# Patient Record
Sex: Male | Born: 1965 | Race: White | Hispanic: No | Marital: Single | Smoking: Current every day smoker
Health system: Southern US, Community
[De-identification: ages and names within clinical notes are randomized; demographics above are authoritative.]

## PROBLEM LIST (undated history)

## (undated) DIAGNOSIS — C801 Malignant (primary) neoplasm, unspecified: Secondary | ICD-10-CM

## (undated) DIAGNOSIS — M722 Plantar fascial fibromatosis: Secondary | ICD-10-CM

## (undated) HISTORY — DX: Plantar fascial fibromatosis: M72.2

---

## 1997-11-25 ENCOUNTER — Emergency Department (HOSPITAL_COMMUNITY): Admission: EM | Admit: 1997-11-25 | Discharge: 1997-11-25 | Payer: Self-pay

## 2002-07-10 ENCOUNTER — Encounter: Payer: Self-pay | Admitting: Emergency Medicine

## 2002-07-10 ENCOUNTER — Emergency Department (HOSPITAL_COMMUNITY): Admission: EM | Admit: 2002-07-10 | Discharge: 2002-07-10 | Payer: Self-pay | Admitting: Emergency Medicine

## 2013-04-29 ENCOUNTER — Encounter: Payer: Self-pay | Admitting: Family Medicine

## 2013-04-29 ENCOUNTER — Ambulatory Visit (INDEPENDENT_AMBULATORY_CARE_PROVIDER_SITE_OTHER): Payer: BC Managed Care – PPO | Admitting: Family Medicine

## 2013-04-29 ENCOUNTER — Encounter (INDEPENDENT_AMBULATORY_CARE_PROVIDER_SITE_OTHER): Payer: Self-pay

## 2013-04-29 VITALS — BP 122/76 | HR 68 | Temp 98.1°F | Ht 71.0 in | Wt 169.0 lb

## 2013-04-29 DIAGNOSIS — Z23 Encounter for immunization: Secondary | ICD-10-CM

## 2013-04-29 DIAGNOSIS — M79671 Pain in right foot: Secondary | ICD-10-CM

## 2013-04-29 DIAGNOSIS — Z0000 Encounter for general adult medical examination without abnormal findings: Secondary | ICD-10-CM

## 2013-04-29 DIAGNOSIS — L989 Disorder of the skin and subcutaneous tissue, unspecified: Secondary | ICD-10-CM

## 2013-04-29 LAB — POCT CBC
Granulocyte percent: 64.7 %G (ref 37–80)
HCT, POC: 43.7 % (ref 43.5–53.7)
Hemoglobin: 14.7 g/dL (ref 14.1–18.1)
Lymph, poc: 2.9 (ref 0.6–3.4)
MCH, POC: 30.5 pg (ref 27–31.2)
MCHC: 33.6 g/dL (ref 31.8–35.4)
MCV: 90.7 fL (ref 80–97)
MPV: 7.6 fL (ref 0–99.8)
POC Granulocyte: 5.8 (ref 2–6.9)
POC LYMPH PERCENT: 32.6 %L (ref 10–50)
Platelet Count, POC: 154 10*3/uL (ref 142–424)
RBC: 4.8 M/uL (ref 4.69–6.13)
RDW, POC: 13.2 %
WBC: 9 10*3/uL (ref 4.6–10.2)

## 2013-04-29 MED ORDER — NAPROXEN 500 MG PO TABS: 500.0000 mg | ORAL_TABLET | Freq: Two times a day (BID) | ORAL | Status: DC

## 2013-04-29 NOTE — Progress Notes (Signed)
Tolerated tdap without difficulty 

## 2013-04-29 NOTE — Patient Instructions (Signed)
Tetanus, Diphtheria (Td) Vaccine What You Need to Know WHY GET VACCINATED? Tetanus  and diphtheria are very serious diseases. They are rare in the [place] today, but people who do become infected often have severe complications. Td vaccine is used to protect adolescents and adults from both of these diseases. Both tetanus and diphtheria are infections caused by bacteria. Diphtheria spreads from person to person through coughing or sneezing. Tetanus-causing bacteria enter the body through cuts, scratches, or wounds. TETANUS (Lockjaw) causes painful muscle tightening and stiffness, usually all over the body.  It can lead to tightening of muscles in the head and neck so you can't open your mouth, swallow, or sometimes even breathe. Tetanus kills about 1 out of every 5 people who are infected. DIPHTHERIA can cause a thick coating to form in the back of the throat.  It can lead to breathing problems, paralysis, heart failure, and death. Before vaccines, the [place] saw as many as 200,000 cases a year of diphtheria and hundreds of cases of tetanus. Since vaccination began, cases of both diseases have dropped by about 99%. TD VACCINE Td vaccine can protect adolescents and adults from tetanus and diphtheria. Td is usually given as a booster dose every 10 years but it can also be given earlier after a severe and dirty wound or burn. Your doctor can give you more information. Td may safely be given at the same time as other vaccines. SOME PEOPLE SHOULD NOT GET THIS VACCINE  If you ever had a life-threatening allergic reaction after a dose of any tetanus or diphtheria containing vaccine, OR if you have a severe allergy to any part of this vaccine, you should not get Td. Tell your doctor if you have any severe allergies.  Talk to your doctor if you:  have epilepsy or another nervous system problem,  had severe pain or swelling after any vaccine containing diphtheria or tetanus,  ever had  Guillain Barr Syndrome (GBS),  aren't feeling well on the day the shot is scheduled. RISKS OF A VACCINE REACTION With a vaccine, like any medicine, there is a chance of side effects. These are usually mild and go away on their own. Serious side effects are also possible, but are very rare. Most people who get Td vaccine do not have any problems with it. Mild Problems  following Td (Did not interfere with activities)  Pain where the shot was given (about 8 people in 10)  Redness or swelling where the shot was given (about 1 person in 3)  Mild fever (about 1 person in 15)  Headache or Tiredness (uncommon) Moderate Problems following Td (Interfered with activities, but did not require medical attention)  Fever over 102 F (38.9 C) (rare) Severe Problems  following Td (Unable to perform usual activities; required medical attention)  Swelling, severe pain, bleeding, or redness in the arm where the shot was given (rare). Problems that could happen after any vaccine:  Brief fainting spells can happen after any medical procedure, including vaccination. Sitting or lying down for about 15 minutes can help prevent fainting, and injuries caused by a fall. Tell your doctor if you feel dizzy, or have vision changes or ringing in the ears.  Severe shoulder pain and reduced range of motion in the arm where a shot was given can happen, very rarely, after a vaccination.  Severe allergic reactions from a vaccine are very rare, estimated at less than 1 in a million doses. If one were to occur, it would   usually be within a few minutes to a few hours after the vaccination. WHAT IF THERE IS A SERIOUS REACTION? What should I look for?  Look for anything that concerns you, such as signs of a severe allergic reaction, very high fever, or behavior changes. Signs of a severe allergic reaction can include hives, swelling of the face and throat, difficulty breathing, a fast heartbeat, dizziness, and  weakness. These would usually start a few minutes to a few hours after the vaccination. What should I do?  If you think it is a severe allergic reaction or other emergency that can't wait, call 911 or get the person to the nearest hospital. Otherwise, call your doctor.  Afterward, the reaction should be reported to the Vaccine Adverse Event Reporting System (VAERS). Your doctor might file this report, or, you can do it yourself through the VAERS website or by calling 1-800-822-7967. VAERS is only for reporting reactions. They do not give medical advice. THE NATIONAL VACCINE INJURY COMPENSATION PROGRAM The National Vaccine Injury Compensation Program (VICP) is a federal program that was created to compensate people who may have been injured by certain vaccines. Persons who believe they may have been injured by a vaccine can learn about the program and about filing a claim by calling 1-800-338-2382 or visiting the VICP website. HOW CAN I LEARN MORE?  Ask your doctor.  Contact your local or state health department.  Contact the Centers for Disease Control and Prevention (CDC):  Call 1-800-232-4636 (1-800-CDC-INFO)  Visit CDC's vaccines website CDC Td Vaccine Interim VIS (07/17/12) Document Released: 03/27/2006 Document Revised: 09/24/2012 Document Reviewed: 09/19/2012 Exit[employer] Patient Information 2014 [employer].  
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
## 2013-04-30 LAB — LIPID PANEL
Chol/HDL Ratio: 4 ratio units (ref 0.0–5.0)
Cholesterol, Total: 223 mg/dL — ABNORMAL HIGH (ref 100–199)
HDL: 56 mg/dL (ref >39)
LDL Calculated: 143 mg/dL — ABNORMAL HIGH (ref 0–99)
Triglycerides: 121 mg/dL (ref 0–149)
VLDL Cholesterol Cal: 24 mg/dL (ref 5–40)

## 2013-04-30 LAB — CMP14+EGFR
ALT: 15 IU/L (ref 0–44)
AST: 17 IU/L (ref 0–40)
Albumin/Globulin Ratio: 2.2 (ref 1.1–2.5)
Albumin: 4.8 g/dL (ref 3.5–5.5)
Alkaline Phosphatase: 75 IU/L (ref 39–117)
BUN/Creatinine Ratio: 17 (ref 9–20)
BUN: 17 mg/dL (ref 6–24)
CO2: 28 mmol/L (ref 18–29)
Calcium: 9.7 mg/dL (ref 8.7–10.2)
Chloride: 99 mmol/L (ref 97–108)
Creatinine, Ser: 0.99 mg/dL (ref 0.76–1.27)
GFR calc Af Amer: 104 mL/min/{1.73_m2} (ref >59)
GFR calc non Af Amer: 90 mL/min/{1.73_m2} (ref >59)
Globulin, Total: 2.2 g/dL (ref 1.5–4.5)
Glucose: 78 mg/dL (ref 65–99)
Potassium: 4.2 mmol/L (ref 3.5–5.2)
Sodium: 142 mmol/L (ref 134–144)
Total Bilirubin: 0.2 mg/dL (ref 0.0–1.2)
Total Protein: 7 g/dL (ref 6.0–8.5)

## 2013-04-30 LAB — PSA, TOTAL AND FREE
PSA, Free Pct: 16.7 %
PSA, Free: 0.25 ng/mL
PSA: 1.5 ng/mL (ref 0.0–4.0)

## 2013-04-30 LAB — THYROID PANEL WITH TSH
Free Thyroxine Index: 2.1 (ref 1.2–4.9)
T3 Uptake Ratio: 28 % (ref 24–39)
T4, Total: 7.4 ug/dL (ref 4.5–12.0)
TSH: 1.19 u[IU]/mL (ref 0.450–4.500)

## 2013-05-01 ENCOUNTER — Other Ambulatory Visit (INDEPENDENT_AMBULATORY_CARE_PROVIDER_SITE_OTHER): Payer: BC Managed Care – PPO

## 2013-05-01 DIAGNOSIS — Z1212 Encounter for screening for malignant neoplasm of rectum: Secondary | ICD-10-CM

## 2013-05-02 LAB — FECAL OCCULT BLOOD, IMMUNOCHEMICAL: Fecal Occult Bld: NEGATIVE

## 2013-05-14 ENCOUNTER — Telehealth: Payer: Self-pay | Admitting: Family Medicine

## 2013-05-14 NOTE — Telephone Encounter (Signed)
Left message to call back regarding lab work.

## 2013-05-14 NOTE — Telephone Encounter (Signed)
Message copied by Azalee Course on Tue May 14, 2013 10:53 AM ------      Message from: Deatra Canter      Created: Wed May 01, 2013  9:28 AM       Recommend taking statin rx for elevated lipids ------

## 2013-05-14 NOTE — Telephone Encounter (Signed)
Patient aware.

## 2015-03-27 ENCOUNTER — Encounter: Payer: Self-pay | Admitting: Family Medicine

## 2015-03-27 ENCOUNTER — Ambulatory Visit (INDEPENDENT_AMBULATORY_CARE_PROVIDER_SITE_OTHER): Payer: 59 | Admitting: Family Medicine

## 2015-03-27 VITALS — BP 121/77 | HR 63 | Temp 98.2°F | Ht 71.0 in | Wt 150.4 lb

## 2015-03-27 DIAGNOSIS — Z23 Encounter for immunization: Secondary | ICD-10-CM

## 2015-03-27 DIAGNOSIS — M722 Plantar fascial fibromatosis: Secondary | ICD-10-CM

## 2015-03-27 DIAGNOSIS — S39012A Strain of muscle, fascia and tendon of lower back, initial encounter: Secondary | ICD-10-CM | POA: Diagnosis not present

## 2015-03-27 MED ORDER — MELOXICAM 7.5 MG PO TABS: 7.5000 mg | ORAL_TABLET | Freq: Every day | ORAL | Status: DC

## 2015-03-27 MED ORDER — CYCLOBENZAPRINE HCL 10 MG PO TABS: 10.0000 mg | ORAL_TABLET | Freq: Three times a day (TID) | ORAL | Status: DC | PRN

## 2015-03-27 NOTE — Progress Notes (Signed)
BP 121/77 mmHg  Pulse 63  Temp(Src) 98.2 F (36.8 C) (Oral)  Ht 5\' 11"  (1.803 m)  Wt 150 lb 6.4 oz (68.221 kg)  BMI 20.99 kg/m2   Subjective:    Patient ID: Jacob Reynolds, male    DOB: [birth year], 48 y.o.   MRN: [id number]  HPI: Jacob Reynolds is a 49 y.o. male presenting on 03/27/2015 for Back Pain and Foot Pain   HPI Foot pain Patient has foot pain at the base of his right heel that he describes as a sharp stabbing pain. Pain is 5 out of 10. He has never had this pain before and does not know where it came from. He does admit to having flat feet. There is no redness or warmth in the area and he denies any fevers or chills.  Low back pain Patient has left low back pain that started over the past couple days, he does not know if it's related to his fall that happened one month ago. His pain didn't get better after the fall but then has come back over the past couple days. He has had a back surgery before and disc problems. He denies any pain radiating down his legs or anywhere else.  Relevant past medical, surgical, family and social history reviewed and updated as indicated. Interim medical history since our last visit reviewed. Allergies and medications reviewed and updated.  Review of Systems  Constitutional: Negative for fever.  HENT: Negative for ear discharge and ear pain.   Eyes: Negative for discharge and visual disturbance.  Respiratory: Negative for shortness of breath and wheezing.   Cardiovascular: Negative for chest pain and leg swelling.  Gastrointestinal: Negative for abdominal pain, diarrhea and constipation.  Genitourinary: Negative for difficulty urinating.  Musculoskeletal: Positive for back pain and arthralgias (right foot). Negative for gait problem.  Skin: Negative for rash.  Neurological: Negative for syncope, light-headedness and headaches.  All other systems reviewed and are negative.   Per HPI unless specifically indicated above     Medication  List       This list is accurate as of: 03/27/15  3:02 PM.  Always use your most recent med list.               cyclobenzaprine 10 MG tablet  Commonly known as:  FLEXERIL  Take 1 tablet (10 mg total) by mouth 3 (three) times daily as needed for muscle spasms.     meloxicam 7.5 MG tablet  Commonly known as:  MOBIC  Take 1 tablet (7.5 mg total) by mouth daily.           Objective:    BP 121/77 mmHg  Pulse 63  Temp(Src) 98.2 F (36.8 C) (Oral)  Ht 5\' 11"  (1.803 m)  Wt 150 lb 6.4 oz (68.221 kg)  BMI 20.99 kg/m2  Wt Readings from Last 3 Encounters:  03/27/15 150 lb 6.4 oz (68.221 kg)  04/29/13 169 lb (76.658 kg)    Physical Exam  Constitutional: He is oriented to person, place, and time. He appears well-developed and well-nourished. No distress.  Eyes: Conjunctivae and EOM are normal. Pupils are equal, round, and reactive to light. Right eye exhibits no discharge. No scleral icterus.  Cardiovascular: Normal rate, regular rhythm, normal heart sounds and intact distal pulses.   No murmur heard. Pulmonary/Chest: Effort normal and breath sounds normal. No respiratory distress. He has no wheezes.  Abdominal: He exhibits no distension.  Musculoskeletal: Normal range of motion. He exhibits tenderness (  left lumbar tenderness, mild). He exhibits no edema.       Left foot: There is tenderness (Tenderness under the base of the right heel and along the plantar fascia anteriorly). There is normal range of motion, no swelling and no laceration.  Neurological: He is alert and oriented to person, place, and time. Coordination normal.  Skin: Skin is warm and dry. No rash noted. He is not diaphoretic. No erythema.  Psychiatric: He has a normal mood and affect. His behavior is normal.  Vitals reviewed.   Results for orders placed or performed in visit on 05/01/13  Fecal occult blood, imunochemical  Result Value Ref Range   Fecal Occult Bld Negative Negative      Assessment & Plan:    Problem List Items Addressed This Visit    None    Visit Diagnoses    Plantar fasciitis of right foot    -  Primary    We'll try Mobic and ice and using a water bottle that is frozen under his foot and rolling it.    Relevant Medications    meloxicam (MOBIC) 7.5 MG tablet    Lumbar strain, initial encounter        Likely muscular will try Flexeril and Mobic first.    Relevant Medications    meloxicam (MOBIC) 7.5 MG tablet    cyclobenzaprine (FLEXERIL) 10 MG tablet    Encounter for immunization            Follow up plan: Return in about 4 weeks (around 04/24/2015), or if symptoms worsen or fail to improve, for well adult exam.  Caryl Pina, MD [facility] 03/27/2015, 3:02 PM

## 2015-04-06 ENCOUNTER — Telehealth: Payer: Self-pay | Admitting: Family Medicine

## 2015-04-06 NOTE — Telephone Encounter (Signed)
Contacted [facility] and had both prescriptions, Flexeril and Mobic cancelled.  Called medications in to [facility] [place]: Flexeril 10 mg, one PO TID, #40 with no refills Mobic 7.5 mg, one PO QD, #30 with no refills  Left info on voicemail Patient was informed by [facility]

## 2015-08-28 ENCOUNTER — Encounter: Payer: Self-pay | Admitting: Family Medicine

## 2015-08-28 ENCOUNTER — Ambulatory Visit (INDEPENDENT_AMBULATORY_CARE_PROVIDER_SITE_OTHER): Payer: BLUE CROSS/BLUE SHIELD | Admitting: Family Medicine

## 2015-08-28 VITALS — BP 111/69 | HR 66 | Temp 98.4°F | Ht 71.0 in | Wt 159.0 lb

## 2015-08-28 DIAGNOSIS — L850 Acquired ichthyosis: Secondary | ICD-10-CM

## 2015-08-28 DIAGNOSIS — M5441 Lumbago with sciatica, right side: Secondary | ICD-10-CM

## 2015-08-28 DIAGNOSIS — H539 Unspecified visual disturbance: Secondary | ICD-10-CM

## 2015-08-28 DIAGNOSIS — Z13220 Encounter for screening for lipoid disorders: Secondary | ICD-10-CM | POA: Diagnosis not present

## 2015-08-28 DIAGNOSIS — L853 Xerosis cutis: Secondary | ICD-10-CM

## 2015-08-28 DIAGNOSIS — Z131 Encounter for screening for diabetes mellitus: Secondary | ICD-10-CM | POA: Diagnosis not present

## 2015-08-28 MED ORDER — CYCLOBENZAPRINE HCL 10 MG PO TABS: 10.0000 mg | ORAL_TABLET | Freq: Three times a day (TID) | ORAL | Status: DC | PRN

## 2015-08-28 NOTE — Progress Notes (Signed)
BP 111/69 mmHg  Pulse 66  Temp(Src) 98.4 F (36.9 C) (Oral)  Ht 5' 11"  (1.803 m)  Wt 159 lb (72.122 kg)  BMI 22.19 kg/m2   Subjective:    Patient ID: Jacob Reynolds, male    DOB: [birth year], 50 y.o.   MRN: [id number]  HPI: [name] is a 50 y.o. male presenting on 08/28/2015 for Orthopaedic referral and Cracking, dry hands   HPI Low back pain Patient is coming in today with persistent low back pain in the midline and on the right lower lumbar with intermittent sciatic pain going down the back of his right thigh. He denies any numbness or weakness in either leg. He has tried both Mobic and Flexeril over the past month and feels that the Flexeril helped some. He is still having it would like to go see an orthopedic.  Visual disturbance Patient is coming in complaining as well of visual disturbance that is been worsening over the past year. He feels like everything is been blurring is having more difficulty reading. He would like to go see somebody to get it examined. He denies any pain or redness or pressure in his eyes.  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 He is not using any kind of moisturizer currently.  Relevant past medical, surgical, family and social history reviewed and updated as indicated. Interim medical history since our last visit reviewed. Allergies and medications reviewed and updated.  Review of Systems  Constitutional: Negative for fever.  HENT: Negative for ear discharge and ear pain.   Eyes: Positive for visual disturbance. Negative for discharge.  Respiratory: Negative for shortness of breath and wheezing.   Cardiovascular: Negative for chest pain and leg swelling.  Gastrointestinal: Negative for abdominal pain, diarrhea and constipation.  Genitourinary: Negative for difficulty urinating.  Musculoskeletal: Positive for back  pain. Negative for gait problem.  Skin: Positive for rash.  Neurological: Negative for dizziness, syncope, weakness, light-headedness, numbness and headaches.  All other systems reviewed and are negative.   Per HPI unless specifically indicated above     Medication List       This list is accurate as of: 08/28/15  3:01 PM.  Always use your most recent med list.               cyclobenzaprine 10 MG tablet  Commonly known as:  FLEXERIL  Take 1 tablet (10 mg total) by mouth 3 (three) times daily as needed for muscle spasms.           Objective:    BP 111/69 mmHg  Pulse 66  Temp(Src) 98.4 F (36.9 C) (Oral)  Ht 5' 11"  (1.803 m)  Wt 159 lb (72.122 kg)  BMI 22.19 kg/m2  Wt Readings from Last 3 Encounters:  08/28/15 159 lb (72.122 kg)  03/27/15 150 lb 6.4 oz (68.221 kg)  04/29/13 169 lb (76.658 kg)    Physical Exam  Constitutional: He is oriented to person, place, and time. He appears well-developed and well-nourished. No distress.  Eyes: Conjunctivae and EOM are normal. Pupils are equal, round, and reactive to light. Right eye exhibits no discharge. Left eye exhibits no discharge. No scleral icterus.  Neck: Neck supple. No thyromegaly present.  Cardiovascular: Normal rate, regular rhythm, normal heart sounds and intact distal pulses.   No murmur heard. Pulmonary/Chest: Effort  normal and breath sounds normal. No respiratory distress. He has no wheezes.  Musculoskeletal: Normal range of motion. He exhibits tenderness (Negative straight leg raise, tenderness in bilateral lumbar pain and over L2-L3 midline.). He exhibits no edema.  Lymphadenopathy:    He has no cervical adenopathy.  Neurological: He is alert and oriented to person, place, and time. Coordination normal.  Skin: Skin is warm and dry. No rash noted. He is not diaphoretic.  Psychiatric: He has a normal mood and affect. His behavior is normal.  Vitals reviewed.   Results for orders placed or performed in visit  on 05/01/13  Fecal occult blood, imunochemical  Result Value Ref Range   Fecal Occult Bld Negative Negative      Assessment & Plan:   Problem List Items Addressed This Visit    None    Visit Diagnoses    Midline low back pain with right-sided sciatica    -  Primary    Negative straight leg raise, patient would like to go see orthopedic, will also send the PT, refill Flexeril    Relevant Medications    cyclobenzaprine (FLEXERIL) 10 MG tablet    Other Relevant Orders    Ambulatory referral to Physical Therapy    Ambulatory referral to Orthopedic Surgery    Visual disturbance        Patient has worsening vision over the past year and wants to get his eyes checked. He denies any pain or pressure    Relevant Orders    Ambulatory referral to Ophthalmology    Screening for diabetes mellitus (DM)        Relevant Orders    CMP14+EGFR    Screening, lipid        Relevant Orders    Lipid panel    Dry skin dermatitis        Recommended using Vaseline or Cerave.        Follow up plan: Return if symptoms worsen or fail to improve.  Counseling provided for all of the vaccine components Orders Placed This Encounter  Procedures  . CMP14+EGFR  . Lipid panel  . Ambulatory referral to Physical Therapy  . Ambulatory referral to Ophthalmology  . Ambulatory referral to [facility] Shavone Nevers, MD [facility] 08/28/2015, 3:01 PM

## 2015-09-05 ENCOUNTER — Other Ambulatory Visit: Payer: BLUE CROSS/BLUE SHIELD

## 2015-09-05 DIAGNOSIS — Z13220 Encounter for screening for lipoid disorders: Secondary | ICD-10-CM

## 2015-09-05 DIAGNOSIS — Z131 Encounter for screening for diabetes mellitus: Secondary | ICD-10-CM

## 2015-09-05 DIAGNOSIS — R739 Hyperglycemia, unspecified: Secondary | ICD-10-CM

## 2015-09-06 LAB — CMP14+EGFR
ALBUMIN: 4.3 g/dL (ref 3.5–5.5)
ALT: 26 IU/L (ref 0–44)
AST: 21 IU/L (ref 0–40)
Albumin/Globulin Ratio: 1.4 (ref 1.2–2.2)
Alkaline Phosphatase: 93 IU/L (ref 39–117)
BUN / CREAT RATIO: 21 — AB (ref 9–20)
BUN: 18 mg/dL (ref 6–24)
Bilirubin Total: 0.5 mg/dL (ref 0.0–1.2)
CALCIUM: 9.3 mg/dL (ref 8.7–10.2)
CO2: 22 mmol/L (ref 18–29)
CREATININE: 0.84 mg/dL (ref 0.76–1.27)
Chloride: 103 mmol/L (ref 96–106)
GFR, EST AFRICAN AMERICAN: 119 mL/min/{1.73_m2} (ref >59)
GFR, EST NON AFRICAN AMERICAN: 103 mL/min/{1.73_m2} (ref >59)
Globulin, Total: 3.1 g/dL (ref 1.5–4.5)
Glucose: 105 mg/dL — ABNORMAL HIGH (ref 65–99)
POTASSIUM: 4.5 mmol/L (ref 3.5–5.2)
Sodium: 140 mmol/L (ref 134–144)
TOTAL PROTEIN: 7.4 g/dL (ref 6.0–8.5)

## 2015-09-06 LAB — LIPID PANEL
CHOLESTEROL TOTAL: 207 mg/dL — AB (ref 100–199)
Chol/HDL Ratio: 3.7 ratio units (ref 0.0–5.0)
HDL: 56 mg/dL (ref >39)
LDL Calculated: 136 mg/dL — ABNORMAL HIGH (ref 0–99)
TRIGLYCERIDES: 76 mg/dL (ref 0–149)
VLDL Cholesterol Cal: 15 mg/dL (ref 5–40)

## 2015-09-08 LAB — BAYER DCA HB A1C WAIVED: HB A1C: 5.4 % (ref <7.0)

## 2015-10-13 ENCOUNTER — Encounter (INDEPENDENT_AMBULATORY_CARE_PROVIDER_SITE_OTHER): Payer: Self-pay

## 2015-10-13 ENCOUNTER — Ambulatory Visit (INDEPENDENT_AMBULATORY_CARE_PROVIDER_SITE_OTHER): Payer: BLUE CROSS/BLUE SHIELD | Admitting: Family Medicine

## 2015-10-13 ENCOUNTER — Other Ambulatory Visit: Payer: Self-pay

## 2015-10-13 ENCOUNTER — Ambulatory Visit (HOSPITAL_COMMUNITY)
Admission: RE | Admit: 2015-10-13 | Discharge: 2015-10-13 | Disposition: A | Discharge status: Home / Self Care | Payer: Self-pay | Source: Ambulatory Visit | Attending: Family Medicine | Admitting: Family Medicine

## 2015-10-13 ENCOUNTER — Encounter: Payer: Self-pay | Admitting: Family Medicine

## 2015-10-13 VITALS — BP 129/84 | HR 61 | Temp 98.1°F | Ht 71.0 in | Wt 158.4 lb

## 2015-10-13 DIAGNOSIS — N50812 Left testicular pain: Secondary | ICD-10-CM

## 2015-10-13 DIAGNOSIS — R319 Hematuria, unspecified: Secondary | ICD-10-CM | POA: Diagnosis not present

## 2015-10-13 DIAGNOSIS — N451 Epididymitis: Secondary | ICD-10-CM

## 2015-10-13 DIAGNOSIS — N433 Hydrocele, unspecified: Secondary | ICD-10-CM | POA: Insufficient documentation

## 2015-10-13 DIAGNOSIS — N5082 Scrotal pain: Secondary | ICD-10-CM

## 2015-10-13 LAB — MICROSCOPIC EXAMINATION: Bacteria, UA: NONE SEEN

## 2015-10-13 LAB — URINALYSIS, COMPLETE
Bilirubin, UA: NEGATIVE
Glucose, UA: NEGATIVE
Ketones, UA: NEGATIVE
Nitrite, UA: NEGATIVE
PH UA: 6 (ref 5.0–7.5)
Specific Gravity, UA: 1.02 (ref 1.005–1.030)
Urobilinogen, Ur: 0.2 mg/dL (ref 0.2–1.0)

## 2015-10-13 MED ORDER — CEFTRIAXONE SODIUM 1 G IJ SOLR
250.0000 mg | INTRAMUSCULAR | Status: AC
  Administered 2015-10-13: 250 mg via INTRAMUSCULAR

## 2015-10-13 MED ORDER — DOXYCYCLINE HYCLATE 100 MG PO TABS: 100.0000 mg | ORAL_TABLET | Freq: Two times a day (BID) | ORAL | Status: DC

## 2015-10-13 NOTE — Addendum Note (Signed)
Addended by: Caryl Pina on: 10/13/2015 11:03 AM   Modules accepted: Orders

## 2015-10-13 NOTE — Progress Notes (Addendum)
BP 129/84 mmHg  Pulse 61  Temp(Src) 98.1 F (36.7 C) (Oral)  Ht 5\' 11"  (1.803 m)  Wt 158 lb 6.4 oz (71.85 kg)  BMI 22.10 kg/m2   Subjective:    Patient ID: Jacob Reynolds, male    DOB: 1965-07-12, 50 y.o.   MRN: [id number]  HPI: Jacob Reynolds is a 51 y.o. male presenting on 10/13/2015 for Bleeding with ejaculation   HPI Left testicular pain Patient comes in today with left testicular pain and bleeding while ejaculating. He denies any fevers or chills or pain going anywhere else. He feels like he has pain in his left testicle and that is swollen and maybe even twisted. This all started yesterday evening. His girlfriend has had UTIs and yeast infection recently but no STDs that he knows of. He denies any nausea or vomiting. He rates the pain as 9 out of 10.  Relevant past medical, surgical, family and social history reviewed and updated as indicated. Interim medical history since our last visit reviewed. Allergies and medications reviewed and updated.  Review of Systems  Constitutional: Negative for fever.  HENT: Negative for ear discharge and ear pain.   Eyes: Negative for discharge and visual disturbance.  Respiratory: Negative for shortness of breath and wheezing.   Cardiovascular: Negative for chest pain and leg swelling.  Gastrointestinal: Negative for abdominal pain, diarrhea and constipation.  Genitourinary: Positive for discharge (Blood with ejaculation), scrotal swelling and testicular pain. Negative for dysuria, urgency, frequency, hematuria, flank pain, penile swelling, difficulty urinating and penile pain.  Musculoskeletal: Negative for back pain and gait problem.  Skin: Negative for rash.  Neurological: Negative for syncope, light-headedness and headaches.  All other systems reviewed and are negative.   Per HPI unless specifically indicated above     Medication List       This list is accurate as of: 10/13/15  8:51 AM.  Always use your most recent med list.                 cyclobenzaprine 10 MG tablet  Commonly known as:  FLEXERIL  Take 1 tablet (10 mg total) by mouth 3 (three) times daily as needed for muscle spasms.           Objective:    BP 129/84 mmHg  Pulse 61  Temp(Src) 98.1 F (36.7 C) (Oral)  Ht 5\' 11"  (1.803 m)  Wt 158 lb 6.4 oz (71.85 kg)  BMI 22.10 kg/m2  Wt Readings from Last 3 Encounters:  10/13/15 158 lb 6.4 oz (71.85 kg)  08/28/15 159 lb (72.122 kg)  03/27/15 150 lb 6.4 oz (68.221 kg)    Physical Exam  Constitutional: He is oriented to person, place, and time. He appears well-developed and well-nourished. No distress.  Eyes: Conjunctivae and EOM are normal. Pupils are equal, round, and reactive to light. Right eye exhibits no discharge. No scleral icterus.  Cardiovascular: Normal rate, regular rhythm, normal heart sounds and intact distal pulses.   No murmur heard. Pulmonary/Chest: Effort normal and breath sounds normal. No respiratory distress. He has no wheezes.  Abdominal: Soft. Bowel sounds are normal. He exhibits no distension. There is no tenderness. There is no rebound.  Genitourinary: Penis normal. Right testis shows no swelling and no tenderness. Right testis is descended. Cremasteric reflex is not absent on the right side. Left testis shows swelling and tenderness. Left testis is descended. Cremasteric reflex is not absent on the left side. Circumcised.  Musculoskeletal: Normal range of motion. He  exhibits no edema.  Neurological: He is alert and oriented to person, place, and time. Coordination normal.  Skin: Skin is warm and dry. No rash noted. He is not diaphoretic.  Psychiatric: He has a normal mood and affect. His behavior is normal.  Vitals reviewed.   Urinalysis: Greater than 30 RBCs and 6-10 WBCs and no bacteria noted    Assessment & Plan:   Problem List Items Addressed This Visit    None    Visit Diagnoses    Hematuria    -  Primary    Relevant Orders    Urinalysis, Complete    US  Scrotum    GC/Chlamydia Probe Amp    Left testicular pain        Relevant Orders    US Scrotum    GC/Chlamydia Probe Amp       Follow up plan: Return if symptoms worsen or fail to improve.  Counseling provided for all of the vaccine components Orders Placed This Encounter  Procedures  . GC/Chlamydia Probe Amp  . US Scrotum  . Urinalysis, Complete    Caryl Pina, MD [facility] 10/13/2015, 8:51 AM  Addendum: Ultrasound of the scrotum shows epididymitis on the left side, will treat for gonorrhea and chlamydia as they are the biggest possible sources of that. Ultrasound showed no signs of torsion. We'll have come in for Rocephin 250 mg injection and sent doxycycline 100 twice a day for 10 days.

## 2015-10-15 LAB — GC/CHLAMYDIA PROBE AMP
CHLAMYDIA, DNA PROBE: NEGATIVE
Neisseria gonorrhoeae by PCR: NEGATIVE

## 2015-12-24 ENCOUNTER — Encounter: Payer: Self-pay | Admitting: Family Medicine

## 2015-12-24 ENCOUNTER — Ambulatory Visit (INDEPENDENT_AMBULATORY_CARE_PROVIDER_SITE_OTHER): Payer: Self-pay | Admitting: Family Medicine

## 2015-12-24 VITALS — BP 104/69 | HR 62 | Temp 97.1°F | Ht 71.0 in | Wt 145.4 lb

## 2015-12-24 DIAGNOSIS — Z202 Contact with and (suspected) exposure to infections with a predominantly sexual mode of transmission: Secondary | ICD-10-CM

## 2015-12-24 MED ORDER — METRONIDAZOLE 500 MG PO TABS: 500.0000 mg | ORAL_TABLET | Freq: Two times a day (BID) | ORAL | Status: DC

## 2015-12-24 NOTE — Progress Notes (Signed)
BP 104/69 mmHg  Pulse 62  Temp(Src) 97.1 F (36.2 C) (Oral)  Ht 5\' 11"  (1.803 m)  Wt 145 lb 6.4 oz (65.953 kg)  BMI 20.29 kg/m2  SpO2 97%   Subjective:    Patient ID: Jacob Reynolds, male    DOB: 1965/07/10, 50 y.o.   MRN: [id number]  HPI: AARIN LANGFITT is a 50 y.o. male presenting on 12/24/2015 for std check   HPI STD exposure Patient was exposed to STDs and specifically trichomonas by his friend. She was treated 3 weeks ago for Trichomonas. He denies any penile discharge or dysuria or testicular pain or tenderness. He denies any fevers or chills or abdominal pain or flank pain.  Relevant past medical, surgical, family and social history reviewed and updated as indicated. Interim medical history since our last visit reviewed. Allergies and medications reviewed and updated.  Review of Systems  Constitutional: Negative for fever.  HENT: Negative for ear discharge and ear pain.   Eyes: Negative for discharge and visual disturbance.  Respiratory: Negative for shortness of breath and wheezing.   Cardiovascular: Negative for chest pain and leg swelling.  Gastrointestinal: Negative for abdominal pain, diarrhea and constipation.  Genitourinary: Negative for dysuria, hematuria, discharge and difficulty urinating.  Musculoskeletal: Negative for back pain and gait problem.  Skin: Negative for rash.  Neurological: Negative for syncope, light-headedness and headaches.  All other systems reviewed and are negative.   Per HPI unless specifically indicated above     Medication List       This list is accurate as of: 12/24/15  4:04 PM.  Always use your most recent med list.               metroNIDAZOLE 500 MG tablet  Commonly known as:  FLAGYL  Take 1 tablet (500 mg total) by mouth 2 (two) times daily.           Objective:    BP 104/69 mmHg  Pulse 62  Temp(Src) 97.1 F (36.2 C) (Oral)  Ht 5\' 11"  (1.803 m)  Wt 145 lb 6.4 oz (65.953 kg)  BMI 20.29 kg/m2  SpO2 97%    Wt Readings from Last 3 Encounters:  12/24/15 145 lb 6.4 oz (65.953 kg)  10/13/15 158 lb 6.4 oz (71.85 kg)  08/28/15 159 lb (72.122 kg)    Physical Exam  Constitutional: He is oriented to person, place, and time. He appears well-developed and well-nourished. No distress.  Eyes: Conjunctivae and EOM are normal. Pupils are equal, round, and reactive to light. Right eye exhibits no discharge. No scleral icterus.  Neck: Neck supple. No thyromegaly present.  Cardiovascular: Normal rate, regular rhythm, normal heart sounds and intact distal pulses.   No murmur heard. Pulmonary/Chest: Effort normal and breath sounds normal. No respiratory distress. He has no wheezes.  Genitourinary:  Declined exam because is no issues currently  Musculoskeletal: Normal range of motion. He exhibits no edema.  Lymphadenopathy:    He has no cervical adenopathy.  Neurological: He is alert and oriented to person, place, and time. Coordination normal.  Skin: Skin is warm and dry. No rash noted. He is not diaphoretic.  Psychiatric: He has a normal mood and affect. His behavior is normal.  Nursing note and vitals reviewed.       Assessment & Plan:   Problem List Items Addressed This Visit    None    Visit Diagnoses    Exposure to trichomonas    -  Primary  Relevant Medications    metroNIDAZOLE (FLAGYL) 500 MG tablet        Follow up plan: Return if symptoms worsen or fail to improve.  Counseling provided for all of the vaccine components No orders of the defined types were placed in this encounter.    Caryl Pina, MD [facility] 12/24/2015, 4:04 PM

## 2016-01-02 ENCOUNTER — Other Ambulatory Visit: Payer: Self-pay | Admitting: Family Medicine

## 2016-04-08 ENCOUNTER — Ambulatory Visit (INDEPENDENT_AMBULATORY_CARE_PROVIDER_SITE_OTHER): Payer: Self-pay | Admitting: Nurse Practitioner

## 2016-04-08 ENCOUNTER — Ambulatory Visit (INDEPENDENT_AMBULATORY_CARE_PROVIDER_SITE_OTHER): Payer: Self-pay

## 2016-04-08 ENCOUNTER — Encounter: Payer: Self-pay | Admitting: Nurse Practitioner

## 2016-04-08 VITALS — BP 119/74 | HR 54 | Temp 97.8°F | Ht 71.0 in | Wt 142.0 lb

## 2016-04-08 DIAGNOSIS — M7918 Myalgia, other site: Secondary | ICD-10-CM

## 2016-04-08 DIAGNOSIS — M791 Myalgia: Secondary | ICD-10-CM

## 2016-04-08 MED ORDER — CYCLOBENZAPRINE HCL 10 MG PO TABS: 10.0000 mg | ORAL_TABLET | Freq: Three times a day (TID) | ORAL | 1 refills | Status: DC | PRN

## 2016-04-08 MED ORDER — NAPROXEN 500 MG PO TABS: 500.0000 mg | ORAL_TABLET | Freq: Two times a day (BID) | ORAL | 1 refills | Status: DC

## 2016-04-08 NOTE — Progress Notes (Signed)
   Subjective:    Patient ID: Jacob Reynolds, male    DOB: [birth year], 50 y.o.   MRN: [id number]  HPI Patient in c/o back pain- he was mowing yard a week ago and was pulling lawn mower backwards and he fell and landed on a rock on his butt. Hurts to sit. Standing decreases pain. He has had back pain for many years but this pain is different. He rates pain 7/10 right now.    Review of Systems  Constitutional: Negative.   HENT: Negative.   Respiratory: Negative.   Cardiovascular: Negative.   Gastrointestinal: Negative.   Musculoskeletal: Positive for back pain.  Neurological: Negative.  Negative for weakness.  Psychiatric/Behavioral: Negative.   All other systems reviewed and are negative.      Objective:   Physical Exam  Constitutional: He is oriented to person, place, and time. He appears well-developed and well-nourished. No distress.  Cardiovascular: Normal rate, regular rhythm and normal heart sounds.   Pulmonary/Chest: Effort normal and breath sounds normal.  Musculoskeletal:  FROM of lumbar spine with pain on flexion and rotation (-) SLR bil Motor strength and sensation distally intact  Neurological: He is alert and oriented to person, place, and time. He has normal reflexes.  Skin: Skin is warm.  Psychiatric: He has a normal mood and affect. His behavior is normal. Judgment and thought content normal.   BP 119/74   Pulse (!) 54   Temp 97.8 F (36.6 C) (Oral)   Ht 5\' 11"  (1.803 m)   Wt 142 lb (64.4 kg)   BMI 19.80 kg/m   Lumbar x ray- possible slight displacement of coccyx bone-Preliminary reading by Ronnald Collum, FNP  [facility]       Assessment & Plan:  1. Buttock pain Sit on donut or cushion Moist heat RTO prn - DG Lumbar Spine 2-3 Views; Future - cyclobenzaprine (FLEXERIL) 10 MG tablet; Take 1 tablet (10 mg total) by mouth 3 (three) times daily as needed for muscle spasms.  Dispense: 30 tablet; Refill: 1 - naproxen (NAPROSYN) 500 MG tablet; Take 1 tablet (500  mg total) by mouth 2 (two) times daily with a meal.  Dispense: 60 tablet; Refill: [place], FNP

## 2016-04-08 NOTE — Patient Instructions (Signed)
Tailbone Injury The tailbone is the small bone at the lower end of the backbone (spine). You may have stretched tissues, bruises, or a broken bone (fracture). These injuries can be painful. Most tailbone injuries get better on their own in 4-6 weeks. HOME CARE  Take medicines only as told by your doctor.  If told, apply ice to the injured area.  Put ice in a plastic bag.  Place a towel between your skin and the bag.  Leave the ice on for 20 minutes, 2-3 times per day. Do this for the first 1-2 days.  Sit on a large, rubber or inflated ring or cushion to lessen pain. Lean forward when you sit to help lessen pain.  Avoid sitting in one place for a long time.  Increase your activity as the pain allows.  Do exercises as told by your doctor or physical therapist.  If it is painful to poop, take medicine to help you poop (stool softeners) as told by your doctor.  Eat foods that have plenty of fiber.  Keep all follow-up visits as told by your doctor. This is important. GET HELP IF:  Your pain gets worse.  Pooping causes you pain.  You cannot poop (constipation).  You are leaking pee (urinary incontinence).  You have a fever.   This information is not intended to replace advice given to you by your health care provider. Make sure you discuss any questions you have with your health care provider.   Document Released: 07/02/2010 Document Revised: 10/14/2014 Document Reviewed: 05/26/2014 Elsevier Interactive Patient Education [employer].

## 2016-06-14 ENCOUNTER — Telehealth: Payer: Self-pay | Admitting: Family Medicine

## 2017-11-02 ENCOUNTER — Ambulatory Visit: Payer: Self-pay | Admitting: Physician Assistant

## 2017-11-02 ENCOUNTER — Ambulatory Visit (INDEPENDENT_AMBULATORY_CARE_PROVIDER_SITE_OTHER): Payer: Self-pay | Admitting: Physician Assistant

## 2017-11-02 ENCOUNTER — Encounter: Payer: Self-pay | Admitting: Physician Assistant

## 2017-11-02 ENCOUNTER — Ambulatory Visit (INDEPENDENT_AMBULATORY_CARE_PROVIDER_SITE_OTHER): Payer: Self-pay

## 2017-11-02 VITALS — BP 115/73 | HR 57 | Temp 97.3°F | Ht 71.0 in | Wt 149.2 lb

## 2017-11-02 DIAGNOSIS — M25521 Pain in right elbow: Secondary | ICD-10-CM

## 2017-11-02 DIAGNOSIS — M7918 Myalgia, other site: Secondary | ICD-10-CM

## 2017-11-02 MED ORDER — NAPROXEN 500 MG PO TABS: 500.0000 mg | ORAL_TABLET | Freq: Two times a day (BID) | ORAL | 1 refills | Status: DC

## 2017-11-02 MED ORDER — CYCLOBENZAPRINE HCL 10 MG PO TABS: 10.0000 mg | ORAL_TABLET | Freq: Three times a day (TID) | ORAL | 1 refills | Status: DC | PRN

## 2017-11-02 NOTE — Patient Instructions (Signed)
Tennis Elbow Tennis elbow is puffiness (inflammation) of the outer tendons of your forearm close to your elbow. Your tendons attach your muscles to your bones. Tennis elbow can happen in any sport or job in which you use your elbow too much. It is caused by doing the same motion over and over. Tennis elbow can cause:  Pain and tenderness in your forearm and the outer part of your elbow.  A burning feeling. This runs from your elbow through your arm.  Weak grip in your hands.  Follow these instructions at home: Activity  Rest your elbow and wrist as told by your doctor. Try to avoid any activities that caused the problem until your doctor says that you can do them again.  If a physical therapist teaches you exercises, do all of them as told.  If you lift an object, lift it with your palm facing up. This is easier on your elbow. Lifestyle  If your tennis elbow is caused by sports, check your equipment and make sure that: ? You are using it correctly. ? It fits you well.  If your tennis elbow is caused by work, take breaks often, if you are able. Talk with your manager about doing your work in a way that is safe for you. ? If your tennis elbow is caused by computer use, talk with your manager about any changes that can be made to your work setup. General instructions  If told, apply ice to the painful area: ? Put ice in a plastic bag. ? Place a towel between your skin and the bag. ? Leave the ice on for 20 minutes, 2-3 times per day.  Take medicines only as told by your doctor.  If you were given a brace, wear it as told by your doctor.  Keep all follow-up visits as told by your doctor. This is important. Contact a doctor if:  Your pain does not get better with treatment.  Your pain gets worse.  You have weakness in your forearm, hand, or fingers.  You cannot feel your forearm, hand, or fingers. This information is not intended to replace advice given to you by your health  care provider. Make sure you discuss any questions you have with your health care provider. Document Released: 11/17/2009 Document Revised: 01/28/2016 Document Reviewed: 05/26/2014 Elsevier Interactive Patient Education  2018 Elsevier Inc.  

## 2017-11-07 NOTE — Progress Notes (Signed)
BP 115/73   Pulse (!) 57   Temp (!) 97.3 F (36.3 C) (Oral)   Ht 5\' 11"  (1.803 m)   Wt 149 lb 3.2 oz (67.7 kg)   BMI 20.81 kg/m    Subjective:    Patient ID: Jacob Reynolds, male    DOB: 10-09-1965, 52 y.o.   MRN: [id number]  HPI: Jacob Reynolds is a 52 y.o. male presenting on 11/02/2017 for Elbow Pain (right )  Patient has had significant right elbow pain and swelling over the past 24 hours.  He has had issues of it bothering him before.  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  He denies any specific traumatic injury to his elbow.  Past Medical History:  Diagnosis Date  . Plantar fasciitis    Relevant past medical, surgical, family and social history reviewed and updated as indicated. Interim medical history since our last visit reviewed. Allergies and medications reviewed and updated. DATA REVIEWED: CHART IN EPIC  Family History reviewed for pertinent findings.  Review of Systems  Constitutional: Negative.  Negative for appetite change and fatigue.  Eyes: Negative for pain and visual disturbance.  Respiratory: Negative.  Negative for cough, chest tightness, shortness of breath and wheezing.   Cardiovascular: Negative.  Negative for chest pain, palpitations and leg swelling.  Gastrointestinal: Negative.  Negative for abdominal pain, diarrhea, nausea and vomiting.  Genitourinary: Negative.   Musculoskeletal: Positive for arthralgias, back pain, joint swelling and myalgias.  Skin: Negative.  Negative for color change and rash.  Neurological: Negative.  Negative for weakness, numbness and headaches.  Psychiatric/Behavioral: Negative.     Allergies as of 11/02/2017      Reactions   Iodine    Throat felt like closed      Medication List        Accurate as of 11/02/17 11:59 PM. Always use your most recent med list.          cyclobenzaprine 10 MG tablet Commonly known as:  FLEXERIL Take 1 tablet (10 mg total) by mouth 3 (three) times  daily as needed for muscle spasms.   naproxen 500 MG tablet Commonly known as:  NAPROSYN Take 1 tablet (500 mg total) by mouth 2 (two) times daily with a meal.          Objective:    BP 115/73   Pulse (!) 57   Temp (!) 97.3 F (36.3 C) (Oral)   Ht 5\' 11"  (1.803 m)   Wt 149 lb 3.2 oz (67.7 kg)   BMI 20.81 kg/m   Allergies  Allergen Reactions  . Iodine     Throat felt like closed    Wt Readings from Last 3 Encounters:  11/02/17 149 lb 3.2 oz (67.7 kg)  04/08/16 142 lb (64.4 kg)  12/24/15 145 lb 6.4 oz (66 kg)    Physical Exam  Constitutional: He appears well-developed and well-nourished. No distress.  HENT:  Head: Normocephalic and atraumatic.  Eyes: Pupils are equal, round, and reactive to light. Conjunctivae and EOM are normal.  Cardiovascular: Normal rate, regular rhythm and normal heart sounds.  Pulmonary/Chest: Effort normal and breath sounds normal. No respiratory distress.  Musculoskeletal:       Right elbow: He exhibits swelling and deformity. Tenderness found. Lateral epicondyle tenderness noted.       Arms: Skin: Skin is warm and dry.  Psychiatric: He has a normal mood and affect. His behavior is normal.  Nursing note and vitals reviewed.       Assessment & Plan:   1. Right elbow pain - DG Elbow 2 Views Right; Future  2. Buttock pain - cyclobenzaprine (FLEXERIL) 10 MG tablet; Take 1 tablet (10 mg total) by mouth 3 (three) times daily as needed for muscle spasms.  Dispense: 40 tablet; Refill: 1 - naproxen (NAPROSYN) 500 MG tablet; Take 1 tablet (500 mg total) by mouth 2 (two) times daily with a meal.  Dispense: 60 tablet; Refill: 1   Continue all other maintenance medications as listed above.  Follow up plan: No follow-ups on file.  Educational handout given for [place] PA-C [facility] [address]  [place], [place] [address] [phone]   11/07/2017, 1:30 PM

## 2018-11-19 ENCOUNTER — Encounter (INDEPENDENT_AMBULATORY_CARE_PROVIDER_SITE_OTHER): Payer: Self-pay

## 2021-02-01 ENCOUNTER — Ambulatory Visit (INDEPENDENT_AMBULATORY_CARE_PROVIDER_SITE_OTHER): Payer: Commercial Managed Care - PPO

## 2021-02-01 ENCOUNTER — Encounter: Payer: Self-pay | Admitting: Family Medicine

## 2021-02-01 ENCOUNTER — Ambulatory Visit: Payer: Commercial Managed Care - PPO | Admitting: Family Medicine

## 2021-02-01 ENCOUNTER — Other Ambulatory Visit: Payer: Self-pay

## 2021-02-01 VITALS — BP 115/73 | HR 60 | Temp 98.1°F | Ht 71.0 in | Wt 164.2 lb

## 2021-02-01 DIAGNOSIS — L989 Disorder of the skin and subcutaneous tissue, unspecified: Secondary | ICD-10-CM

## 2021-02-01 DIAGNOSIS — Z7689 Persons encountering health services in other specified circumstances: Secondary | ICD-10-CM

## 2021-02-01 DIAGNOSIS — M5412 Radiculopathy, cervical region: Secondary | ICD-10-CM | POA: Diagnosis not present

## 2021-02-01 DIAGNOSIS — M542 Cervicalgia: Secondary | ICD-10-CM | POA: Diagnosis not present

## 2021-02-01 DIAGNOSIS — G8929 Other chronic pain: Secondary | ICD-10-CM

## 2021-02-01 DIAGNOSIS — M7732 Calcaneal spur, left foot: Secondary | ICD-10-CM

## 2021-02-01 DIAGNOSIS — F322 Major depressive disorder, single episode, severe without psychotic features: Secondary | ICD-10-CM | POA: Insufficient documentation

## 2021-02-01 DIAGNOSIS — F411 Generalized anxiety disorder: Secondary | ICD-10-CM | POA: Diagnosis not present

## 2021-02-01 DIAGNOSIS — L84 Corns and callosities: Secondary | ICD-10-CM

## 2021-02-01 MED ORDER — NAPROXEN 500 MG PO TABS: 500.0000 mg | ORAL_TABLET | Freq: Two times a day (BID) | ORAL | 1 refills | Status: DC

## 2021-02-01 MED ORDER — CYCLOBENZAPRINE HCL 10 MG PO TABS: 10.0000 mg | ORAL_TABLET | Freq: Three times a day (TID) | ORAL | 1 refills | Status: DC | PRN

## 2021-02-01 MED ORDER — PREDNISONE 20 MG PO TABS: 40.0000 mg | ORAL_TABLET | Freq: Every day | ORAL | 0 refills | Status: AC

## 2021-02-01 NOTE — Patient Instructions (Signed)
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
## 2021-02-03 ENCOUNTER — Telehealth: Payer: Self-pay | Admitting: Family Medicine

## 2021-02-09 ENCOUNTER — Ambulatory Visit: Payer: Commercial Managed Care - PPO | Attending: Family Medicine | Admitting: Physical Therapy

## 2021-02-09 ENCOUNTER — Encounter: Payer: Self-pay | Admitting: Physical Therapy

## 2021-02-09 ENCOUNTER — Other Ambulatory Visit: Payer: Self-pay

## 2021-02-09 DIAGNOSIS — M542 Cervicalgia: Secondary | ICD-10-CM | POA: Diagnosis not present

## 2021-02-09 DIAGNOSIS — R293 Abnormal posture: Secondary | ICD-10-CM | POA: Diagnosis present
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
## 2021-07-23 ENCOUNTER — Ambulatory Visit: Payer: Commercial Managed Care - PPO | Admitting: Nurse Practitioner

## 2021-07-27 ENCOUNTER — Ambulatory Visit: Payer: Commercial Managed Care - PPO | Admitting: Nurse Practitioner

## 2021-07-27 ENCOUNTER — Encounter: Payer: Self-pay | Admitting: Nurse Practitioner

## 2021-07-27 VITALS — BP 121/79 | HR 60 | Temp 98.3°F | Ht 71.0 in | Wt 166.0 lb

## 2021-07-27 DIAGNOSIS — M545 Low back pain, unspecified: Secondary | ICD-10-CM | POA: Diagnosis not present

## 2021-07-27 DIAGNOSIS — M79671 Pain in right foot: Secondary | ICD-10-CM | POA: Diagnosis not present

## 2021-07-27 DIAGNOSIS — M5412 Radiculopathy, cervical region: Secondary | ICD-10-CM

## 2021-07-27 DIAGNOSIS — G8929 Other chronic pain: Secondary | ICD-10-CM | POA: Diagnosis not present

## 2021-07-27 NOTE — Assessment & Plan Note (Signed)
Referral to podiatry completed.

## 2021-07-27 NOTE — Patient Instructions (Signed)
Chronic Back Pain When back pain lasts longer than 3 months, it is called chronic back pain. Pain may get worse at certain times (flare-ups). There are things you can do at home to manage your pain. Follow these instructions at home: Pay attention to any changes in your symptoms. Take these actions to help with your pain: Managing pain and stiffness   If told, put ice on the painful area. Your doctor may tell you to use ice for 24-48 hours after the flare-up starts. To do this: Put ice in a plastic bag. Place a towel between your skin and the bag. Leave the ice on for 20 minutes, 2-3 times a day. If told, put heat on the painful area. Do this as often as told by your doctor. Use the heat source that your doctor recommends, such as a moist heat pack or a heating pad. Place a towel between your skin and the heat source. Leave the heat on for 20-30 minutes. Take off the heat if your skin turns bright red. This is especially important if you are unable to feel pain, heat, or cold. You may have a greater risk of getting burned. Soak in a warm bath. This can help relieve pain. Activity  Avoid bending and other activities that make pain worse. When standing: Keep your upper back and neck straight. Keep your shoulders pulled back. Avoid slouching. When sitting: Keep your back straight. Relax your shoulders. Do not round your shoulders or pull them backward. Do not sit or stand in one place for long periods of time. Take short rest breaks during the day. Lying down or standing is usually better than sitting. Resting can help relieve pain. When sitting or lying down for a long time, do some mild activity or stretching. This will help to prevent stiffness and pain. Get regular exercise. Ask your doctor what activities are safe for you. Do not lift anything that is heavier than 10 lb (4.5 kg) or the limit that you are told, until your doctor says that it is safe. To prevent injury when you lift  things: Bend your knees. Keep the weight close to your body. Avoid twisting. Sleep on a firm mattress. Try lying on your side with your knees slightly bent. If you lie on your back, put a pillow under your knees. Medicines Treatment may include medicines for pain and swelling taken by mouth or put on the skin, prescription pain medicine, or muscle relaxants. Take over-the-counter and prescription medicines only as told by your doctor. Ask your doctor if the medicine prescribed to you: Requires you to avoid driving or using machinery. Can cause trouble pooping (constipation). You may need to take these actions to prevent or treat trouble pooping: Drink enough fluid to keep your pee (urine) pale yellow. Take over-the-counter or prescription medicines. Eat foods that are high in fiber. These include beans, whole grains, and fresh fruits and vegetables. Limit foods that are high in fat and sugars. These include fried or sweet foods. General instructions Do not use any products that contain nicotine or tobacco, such as cigarettes, e-cigarettes, and chewing tobacco. If you need help quitting, ask your doctor. Keep all follow-up visits as told by your doctor. This is important. Contact a doctor if: Your pain does not get better with rest or medicine. Your pain gets worse, or you have new pain. You have a high fever. You lose weight very quickly. You have trouble doing your normal activities. Get help right away if: One   or both of your legs or feet feel weak. One or both of your legs or feet lose feeling (have numbness). You have trouble controlling when you poop (have a bowel movement) or pee (urinate). You have bad back pain and: You feel like you may vomit (nauseous), or you vomit. You have pain in your belly (abdomen). You have shortness of breath. You faint. Summary When back pain lasts longer than 3 months, it is called chronic back pain. Pain may get worse at certain times  (flare-ups). Use ice and heat as told by your doctor. Your doctor may tell you to use ice after flare-ups. This information is not intended to replace advice given to you by your health care provider. Make sure you discuss any questions you have with your health care provider. Document Revised: 07/10/2019 Document Reviewed: 07/10/2019 Elsevier Patient Education  2022 [employer] Inc[employer]  

## 2021-07-27 NOTE — Assessment & Plan Note (Signed)
Continue Flexeril 10 mg tablet by mouth 3 times daily as needed for muscle spasm.  And naproxen 500 mg tablet by mouth twice daily.  Patient knows to follow-up with worsening hours of symptoms.

## 2021-07-27 NOTE — Progress Notes (Signed)
Acute Office Visit  Subjective:    Patient ID: Jacob Reynolds, male    DOB: Jul 21, 1965, 56 y.o.   MRN: [id number]  Chief Complaint  Patient presents with   back pain, foot pain    HPI Patient is in today for Back Pain  He reports chronic back pain. There was not an injury that may have caused the pain. The most recent episode started a few years ago and is staying constant. The pain is located in the across the lower backwithout radiation. It is described as aching, is moderate in intensity, occurring intermittently. Symptoms are worse in the: morning, mid-day, afternoon  Aggravating factors: bending backwards and bending forwards Relieving factors: none.  He has tried NSAIDs with significant relief.   Associated symptoms: No abdominal pain No bowel incontinence  No chest pain No dysuria   No fever No headaches  No joint pains No pelvic pain  No weakness in leg  No tingling in lower extremities  No urinary incontinence No weight loss   Pain  He reports chronic right lateral foot pain. was not an injury that may have caused the pain. The pain started  few years ago and is staying constant. The pain does not radiate . The pain is described as soreness, is moderate in intensity, occurring intermittently. Symptoms are the same all day.  Aggravating factors: walking Relieving factors: none.  He has tried NSAIDs with moderate relief.   Patient has had podiatry remove planter growth in the past from left lateral foot that grows right back  Past Medical History:  Diagnosis Date   Plantar fasciitis     History reviewed. No pertinent surgical history.  Family History  Problem Relation Age of Onset   Hypertension Mother    Parkinson's disease Father     Social History   Socioeconomic History   Marital status: Single    Spouse name: Not on file   Number of children: 1   Years of education: 10   Highest education level: 11th grade  Occupational History   Not on file   Tobacco Use   Smoking status: Every Day    Packs/day: 1.00    Years: 36.00    Pack years: 36.00    Types: Cigarettes    Start date: 06/13/1978   Smokeless tobacco: Never  Substance and Sexual Activity   Alcohol use: Yes    Alcohol/week: 7.0 standard drinks    Types: 7 Cans of beer per week   Drug use: Yes    Frequency: 1.0 times per week    Types: Marijuana   Sexual activity: Not on file  Other Topics Concern   Not on file  Social History Narrative   Not on file   Social Determinants of Health   Financial Resource Strain: Not on file  Food Insecurity: Not on file  Transportation Needs: Not on file  Physical Activity: Not on file  Stress: Not on file  Social Connections: Not on file  Intimate Partner Violence: Not on file    Outpatient Medications Prior to Visit  Medication Sig Dispense Refill   cyclobenzaprine (FLEXERIL) 10 MG tablet Take 1 tablet (10 mg total) by mouth 3 (three) times daily as needed for muscle spasms. 90 tablet 1   naproxen (NAPROSYN) 500 MG tablet Take 1 tablet (500 mg total) by mouth 2 (two) times daily with a meal. 60 tablet 1   No facility-administered medications prior to visit.    Allergies  Allergen Reactions  Iodine     Throat felt like closed    Review of Systems  Constitutional: Negative.   HENT: Negative.    Respiratory: Negative.    Genitourinary: Negative.   Musculoskeletal:  Positive for back pain.  Skin: Negative.  Negative for rash.  Neurological: Negative.   All other systems reviewed and are negative.     Objective:    Physical Exam Vitals and nursing note reviewed.  Constitutional:      Appearance: Normal appearance. He is obese.  HENT:     Head: Normocephalic.     Right Ear: External ear normal.     Left Ear: External ear normal.     Nose: Nose normal.  Eyes:     Conjunctiva/sclera: Conjunctivae normal.  Cardiovascular:     Pulses: Normal pulses.     Heart sounds: Normal heart sounds.  Pulmonary:      Effort: Pulmonary effort is normal.     Breath sounds: Normal breath sounds.  Abdominal:     General: Bowel sounds are normal.  Musculoskeletal:        General: Normal range of motion.     Lumbar back: Tenderness present.       Back:     Right foot: Tenderness present.       Legs:     Comments: Chronic back pain  Right lateral foot planter growth and discomfort  Skin:    General: Skin is warm.     Findings: No rash.  Neurological:     Mental Status: He is alert and oriented to person, place, and time.  Psychiatric:        Mood and Affect: Mood normal.        Behavior: Behavior normal.    BP 121/79    Pulse 60    Temp 98.3 F (36.8 C)    Ht 5\' 11"  (1.803 m)    Wt 166 lb (75.3 kg)    SpO2 99%    BMI 23.15 kg/m  Wt Readings from Last 3 Encounters:  07/27/21 166 lb (75.3 kg)  02/01/21 164 lb 4 oz (74.5 kg)  11/02/17 149 lb 3.2 oz (67.7 kg)    There are no preventive care reminders to display for this patient.  There are no preventive care reminders to display for this patient.   Lab Results  Component Value Date   TSH 1.190 04/29/2013   Lab Results  Component Value Date   WBC 9.0 04/29/2013   HGB 14.7 04/29/2013   HCT 43.7 04/29/2013   MCV 90.7 04/29/2013   Lab Results  Component Value Date   NA 140 09/05/2015   K 4.5 09/05/2015   CO2 22 09/05/2015   GLUCOSE 105 (H) 09/05/2015   BUN 18 09/05/2015   CREATININE 0.84 09/05/2015   BILITOT 0.5 09/05/2015   ALKPHOS 93 09/05/2015   AST 21 09/05/2015   ALT 26 09/05/2015   PROT 7.4 09/05/2015   ALBUMIN 4.3 09/05/2015   CALCIUM 9.3 09/05/2015   Lab Results  Component Value Date   CHOL 207 (H) 09/05/2015   Lab Results  Component Value Date   HDL 56 09/05/2015   Lab Results  Component Value Date   LDLCALC 136 (H) 09/05/2015   Lab Results  Component Value Date   TRIG 76 09/05/2015   Lab Results  Component Value Date   CHOLHDL 3.7 09/05/2015   Lab Results  Component Value Date   HGBA1C 5.4  09/08/2015       Assessment &  Plan:   Problem List Items Addressed This Visit       Nervous and Auditory   Cervical radiculopathy    Continue Flexeril 10 mg tablet by mouth 3 times daily as needed for muscle spasm.  And naproxen 500 mg tablet by mouth twice daily.  Patient knows to follow-up with worsening hours of symptoms.        Other   Foot pain, right    Referral to podiatry completed.      Relevant Orders   Ambulatory referral to Podiatry   Other Visit Diagnoses     Chronic midline low back pain without sciatica    -  Primary        No orders of the defined types were placed in this encounter.    Ivy Lynn, NP

## 2021-08-05 ENCOUNTER — Encounter: Payer: Self-pay | Admitting: Family Medicine

## 2021-08-05 ENCOUNTER — Other Ambulatory Visit: Payer: Self-pay

## 2021-08-05 ENCOUNTER — Ambulatory Visit: Payer: Commercial Managed Care - PPO

## 2021-08-05 ENCOUNTER — Ambulatory Visit (INDEPENDENT_AMBULATORY_CARE_PROVIDER_SITE_OTHER): Payer: Commercial Managed Care - PPO

## 2021-08-05 ENCOUNTER — Ambulatory Visit (INDEPENDENT_AMBULATORY_CARE_PROVIDER_SITE_OTHER): Payer: Commercial Managed Care - PPO | Admitting: Podiatry

## 2021-08-05 ENCOUNTER — Ambulatory Visit (INDEPENDENT_AMBULATORY_CARE_PROVIDER_SITE_OTHER): Payer: Commercial Managed Care - PPO | Admitting: Family Medicine

## 2021-08-05 ENCOUNTER — Telehealth: Payer: Self-pay

## 2021-08-05 VITALS — BP 114/73 | HR 62 | Temp 98.1°F | Ht 71.0 in | Wt 163.0 lb

## 2021-08-05 DIAGNOSIS — Z122 Encounter for screening for malignant neoplasm of respiratory organs: Secondary | ICD-10-CM | POA: Diagnosis not present

## 2021-08-05 DIAGNOSIS — M79671 Pain in right foot: Secondary | ICD-10-CM

## 2021-08-05 DIAGNOSIS — L84 Corns and callosities: Secondary | ICD-10-CM

## 2021-08-05 DIAGNOSIS — Z0000 Encounter for general adult medical examination without abnormal findings: Secondary | ICD-10-CM

## 2021-08-05 DIAGNOSIS — Z1159 Encounter for screening for other viral diseases: Secondary | ICD-10-CM | POA: Diagnosis not present

## 2021-08-05 DIAGNOSIS — Z0001 Encounter for general adult medical examination with abnormal findings: Secondary | ICD-10-CM

## 2021-08-05 DIAGNOSIS — B351 Tinea unguium: Secondary | ICD-10-CM

## 2021-08-05 DIAGNOSIS — M722 Plantar fascial fibromatosis: Secondary | ICD-10-CM | POA: Diagnosis not present

## 2021-08-05 DIAGNOSIS — Z1212 Encounter for screening for malignant neoplasm of rectum: Secondary | ICD-10-CM

## 2021-08-05 DIAGNOSIS — M79672 Pain in left foot: Secondary | ICD-10-CM

## 2021-08-05 DIAGNOSIS — Z1211 Encounter for screening for malignant neoplasm of colon: Secondary | ICD-10-CM

## 2021-08-05 DIAGNOSIS — F320 Major depressive disorder, single episode, mild: Secondary | ICD-10-CM

## 2021-08-05 DIAGNOSIS — L989 Disorder of the skin and subcutaneous tissue, unspecified: Secondary | ICD-10-CM

## 2021-08-05 DIAGNOSIS — Z114 Encounter for screening for human immunodeficiency virus [HIV]: Secondary | ICD-10-CM

## 2021-08-05 DIAGNOSIS — M7732 Calcaneal spur, left foot: Secondary | ICD-10-CM

## 2021-08-05 DIAGNOSIS — M216X1 Other acquired deformities of right foot: Secondary | ICD-10-CM | POA: Diagnosis not present

## 2021-08-05 DIAGNOSIS — Z125 Encounter for screening for malignant neoplasm of prostate: Secondary | ICD-10-CM

## 2021-08-05 LAB — LIPID PANEL

## 2021-08-05 MED ORDER — MELOXICAM 15 MG PO TABS: 15.0000 mg | ORAL_TABLET | Freq: Every day | ORAL | 0 refills | Status: DC | PRN

## 2021-08-05 MED ORDER — CICLOPIROX 8 % EX SOLN: Freq: Every day | CUTANEOUS | 0 refills | Status: DC

## 2021-08-05 NOTE — Progress Notes (Signed)
SITUATION Reason for Consult: Evaluation for Bilateral Custom Foot Orthoses Patient / Caregiver Report: Patient is ready for foot orthotics  OBJECTIVE DATA: Patient History / Diagnosis:    ICD-10-CM   1. Bilateral foot pain  M79.671    M79.672       Current or Previous Devices: None and no history  Foot Examination: Skin presentation:   Intact Ulcers & Callousing:   None and no history Toe / Foot Deformities:  Rectus Weight Bearing Presentation:  Rectus Sensation:    Intact  ORTHOTIC RECOMMENDATION Recommended Device: 1x pair of custom functional foot orthotics  GOALS OF ORTHOSES - Reduce Pain - Prevent Foot Deformity - Prevent Progression of Further Foot Deformity - Relieve Pressure - Improve the Overall Biomechanical Function of the Foot and Lower Extremity.  ACTIONS PERFORMED Patient was casted for Foot Orthoses via crush box. Procedure was explained and patient tolerated procedure well. All questions were answered and concerns addressed.  PLAN Potential out of pocket cost was communicated to patient. Casts are to be sent to [facility] for fabrication. Patient is to be called for fitting when devices are ready.

## 2021-08-05 NOTE — Addendum Note (Signed)
Addended by: Baruch Gouty on: 08/05/2021 09:06 AM   Modules accepted: Level of Service

## 2021-08-05 NOTE — Patient Instructions (Signed)

## 2021-08-05 NOTE — Telephone Encounter (Signed)
Casts sent to central fab

## 2021-08-05 NOTE — Progress Notes (Addendum)
Subjective:  Patient ID: Jacob Reynolds, male    DOB: 1965/07/12, 56 y.o.   MRN: [id number]  Patient Care Team: [name], FNP as PCP - General (Family Medicine)   Chief Complaint:  Annual Exam   HPI: Jacob Reynolds is a 56 y.o. male presenting on 08/05/2021 for Annual Exam   Patient presents for an annual physical exam with no complaints. He is current smoker of 1 PPD for over 30 years. He has some skin concerns he would like addressed. No other specific complaints or concerns.   There are no diagnoses linked to this encounter.  Depression screen [facility] 2/9 08/05/2021 07/27/2021 02/01/2021 04/08/2016 12/24/2015  Decreased Interest 2 2 2  0 1  Down, Depressed, Hopeless 3 3 3  0 2  PHQ - 2 Score 5 5 5  0 3  Altered sleeping 1 1 2  - 1  Tired, decreased energy 1 1 3  - 1  Change in appetite 2 1 3  - 0  Feeling bad or failure about yourself  3 3 3  - 2  Trouble concentrating 2 1 1  - 0  Moving slowly or fidgety/restless 0 0 0 - 0  Suicidal thoughts 0 0 0 - 0  PHQ-9 Score 14 12 17  - 7  Difficult doing work/chores Somewhat difficult Somewhat difficult Somewhat difficult - Not difficult at all     Relevant past medical, surgical, family, and social history reviewed and updated as indicated.  Allergies and medications reviewed and updated. Data reviewed: Chart in Epic.   Past Medical History:  Diagnosis Date   Plantar fasciitis     History reviewed. No pertinent surgical history.  Social History   Socioeconomic History   Marital status: Single    Spouse name: Not on file   Number of children: 1   Years of education: 16   Highest education level: 11th grade  Occupational History   Not on file  Tobacco Use   Smoking status: Every Day    Packs/day: 1.00    Years: 36.00    Pack years: 36.00    Types: Cigarettes    Start date: 06/13/1978   Smokeless tobacco: Never  Substance and Sexual Activity   Alcohol use: Yes    Alcohol/week: 7.0 standard drinks    Types: 7 Cans of  beer per week   Drug use: Yes    Frequency: 1.0 times per week    Types: Marijuana   Sexual activity: Not on file  Other Topics Concern   Not on file  Social History Narrative   Not on file   Social Determinants of Health   Financial Resource Strain: Not on file  Food Insecurity: Not on file  Transportation Needs: Not on file  Physical Activity: Not on file  Stress: Not on file  Social Connections: Not on file  Intimate Partner Violence: Not on file    Outpatient Encounter Medications as of 08/05/2021  Medication Sig   cyclobenzaprine (FLEXERIL) 10 MG tablet Take 1 tablet (10 mg total) by mouth 3 (three) times daily as needed for muscle spasms.   [DISCONTINUED] naproxen (NAPROSYN) 500 MG tablet Take 1 tablet (500 mg total) by mouth 2 (two) times daily with a meal.   No facility-administered encounter medications on file as of 08/05/2021.    Allergies  Allergen Reactions   Iodine     Throat felt like closed    Review of Systems  Constitutional:  Negative for activity change, appetite change, chills, diaphoresis, fatigue, fever  and unexpected weight change.  Eyes:  Negative for photophobia and visual disturbance.  Respiratory:  Negative for shortness of breath.   Cardiovascular:  Negative for chest pain and palpitations.  Gastrointestinal:  Negative for abdominal pain, anal bleeding, blood in stool, constipation, diarrhea, nausea, rectal pain and vomiting.  Genitourinary:  Negative for decreased urine volume and difficulty urinating.  Musculoskeletal:  Positive for back pain. Negative for arthralgias, gait problem, joint swelling, myalgias, neck pain and neck stiffness.  Skin:  Positive for color change.  Neurological:  Negative for dizziness, tremors, seizures, syncope, facial asymmetry, speech difficulty, weakness, light-headedness, numbness and headaches.  Psychiatric/Behavioral:  Positive for dysphoric mood. Negative for self-injury, sleep disturbance and suicidal ideas.    All other systems reviewed and are negative.      Objective:  BP 114/73    Pulse 62    Temp 98.1 F (36.7 C) (Temporal)    Ht 5' 11"  (1.803 m)    Wt 73.9 kg    SpO2 95%    BMI 22.73 kg/m    Wt Readings from Last 3 Encounters:  08/05/21 73.9 kg  07/27/21 75.3 kg  02/01/21 74.5 kg    Physical Exam Vitals and nursing note reviewed.  Constitutional:      Appearance: He is normal weight.  HENT:     Head: Normocephalic and atraumatic.     Right Ear: Tympanic membrane, ear canal and external ear normal.     Left Ear: Tympanic membrane, ear canal and external ear normal.     Nose: Nose normal.     Mouth/Throat:     Mouth: Mucous membranes are moist.  Eyes:     Extraocular Movements: Extraocular movements intact.     Conjunctiva/sclera: Conjunctivae normal.     Pupils: Pupils are equal, round, and reactive to light.  Cardiovascular:     Rate and Rhythm: Normal rate and regular rhythm.     Pulses: Normal pulses.     Heart sounds: Normal heart sounds.  Pulmonary:     Effort: Pulmonary effort is normal.     Breath sounds: Normal breath sounds.  Abdominal:     General: Bowel sounds are normal.     Palpations: Abdomen is soft.     Tenderness: There is no abdominal tenderness.  Musculoskeletal:        General: Normal range of motion.     Cervical back: Normal range of motion.  Skin:    General: Skin is warm and dry.     Capillary Refill: Capillary refill takes less than 2 seconds.       Neurological:     Mental Status: He is alert.  Psychiatric:        Mood and Affect: Mood normal.        Behavior: Behavior normal.        Thought Content: Thought content normal.        Judgment: Judgment normal.    Results for orders placed or performed in visit on 10/13/15  GC/Chlamydia Probe Amp   Specimen: Urine   URINE  Result Value Ref Range   Chlamydia trachomatis, NAA Negative Negative   Neisseria gonorrhoeae by PCR Negative Negative  Microscopic Examination   URINE   Result Value Ref Range   WBC, UA 6-10 (A) 0 - 5 /hpf   RBC, UA >30 (A) 0 - 2 /hpf   Epithelial Cells (non renal) 0-10 0 - 10 /hpf   Bacteria, UA None seen None seen/Few  Urinalysis, Complete  Result Value Ref Range   Specific Gravity, UA 1.020 1.005 - 1.030   pH, UA 6.0 5.0 - 7.5   Color, UA Yellow Yellow   Appearance Ur Clear Clear   Leukocytes, UA 1+ (A) Negative   Protein, UA 1+ (A) Negative/Trace   Glucose, UA Negative Negative   Ketones, UA Negative Negative   RBC, UA 3+ (A) Negative   Bilirubin, UA Negative Negative   Urobilinogen, Ur 0.2 0.2 - 1.0 mg/dL   Nitrite, UA Negative Negative   Microscopic Examination See below:        Pertinent labs & imaging results that were available during my care of the patient were reviewed by me and considered in my medical decision making.  Assessment & Plan:  Jacob Reynolds was seen today for annual exam.  Diagnoses and all orders for this visit:  Annual physical exam Health maintenance discussed in detail and studies ordered. Labs pending. Smoking cessation discussed.  -     CBC with Differential -     CMP14+EGFR -     Lipid Panel -     Thyroid Panel With TSH  Screening for colorectal cancer -     Cologuard. Patient hesitant of colonoscopy, agreeable of Cologuard. Order placed.   Encounter for screening for malignant neoplasm of lung -     CT CHEST LUNG CA SCREEN LOW DOSE W/O CM; Future.  - Current smoker, 1 PPD, patient agreeable to LDCT for screening.   Encounter for hepatitis C screening test for low risk patient -     Hepatitis C Antibody  Encounter for screening for HIV -     HIV antibody (with reflex)  Screening for prostate cancer -     PSA, total and free  Skin lesion -     Ambulatory referral to Dermatology - Skin lesion of the the left shin, suspect for squamous cell carcinoma. Will refer to dermatology for evaluation.   Depression, major, single episode, mild ([place]) -PHQ 14, patient attributes to financial  strain. Denies SI or HI. Denies assistance with SSRI therapy. Counseling provided. Patient educated to return if symptoms worsen.    Callus Calcaneal spur of the left foot  - Patient has an appointment with podiatry this afternoon.   Continue all other maintenance medications.  Follow up plan: Return in about 1 year (around 08/05/2022) for with PCP for CPE.   Continue healthy lifestyle choices, including diet (rich in fruits, vegetables, and lean proteins, and low in salt and simple carbohydrates) and exercise (at least 30 minutes of moderate physical activity daily).   The above assessment and management plan was discussed with the patient. The patient verbalized understanding of and has agreed to the management plan. Patient is aware to call the clinic if they develop any new symptoms or if symptoms persist or worsen. Patient is aware when to return to the clinic for a follow-up visit. Patient educated on when it is appropriate to go to the emergency department.   Addison Eliah Marquard, NP-S  I personally was present during the history, physical exam, and medical decision-making activities of this visit and have verified that the services and findings are accurately documented in the nurse practitioner student's note.  Monia Pouch, FNP-C [facility] [address] [place], [place] [address] [phone]

## 2021-08-06 LAB — THYROID PANEL WITH TSH
Free Thyroxine Index: 2.2 (ref 1.2–4.9)
T3 Uptake Ratio: 27 % (ref 24–39)
T4, Total: 8 ug/dL (ref 4.5–12.0)
TSH: 1.29 u[IU]/mL (ref 0.450–4.500)

## 2021-08-06 LAB — CMP14+EGFR
ALT: 21 IU/L (ref 0–44)
AST: 21 IU/L (ref 0–40)
Albumin/Globulin Ratio: 1.5 (ref 1.2–2.2)
Albumin: 4.6 g/dL (ref 3.8–4.9)
Alkaline Phosphatase: 101 IU/L (ref 44–121)
BUN/Creatinine Ratio: 26 — ABNORMAL HIGH (ref 9–20)
BUN: 23 mg/dL (ref 6–24)
Bilirubin Total: 0.4 mg/dL (ref 0.0–1.2)
CO2: 21 mmol/L (ref 20–29)
Calcium: 9.5 mg/dL (ref 8.7–10.2)
Chloride: 104 mmol/L (ref 96–106)
Creatinine, Ser: 0.88 mg/dL (ref 0.76–1.27)
Globulin, Total: 3 g/dL (ref 1.5–4.5)
Glucose: 95 mg/dL (ref 70–99)
Potassium: 4.5 mmol/L (ref 3.5–5.2)
Sodium: 141 mmol/L (ref 134–144)
Total Protein: 7.6 g/dL (ref 6.0–8.5)
eGFR: 102 mL/min/{1.73_m2} (ref >59)

## 2021-08-06 LAB — CBC WITH DIFFERENTIAL/PLATELET
Basophils Absolute: 0.1 10*3/uL (ref 0.0–0.2)
Basos: 1 %
EOS (ABSOLUTE): 0.4 10*3/uL (ref 0.0–0.4)
Eos: 4 %
Hematocrit: 47.5 % (ref 37.5–51.0)
Hemoglobin: 16 g/dL (ref 13.0–17.7)
Immature Grans (Abs): 0 10*3/uL (ref 0.0–0.1)
Immature Granulocytes: 0 %
Lymphocytes Absolute: 1.8 10*3/uL (ref 0.7–3.1)
Lymphs: 20 %
MCH: 30.1 pg (ref 26.6–33.0)
MCHC: 33.7 g/dL (ref 31.5–35.7)
MCV: 90 fL (ref 79–97)
Monocytes Absolute: 0.7 10*3/uL (ref 0.1–0.9)
Monocytes: 8 %
Neutrophils Absolute: 6.2 10*3/uL (ref 1.4–7.0)
Neutrophils: 67 %
Platelets: 233 10*3/uL (ref 150–450)
RBC: 5.31 x10E6/uL (ref 4.14–5.80)
RDW: 12.6 % (ref 11.6–15.4)
WBC: 9.2 10*3/uL (ref 3.4–10.8)

## 2021-08-06 LAB — HIV ANTIBODY (ROUTINE TESTING W REFLEX): HIV Screen 4th Generation wRfx: NONREACTIVE

## 2021-08-06 LAB — LIPID PANEL
Chol/HDL Ratio: 3.7 ratio (ref 0.0–5.0)
Cholesterol, Total: 203 mg/dL — ABNORMAL HIGH (ref 100–199)
HDL: 55 mg/dL (ref >39)
LDL Chol Calc (NIH): 132 mg/dL — ABNORMAL HIGH (ref 0–99)
Triglycerides: 88 mg/dL (ref 0–149)
VLDL Cholesterol Cal: 16 mg/dL (ref 5–40)

## 2021-08-06 LAB — HEPATITIS C ANTIBODY: Hep C Virus Ab: NONREACTIVE

## 2021-08-06 LAB — PSA, TOTAL AND FREE
PSA, Free Pct: 13.8 %
PSA, Free: 0.18 ng/mL
Prostate Specific Ag, Serum: 1.3 ng/mL (ref 0.0–4.0)

## 2021-08-09 ENCOUNTER — Telehealth: Payer: Self-pay | Admitting: Family Medicine

## 2021-08-09 NOTE — Telephone Encounter (Signed)
PT R/C 

## 2021-08-09 NOTE — Progress Notes (Signed)
Subjective:   Patient ID: Jacob Reynolds, male   DOB: 56 y.o.   MRN: [id number]   HPI 56 year old male presents the office today with concerns of right foot plantar wart as well as pain in the bottom of his left heel.  He also has fungus on his nails.  He states his foot pain symptoms are about 2 years ago.  Describes pain as sharp to his left heel.  Walking aggravates his symptoms at times.  He states he had a wart on his right side cut out about 3 times a point on the fifth metatarsal head area comes back.  Denies any open sores No redness or drainage.  No other concerns.   Review of Systems  All other systems reviewed and are negative.  Past Medical History:  Diagnosis Date   Plantar fasciitis     No past surgical history on file.   Current Outpatient Medications:    ciclopirox (PENLAC) 8 % solution, Apply topically at bedtime. Apply over nail and surrounding skin. Apply daily over previous coat. After seven (7) days, may remove with alcohol and continue cycle., Disp: 6.6 mL, Rfl: 0   meloxicam (MOBIC) 15 MG tablet, Take 1 tablet (15 mg total) by mouth daily as needed for pain., Disp: 30 tablet, Rfl: 0   cyclobenzaprine (FLEXERIL) 10 MG tablet, Take 1 tablet (10 mg total) by mouth 3 (three) times daily as needed for muscle spasms., Disp: 90 tablet, Rfl: 1  Allergies  Allergen Reactions   Iodine     Throat felt like closed          Objective:  Physical Exam  General: AAO x3, NAD  Dermatological: Nails are hypertrophic, dystrophic with yellow discoloration.  The right foot submetatarsal 5 with hyperkeratotic lesion.  Upon debridement there is no underlying ulceration drainage or any signs of infection noted.  There are no open lesions.  I believe the lesion is more of a hyperkeratotic lesion as opposed to verruca on the right foot.  Vascular: Dorsalis Pedis artery and Posterior Tibial artery pedal pulses are 2/4 bilateral with immedate capillary fill time.  There is no pain  with calf compression, swelling, warmth, erythema.   Neruologic: Grossly intact via light touch bilateral.  Negative Tinel sign.  Musculoskeletal: Prominence of metatarsal head submetatarsal 5 right foot.  On the left side there is tenderness palpation on plantar aspect calcaneus and insertion of plantar fascia.  There is no pain with lateral compression of calcaneus.  Muscular strength 5/5 in all groups tested bilateral.  Gait: Unassisted, Nonantalgic.       Assessment:   56 year old male with Plantar fasciitis, prominent metatarsal head, onychomycosis     Plan:  -Treatment options discussed including all alternatives, risks, and complications -Etiology of symptoms were discussed -X-rays were obtained and reviewed with the patient.  Calcaneonavicular coalition is noted.  There is no evidence of acute fracture. -For the nail fungus prescribed Penlac -Sharp debrided the hyperkeratotic lesion right foot without any complications or bleeding.  Recommend offloading.  Recommended orthotics. Follow-up with our orthotist, Aaron Edelman. -For the heel pain we discussed stretching, icing daily.  Discussed shoe modifications and again orthotics.  Prescribed meloxicam is without effect.    Trula Slade DPM

## 2021-08-10 NOTE — Telephone Encounter (Signed)
Pt wants to speak with provider or nurse today to get his lab results. Says he has been waiting for someone to call him with results but no one has called.

## 2021-08-10 NOTE — Telephone Encounter (Signed)
Lmtcb.

## 2021-08-12 NOTE — Telephone Encounter (Signed)
Labs were discussed with patient on 08/11/21. ?

## 2021-08-16 ENCOUNTER — Encounter (HOSPITAL_COMMUNITY): Payer: Self-pay

## 2021-08-16 NOTE — Progress Notes (Signed)
Received referral for initial lung cancer screening scan. Attempted to reach patient regarding referral. Unable to reach patient and no VM box available. ?

## 2021-08-16 NOTE — Progress Notes (Signed)
Received referral for initial lung cancer screening scan. Contacted patient and obtained smoking history (started age 56, current smoker continuing to smoke 1PPD, 38 pack year) as well as answering questions related to the screening process. Patient denies signs/symptoms of lung cancer such as weight loss or hemoptysis. Patient denies comorbidity that would prevent curative treatment if lung cancer were to be found. Patient is scheduled for shared decision making visit and CT scan on 08/31/2021 at 1115.   ?

## 2021-08-30 NOTE — Progress Notes (Signed)
Jacob Reynolds  ?Visit Date: 08/31/2021  ?Visit Type: In-Person at [facility] ? ?LUNG CANCER SCREENING SHARED DECISION-MAKING VISIT ?- Age: 56 y.o.  ?- Pack year smoking history: 38 pack-years  ?- Type of tobacco abuse: Cigarettes ?- Current smoker or < 15 years of cessation: Current, 1 PPD ?- No current symptoms of lung cancer:  Patient denies any hemoptysis, unintentional weight loss, and unexplained cough ?- Risks and benefits of lung cancer screening discussed: ?Negative: Over-diagnosis, radiation exposure, false positives, and additional testing ?Positive: Discover early stage lung cancer resulting in higher incidence of cure ?- Patient educated regarding the importance of adherence to continued lung cancer screening. ?- Currently, there are no co-morbidities to prevent treatment to therapy for lung cancer and the patient is agreeable to pursue treatment if a malignancy is discovered. ? ?[place] Preventative Services Task Force recommend annual screening for lung cancer with low-dose CT in adults aged 85 - 48 years who have a 20+ pack year smoking history and currently smoke or have quit smoking within the past 15 years.  Screening should be discontinued once a person has not smoked for 15 years or develops a health problem that substantially limits life expectancy or the ability or willingness to have curative lung surgery.  It is a category B recommendation.  Similar stances are provided by CMS, NCCN, and AATS. ? ?Social History  ? ?Tobacco Use  ? Smoking status: Every Day  ?  Packs/day: 1.00  ?  Years: 36.00  ?  Pack years: 36.00  ?  Types: Cigarettes  ?  Start date: 06/13/1978  ? Smokeless tobacco: Never  ?Substance Use Topics  ? Alcohol use: Yes  ?  Alcohol/week: 7.0 standard drinks  ?  Types: 7 Cans of beer per week  ? Drug use: Yes  ?  Frequency: 1.0 times per week  ?  Types: Marijuana  ?  ? ?Personal history of tobacco use presenting hazards to health: ?- This patient meets criteria for low-dose  CT lung cancer screening  ?- The shared decision making visit discussion included risks and benefits of screening, potential for follow-up, diagnostic testing for abnormal scans, potential for false positive tests, overdiagnosis, discussion about total radiation exposure ?- Patient stated willingness to undergo diagnostics and treatment as needed ?- Patient was counseled on smoking cessation to decrease the  risk of lung cancer, pulmonary disease, heart disease, and stroke ?- Patient has been referred to Lung Cancer Screening Nurse Coordinator for further scheduling of LDCT and for further resources regarding free nicotine replacement therapy and information about smoking cessation classes ?- Counseling on the importance of adherence to annual lung cancer LDCT screening, impact of co-morbidities, and ability or willingness to undergo diagnosis and treatment is imperative for compliance of the program. ?- Counseling on the importance of continued smoking cessation for former smokers; the importance of smoking cessation for current smokers, and information about tobacco cessation interventions have been given to patient including [place] and 1-800-quit [place] programs. ? ?Yearly follow up will be coordinated by Adonis Huguenin, RN, MSN [facility] Oncology Nurse Navigator.) ? ?[name], PA-C  ?08/31/21 11:27 AM  ? ?

## 2021-08-31 ENCOUNTER — Ambulatory Visit (HOSPITAL_COMMUNITY)
Admission: RE | Admit: 2021-08-31 | Discharge: 2021-08-31 | Disposition: A | Discharge status: Home / Self Care | Payer: Commercial Managed Care - PPO | Source: Ambulatory Visit | Attending: Family Medicine | Admitting: Family Medicine

## 2021-08-31 ENCOUNTER — Inpatient Hospital Stay (HOSPITAL_COMMUNITY): Payer: Commercial Managed Care - PPO | Attending: Physician Assistant | Admitting: Physician Assistant

## 2021-08-31 ENCOUNTER — Other Ambulatory Visit: Payer: Self-pay

## 2021-08-31 DIAGNOSIS — Z122 Encounter for screening for malignant neoplasm of respiratory organs: Secondary | ICD-10-CM | POA: Diagnosis present

## 2021-08-31 DIAGNOSIS — Z87891 Personal history of nicotine dependence: Secondary | ICD-10-CM | POA: Diagnosis not present

## 2021-08-31 NOTE — Patient Instructions (Signed)
You were seen today for your shared decision making visit and a low-dose CT scan for lung cancer screening.    ° °Thank you for your participation in this life-saving program! °

## 2021-09-01 ENCOUNTER — Encounter: Payer: Self-pay | Admitting: *Deleted

## 2021-09-07 ENCOUNTER — Telehealth: Payer: Self-pay | Admitting: Family Medicine

## 2021-09-07 DIAGNOSIS — I700 Atherosclerosis of aorta: Secondary | ICD-10-CM

## 2021-09-07 DIAGNOSIS — J439 Emphysema, unspecified: Secondary | ICD-10-CM

## 2021-09-07 DIAGNOSIS — N2889 Other specified disorders of kidney and ureter: Secondary | ICD-10-CM

## 2021-09-07 DIAGNOSIS — R93429 Abnormal radiologic findings on diagnostic imaging of unspecified kidney: Secondary | ICD-10-CM

## 2021-09-07 NOTE — Telephone Encounter (Signed)
Per CT scan from 08/31/21 pt would like to have the renal ultrasound.  ?

## 2021-09-07 NOTE — Telephone Encounter (Signed)
Renal US ordered for AP ?

## 2021-09-15 ENCOUNTER — Ambulatory Visit (HOSPITAL_COMMUNITY)
Admission: RE | Admit: 2021-09-15 | Discharge: 2021-09-15 | Disposition: A | Discharge status: Home / Self Care | Payer: Commercial Managed Care - PPO | Source: Ambulatory Visit | Attending: Family Medicine | Admitting: Family Medicine

## 2021-09-15 DIAGNOSIS — N2889 Other specified disorders of kidney and ureter: Secondary | ICD-10-CM | POA: Insufficient documentation

## 2021-09-15 DIAGNOSIS — R93429 Abnormal radiologic findings on diagnostic imaging of unspecified kidney: Secondary | ICD-10-CM | POA: Insufficient documentation

## 2021-09-20 ENCOUNTER — Telehealth: Payer: Self-pay | Admitting: Family Medicine

## 2021-09-20 ENCOUNTER — Other Ambulatory Visit: Payer: Self-pay | Admitting: Family Medicine

## 2021-09-20 DIAGNOSIS — N2889 Other specified disorders of kidney and ureter: Secondary | ICD-10-CM

## 2021-09-20 NOTE — Telephone Encounter (Signed)
Spoke with patient myself via telephone regarding results and recommendation for MRI for follow up. Will order stat MRI.  ?

## 2021-09-20 NOTE — Telephone Encounter (Signed)
Please review and advise.

## 2021-09-22 ENCOUNTER — Ambulatory Visit (HOSPITAL_COMMUNITY)
Admission: RE | Admit: 2021-09-22 | Discharge: 2021-09-22 | Disposition: A | Discharge status: Home / Self Care | Payer: Commercial Managed Care - PPO | Source: Ambulatory Visit | Attending: Family Medicine | Admitting: Family Medicine

## 2021-09-22 DIAGNOSIS — N2889 Other specified disorders of kidney and ureter: Secondary | ICD-10-CM | POA: Diagnosis present

## 2021-09-22 MED ORDER — GADOBUTROL 1 MMOL/ML IV SOLN
7.0000 mL | Freq: Once | INTRAVENOUS | Status: AC | PRN
  Administered 2021-09-22: 7 mL via INTRAVENOUS

## 2021-09-23 ENCOUNTER — Telehealth: Payer: Self-pay | Admitting: Family Medicine

## 2021-09-23 ENCOUNTER — Ambulatory Visit (INDEPENDENT_AMBULATORY_CARE_PROVIDER_SITE_OTHER): Payer: Commercial Managed Care - PPO | Admitting: Family Medicine

## 2021-09-23 ENCOUNTER — Encounter: Payer: Self-pay | Admitting: Family Medicine

## 2021-09-23 DIAGNOSIS — M899 Disorder of bone, unspecified: Secondary | ICD-10-CM | POA: Diagnosis not present

## 2021-09-23 DIAGNOSIS — N2889 Other specified disorders of kidney and ureter: Secondary | ICD-10-CM | POA: Diagnosis not present

## 2021-09-23 NOTE — Progress Notes (Signed)
? ?  Virtual Visit  Note ?Due to COVID-19 pandemic this visit was conducted virtually. This visit type was conducted due to national recommendations for restrictions regarding the COVID-19 Pandemic (e.g. social distancing, sheltering in place) in an effort to limit this patient's exposure and mitigate transmission in our community. All issues noted in this document were discussed and addressed.  A physical exam was not performed with this format. ? ?I connected with Jacob Reynolds on 09/23/21 at 1347 by telephone and verified that I am speaking with the correct person using two identifiers. Jacob Reynolds is currently located at home and no one is currently with him during the visit. The provider, [name], FNP is located in their office at time of visit. ? ?I discussed the limitations, risks, security and privacy concerns of performing an evaluation and management service by telephone and the availability of in person appointments. I also discussed with the patient that there may be a patient responsible charge related to this service. The patient expressed understanding and agreed to proceed. ? ?CC: MRI results ? ?History and Present Illness: ? ?HPI ?Merit scheduled a telephone appointment to discussed the results of the MRI scan. He preferred this to an in personal appointment.  ? ? ?ROS ?As per HPI.  ? ?Observations/Objective: ?Alert and oriented x 3. Able to speak in full sentences without difficulty.  ? ?Assessment and Plan: ?[name] was seen today for mri results. ? ?Diagnoses and all orders for this visit: ? ?Bone lesion ?Renal mass ?Discussed results of MRI with left renal mass concerning for carcinoma. Discussed lesions in the lower spine and sacrum that are also concerning for metastatic disease. Will order stat PET to assess further. STAT referral to oncology for further evaluation and management. Patient agrees with plan and voices understanding. Questions answered.  ?-     NM PET ([facility])  WHOLE BODY; Future ?-     Ambulatory referral to Hematology / Oncology ? ? ? ? ?Follow Up Instructions: ?Follow up with oncology. Will notify patient of PET scan results when available.  ? ?  ?I discussed the assessment and treatment plan with the patient. The patient was provided an opportunity to ask questions and all were answered. The patient agreed with the plan and demonstrated an understanding of the instructions. ?  ?The patient was advised to call back or seek an in-person evaluation if the symptoms worsen or if the condition fails to improve as anticipated. ? ?The above assessment and management plan was discussed with the patient. The patient verbalized understanding of and has agreed to the management plan. Patient is aware to call the clinic if symptoms persist or worsen. Patient is aware when to return to the clinic for a follow-up visit. Patient educated on when it is appropriate to go to the emergency department.  ? ? ?I provided 9 minutes of  non face-to-face time during this encounter. ? ? ? ?[name], FNP ? ? ?

## 2021-09-23 NOTE — Telephone Encounter (Signed)
Tried to call patient back again NA when patient calls back he needs to be seen in person or televisit with [place] to discuss MRI get Tiffany or her nurse so they can see when to work him in. ?

## 2021-09-27 ENCOUNTER — Other Ambulatory Visit: Payer: Self-pay | Admitting: Family Medicine

## 2021-09-27 DIAGNOSIS — M899 Disorder of bone, unspecified: Secondary | ICD-10-CM

## 2021-09-27 DIAGNOSIS — N2889 Other specified disorders of kidney and ureter: Secondary | ICD-10-CM

## 2021-09-28 NOTE — Telephone Encounter (Signed)
Pt was seen for this, will close encounter. ?

## 2021-10-04 ENCOUNTER — Ambulatory Visit: Payer: Commercial Managed Care - PPO | Admitting: Podiatry

## 2021-10-07 ENCOUNTER — Ambulatory Visit (HOSPITAL_COMMUNITY)
Admission: RE | Admit: 2021-10-07 | Discharge: 2021-10-07 | Disposition: A | Discharge status: Home / Self Care | Payer: Commercial Managed Care - PPO | Source: Ambulatory Visit | Attending: Family Medicine | Admitting: Family Medicine

## 2021-10-07 DIAGNOSIS — N2889 Other specified disorders of kidney and ureter: Secondary | ICD-10-CM | POA: Diagnosis present

## 2021-10-07 DIAGNOSIS — M899 Disorder of bone, unspecified: Secondary | ICD-10-CM | POA: Diagnosis present

## 2021-10-07 MED ORDER — FLUDEOXYGLUCOSE F - 18 (FDG) INJECTION
8.6000 | Freq: Once | INTRAVENOUS | Status: AC | PRN
  Administered 2021-10-07: 8.6 via INTRAVENOUS

## 2021-10-08 ENCOUNTER — Ambulatory Visit (INDEPENDENT_AMBULATORY_CARE_PROVIDER_SITE_OTHER): Payer: Commercial Managed Care - PPO | Admitting: Family Medicine

## 2021-10-08 ENCOUNTER — Encounter: Payer: Self-pay | Admitting: Family Medicine

## 2021-10-08 NOTE — Progress Notes (Signed)
Patient came in today to discuss results of PET scan. 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 He denies weakness or changes in gait. He has been having some back pain for the last few years. He has an appt with Dr. Delton Coombes on 10/12/21. Encouraged patient to bring a family member if possible to that appt. Discussed that Dr. Delton Coombes may need to do further testing and will discuss prognosis and any possible treatment options with him. Patient took news well. He does have family available to call. Also discussed findings of aortic and coronary atherosclerosis, COPD, and sinus disease. Discussed he can schedule an appointment at any time to discuss treatment for this these if he would like. He denies shortness of breath or chest pain. All questions answered. Encouraged patient to call with any questions.  ? ? ?

## 2021-10-12 ENCOUNTER — Inpatient Hospital Stay (HOSPITAL_COMMUNITY): Payer: Commercial Managed Care - PPO | Attending: Physician Assistant | Admitting: Hematology

## 2021-10-12 VITALS — BP 126/79 | HR 65 | Temp 98.7°F | Resp 18 | Ht 71.0 in | Wt 159.3 lb

## 2021-10-12 DIAGNOSIS — F17210 Nicotine dependence, cigarettes, uncomplicated: Secondary | ICD-10-CM | POA: Diagnosis not present

## 2021-10-12 DIAGNOSIS — C642 Malignant neoplasm of left kidney, except renal pelvis: Secondary | ICD-10-CM | POA: Insufficient documentation

## 2021-10-12 DIAGNOSIS — Z79899 Other long term (current) drug therapy: Secondary | ICD-10-CM | POA: Diagnosis not present

## 2021-10-12 DIAGNOSIS — H538 Other visual disturbances: Secondary | ICD-10-CM | POA: Insufficient documentation

## 2021-10-12 DIAGNOSIS — Z8042 Family history of malignant neoplasm of prostate: Secondary | ICD-10-CM | POA: Diagnosis not present

## 2021-10-12 DIAGNOSIS — Z801 Family history of malignant neoplasm of trachea, bronchus and lung: Secondary | ICD-10-CM | POA: Insufficient documentation

## 2021-10-12 DIAGNOSIS — Z803 Family history of malignant neoplasm of breast: Secondary | ICD-10-CM | POA: Diagnosis not present

## 2021-10-12 DIAGNOSIS — C7951 Secondary malignant neoplasm of bone: Secondary | ICD-10-CM | POA: Insufficient documentation

## 2021-10-12 DIAGNOSIS — M899 Disorder of bone, unspecified: Secondary | ICD-10-CM

## 2021-10-12 DIAGNOSIS — N2889 Other specified disorders of kidney and ureter: Secondary | ICD-10-CM

## 2021-10-12 DIAGNOSIS — M545 Low back pain, unspecified: Secondary | ICD-10-CM | POA: Diagnosis not present

## 2021-10-12 NOTE — Patient Instructions (Addendum)
[place] at [facility] ?Discharge Instructions ? ?You were seen and examined today by Dr. Delton Coombes. Dr. Delton Coombes is a medical oncologist, meaning that he specializes in the treatment of cancer diagnoses. Dr. Delton Coombes discussed your past medical history, family history of cancers, and the events that led to you being here today. ? ?You were referred to Dr. Delton Coombes due to an abnormal finding on a recent CT scan that led to a PET scan. Dr. Delton Coombes reviewed your PET scan which is concerning for renal cell carcinoma, a cancer arising from the kidney. It does appear that this has started in your left kidney and spread beyond the kidney and to the lymph nodes and bones. ? ?Based on these findings, Dr. Delton Coombes has recommended a biopsy in order to identify the exact type of cancer. ? ?Based on the findings of the PET scan, it does appear to be a Stage IV cancer. This means that it cannot be cured but it can be controlled with treatment. ? ?Follow-up as scheduled after biopsy. ? ? ?Thank you for choosing [place] at [facility] to provide your oncology and hematology care.  To afford each patient quality time with our provider, please arrive at least 15 minutes before your scheduled appointment time.  ? ?If you have a lab appointment with the [place] please come in thru the Main Entrance and check in at the main information desk. ? ?You need to re-schedule your appointment should you arrive 10 or more minutes late.  We strive to give you quality time with our providers, and arriving late affects you and other patients whose appointments are after yours.  Also, if you no show three or more times for appointments you may be dismissed from the clinic at the providers discretion.     ?Again, thank you for choosing [facility].  Our hope is that these requests will decrease the amount of time that you wait before being seen by our physicians.        ?_____________________________________________________________ ? ?Should you have questions after your visit to [facility], please contact our office at [phone] and follow the prompts.  Our office hours are 8:00 a.m. and 4:30 p.m. Monday - Friday.  Please note that voicemails left after 4:00 p.m. may not be returned until the following business day.  We are closed weekends and major holidays.  You do have access to a nurse 24-7, just call the main number to the clinic [phone] and do not press any options, hold on the line and a nurse will answer the phone.   ? ?For prescription refill requests, have your pharmacy contact our office and allow 72 hours.   ? ?Due to Covid, you will need to wear a mask upon entering the hospital. If you do not have a mask, a mask will be given to you at the Main Entrance upon arrival. For doctor visits, patients may have 1 support person age 3 or older with them. For treatment visits, patients can not have anyone with them due to social distancing guidelines and our immunocompromised population.  ? ? ? ?

## 2021-10-12 NOTE — Progress Notes (Signed)
? ?[place] ?[address] ?[place], [place] [address] ? ? ?CLINIC:  ?Medical Oncology/Hematology ? ?CONSULT NOTE ? ?Patient Care Team: ?[name], FNP as PCP - General (Family Medicine) ?Brien Mates, RN as Oncology Nurse Navigator (Medical Oncology) ?Derek Jack, MD as Medical Oncologist (Medical Oncology) ? ?CHIEF COMPLAINTS/PURPOSE OF CONSULTATION:  ?Evaluation of renal mass and bone lesion ? ?HISTORY OF PRESENTING ILLNESS:  ?Jacob Reynolds 56 y.o. male is here because of evaluation of renal mass and bone lesion, at the request of [facility]. ? ?Today he reports feeling good. He reports lower back pain which has been present for over a year for which he had been taking Flexeril prn.  He also reports pain in his tailbone upon sitting for long periods of time. He denies taking OTC pain medication. He reports occasional abdominal pain which started recently. He has lost 10 lbs in the past 3 months. He denies fevers and night sweats. He reports blurry vision. He reports he has been eating poorly over the past few weeks. He reports 1 episode of dizziness last night. He denies hematuria. He denies history of CVA or MI. He denies pain at his left clavicle. He denies headaches, nausea, and double vision.  ? ?He currently lives at home with his nephew. He currently works at [employer] in [place] where he has worked for 10 years. 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 He currently smokes 1 ppd started smoking at 56 year old. He drinks 1 beer a night. His maternal aunt had breast cancer, one maternal cousin had prostate cancer, another maternal cousin had breast cancer, his paternal uncle had lung cancer.  ? ?MEDICAL HISTORY:  ?Past Medical History:  ?Diagnosis Date  ? Plantar fasciitis   ? ? ?SURGICAL HISTORY: ?No past surgical history on file. ? ?SOCIAL HISTORY: ?Social History  ? ?Socioeconomic History  ? Marital status: Single  ?  Spouse name: Not on file   ? Number of children: 1  ? Years of education: 74  ? Highest education level: 11th grade  ?Occupational History  ? Not on file  ?Tobacco Use  ? Smoking status: Every Day  ?  Packs/day: 1.00  ?  Years: 36.00  ?  Pack years: 36.00  ?  Types: Cigarettes  ?  Start date: 06/13/1978  ? Smokeless tobacco: Never  ?Substance and Sexual Activity  ? Alcohol use: Yes  ?  Alcohol/week: 7.0 standard drinks  ?  Types: 7 Cans of beer per week  ? Drug use: Yes  ?  Frequency: 1.0 times per week  ?  Types: Marijuana  ? Sexual activity: Not on file  ?Other Topics Concern  ? Not on file  ?Social History Narrative  ? Not on file  ? ?Social Determinants of Health  ? ?Financial Resource Strain: Not on file  ?Food Insecurity: Not on file  ?Transportation Needs: Not on file  ?Physical Activity: Not on file  ?Stress: Not on file  ?Social Connections: Not on file  ?Intimate Partner Violence: Not on file  ? ? ?FAMILY HISTORY: ?Family History  ?Problem Relation Age of Onset  ? Hypertension Mother   ? Parkinson's disease Father   ? ? ?ALLERGIES:  ?is allergic to iodine. ? ?MEDICATIONS:  ?Current Outpatient Medications  ?Medication Sig Dispense Refill  ? ciclopirox (PENLAC) 8 % solution Apply topically at bedtime. Apply over nail and surrounding skin. Apply daily over previous coat. After seven (7) days, may remove with alcohol  and continue cycle. 6.6 mL 0  ? cyclobenzaprine (FLEXERIL) 10 MG tablet Take 1 tablet (10 mg total) by mouth 3 (three) times daily as needed for muscle spasms. 90 tablet 1  ? meloxicam (MOBIC) 15 MG tablet Take 1 tablet (15 mg total) by mouth daily as needed for pain. 30 tablet 0  ? ?No current facility-administered medications for this visit.  ? ? ?REVIEW OF SYSTEMS:   ?Review of Systems  ?Constitutional:  Positive for appetite change and unexpected weight change. Negative for fatigue and fever.  ?Eyes:  Positive for eye problems.  ?Gastrointestinal:  Positive for abdominal pain (5/10). Negative for nausea.  ?Endocrine:  Negative for hot flashes.  ?Genitourinary:  Negative for hematuria.   ?Musculoskeletal:  Positive for back pain (5/10).  ?Neurological:  Positive for dizziness (x1). Negative for headaches.  ?All other systems reviewed and are negative. ? ? ?PHYSICAL EXAMINATION: ?ECOG PERFORMANCE STATUS: 0 - Asymptomatic ? ?Vitals:  ? 10/12/21 1345  ?BP: 126/79  ?Pulse: 65  ?Resp: 18  ?Temp: 98.7 ?F (37.1 ?C)  ?SpO2: 99%  ? ?Filed Weights  ? 10/12/21 1345  ?Weight: 159 lb 4.8 oz (72.3 kg)  ? ?Physical Exam ?Vitals reviewed.  ?Constitutional:   ?   Appearance: Normal appearance.  ?Cardiovascular:  ?   Rate and Rhythm: Normal rate and regular rhythm.  ?   Pulses: Normal pulses.  ?   Heart sounds: Normal heart sounds.  ?Pulmonary:  ?   Effort: Pulmonary effort is normal.  ?   Breath sounds: Normal breath sounds.  ?Abdominal:  ?   Palpations: Abdomen is soft. There is no mass.  ?   Tenderness: There is no abdominal tenderness.  ?Musculoskeletal:  ?   Thoracic back: No tenderness.  ?   Lumbar back: No tenderness.  ?   Right lower leg: No edema.  ?   Left lower leg: No edema.  ?Lymphadenopathy:  ?   Upper Body:  ?   Right upper body: Axillary adenopathy (multiple lymph nodes 1-2 cm) present. No supraclavicular adenopathy.  ?   Left upper body: Axillary adenopathy (1 lymph node 1 cm) present. No supraclavicular adenopathy.  ?   Lower Body: No right inguinal adenopathy. No left inguinal adenopathy.  ?Neurological:  ?   General: No focal deficit present.  ?   Mental Status: He is alert and oriented to person, place, and time.  ?Psychiatric:     ?   Mood and Affect: Mood normal.     ?   Behavior: Behavior normal.  ? ? ? ?LABORATORY DATA:  ?I have reviewed the data as listed ? ?  Latest Ref Rng & Units 08/05/2021  ?  8:50 AM 04/29/2013  ?  3:10 PM  ?CBC  ?WBC 3.4 - 10.8 x10E3/uL 9.2   9.0    ?Hemoglobin 13.0 - 17.7 g/dL 16.0   14.7    ?Hematocrit 37.5 - 51.0 % 47.5   43.7    ?Platelets 150 - 450 x10E3/uL 233     ? ? ?  Latest Ref Rng & Units  08/05/2021  ?  8:50 AM 09/05/2015  ?  9:55 AM 04/29/2013  ?  2:44 PM  ?CMP  ?Glucose 70 - 99 mg/dL 95   105   78    ?BUN 6 - 24 mg/dL '23   18   17    '$ ?Creatinine 0.76 - 1.27 mg/dL 0.88   0.84   0.99    ?Sodium 134 - 144 mmol/L 141  140   142    ?Potassium 3.5 - 5.2 mmol/L 4.5   4.5   4.2    ?Chloride 96 - 106 mmol/L 104   103   99    ?CO2 20 - 29 mmol/L '21   22   28    '$ ?Calcium 8.7 - 10.2 mg/dL 9.5   9.3   9.7    ?Total Protein 6.0 - 8.5 g/dL 7.6   7.4   7.0    ?Total Bilirubin 0.0 - 1.2 mg/dL 0.4   0.5   0.2    ?Alkaline Phos 44 - 121 IU/L 101   93   75    ?AST 0 - 40 IU/L '21   21   17    '$ ?ALT 0 - 44 IU/L '21   26   15    '$ ? ? ?RADIOGRAPHIC STUDIES: ?I have personally reviewed the radiological images as listed and agreed with the findings in the report. ?MR Abdomen W Wo Contrast ? ?Result Date: 09/22/2021 ?CLINICAL DATA:  Left renal mass EXAM: MRI ABDOMEN WITHOUT AND WITH CONTRAST TECHNIQUE: Multiplanar multisequence MR imaging of the abdomen was performed both before and after the administration of intravenous contrast. CONTRAST:  70m GADAVIST GADOBUTROL 1 MMOL/ML IV SOLN COMPARISON:  09/15/2021 FINDINGS: Despite efforts by the technologist and patient, motion artifact is present on today's exam and could not be eliminated. This reduces exam sensitivity and specificity. Lower chest: Mild cardiomegaly. Hepatobiliary: Unremarkable Pancreas:  Unremarkable Spleen:  Unremarkable Adrenals/Urinary Tract: Heterogeneously enhancing mass of the left kidney upper pole measures 6.5 by 5.4 by 5.8 cm on image 46 series 17. No substantial fatty content. 1.3 by 1.4 cm mass of the left adrenal gland on image 41 series 13 has indeterminate characteristics by MRI, however this mass appears to of been partially included on the noncontrast CT of 08/31/2021 with internal density of about 2 Hounsfield units which would tend to favor adenoma. However the lesion is not fully included on that exam. No other renal lesions are observed.  Stomach/Bowel: Unremarkable Vascular/Lymphatic: I do not see clot in the left renal vein. There does appear to be some collateral venous prominence or possibly some scattered small lymph nodes in the left periaor

## 2021-10-14 ENCOUNTER — Encounter: Payer: Self-pay | Admitting: *Deleted

## 2021-10-14 NOTE — Progress Notes (Unsigned)
Jacob Cleveland, MD  [name] ?Ok  ? ?CT core sacral bone biopsy  ?Per SK: collect   extra 4-6 cores (18 G) in neutral buffered formalin (NBF) separately to send to [facility] molecular testing platform.  This should be in addition to the specimen required by pathology.  ? ?DDH ?

## 2021-10-15 ENCOUNTER — Ambulatory Visit (HOSPITAL_COMMUNITY)
Admission: RE | Admit: 2021-10-15 | Discharge: 2021-10-15 | Disposition: A | Discharge status: Home / Self Care | Payer: Commercial Managed Care - PPO | Source: Ambulatory Visit | Attending: Hematology | Admitting: Hematology

## 2021-10-15 ENCOUNTER — Other Ambulatory Visit (HOSPITAL_COMMUNITY): Payer: Self-pay | Admitting: Radiology

## 2021-10-15 DIAGNOSIS — Z452 Encounter for adjustment and management of vascular access device: Secondary | ICD-10-CM | POA: Insufficient documentation

## 2021-10-15 DIAGNOSIS — M899 Disorder of bone, unspecified: Secondary | ICD-10-CM | POA: Diagnosis present

## 2021-10-15 DIAGNOSIS — N2889 Other specified disorders of kidney and ureter: Secondary | ICD-10-CM | POA: Insufficient documentation

## 2021-10-15 DIAGNOSIS — F17210 Nicotine dependence, cigarettes, uncomplicated: Secondary | ICD-10-CM | POA: Diagnosis not present

## 2021-10-15 DIAGNOSIS — C7951 Secondary malignant neoplasm of bone: Secondary | ICD-10-CM | POA: Diagnosis not present

## 2021-10-15 HISTORY — PX: IR IMAGING GUIDED PORT INSERTION: IMG5740

## 2021-10-15 LAB — CBC
HCT: 46.3 % (ref 39.0–52.0)
Hemoglobin: 15.9 g/dL (ref 13.0–17.0)
MCH: 31.3 pg (ref 26.0–34.0)
MCHC: 34.3 g/dL (ref 30.0–36.0)
MCV: 91.1 fL (ref 80.0–100.0)
Platelets: 222 10*3/uL (ref 150–400)
RBC: 5.08 MIL/uL (ref 4.22–5.81)
RDW: 14.3 % (ref 11.5–15.5)
WBC: 10.5 10*3/uL (ref 4.0–10.5)
nRBC: 0 % (ref 0.0–0.2)

## 2021-10-15 MED ORDER — FENTANYL CITRATE (PF) 100 MCG/2ML IJ SOLN
INTRAMUSCULAR | Status: AC | PRN
  Administered 2021-10-15 (×2): 25 ug via INTRAVENOUS

## 2021-10-15 MED ORDER — LIDOCAINE-EPINEPHRINE 1 %-1:100000 IJ SOLN
INTRAMUSCULAR | Status: AC
  Filled 2021-10-15: qty 1

## 2021-10-15 MED ORDER — HYDROCODONE-ACETAMINOPHEN 5-325 MG PO TABS: 1.0000 | ORAL_TABLET | ORAL | Status: DC | PRN

## 2021-10-15 MED ORDER — HEPARIN SOD (PORK) LOCK FLUSH 100 UNIT/ML IV SOLN
INTRAVENOUS | Status: AC
  Filled 2021-10-15: qty 5

## 2021-10-15 MED ORDER — HEPARIN SOD (PORK) LOCK FLUSH 100 UNIT/ML IV SOLN
INTRAVENOUS | Status: AC | PRN
  Administered 2021-10-15: 500 [IU] via INTRAVENOUS

## 2021-10-15 MED ORDER — LIDOCAINE HCL 1 % IJ SOLN
INTRAMUSCULAR | Status: AC
  Filled 2021-10-15: qty 10

## 2021-10-15 MED ORDER — FENTANYL CITRATE (PF) 100 MCG/2ML IJ SOLN
INTRAMUSCULAR | Status: DC
Start: 2021-10-15 — End: 2021-10-15
  Filled 2021-10-15: qty 6

## 2021-10-15 MED ORDER — FENTANYL CITRATE (PF) 100 MCG/2ML IJ SOLN
INTRAMUSCULAR | Status: AC | PRN
  Administered 2021-10-15: 25 ug via INTRAVENOUS
  Administered 2021-10-15: 50 ug via INTRAVENOUS
  Administered 2021-10-15: 25 ug via INTRAVENOUS

## 2021-10-15 MED ORDER — MIDAZOLAM HCL 2 MG/2ML IJ SOLN
INTRAMUSCULAR | Status: AC | PRN
  Administered 2021-10-15 (×2): .5 mg via INTRAVENOUS

## 2021-10-15 MED ORDER — SODIUM CHLORIDE 0.9 % IV SOLN: INTRAVENOUS | Status: DC

## 2021-10-15 MED ORDER — LIDOCAINE HCL 1 % IJ SOLN
INTRAMUSCULAR | Status: AC
  Filled 2021-10-15: qty 20

## 2021-10-15 MED ORDER — MIDAZOLAM HCL 2 MG/2ML IJ SOLN
INTRAMUSCULAR | Status: AC | PRN
  Administered 2021-10-15 (×2): 1 mg via INTRAVENOUS
  Administered 2021-10-15: .5 mg via INTRAVENOUS
  Administered 2021-10-15: 1 mg via INTRAVENOUS

## 2021-10-15 MED ORDER — MIDAZOLAM HCL 2 MG/2ML IJ SOLN
INTRAMUSCULAR | Status: AC
  Filled 2021-10-15: qty 6

## 2021-10-15 NOTE — Procedures (Signed)
?  Procedure:  R IJ Port catheter    ?Preprocedure diagnosis: Diagnoses of Left renal mass and Bone lesion were pertinent to this visit. ? ?Postprocedure diagnosis: same ?EBL:    minimal ?Complications:   none immediate ? ?See full dictation in [facility]. ? ?D. Arne Cleveland MD ?Main # [phone] ?Pager  [phone] ?Mobile [phone] ?  ? ?

## 2021-10-15 NOTE — H&P (Signed)
? ?Chief Complaint: ?Left sided renal mass. Request for sacral biopsy  (initially left renal mass) and portacath placement. ? ?Referring Physician(s): ?Derek Jack ? ?Supervising Physician: Arne Cleveland ? ?Patient Status: [facility] - Out-pt ? ?History of Present Illness: ?Jacob Reynolds is a 56 y.o. male  Smoker. No pertinent medical History of Found to have a left sided renal mass during his Lung cancer screening CT.  Team is requesting left sided renal biopsy (request changed to sacral biopsy) for further evaluation for possible metastatic renal cell carcinoma. PET scan from 4.27.23  reads  Multifocal osseous metastasis. Hypermetabolism corresponding to the central sacral lesion on prior MRI. Measures a S.U.V. max of 15.7 including on 247/3. Hypermetabolic metastasis within the inferior aspect of L3 measures a S.U.V. max of 8.6.with nodal metastases and Port Cath placement for presumed chemotherapy access. Case approved by Dr. DD Vernard Gambles for sacral biopsy who recommends an additional  4-6 cores (18 G) in neutral buffered formalin for [facility] molecular testing platform and portacath placement. ? ?Currently without any significant complaints. Patient alert and laying in bed, calm and comfortable. Denies any fevers, headache, chest pain, SOB, cough, abdominal pain, nausea, vomiting or bleeding. Return precautions and treatment recommendations and follow-up discussed with the patient  who is agreeable with the plan. ? ? ?Past Medical History:  ?Diagnosis Date  ? Plantar fasciitis   ? ? ?No past surgical history on file. ? ?Allergies: ?Iodine ? ?Medications: ?Prior to Admission medications   ?Medication Sig Start Date End Date Taking? Authorizing Provider  ?ciclopirox (PENLAC) 8 % solution Apply topically at bedtime. Apply over nail and surrounding skin. Apply daily over previous coat. After seven (7) days, may remove with alcohol and continue cycle. 08/05/21   Trula Slade, DPM  ?cyclobenzaprine (FLEXERIL)  10 MG tablet Take 1 tablet (10 mg total) by mouth 3 (three) times daily as needed for muscle spasms. 02/01/21   [name], FNP  ?meloxicam (MOBIC) 15 MG tablet Take 1 tablet (15 mg total) by mouth daily as needed for pain. 08/05/21 08/05/22  Trula Slade, DPM  ?  ? ?Family History  ?Problem Relation Age of Onset  ? Hypertension Mother   ? Parkinson's disease Father   ? ? ?Social History  ? ?Socioeconomic History  ? Marital status: Single  ?  Spouse name: Not on file  ? Number of children: 1  ? Years of education: 48  ? Highest education level: 11th grade  ?Occupational History  ? Not on file  ?Tobacco Use  ? Smoking status: Every Day  ?  Packs/day: 1.00  ?  Years: 36.00  ?  Pack years: 36.00  ?  Types: Cigarettes  ?  Start date: 06/13/1978  ? Smokeless tobacco: Never  ?Substance and Sexual Activity  ? Alcohol use: Yes  ?  Alcohol/week: 7.0 standard drinks  ?  Types: 7 Cans of beer per week  ? Drug use: Yes  ?  Frequency: 1.0 times per week  ?  Types: Marijuana  ? Sexual activity: Not on file  ?Other Topics Concern  ? Not on file  ?Social History Narrative  ? Not on file  ? ?Social Determinants of Health  ? ?Financial Resource Strain: Not on file  ?Food Insecurity: Not on file  ?Transportation Needs: Not on file  ?Physical Activity: Not on file  ?Stress: Not on file  ?Social Connections: Not on file  ? ? ?Review of Systems: A 12 point ROS discussed and pertinent positives are indicated  in the HPI above.  All other systems are negative. ? ?Review of Systems  ?Constitutional:  Negative for fever.  ?HENT:  Negative for congestion.   ?Respiratory:  Negative for cough and shortness of breath.   ?Cardiovascular:  Negative for chest pain.  ?Gastrointestinal:  Negative for abdominal pain.  ?Neurological:  Negative for headaches.  ?Psychiatric/Behavioral:  Negative for behavioral problems and confusion.   ? ?Vital Signs: ?BP 118/83   Pulse 64   Temp 97.9 ?F (36.6 ?C) (Oral)   Resp 16   Ht '5\' 11"'$  (1.803 m)   Wt  160 lb (72.6 kg)   SpO2 98%   BMI 22.32 kg/m?  ? ?Physical Exam ?Vitals and nursing note reviewed.  ?Constitutional:   ?   Appearance: He is well-developed.  ?HENT:  ?   Head: Normocephalic.  ?Cardiovascular:  ?   Rate and Rhythm: Normal rate and regular rhythm.  ?   Heart sounds: Normal heart sounds.  ?Pulmonary:  ?   Effort: Pulmonary effort is normal.  ?Musculoskeletal:     ?   General: Normal range of motion.  ?   Cervical back: Normal range of motion.  ?Skin: ?   General: Skin is dry.  ?Neurological:  ?   Mental Status: He is alert and oriented to person, place, and time.  ?Psychiatric:     ?   Mood and Affect: Mood normal.     ?   Behavior: Behavior normal.     ?   Thought Content: Thought content normal.     ?   Judgment: Judgment normal.  ? ? ?Imaging: ?MR Abdomen W Wo Contrast ? ?Result Date: 09/22/2021 ?CLINICAL DATA:  Left renal mass EXAM: MRI ABDOMEN WITHOUT AND WITH CONTRAST TECHNIQUE: Multiplanar multisequence MR imaging of the abdomen was performed both before and after the administration of intravenous contrast. CONTRAST:  84m GADAVIST GADOBUTROL 1 MMOL/ML IV SOLN COMPARISON:  09/15/2021 FINDINGS: Despite efforts by the technologist and patient, motion artifact is present on today's exam and could not be eliminated. This reduces exam sensitivity and specificity. Lower chest: Mild cardiomegaly. Hepatobiliary: Unremarkable Pancreas:  Unremarkable Spleen:  Unremarkable Adrenals/Urinary Tract: Heterogeneously enhancing mass of the left kidney upper pole measures 6.5 by 5.4 by 5.8 cm on image 46 series 17. No substantial fatty content. 1.3 by 1.4 cm mass of the left adrenal gland on image 41 series 13 has indeterminate characteristics by MRI, however this mass appears to of been partially included on the noncontrast CT of 08/31/2021 with internal density of about 2 Hounsfield units which would tend to favor adenoma. However the lesion is not fully included on that exam. No other renal lesions are  observed. Stomach/Bowel: Unremarkable Vascular/Lymphatic: I do not see clot in the left renal vein. There does appear to be some collateral venous prominence or possibly some scattered small lymph nodes in the left periaortic region at the level of the renal vein on image 53 of series 17. The largest of these measures 1.1 cm in short axis on image 49 of series 17. Abdominal aortic atherosclerosis is noted. Other:  No supplemental non-categorized findings. Musculoskeletal: Bony findings are present which in this clinical context are suspicious for osseous metastatic disease. In the L3 vertebral body, a 1.9 cm enhancing lesion on image 41 of series 19 is present along the inferior endplate, possibly violating the inferior endplate, and associated with surrounding marrow enhancement. On image 54 of series 19, we partially visualize a sacral lesion potentially measuring up to  3.8 cm in length or even more, concerning for metastatic focus. There is abnormal inferior endplate edema at the T8 vertebral level on image 45 of series 19 which is nonspecific but which could also be a harbinger of an underlying metastatic lesion. Other small foci enhancement in the bilateral sacral ala, left iliac bone, and right L2 vertebral body are nonspecific but could also be metastatic. IMPRESSION: 1. 6.5 cm left kidney upper pole solid enhancing renal mass favoring renal cell carcinoma. Scattered enhancing bony lesions suspicious for osseous metastatic disease, largest at L3 and in the central sacrum. 2. 1.3 by 1.4 cm mass of the left adrenal gland has indeterminate characteristics by MRI, but on the CT chest seem to have a low density in its visualized portion, which would tend to favor adenoma. However, this lesion was not completely included on the noncontrast CT examination. 3. Venous collateral vessels versus clustered left periaortic lymph nodes at the level of the renal mass. Largest of these is about 1.1 cm in short axis,  potentially reflecting early pathologic adenopathy. I do not see tumor thrombus in the left renal vein. 4. Mild cardiomegaly. 5.  Aortic Atherosclerosis (ICD10-I70.0). Electronically Signed   By: Cindra Eves.D.

## 2021-10-15 NOTE — Procedures (Signed)
?  Procedure:  CT core biopsy sacral lesion 11g and mult coax cores ?Preprocedure diagnosis: RCCa met ?Postprocedure diagnosis: same ?EBL:    minimal ?Complications:   none immediate ? ?See full dictation in [facility]. ? ?D. Arne Cleveland MD ?Main # [phone] ?Pager  [phone] ?Mobile [phone] ?  ? ?

## 2021-10-20 LAB — SURGICAL PATHOLOGY

## 2021-10-22 ENCOUNTER — Encounter: Payer: Self-pay | Admitting: Podiatry

## 2021-11-04 ENCOUNTER — Inpatient Hospital Stay (HOSPITAL_COMMUNITY): Payer: Commercial Managed Care - PPO

## 2021-11-04 ENCOUNTER — Inpatient Hospital Stay (HOSPITAL_BASED_OUTPATIENT_CLINIC_OR_DEPARTMENT_OTHER): Payer: Commercial Managed Care - PPO | Admitting: Hematology

## 2021-11-04 VITALS — BP 135/80 | HR 68 | Temp 97.4°F | Resp 18 | Ht 71.0 in | Wt 160.6 lb

## 2021-11-04 DIAGNOSIS — M899 Disorder of bone, unspecified: Secondary | ICD-10-CM

## 2021-11-04 DIAGNOSIS — C7951 Secondary malignant neoplasm of bone: Secondary | ICD-10-CM | POA: Diagnosis not present

## 2021-11-04 DIAGNOSIS — N2889 Other specified disorders of kidney and ureter: Secondary | ICD-10-CM | POA: Diagnosis not present

## 2021-11-04 DIAGNOSIS — C642 Malignant neoplasm of left kidney, except renal pelvis: Secondary | ICD-10-CM | POA: Diagnosis not present

## 2021-11-04 DIAGNOSIS — C649 Malignant neoplasm of unspecified kidney, except renal pelvis: Secondary | ICD-10-CM | POA: Diagnosis not present

## 2021-11-04 LAB — CBC WITH DIFFERENTIAL/PLATELET
Abs Immature Granulocytes: 0.03 10*3/uL (ref 0.00–0.07)
Basophils Absolute: 0 10*3/uL (ref 0.0–0.1)
Basophils Relative: 0 %
Eosinophils Absolute: 0.2 10*3/uL (ref 0.0–0.5)
Eosinophils Relative: 2 %
HCT: 45.9 % (ref 39.0–52.0)
Hemoglobin: 15.3 g/dL (ref 13.0–17.0)
Immature Granulocytes: 0 %
Lymphocytes Relative: 22 %
Lymphs Abs: 1.8 10*3/uL (ref 0.7–4.0)
MCH: 30.1 pg (ref 26.0–34.0)
MCHC: 33.3 g/dL (ref 30.0–36.0)
MCV: 90.2 fL (ref 80.0–100.0)
Monocytes Absolute: 0.7 10*3/uL (ref 0.1–1.0)
Monocytes Relative: 9 %
Neutro Abs: 5.3 10*3/uL (ref 1.7–7.7)
Neutrophils Relative %: 67 %
Platelets: 188 10*3/uL (ref 150–400)
RBC: 5.09 MIL/uL (ref 4.22–5.81)
RDW: 13.8 % (ref 11.5–15.5)
WBC: 8.1 10*3/uL (ref 4.0–10.5)
nRBC: 0 % (ref 0.0–0.2)

## 2021-11-04 LAB — COMPREHENSIVE METABOLIC PANEL
ALT: 20 U/L (ref 0–44)
AST: 19 U/L (ref 15–41)
Albumin: 4.2 g/dL (ref 3.5–5.0)
Alkaline Phosphatase: 96 U/L (ref 38–126)
Anion gap: 6 (ref 5–15)
BUN: 18 mg/dL (ref 6–20)
CO2: 26 mmol/L (ref 22–32)
Calcium: 9.2 mg/dL (ref 8.9–10.3)
Chloride: 105 mmol/L (ref 98–111)
Creatinine, Ser: 0.82 mg/dL (ref 0.61–1.24)
GFR, Estimated: >60 mL/min (ref >60)
Glucose, Bld: 96 mg/dL (ref 70–99)
Potassium: 3.9 mmol/L (ref 3.5–5.1)
Sodium: 137 mmol/L (ref 135–145)
Total Bilirubin: 0.5 mg/dL (ref 0.3–1.2)
Total Protein: 7.6 g/dL (ref 6.5–8.1)

## 2021-11-04 LAB — LACTATE DEHYDROGENASE: LDH: 144 U/L (ref 98–192)

## 2021-11-04 LAB — MAGNESIUM: Magnesium: 2.3 mg/dL (ref 1.7–2.4)

## 2021-11-04 LAB — TSH: TSH: 0.99 u[IU]/mL (ref 0.350–4.500)

## 2021-11-04 MED ORDER — MELOXICAM 15 MG PO TABS: 15.0000 mg | ORAL_TABLET | Freq: Every day | ORAL | 0 refills | Status: DC | PRN

## 2021-11-04 NOTE — Progress Notes (Signed)
START ON PATHWAY REGIMEN - Renal Cell     A cycle is every 21 days:     Nivolumab      Ipilimumab    A cycle is every 28 days:     Nivolumab   **Always confirm dose/schedule in your pharmacy ordering system**  Patient Characteristics: Stage IV (Unresected T4M0 or Any T, M1)/Metastatic Disease, Clear Cell, First Line, Intermediate or Poor Risk Therapeutic Status: Stage IV (Unresected T4M0 or Any T, M1)/Metastatic Disease Histology: Clear Cell Line of Therapy: First Line Risk Status: Intermediate Risk Intent of Therapy: Non-Curative / Palliative Intent, Discussed with Patient

## 2021-11-04 NOTE — Patient Instructions (Signed)
[place] at [facility] Discharge Instructions  You were seen and examined today by Dr. Delton Coombes.  Dr. Delton Coombes reviewed your recent biopsy which revealed Stage IV Renal Cell Carcinoma, meaning that it is has started in the kidney and spread to the bone.  Stage IV cancer is not a curable cancer, but it can be treated and controlled from spreading.  Dr. Delton Coombes has recommended immunotherapy. This is not chemotherapy, immunotherapy is typically tolerated better than chemotherapy. Immunotherapy stimulates your own immune system to recognize and fight the cancer. The most common side effects are fatigue, dry skin and abnormal thyroid function.  You will receive a combination of two immunotherapy drugs for the first 4 cycles followed by maintenance therapy. You will be on maintenance immunotherapy for the remainder of your life.  Follow-up as scheduled.   Thank you for choosing [place] at [facility] to provide your oncology and hematology care.  To afford each patient quality time with our provider, please arrive at least 15 minutes before your scheduled appointment time.   If you have a lab appointment with the [place] please come in thru the Main Entrance and check in at the main information desk.  You need to re-schedule your appointment should you arrive 10 or more minutes late.  We strive to give you quality time with our providers, and arriving late affects you and other patients whose appointments are after yours.  Also, if you no show three or more times for appointments you may be dismissed from the clinic at the providers discretion.     Again, thank you for choosing [facility].  Our hope is that these requests will decrease the amount of time that you wait before being seen by our physicians.       _____________________________________________________________  Should you have questions after your visit to  [facility] please contact our office at [phone] and follow the prompts.  Our office hours are 8:00 a.m. and 4:30 p.m. Monday - Friday.  Please note that voicemails left after 4:00 p.m. may not be returned until the following business day.  We are closed weekends and major holidays.  You do have access to a nurse 24-7, just call the main number to the clinic [phone] and do not press any options, hold on the line and a nurse will answer the phone.    For prescription refill requests, have your pharmacy contact our office and allow 72 hours.    Due to Covid, you will need to wear a mask upon entering the hospital. If you do not have a mask, a mask will be given to you at the Main Entrance upon arrival. For doctor visits, patients may have 1 support person age 38 or older with them. For treatment visits, patients can not have anyone with them due to social distancing guidelines and our immunocompromised population.

## 2021-11-04 NOTE — Progress Notes (Signed)
Jacob Reynolds, [name]   CLINIC:  Medical Oncology/Hematology  PCP:  [name], [name] [address] [phone]   REASON FOR VISIT:  Follow-up for renal mass and bone lesion  PRIOR THERAPY: none  NGS Results: not done  CURRENT THERAPY: under work-up  BRIEF ONCOLOGIC HISTORY:  Oncology History   No history exists.    CANCER STAGING: Cancer Staging  No matching staging information was found for the patient.  INTERVAL HISTORY:  Jacob Reynolds, a 56 y.o. male, returns for routine follow-up of his renal mass and bone lesion. [name] was last seen on 10/12/2021.   Today he reports feeling good. He denies personal and family history of autoimmune issues. He reports occasional chronic back and stomach pain. He reports continued blurred vision.   REVIEW OF SYSTEMS:  Review of Systems  Constitutional:  Negative for appetite change and fatigue.  Eyes:  Positive for eye problems.  Gastrointestinal:  Positive for abdominal pain.  Musculoskeletal:  Positive for back pain (6/10 lower).  Psychiatric/Behavioral:  Positive for depression.   All other systems reviewed and are negative.  PAST MEDICAL/SURGICAL HISTORY:  Past Medical History:  Diagnosis Date   Plantar fasciitis    Past Surgical History:  Procedure Laterality Date   IR IMAGING GUIDED PORT INSERTION  10/15/2021    SOCIAL HISTORY:  Social History   Socioeconomic History   Marital status: Single    Spouse name: Not on file   Number of children: 1   Years of education: 11   Highest education level: 11th grade  Occupational History   Not on file  Tobacco Use   Smoking status: Every Day    Packs/day: 1.00    Years: 36.00    Pack years: 36.00    Types: Cigarettes    Start date: 06/13/1978   Smokeless tobacco: Never  Substance and Sexual Activity   Alcohol use: Yes    Alcohol/week: 7.0 standard drinks    Types: 7 Cans of beer per  week   Drug use: Yes    Frequency: 1.0 times per week    Types: Marijuana   Sexual activity: Not on file  Other Topics Concern   Not on file  Social History Narrative   Not on file   Social Determinants of Health   Financial Resource Strain: Not on file  Food Insecurity: Not on file  Transportation Needs: Not on file  Physical Activity: Not on file  Stress: Not on file  Social Connections: Not on file  Intimate Partner Violence: Not on file    FAMILY HISTORY:  Family History  Problem Relation Age of Onset   Hypertension Mother    Parkinson's disease Father     CURRENT MEDICATIONS:  Current Outpatient Medications  Medication Sig Dispense Refill   ciclopirox (PENLAC) 8 % solution Apply topically at bedtime. Apply over nail and surrounding skin. Apply daily over previous coat. After seven (7) days, may remove with alcohol and continue cycle. 6.6 mL 0   cyclobenzaprine (FLEXERIL) 10 MG tablet Take 1 tablet (10 mg total) by mouth 3 (three) times daily as needed for muscle spasms. 90 tablet 1   meloxicam (MOBIC) 15 MG tablet Take 1 tablet (15 mg total) by mouth daily as needed for pain. 30 tablet 0   No current facility-administered medications for this visit.    ALLERGIES:  Allergies  Allergen Reactions   Iodine  Throat felt like closed    PHYSICAL EXAM:  Performance status (ECOG): 0 - Asymptomatic  Vitals:   11/04/21 1251  BP: 135/80  Pulse: 68  Resp: 18  Temp: (!) 97.4 F (36.3 C)  SpO2: 98%   Wt Readings from Last 3 Encounters:  11/04/21 160 lb 9.6 oz (72.8 kg)  10/15/21 160 lb (72.6 kg)  10/12/21 159 lb 4.8 oz (72.3 kg)   Physical Exam Vitals reviewed.  Constitutional:      Appearance: Normal appearance.  Cardiovascular:     Rate and Rhythm: Normal rate and regular rhythm.     Pulses: Normal pulses.     Heart sounds: Normal heart sounds.  Pulmonary:     Effort: Pulmonary effort is normal.     Breath sounds: Normal breath sounds.   Neurological:     General: No focal deficit present.     Mental Status: He is alert and oriented to person, place, and time.  Psychiatric:        Mood and Affect: Mood normal.        Behavior: Behavior normal.     LABORATORY DATA:  I have reviewed the labs as listed.     Latest Ref Rng & Units 10/15/2021    8:27 AM 08/05/2021    8:50 AM 04/29/2013    3:10 PM  CBC  WBC 4.0 - 10.5 K/uL 10.5   9.2   9.0    Hemoglobin 13.0 - 17.0 g/dL 15.9   16.0   14.7    Hematocrit 39.0 - 52.0 % 46.3   47.5   43.7    Platelets 150 - 400 K/uL 222   233         Latest Ref Rng & Units 08/05/2021    8:50 AM 09/05/2015    9:55 AM 04/29/2013    2:44 PM  CMP  Glucose 70 - 99 mg/dL 95   105   78    BUN 6 - 24 mg/dL '23   18   17    '$ Creatinine 0.76 - 1.27 mg/dL 0.88   0.84   0.99    Sodium 134 - 144 mmol/L 141   140   142    Potassium 3.5 - 5.2 mmol/L 4.5   4.5   4.2    Chloride 96 - 106 mmol/L 104   103   99    CO2 20 - 29 mmol/L '21   22   28    '$ Calcium 8.7 - 10.2 mg/dL 9.5   9.3   9.7    Total Protein 6.0 - 8.5 g/dL 7.6   7.4   7.0    Total Bilirubin 0.0 - 1.2 mg/dL 0.4   0.5   0.2    Alkaline Phos 44 - 121 IU/L 101   93   75    AST 0 - 40 IU/L '21   21   17    '$ ALT 0 - 44 IU/L '21   26   15      '$ DIAGNOSTIC IMAGING:  I have independently reviewed the scans and discussed with the patient. NM PET Image Initial (PI) Whole Body (F-18 FDG)  Result Date: 10/07/2021 CLINICAL DATA:  Initial treatment strategy for MRI findings suspicious for renal cell carcinoma and bone metastasis. EXAM: NUCLEAR MEDICINE PET WHOLE BODY TECHNIQUE: 8.6 mCi F-18 FDG was injected intravenously. Full-ring PET imaging was performed from the head to foot after the radiotracer. CT data was obtained and used for attenuation  correction and anatomic localization. Fasting blood glucose: 101 mg/dl COMPARISON:  For 1223 abdominal MRI.  CT chest 08/31/2021 FINDINGS: Mediastinal blood pool activity: SUV max 2.0 HEAD/NECK: No abnormal  intracranial hypermetabolism. A low left jugular/supraclavicular node measures 7 mm and a S.U.V. max of 3.1 on 96/3. Incidental CT findings: No cervical adenopathy. Ethmoid air cell and left frontal sinus opacification. CHEST: No pulmonary parenchymal hypermetabolism. A right retrocrural node measures 8 mm and a S.U.V. max of 3.6 on 188/3. Incidental CT findings: Deferred to recent diagnostic CT. Aortic and coronary artery calcification. Centrilobular emphysema with mild biapical pleuroparenchymal scarring. ABDOMEN/PELVIS: The left renal primary is hypermetabolic, involving the upper/interpolar region. Example at a S.U.V. max of 7.1 on 189/3. Mild left adrenal nodularity at up to 1.4 cm. Indeterminate density measurements with low-level hypermetabolism at a S.U.V. max of 2.6. Bilateral abdominal retroperitoneal hypermetabolic nodes. Example in the left periaortic space at up to 9 mm and a S.U.V. max of 4.3 on 199/3. Incidental CT findings: Deferred to recent diagnostic MRI. Abdominal aortic atherosclerosis. Mild prostatomegaly. SKELETON: Multifocal osseous metastasis. Hypermetabolism corresponding to the central sacral lesion on prior MRI. Measures a S.U.V. max of 15.7 including on 247/3. Hypermetabolic metastasis within the inferior aspect of L3 measures a S.U.V. max of 8.6. Hypermetabolic lesions within T8 body, C7 spinous process, and medial right plaque clavicle. These are all relatively CT occult. Incidental CT findings: none EXTREMITIES: No areas of abnormal hypermetabolism. Incidental CT findings: none IMPRESSION: 1. Left renal cell carcinoma with nodal metastasis in the abdominal retroperitoneum, chest, and supraclavicular/low left jugular stations as detailed above. 2. Multifocal osseous metastasis. 3. Equivocal left adrenal nodule with low-level hypermetabolism. Metastatic disease versus adenoma. 4. Aortic atherosclerosis (ICD10-I70.0), coronary artery atherosclerosis and emphysema (ICD10-J43.9). Sinus  disease. Electronically Signed   By: Abigail Miyamoto M.D.   On: 10/07/2021 16:05   CT BIOPSY  Result Date: 10/15/2021 CLINICAL DATA:  Left renal mass. Hypermetabolic sacral lesion. Biopsy requested EXAM: CT GUIDED CORE BIOPSY OF SACRAL BONE LESION ANESTHESIA/SEDATION: Intravenous Fentanyl 150mg and Versed 3.'5mg'$  were administered as conscious sedation during continuous monitoring of the patient's level of consciousness and physiological / cardiorespiratory status by the radiology RN, with a total moderate sedation time of 25 minutes. PROCEDURE: The procedure risks, benefits, and alternatives were explained to the patient. Questions regarding the procedure were encouraged and answered. The patient understands and consents to the procedure. patient placed prone. select axial scans through the sacrum were obtained. The lesion was localized, corresponding to recent PET-CT. The operative field was prepped with chlorhexidinein a sterile fashion, and a sterile drape was applied covering the operative field. A sterile gown and sterile gloves were used for the procedure. Local anesthesia was provided with 1% Lidocaine. 11 gauge cook bone biopsy needle was advanced to the margin of the lesion. Multiple coaxial core samples were obtained, placed centrally in formalin containers for routine surgical pathology and molecular studies. A final core sample through the guide needle itself was obtained. Postprocedure scans show no hemorrhage or other apparent complication. The patient tolerated the procedure well. RADIATION DOSE REDUCTION: This exam was performed according to the departmental dose-optimization program which includes automated exposure control, adjustment of the mA and/or kV according to patient size and/or use of iterative reconstruction technique. COMPLICATIONS: None immediate FINDINGS: Sacral lesion was localized, corresponding to the previous PET-CT findings. Representative core biopsy samples obtained as above.  IMPRESSION: 1. Technically successful CT-guided core biopsy, sacral bone lesion. Electronically Signed   By: DLucrezia Europe  M.D.   On: 10/15/2021 16:18   IR IMAGING GUIDED PORT INSERTION  Result Date: 10/15/2021 CLINICAL DATA:  Left renal mass. Osseous metastases. Needs durable venous access for planned treatment regimen. EXAM: TUNNELED PORT CATHETER PLACEMENT WITH ULTRASOUND AND FLUOROSCOPIC GUIDANCE FLUOROSCOPY: Radiation Exposure Index (as provided by the fluoroscopic device): 2 mGy air Kerma ANESTHESIA/SEDATION: Intravenous Fentanyl 51mg and Versed '1mg'$  were administered as conscious sedation during continuous monitoring of the patient's level of consciousness and physiological / cardiorespiratory status by the radiology RN, with a total moderate sedation time of 16 minutes. TECHNIQUE: The procedure, risks, benefits, and alternatives were explained to the patient. Questions regarding the procedure were encouraged and answered. The patient understands and consents to the procedure. Patency of the right IJ vein was confirmed with ultrasound with image documentation. An appropriate skin site was determined. Skin site was marked. Region was prepped using maximum barrier technique including cap and mask, sterile gown, sterile gloves, large sterile sheet, and Chlorhexidine as cutaneous antisepsis. The region was infiltrated locally with 1% lidocaine. Under real-time ultrasound guidance, the right IJ vein was accessed with a 21 gauge micropuncture needle; the needle tip within the vein was confirmed with ultrasound image documentation. Needle was exchanged over a 018 guidewire for transitional dilator, and vascular measurement was performed. A small incision was made on the right anterior chest wall and a subcutaneous pocket fashioned. The power-injectable port was positioned and its catheter tunneled to the right IJ dermatotomy site. The transitional dilator was exchanged over an Amplatz wire for a peel-away sheath,  through which the port catheter, which had been trimmed to the appropriate length, was advanced and positioned under fluoroscopy with its tip at the cavoatrial junction. Spot chest radiograph confirms good catheter position and no pneumothorax. The port was flushed per protocol. The pocket was closed with deep interrupted and subcuticular continuous 3-0 Monocryl sutures. The incisions were covered with Dermabond then covered with a sterile dressing. The patient tolerated the procedure well. COMPLICATIONS: COMPLICATIONS None immediate IMPRESSION: Technically successful right IJ power-injectable port catheter placement. Ready for routine use. Electronically Signed   By: DLucrezia EuropeM.D.   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  Possible soft tissue fullness in the upper pole of the left kidney. - [facility] (09/15/2021): Mass with solid component in the upper pole of the left kidney measuring 6.1 cm, highly suspicious for RCC. - MRI of the abdomen (09/22/2021): 6.5 cm left kidney upper pole solid enhancing renal mass favoring RCC.  Scattered enhancing bone lesions suspicious for osseous metastatic disease, largest at L3 and in the central sacrum.  1.3 x 1.4 cm mass of the left adrenal gland has indeterminate characteristics by MRI but on CT chest seems to have a low-density favoring adenoma.  No tumor thrombus in the left renal vein. - PET scan (10/07/2021): Hypermetabolic left kidney mass with SUV 7.1.  Bilateral abdominal retroperitoneal hypermetabolic lymph nodes.  Multifocal bone metastasis.  Central sacrum, inferior aspect of L3 vertebral body, T8 body, C7 spinous process and medial right clavicle. 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  Biopsy shows a poorly differentiated neoplasm, IHC positive for CK8/18 (subset), CD10 and  cytokeratin AE 1/3.  Cells are negative for CK20, S100, HMB45, SOX10, calretinin, P504S, prostein, PSA, desmin and CDX2. - IMDC criteria: 1 risk factor with less than 1 year  from time of diagnosis to systemic therapy.  Intermediate risk group    Social/family history: - His nephew lives with him.  He has a 51-year-old daughter who was given for adoption.  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  He is a current active smoker, 1 pack/day for close to 40 years.  He drinks 1 beer at night. - Maternal aunt had breast cancer.  Maternal cousin has prostate cancer and another maternal cousin had breast cancer.  Paternal grandfather had lung cancer.   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 - I have called and talked to our pathologist Dr. Melina Copa.  She thinks it is most likely from clear-cell histology although IHC does not support it. - Based on these findings, I have recommended combination dual immunotherapy with ipilimumab and nivolumab as it is the only known combination to improve survival and sarcomatoid histology. - He does not have any history of autoimmune problems.  We discussed side effects in detail. - He already has port placed.  We will start his first cycle around 11/15/2021. - I will see him back prior to cycle 2 with repeat labs. - We will also consider genetic testing.  NGS test results are pending.  2.  Low back pain: - He has low back pain for the last 1 year and is taking Flexeril as needed. - He will start Mobic 15 mg daily as needed.  3.  Bone metastatic disease: - We will talk to him about initiating on denosumab to decrease SRE at next visit.   Orders placed this encounter:  No orders of the defined types were placed in this  encounter.    Derek Jack, MD [place] [phone]   I, Thana Ates, am acting as a scribe for Dr. Derek Jack.  I, Derek Jack MD, have reviewed the above documentation for accuracy and completeness, and I agree with the above.

## 2021-11-05 LAB — T4: T4, Total: 8.4 ug/dL (ref 4.5–12.0)

## 2021-11-10 ENCOUNTER — Encounter (HOSPITAL_COMMUNITY): Payer: Self-pay | Admitting: Hematology

## 2021-11-10 ENCOUNTER — Encounter (HOSPITAL_COMMUNITY): Payer: Self-pay

## 2021-11-10 DIAGNOSIS — Z95828 Presence of other vascular implants and grafts: Secondary | ICD-10-CM

## 2021-11-10 HISTORY — DX: Presence of other vascular implants and grafts: Z95.828

## 2021-11-10 NOTE — Patient Instructions (Signed)
[name] are diagnosed with metastatic renal cell carcinoma.  You will be treated in the clinic every 3 weeks with a combination of immunotherapy drugs.  Those drugs are Opdivo and Yervoy.  You will receive both drugs every 2 weeks for 4 cycles.  Then you will return to the clinic every 4 weeks to receive Opdivo only.  The intent of treatment is to help control your cancer, keep it from spreading further, and to alleviate any symptoms you may be having related to your disease. You will see the doctor regularly throughout treatment.  We will obtain blood work from you prior to every treatment and monitor your results to make sure it is safe to give your treatment. The doctor monitors your response to treatment by the way you are feeling, your blood work, and by obtaining scans periodically.  There will be wait times while you are here for treatment.  It will take about 30 minutes to 1 hour for your lab work to result.  Then there will be wait times while pharmacy mixes your medications.    Nivolumab (Opdivo)   About This Drug   Nivolumab is used to treat cancer. It is given in the vein (IV).   It helps your immune system work properly to target and kill cancer cells. It will take 30 minutes to infuse.     These are some of the most common or important side effects:    Immune Reactions    This medication stimulates your immune system. Your immune system can attack normal organs and tissues in your body, leading to serious or life threatening complications. It is important to notify your healthcare provider right away if you develop any of the following symptoms:    Diarrhea / Intestinal problems (colitis, inflammation of the bowel): Abdominal pain, diarrhea, cramping, mucus or blood in the stool, dark or tar-like stools, fever. Diarrhea means different things to different people. Any increase in your normal bowel patterns can be defined as diarrhea and  should be reported to your healthcare team.   Skin reactions: Report rash, with or without itching (pruritis), sores in your mouth, blistering or peeling skin, as these can become severe and require treatment with corticosteroids.   Lung problems (pneumonitis, inflammation of the lung): New or worsening cough, shortness of breath, trouble breathing, or chest pain.   Liver problems (hepatitis, inflammation of the liver): Yellowing of the skin or eyes, your urine appears dark or brown, pain in your abdomen, bleeding or bruising more easily than normal, or severe nausea and vomiting.    Brain and/or nerve problems: Report any headache, drooping of eyelids, double vision, trouble swallowing, weakness of arms, legs or face, or numbness or tingling in the hands or feet to your healthcare team.   Hormone abnormalities: Immune reactions can affect the pituitary, thyroid, pancreas and adrenal glands, resulting in inflammation of these glands, which can affect their production of certain hormones. Some hormone levels can be monitored with blood work. It is important that you report any changes in how you are feeling to your care team. Symptoms of these hormonal changes can include: headaches, nausea, vomiting, constipation, rapid heart rate, increased sweating, extreme fatigue, weakness, changes in your voice, changes in memory and concentration, increased hunger or thirst, increased urination, weight gain, hair loss, dizziness, feeling cold all the time, and changes in mood or behavior (including irritability, forgetfulness and decreased sex drive).  Eye problems: Report any changes in vision, blurry or double vision, and eye pain or redness to your healthcare team.  Kidney problems (kidney inflammation or failure): Decreased urine output, blood in the urine, swelling in the ankles, loss of appetite.   Fatigue:  Fatigue is very common during cancer treatment and is an overwhelming feeling of exhaustion that  is not usually relieved by rest. While on cancer treatment, and for a period after, you may need to adjust your schedule to manage fatigue. Plan times to rest during the day and conserve energy for more important activities. Exercise can help combat fatigue; a simple daily walk with a friend can help.  Talk to your healthcare team for helpful tips on dealing with this side effect.   Allergic Reactions:  In some cases, patients can have an allergic reaction to this medication. Signs of a reaction can include: shortness of breath or difficulty breathing, chest pain, rash, flushing or itching or a decrease in blood pressure. If you notice any changes in how you feel during the infusion, let your nurse know immediately. The infusion will be slowed or stopped if this occurs.    Less common, but important side effects can include:   Allogenic Stem Cell Transplant Reactions: Patients who receive this medication before or after having an allogenic stem cell transplant can be at an increased risk of graft vs. host disease, veno-occlusive disease and fever syndrome. You providers will monitor you closely for these side effects.    Reproductive Concerns  Exposure of an unborn child to this medication could cause birth defects, so you should not become pregnant or father a child while on this medication. Even if your menstrual cycle stops or you believe you are not producing sperm, effective birth control is necessary during treatment and for 5 months after stopping treatment. You should not breastfeed while taking this medication or for five months after the end of treatment.    Important Information    This drug may be present in the saliva, tears, sweat, urine, stool, vomit, semen, and vaginal secretions. Talk to your doctor and/or your nurse about the necessary precautions to take during this time.   Treating Side Effects    To decrease infection, wash your hands regularly    Avoid close contact with  people who have a cold, the flu, or other infections.    Take your temperature as your doctor or nurse tells you, and whenever you feel like you may have a fever    To help decrease bleeding, use a soft toothbrush. Check with your nurse before using dental floss.    Be very careful when using knives or tools    Use an electric shaver instead of a razor    Ask your doctor or nurse about medicines that are available to help stop or lessen constipation, diarrhea and/or nausea.    Drink plenty of fluids (a minimum of eight glasses per day is recommended).    If you are not able to move your bowels, check with your doctor or nurse before you use any enemas, laxatives, or suppositories    To help with nausea and vomiting, eat small, frequent meals instead of three large meals a day. Choose foods and drinks that are at room temperature. Ask your nurse or doctor about other helpful tips and medicine that is available to help or stop lessen these symptoms.    If you get diarrhea, eat low-fiber foods that are high in protein and  calories and avoid foods that can irritate your digestive tracts or lead to cramping. Ask your nurse or doctor about medicine that can lessen or stop your diarrhea.    To help with decreased appetite, eat small, frequent meals    Eat high caloric food such as pudding, ice cream, yogurt and milkshakes.    Manage tiredness by pacing your activities for the day. Be sure to include periods of rest between energy-draining activities    Keeping your pain under control is important to your wellbeing. Please tell your doctor or nurse if you are experiencing pain.    If you have diabetes, keep good control of your blood sugar level. Tell your nurse or your doctor if your glucose levels are higher or lower than normal    If you get a rash do not put anything on it unless your doctor or nurse says you may. Keep the area around the rash clean and dry. Ask your doctor for medicine if  your rash bothers you.    Infusion reactions may occur after your infusion. If this happens, call 911 for emergency care.   Food and Drug Interactions    There are no known interactions of nivolumab with food or other medications.    Tell your doctor and pharmacist about all the medicines and dietary supplements (vitamins, minerals, herbs and others) that you are taking at this time. The safety and use of dietary supplements and alternative diets are often not known. Using these might affect your cancer or interfere with your treatment. Until more is known, you should not use dietary supplements or alternative diets without your cancer doctor's help.   When to Call the Doctor   Call your doctor or nurse if you have any of these symptoms and/or any new or unusual symptoms:    Fever of 100.4 F (38 C) or higher    Chills    Easy bleeding or bruising    Wheezing or trouble breathing    Dry cough, or cough with yellow, green or bloody mucus    Confusion and/or agitation    Hallucinations    Trouble understanding or speaking    Blurry vision or changes in your eyesight    Numbness or lack of strength to your arms, legs, face, or body    Feeling dizzy or lightheaded    Loose bowel movements (diarrhea) more than 4 times a day or diarrhea with weakness or lightheadedness    Nausea that stops you from eating or drinking, and/or that is not relieved by prescribed medicines    Lasting loss of appetite or rapid weight loss of five pounds in a week    No bowel movement for 3 days or you feel uncomfortable    Bad abdominal pain, especially in upper right area    Fatigue or extreme weakness that interferes with normal activities    Decreased urine    Unusual thirst or passing urine often    Rash that is not relieved by prescribed medicines    Rash or itching    Flu-like symptoms: fever, headache, muscle and joint aches, and fatigue (low energy, feeling weak)    Signs of  liver problems: dark urine, pale bowel movements, bad stomach pain, feeling very tired and weak, unusual itching, or yellowing of the eyes or skin.    Signs of infusion reaction: fever or shaking chills, flushing, facial swelling, feeling dizzy, headache, trouble breathing, rash, itching, chest tightness, or chest pain.    If  you think you may be pregnant   Reproduction Warnings    Pregnancy warning: This drug can have harmful effects on the unborn baby. Women of child bearing potential should use effective methods of birth control during your cancer treatment and for at least 5 months after treatment. Let your doctor know right away if you think you may be pregnant.    Breastfeeding warning: It is not known if this drug passes into breast milk. For this reason, women should not breast feed during treatment because this drug could enter the breast milk and cause harm to a breast feeding baby.    Fertility warning: Human fertility studies have not been done with this drug. Talk with your doctor or nurse if you plan to have children. Ask for information on sperm or egg banking.     Jacob Reynolds        Generic name : Ipilimumab    How Yervoy  Is Given:  Jacob Reynolds is given as an intravenous injection through a vein (IV). It helps your immune system work properly to target and kill cancer cells.  It will take 30 minutes to infuse.    Immune Reactions  This medication stimulates your immune system. Your immune system can attack normal organs and tissues in your body, leading to serious or life threatening complications. It is important to notify your healthcare provider right away if you develop any of the following symptoms:    Diarrhea / Intestinal problems (colitis, inflammation of the bowel): Abdominal pain, diarrhea, cramping, mucus or blood in the stool, dark or tar-like stools, fever. Diarrhea means different things to different people. Any increase in your normal bowel patterns can be defined as  diarrhea and should be reported to your healthcare team.   Bowel obstruction or perforation: Abdominal pain, fever, constipation, bloating and cramping. Any decrease in your normal bowel patterns should be reported.    Skin reactions: Report rash, with or without itching (pruritis), sores in your mouth, blistering or peeling skin, as these can become severe and require treatment with corticosteroids.   Liver problems (hepatitis, inflammation of the liver): Yellowing of the skin or eyes, your urine appears dark or brown, pain in your abdomen, bleeding or bruising more easily than normal, or severe nausea and vomiting.   Brain and/or nerve problems: Report any headache, drooping of eyelids, double vision, trouble swallowing, weakness of arms, legs or face, or numbness or tingling in the hands or feet to your healthcare team.   Eye problems: Report any changes in vision, blurry or double vision, and eye pain or redness.    Lung problems (pneumonitis, inflammation of the lung): New or worsening cough, shortness of breath, trouble breathing, or chest pain.   Kidney problems: (kidney inflammation or failure):  Decreased urine output, blood in the urine, swelling in the ankles, loss of appetite.    Hormone abnormalities: Immune reactions can affect the pituitary, thyroid, pancreas and adrenal glands, resulting in inflammation of these glands, which can affect their production of certain hormones. Some hormone levels can be monitored with blood work. It is important that you report any changes in how you are feeling to your care team. Symptoms of these hormonal changes can include: headaches, nausea, vomiting, constipation, rapid heart rate, increased sweating, extreme fatigue, weakness, changes in your voice, changes in memory and concentration, increased hunger or thirst, increased urination, weight gain, hair loss, dizziness, feeling cold all the time, and changes in mood or behavior (including  irritability, forgetfulness and decreased  sex drive).    Infusion-Related Side Effects: The infusion can cause a reaction that may lead to chills, fever, low blood pressure, dizziness, feeling like you are going to pass out and difficulty breathing. Let your nurse know right away if you are experiencing any changes in how you are feeling.    Fatigue  Fatigue is very common during cancer treatment and is an overwhelming feeling of exhaustion that is not usually relieved by rest. While on cancer treatment, and for a period after, you may need to adjust your schedule to manage fatigue. Plan times to rest during the day and conserve energy for more important activities. Exercise can help combat fatigue; a simple daily walk with a friend can help. Talk to your healthcare team for helpful tips on dealing with this side effect.    Reproductive Concerns  Exposure of an unborn child to this medication could cause birth defects, so you should not become pregnant or father a child while on this medication. Effective birth control is necessary during treatment and for 3 months after the last dose. Even if your menstrual cycle stops or you believe you are not producing sperm, you could still be fertile and conceive. You should not breastfeed while receiving this medication and for 3 months after the last dose.      When to Call the Doctor   Call your doctor or nurse if you have any of these symptoms and/or any new or unusual symptoms:    Fever of 100.4 F (38 C) or higher    Chills    Easy bleeding or bruising    Wheezing or trouble breathing    Dry cough, or cough with yellow, green or bloody mucus    Confusion and/or agitation    Hallucinations    Trouble understanding or speaking    Blurry vision or changes in your eyesight    Numbness or lack of strength to your arms, legs, face, or body    Feeling dizzy or lightheaded    Loose bowel movements (diarrhea) more than 4 times a day or diarrhea  with weakness or lightheadedness    Nausea that stops you from eating or drinking, and/or that is not relieved by prescribed medicines    Lasting loss of appetite or rapid weight loss of five pounds in a week    No bowel movement for 3 days or you feel uncomfortable    Bad abdominal pain, especially in upper right area    Fatigue or extreme weakness that interferes with normal activities    Decreased urine    Unusual thirst or passing urine often    Rash that is not relieved by prescribed medicines    Rash or itching    Flu-like symptoms: fever, headache, muscle and joint aches, and fatigue (low energy, feeling weak)    Signs of liver problems: dark urine, pale bowel movements, bad stomach pain, feeling very tired and weak, unusual itching, or yellowing of the eyes or skin.    Signs of infusion reaction: fever or shaking chills, flushing, facial swelling, feeling dizzy, headache, trouble breathing, rash, itching, chest tightness, or chest pain.   Precautions:  Before starting Yervoy treatment, make sure you tell your doctor about any other medications you are taking (including prescription, over-the-counter, vitamins, herbal remedies, etc.).  Do not receive any kind of immunization or vaccination without your doctor's approval while taking Yervoy. Inform your health care professional if you are pregnant or may be pregnant prior to starting  this treatment. Pregnancy category C Jacob Reynolds may be hazardous to the fetus. Women who are pregnant or become pregnant must be advised of the potential hazard to the fetus.) For both men and women: Do not conceive a child (get pregnant) while taking Yervoy. Barrier methods of contraception, such as condoms, are recommended. Discuss with your doctor when you may safely become pregnant or conceive a child after therapy. Do not breast feed while taking this medication.      SELF CARE ACTIVITIES WHILE ON IMMUNOTHERAPY:   Hydration Increase your fluid  intake drink at least 8 to 12 cups (64 ounces) of water/decaffeinated beverages per day after treatment. You can still have your cup of coffee or soda but these beverages do not count as part of your 8 to 12 cups that you need to drink daily. No alcohol intake.   Medications Continue taking your normal prescription medication as prescribed.  If you start any new herbal or new supplements please let us know first to make sure it is safe.   Infection Prevention Please wash your hands for at least 30 seconds using warm soapy water. Handwashing is the #1 way to prevent the spread of germs. Stay away from sick people or people who are getting over a cold. If you develop respiratory systems such as green/yellow mucus production or productive cough or persistent cough let us know and we will see if you need an antibiotic. It is a good idea to keep a pair of gloves on when going into grocery stores/[employer] to decrease your risk of coming into contact with germs on the carts, etc. Carry alcohol hand gel with you at all times and use it frequently if out in public. If your temperature reaches 100.4 or higher please call the clinic and let us know.  If it is after hours or on the weekend please go to the ER if your temperature is over 100.4.  Please have your own personal thermometer at home to use.     Sex and bodily fluids If you are going to have sex, a condom must be used to protect the person that isn't taking immunotherapy. For a few days after treatment, immunotherapy can be excreted through your bodily fluids.  When using the toilet please close the lid and flush the toilet twice.  Do this for a few day after you have had immunotherapy.    Contraception It is not known for sure whether or not immunotherapy drugs can be passed on through semen or secretions from the vagina. Because of this some doctors advise people to use a barrier method if you have sex during treatment. This applies to vaginal, anal or  oral sex.   Generally, doctors advise a barrier method only for the time you are actually having the treatment and for about a week after your treatment.   Advice like this can be worrying, but this does not mean that you have to avoid being intimate with your partner. You can still have close contact with your partner and continue to enjoy sex.   Animals If you have cats or birds we just ask that you not change the litter or change the cage.  Please have someone else do this for you while you are on immunotherapy.    Food Safety During and After Cancer Treatment Food safety is important for people both during and after cancer treatment. Cancer and cancer treatments, such as chemotherapy, radiation therapy, and stem cell/bone marrow transplantation, often weaken the  immune system. This makes it harder for your body to protect itself from foodborne illness, also called food poisoning. Foodborne illness is caused by eating food that contains harmful bacteria, parasites, or viruses.   Foods to avoid Some foods have a higher risk of becoming tainted with bacteria. These include: Unwashed fresh fruit and vegetables, especially leafy vegetables that can hide dirt and other contaminants Raw sprouts, such as alfalfa sprouts Raw or undercooked beef, especially ground beef, or other raw or undercooked meat and poultry Fatty, fried, or spicy foods immediately before or after treatment.  These can sit heavy on your stomach and make you feel nauseous. Raw or undercooked shellfish, such as oysters. Sushi and sashimi, which often contain raw fish.  Unpasteurized beverages, such as unpasteurized fruit juices, raw milk, raw yogurt, or cider Undercooked eggs, such as soft boiled, over easy, and poached; raw, unpasteurized eggs; or foods made with raw egg, such as homemade raw cookie dough and homemade mayonnaise   Simple steps for food safety   Shop smart. Do not buy food stored or displayed in an unclean  area. Do not buy bruised or damaged fruits or vegetables. Do not buy cans that have cracks, dents, or bulges. Pick up foods that can spoil at the end of your shopping trip and store them in a cooler on the way home.   Prepare and clean up foods carefully. Rinse all fresh fruits and vegetables under running water, and dry them with a clean towel or paper towel. Clean the top of cans before opening them. After preparing food, wash your hands for 20 seconds with hot water and soap. Pay special attention to areas between fingers and under nails. Clean your utensils and dishes with hot water and soap. Disinfect your kitchen and cutting boards using 1 teaspoon of liquid, unscented bleach mixed into 1 quart of water.     Dispose of old food. Eat canned and packaged food before its expiration date (the "use by" or "best before" date). Consume refrigerated leftovers within 3 to 4 days. After that time, throw out the food. Even if the food does not smell or look spoiled, it still may be unsafe. Some bacteria, such as Listeria, can grow even on foods stored in the refrigerator if they are kept for too long.   Take precautions when eating out. At restaurants, avoid buffets and salad bars where food sits out for a long time and comes in contact with many people. Food can become contaminated when someone with a virus, often a norovirus, or another "bug" handles it. Put any leftover food in a "to-go" container yourself, rather than having the server do it. And, refrigerate leftovers as soon as you get home. Choose restaurants that are clean and that are willing to prepare your food as you order it cooked.       Wear comfortable clothing and clothing appropriate for easy access to any Portacath or PICC line. Let us know if there is anything that we can do to make your therapy better!     What to do if you need assistance after hours or on the weekends: CALL [phone].  HOLD on the line, do not hang up.   You will hear multiple messages but at the end you will be connected with a nurse triage line.  They will contact the doctor if necessary.  Most of the time they will be able to assist you.  Do not call the hospital operator.  I have been informed and understand all of the instructions given to me and have received a copy. I have been instructed to call the clinic [phone] or my family physician as soon as possible for continued medical care, if indicated. I do not have any more questions at this time but understand that I may call the [facility] or the Patient Navigator at [phone] during office hours should I have questions or need assistance in obtaining follow-up care.

## 2021-11-11 ENCOUNTER — Inpatient Hospital Stay (HOSPITAL_COMMUNITY): Payer: Commercial Managed Care - PPO | Attending: Physician Assistant

## 2021-11-11 DIAGNOSIS — C642 Malignant neoplasm of left kidney, except renal pelvis: Secondary | ICD-10-CM | POA: Insufficient documentation

## 2021-11-11 DIAGNOSIS — Z5112 Encounter for antineoplastic immunotherapy: Secondary | ICD-10-CM | POA: Insufficient documentation

## 2021-11-11 DIAGNOSIS — C649 Malignant neoplasm of unspecified kidney, except renal pelvis: Secondary | ICD-10-CM

## 2021-11-11 DIAGNOSIS — Z79899 Other long term (current) drug therapy: Secondary | ICD-10-CM | POA: Insufficient documentation

## 2021-11-11 DIAGNOSIS — C7951 Secondary malignant neoplasm of bone: Secondary | ICD-10-CM | POA: Insufficient documentation

## 2021-11-11 DIAGNOSIS — Z95828 Presence of other vascular implants and grafts: Secondary | ICD-10-CM

## 2021-11-11 MED ORDER — LIDOCAINE-PRILOCAINE 2.5-2.5 % EX CREA: TOPICAL_CREAM | CUTANEOUS | 3 refills | Status: DC

## 2021-11-11 NOTE — Progress Notes (Signed)
Immunotherapy education packet given and discussed with pt in detail.  Discussed diagnosis, staging, tx regimen, and intent of tx.  Reviewed immunotherapy medications and side effects.  Instructed on how to manage side effects at home, and when to call the clinic.  Importance of fever/chills discussed with pt and family. Discussed precautions to implement at home after receiving tx, as well as self care strategies. Phone numbers provided for clinic during regular working hours, also how to reach the clinic after hours and on weekends. Pt provided the opportunity to ask questions - all questions answered to pt's satisfaction.     

## 2021-11-15 ENCOUNTER — Inpatient Hospital Stay (HOSPITAL_COMMUNITY): Payer: Commercial Managed Care - PPO

## 2021-11-15 MED ORDER — LIDOCAINE-PRILOCAINE 2.5-2.5 % EX CREA: TOPICAL_CREAM | CUTANEOUS | 3 refills | Status: DC

## 2021-11-15 NOTE — Addendum Note (Signed)
Addended by: Joie Bimler on: 11/15/2021 02:54 PM   Modules accepted: Orders

## 2021-11-16 NOTE — Progress Notes (Signed)
Pharmacist Chemotherapy Monitoring - Initial Assessment    Anticipated start date: 11/17/2021   The following has been reviewed per standard work regarding the patient's treatment regimen: The patient's diagnosis, treatment plan and drug doses, and organ/hematologic function Lab orders and baseline tests specific to treatment regimen  The treatment plan start date, drug sequencing, and pre-medications Prior authorization status  Patient's documented medication list, including drug-drug interaction screen and prescriptions for anti-emetics and supportive care specific to the treatment regimen The drug concentrations, fluid compatibility, administration routes, and timing of the medications to be used The patient's access for treatment and lifetime cumulative dose history, if applicable  The patient's medication allergies and previous infusion related reactions, if applicable   Changes made to treatment plan:  treatment plan date  Follow up needed:  N/A   Wynona Neat, [facility], 11/16/2021  8:45 AM

## 2021-11-17 ENCOUNTER — Inpatient Hospital Stay (HOSPITAL_COMMUNITY): Payer: Commercial Managed Care - PPO

## 2021-11-17 VITALS — BP 121/78 | HR 82 | Temp 97.4°F | Resp 18 | Ht 68.94 in | Wt 164.4 lb

## 2021-11-17 DIAGNOSIS — Z79899 Other long term (current) drug therapy: Secondary | ICD-10-CM | POA: Diagnosis not present

## 2021-11-17 DIAGNOSIS — Z5112 Encounter for antineoplastic immunotherapy: Secondary | ICD-10-CM | POA: Diagnosis present

## 2021-11-17 DIAGNOSIS — C642 Malignant neoplasm of left kidney, except renal pelvis: Secondary | ICD-10-CM | POA: Diagnosis present

## 2021-11-17 DIAGNOSIS — Z95828 Presence of other vascular implants and grafts: Secondary | ICD-10-CM

## 2021-11-17 DIAGNOSIS — C649 Malignant neoplasm of unspecified kidney, except renal pelvis: Secondary | ICD-10-CM

## 2021-11-17 DIAGNOSIS — C7951 Secondary malignant neoplasm of bone: Secondary | ICD-10-CM | POA: Diagnosis not present

## 2021-11-17 MED ORDER — SODIUM CHLORIDE 0.9 % IV SOLN
1.0000 mg/kg | Freq: Once | INTRAVENOUS | Status: AC
  Administered 2021-11-17: 75 mg via INTRAVENOUS
  Filled 2021-11-17: qty 15

## 2021-11-17 MED ORDER — HEPARIN SOD (PORK) LOCK FLUSH 100 UNIT/ML IV SOLN
500.0000 [IU] | Freq: Once | INTRAVENOUS | Status: AC | PRN
  Administered 2021-11-17: 500 [IU]

## 2021-11-17 MED ORDER — SODIUM CHLORIDE 0.9 % IV SOLN
200.0000 mg | Freq: Once | INTRAVENOUS | Status: AC
  Administered 2021-11-17: 200 mg via INTRAVENOUS
  Filled 2021-11-17: qty 20

## 2021-11-17 MED ORDER — SODIUM CHLORIDE 0.9% FLUSH
10.0000 mL | INTRAVENOUS | Status: DC | PRN
  Administered 2021-11-17: 10 mL

## 2021-11-17 MED ORDER — FAMOTIDINE IN NACL 20-0.9 MG/50ML-% IV SOLN
20.0000 mg | Freq: Once | INTRAVENOUS | Status: AC
  Administered 2021-11-17: 20 mg via INTRAVENOUS
  Filled 2021-11-17: qty 50

## 2021-11-17 MED ORDER — DIPHENHYDRAMINE HCL 50 MG/ML IJ SOLN
25.0000 mg | Freq: Once | INTRAMUSCULAR | Status: AC
  Administered 2021-11-17: 25 mg via INTRAVENOUS
  Filled 2021-11-17: qty 1

## 2021-11-17 MED ORDER — SODIUM CHLORIDE 0.9 % IV SOLN: Freq: Once | INTRAVENOUS | Status: AC

## 2021-11-17 NOTE — Progress Notes (Signed)
Ipilimumab (YERVOY) Patient Monitoring Assessment   Is the patient experiencing any of the following general symptoms?:  '[]'$ Difficulty performing normal activities '[]'$ Feeling sluggish or cold all the time '[]'$ Unusual weight gain '[]'$ Constant or unusual headaches '[]'$ Feeling dizzy or faint '[]'$ Changes in eyesight (blurry vision, double vision, or other vision problems) '[]'$ Changes in mood or behavior (ex: decreased sex drive, irritability, or forgetfulness) '[]'$ Starting new medications (ex: steroids, other medications that lower immune response) '[x]'$ Patient is not experiencing any of the general symptoms above.    Gastrointestinal  Patient is having 1 bowel movements each day.  Is this different from baseline? '[]'$ Yes '[x]'$ No Are your stools watery or do they have a foul smell? '[]'$ Yes '[x]'$ No Have you seen blood in your stools? '[]'$ Yes '[x]'$ No Are your stools dark, tarry, or sticky? '[]'$ Yes '[x]'$ No Are you having pain or tenderness in your belly? '[]'$ Yes '[x]'$ No  Skin Does your skin itch? '[]'$ Yes '[x]'$ No Do you have a rash? '[]'$ Yes '[x]'$ No Has your skin blistered and/or peeled? '[]'$ Yes '[x]'$ No Do you have sores in your mouth? '[]'$ Yes '[x]'$ No  Hepatic Has your urine been dark or tea colored? '[]'$ Yes '[x]'$ No Have you noticed that your skin or the whites of your eyes are turning yellow? '[]'$ Yes '[x]'$ No Are you bleeding or bruising more easily than normal? '[]'$ Yes '[x]'$ No Are you nauseous and/or vomiting? '[]'$ Yes '[x]'$ No Do you have pain on the right side of your stomach? '[]'$ Yes '[x]'$ No  Neurologic  Are you having unusual weakness of legs, arms, or face? '[]'$ Yes '[x]'$ No Are you having numbness or tingling in your hands or feet? '[]'$ Yes '[x]'$ No  Marcelino Scot, [facility]

## 2021-11-17 NOTE — Patient Instructions (Signed)
[facility]  Discharge Instructions: Thank you for choosing [facility] to provide your oncology and hematology care.  If you have a lab appointment with the [facility], please come in thru the Main Entrance and check in at the main information desk.  Wear comfortable clothing and clothing appropriate for easy access to any Portacath or PICC line.   We strive to give you quality time with your provider. You may need to reschedule your appointment if you arrive late (15 or more minutes).  Arriving late affects you and other patients whose appointments are after yours.  Also, if you miss three or more appointments without notifying the office, you may be dismissed from the clinic at the provider's discretion.      For prescription refill requests, have your pharmacy contact our office and allow 72 hours for refills to be completed.    Today you received the following chemotherapy and/or immunotherapy agents opdivo, yervoy      To help prevent nausea and vomiting after your treatment, we encourage you to take your nausea medication as directed.  BELOW ARE SYMPTOMS THAT SHOULD BE REPORTED IMMEDIATELY: *FEVER GREATER THAN 100.4 F (38 C) OR HIGHER *CHILLS OR SWEATING *NAUSEA AND VOMITING THAT IS NOT CONTROLLED WITH YOUR NAUSEA MEDICATION *UNUSUAL SHORTNESS OF BREATH *UNUSUAL BRUISING OR BLEEDING *URINARY PROBLEMS (pain or burning when urinating, or frequent urination) *BOWEL PROBLEMS (unusual diarrhea, constipation, pain near the anus) TENDERNESS IN MOUTH AND THROAT WITH OR WITHOUT PRESENCE OF ULCERS (sore throat, sores in mouth, or a toothache) UNUSUAL RASH, SWELLING OR PAIN  UNUSUAL VAGINAL DISCHARGE OR ITCHING   Items with * indicate a potential emergency and should be followed up as soon as possible or go to the Emergency Department if any problems should occur.  Please show the CHEMOTHERAPY ALERT CARD or IMMUNOTHERAPY ALERT CARD at check-in to the Emergency  Department and triage nurse.  Should you have questions after your visit or need to cancel or reschedule your appointment, please contact [facility] [phone]  and follow the prompts.  Office hours are 8:00 a.m. to 4:30 p.m. Monday - Friday. Please note that voicemails left after 4:00 p.m. may not be returned until the following business day.  We are closed weekends and major holidays. You have access to a nurse at all times for urgent questions. Please call the main number to the clinic [phone] and follow the prompts.  For any non-urgent questions, you may also contact your provider using MyChart. We now offer e-Visits for anyone 5 and older to request care online for non-urgent symptoms. For details visit [email or web address].   Also download the MyChart app! Go to the app store, search "MyChart", open the app, select [facility], and log in with your MyChart username and password.  Due to Covid, a mask is required upon entering the hospital/clinic. If you do not have a mask, one will be given to you upon arrival. For doctor visits, patients may have 1 support person aged 48 or older with them. For treatment visits, patients cannot have anyone with them due to current Covid guidelines and our immunocompromised population. Ipilimumab injection What is this medication? IPILIMUMAB (IP i LIM ue mab) is a monoclonal antibody. It treats colorectal cancer, esophageal cancer, kidney cancer, liver cancer, lung cancer, melanoma, and mesothelioma. This medicine may be used for other purposes; ask your health care provider or pharmacist if you have questions. COMMON BRAND NAME(S): YERVOY What should I tell my  care team before I take this medication? They need to know if you have any of these conditions: autoimmune diseases like Crohn's disease, ulcerative colitis, or lupus have had or planning to have an allogeneic stem cell transplant (uses someone else's stem cells) history of organ  transplant nervous system problems like myasthenia gravis or Guillain-Barre syndrome an unusual or allergic reaction to ipilimumab, other medicines, foods, dyes, or preservatives pregnant or trying to get pregnant breast-feeding How should I use this medication? This medicine is for infusion into a vein. It is given by a health care professional in a hospital or clinic setting. A special MedGuide will be given to you before each treatment. Be sure to read this information carefully each time. Talk to your pediatrician regarding the use of this medicine in children. While this drug may be prescribed for children as young as 12 years for selected conditions, precautions do apply. Overdosage: If you think you have taken too much of this medicine contact a poison control center or emergency room at once. NOTE: This medicine is only for you. Do not share this medicine with others. What if I miss a dose? It is important not to miss your dose. Call your doctor or health care professional if you are unable to keep an appointment. What may interact with this medication? Interactions are not expected. This list may not describe all possible interactions. Give your health care provider a list of all the medicines, herbs, non-prescription drugs, or dietary supplements you use. Also tell them if you smoke, drink alcohol, or use illegal drugs. Some items may interact with your medicine. What should I watch for while using this medication? Tell your doctor or healthcare professional if your symptoms do not start to get better or if they get worse. Do not become pregnant while taking this medicine or for 3 months after stopping it. Women should inform their doctor if they wish to become pregnant or think they might be pregnant. There is a potential for serious side effects to an unborn child. Talk to your health care professional or pharmacist for more information. Do not breast-feed an infant while taking this  medicine or for 3 months after the last dose. Your condition will be monitored carefully while you are receiving this medicine. You may need blood work done while you are taking this medicine. What side effects may I notice from receiving this medication? Side effects that you should report to your doctor or health care professional as soon as possible: allergic reactions like skin rash, itching or hives, swelling of the face, lips, or tongue black, tarry stools bloody or watery diarrhea changes in vision dizziness eye pain fast, irregular heartbeat feeling anxious feeling faint or lightheaded, falls nausea, vomiting pain, tingling, numbness in the hands or feet redness, blistering, peeling or loosening of the skin, including inside the mouth signs and symptoms of liver injury like dark yellow or brown urine; general ill feeling or flu-like symptoms; light-colored stools; loss of appetite; nausea; right upper belly pain; unusually weak or tired; yellowing of the eyes or skin unusual bleeding or bruising Side effects that usually do not require medical attention (report to your doctor or health care professional if they continue or are bothersome): headache loss of appetite trouble sleeping This list may not describe all possible side effects. Call your doctor for medical advice about side effects. You may report side effects to FDA at 1-800-FDA-1088. Where should I keep my medication? This drug is given in a  hospital or clinic and will not be stored at home. NOTE: This sheet is a summary. It may not cover all possible information. If you have questions about this medicine, talk to your doctor, pharmacist, or health care provider.  2023 Elsevier/Gold Standard (2021-04-30 00:00:00) Nivolumab injection What is this medication? NIVOLUMAB (nye VOL ue mab) is a monoclonal antibody. It treats certain types of cancer. Some of the cancers treated are colon cancer, head and neck cancer, Hodgkin  lymphoma, lung cancer, and melanoma. This medicine may be used for other purposes; ask your health care provider or pharmacist if you have questions. COMMON BRAND NAME(S): Opdivo What should I tell my care team before I take this medication? They need to know if you have any of these conditions: Autoimmune diseases such as Crohn's disease, ulcerative colitis, or lupus Have had or planning to have an allogeneic stem cell transplant (uses someone else's stem cells) History of chest radiation Organ transplant Nervous system problems such as myasthenia gravis or Guillain-Barre syndrome An unusual or allergic reaction to nivolumab, other medicines, foods, dyes, or preservatives Pregnant or trying to get pregnant Breast-feeding How should I use this medication? This medication is injected into a vein. It is given in a hospital or clinic setting. A special MedGuide will be given to you before each treatment. Be sure to read this information carefully each time. Talk to your care team regarding the use of this medication in children. While it may be prescribed for children as young as 12 years for selected conditions, precautions do apply. Overdosage: If you think you have taken too much of this medicine contact a poison control center or emergency room at once. NOTE: This medicine is only for you. Do not share this medicine with others. What if I miss a dose? Keep appointments for follow-up doses. It is important not to miss your dose. Call your care team if you are unable to keep an appointment. What may interact with this medication? Interactions have not been studied. This list may not describe all possible interactions. Give your health care provider a list of all the medicines, herbs, non-prescription drugs, or dietary supplements you use. Also tell them if you smoke, drink alcohol, or use illegal drugs. Some items may interact with your medicine. What should I watch for while using this  medication? Your condition will be monitored carefully while you are receiving this medication. You may need blood work done while you are taking this medication. Do not become pregnant while taking this medication or for 5 months after stopping it. Women should inform their care team if they wish to become pregnant or think they might be pregnant. There is a potential for serious harm to an unborn child. Talk to your care team for more information. Do not breast-feed an infant while taking this medication or for 5 months after stopping it. What side effects may I notice from receiving this medication? Side effects that you should report to your care team as soon as possible: Allergic reactions--skin rash, itching, hives, swelling of the face, lips, tongue, or throat Bloody or black, tar-like stools Change in vision Chest pain Diarrhea Dry cough, shortness of breath or trouble breathing Eye pain Fast or irregular heartbeat Fever, chills High blood sugar (hyperglycemia)--increased thirst or amount of urine, unusual weakness or fatigue, blurry vision High thyroid levels (hyperthyroidism)--fast or irregular heartbeat, weight loss, excessive sweating or sensitivity to heat, tremors or shaking, anxiety, nervousness, irregular menstrual cycle or spotting Kidney injury--decrease in the  amount of urine, swelling of the ankles, hands, or feet Liver injury--right upper belly pain, loss of appetite, nausea, light-colored stool, dark yellow or brown urine, yellowing skin or eyes, unusual weakness or fatigue Low red blood cell count--unusual weakness or fatigue, dizziness, headache, trouble breathing Low thyroid levels (hypothyroidism)--unusual weakness or fatigue, increased sensitivity to cold, constipation, hair loss, dry skin, weight gain, feelings of depression Mood and behavior changes-confusion, change in sex drive or performance, irritability Muscle pain or cramps Pain, tingling, or numbness in the  hands or feet, muscle weakness, trouble walking, loss of balance or coordination Red or dark brown urine Redness, blistering, peeling, or loosening of the skin, including inside the mouth Stomach pain Unusual bruising or bleeding Side effects that usually do not require medical attention (report to your care team if they continue or are bothersome): Bone pain Constipation Loss of appetite Nausea Tiredness Vomiting This list may not describe all possible side effects. Call your doctor for medical advice about side effects. You may report side effects to FDA at 1-800-FDA-1088. Where should I keep my medication? This medication is given in a hospital or clinic and will not be stored at home. NOTE: This sheet is a summary. It may not cover all possible information. If you have questions about this medicine, talk to your doctor, pharmacist, or health care provider.  2023 Elsevier/Gold Standard (2021-04-30 00:00:00)

## 2021-11-17 NOTE — Progress Notes (Signed)
Patient presents today for d1c1 Opdivo/Yervoy infusion per providers order.  Labs from 11/04/21 being used today per Dr. Delton Coombes and are within parameters for treatment.  Vital signs within parameters for treatment.  Patient has no new complaints at this time.   Stable during infusion without adverse affects.  Vital signs stable.  No complaints at this time.  Discharge from clinic ambulatory in stable condition.  Alert and oriented X 3.  Follow up with [facility] as scheduled.

## 2021-11-18 ENCOUNTER — Encounter (HOSPITAL_COMMUNITY): Payer: Self-pay

## 2021-12-06 ENCOUNTER — Inpatient Hospital Stay (HOSPITAL_COMMUNITY): Payer: Commercial Managed Care - PPO

## 2021-12-06 ENCOUNTER — Inpatient Hospital Stay (HOSPITAL_COMMUNITY): Payer: Commercial Managed Care - PPO | Admitting: Hematology

## 2021-12-08 ENCOUNTER — Inpatient Hospital Stay (HOSPITAL_COMMUNITY): Payer: Commercial Managed Care - PPO

## 2021-12-08 ENCOUNTER — Inpatient Hospital Stay (HOSPITAL_COMMUNITY): Payer: Commercial Managed Care - PPO | Admitting: Hematology

## 2021-12-08 VITALS — BP 122/82 | HR 53 | Temp 97.7°F | Resp 18

## 2021-12-08 DIAGNOSIS — C649 Malignant neoplasm of unspecified kidney, except renal pelvis: Secondary | ICD-10-CM

## 2021-12-08 DIAGNOSIS — Z5112 Encounter for antineoplastic immunotherapy: Secondary | ICD-10-CM | POA: Diagnosis not present

## 2021-12-08 DIAGNOSIS — M899 Disorder of bone, unspecified: Secondary | ICD-10-CM

## 2021-12-08 DIAGNOSIS — Z95828 Presence of other vascular implants and grafts: Secondary | ICD-10-CM

## 2021-12-08 DIAGNOSIS — N2889 Other specified disorders of kidney and ureter: Secondary | ICD-10-CM

## 2021-12-08 DIAGNOSIS — C7951 Secondary malignant neoplasm of bone: Secondary | ICD-10-CM | POA: Diagnosis not present

## 2021-12-08 LAB — COMPREHENSIVE METABOLIC PANEL
ALT: 21 U/L (ref 0–44)
AST: 20 U/L (ref 15–41)
Albumin: 3.7 g/dL (ref 3.5–5.0)
Alkaline Phosphatase: 84 U/L (ref 38–126)
Anion gap: 11 (ref 5–15)
BUN: 23 mg/dL — ABNORMAL HIGH (ref 6–20)
CO2: 26 mmol/L (ref 22–32)
Calcium: 8.8 mg/dL — ABNORMAL LOW (ref 8.9–10.3)
Chloride: 103 mmol/L (ref 98–111)
Creatinine, Ser: 1.02 mg/dL (ref 0.61–1.24)
GFR, Estimated: >60 mL/min (ref >60)
Glucose, Bld: 160 mg/dL — ABNORMAL HIGH (ref 70–99)
Potassium: 4 mmol/L (ref 3.5–5.1)
Sodium: 140 mmol/L (ref 135–145)
Total Bilirubin: 0.6 mg/dL (ref 0.3–1.2)
Total Protein: 7.1 g/dL (ref 6.5–8.1)

## 2021-12-08 LAB — CBC WITH DIFFERENTIAL/PLATELET
Abs Immature Granulocytes: 0.02 10*3/uL (ref 0.00–0.07)
Basophils Absolute: 0.1 10*3/uL (ref 0.0–0.1)
Basophils Relative: 1 %
Eosinophils Absolute: 0.3 10*3/uL (ref 0.0–0.5)
Eosinophils Relative: 3 %
HCT: 44.5 % (ref 39.0–52.0)
Hemoglobin: 14.7 g/dL (ref 13.0–17.0)
Immature Granulocytes: 0 %
Lymphocytes Relative: 23 %
Lymphs Abs: 2.1 10*3/uL (ref 0.7–4.0)
MCH: 29.6 pg (ref 26.0–34.0)
MCHC: 33 g/dL (ref 30.0–36.0)
MCV: 89.5 fL (ref 80.0–100.0)
Monocytes Absolute: 0.6 10*3/uL (ref 0.1–1.0)
Monocytes Relative: 6 %
Neutro Abs: 6.4 10*3/uL (ref 1.7–7.7)
Neutrophils Relative %: 67 %
Platelets: 180 10*3/uL (ref 150–400)
RBC: 4.97 MIL/uL (ref 4.22–5.81)
RDW: 14.1 % (ref 11.5–15.5)
WBC: 9.4 10*3/uL (ref 4.0–10.5)
nRBC: 0 % (ref 0.0–0.2)

## 2021-12-08 LAB — MAGNESIUM: Magnesium: 2 mg/dL (ref 1.7–2.4)

## 2021-12-08 LAB — TSH: TSH: 1.099 u[IU]/mL (ref 0.350–4.500)

## 2021-12-08 MED ORDER — SODIUM CHLORIDE 0.9 % IV SOLN
1.0000 mg/kg | Freq: Once | INTRAVENOUS | Status: AC
  Administered 2021-12-08: 75 mg via INTRAVENOUS
  Filled 2021-12-08: qty 15

## 2021-12-08 MED ORDER — FAMOTIDINE IN NACL 20-0.9 MG/50ML-% IV SOLN
20.0000 mg | Freq: Once | INTRAVENOUS | Status: AC
  Administered 2021-12-08: 20 mg via INTRAVENOUS
  Filled 2021-12-08: qty 50

## 2021-12-08 MED ORDER — DIPHENHYDRAMINE HCL 50 MG/ML IJ SOLN
25.0000 mg | Freq: Once | INTRAMUSCULAR | Status: AC
  Administered 2021-12-08: 25 mg via INTRAVENOUS
  Filled 2021-12-08: qty 1

## 2021-12-08 MED ORDER — SODIUM CHLORIDE 0.9% FLUSH
10.0000 mL | INTRAVENOUS | Status: DC | PRN
  Administered 2021-12-08: 10 mL

## 2021-12-08 MED ORDER — SODIUM CHLORIDE 0.9 % IV SOLN: Freq: Once | INTRAVENOUS | Status: AC

## 2021-12-08 MED ORDER — HEPARIN SOD (PORK) LOCK FLUSH 100 UNIT/ML IV SOLN
500.0000 [IU] | Freq: Once | INTRAVENOUS | Status: AC | PRN
  Administered 2021-12-08: 500 [IU]

## 2021-12-08 MED ORDER — SODIUM CHLORIDE 0.9 % IV SOLN
2.7500 mg/kg | Freq: Once | INTRAVENOUS | Status: AC
  Administered 2021-12-08: 200 mg via INTRAVENOUS
  Filled 2021-12-08: qty 20

## 2021-12-08 NOTE — Patient Instructions (Addendum)
[facility] at [facility] Discharge Instructions   You were seen and examined today by Dr. Delton Coombes.  He reviewed the results of your lab work which are normal/stable.   We will proceed with your treatment today.   Please see a dentist for dental exam. Dr. Raliegh Ip is interested in starting you on a shot called Xgeva. It helps strengthen your bones and decreases the chance of fractures in your bones from where the cancer has spread to them. We have to have a dental evaluation prior to starting this shot.   Return as scheduled in 3 weeks.     Thank you for choosing [place] at [facility] to provide your oncology and hematology care.  To afford each patient quality time with our provider, please arrive at least 15 minutes before your scheduled appointment time.   If you have a lab appointment with the [facility] please come in thru the Main Entrance and check in at the main information desk.  You need to re-schedule your appointment should you arrive 10 or more minutes late.  We strive to give you quality time with our providers, and arriving late affects you and other patients whose appointments are after yours.  Also, if you no show three or more times for appointments you may be dismissed from the clinic at the providers discretion.     Again, thank you for choosing [facility].  Our hope is that these requests will decrease the amount of time that you wait before being seen by our physicians.       _____________________________________________________________  Should you have questions after your visit to [facility], please contact our office at [phone] and follow the prompts.  Our office hours are 8:00 a.m. and 4:30 p.m. Monday - Friday.  Please note that voicemails left after 4:00 p.m. may not be returned until the following business day.  We are closed weekends and major holidays.  You do have access to a  nurse 24-7, just call the main number to the clinic [phone] and do not press any options, hold on the line and a nurse will answer the phone.    For prescription refill requests, have your pharmacy contact our office and allow 72 hours.    Due to Covid, you will need to wear a mask upon entering the hospital. If you do not have a mask, a mask will be given to you at the Main Entrance upon arrival. For doctor visits, patients may have 1 support person age 35 or older with them. For treatment visits, patients can not have anyone with them due to social distancing guidelines and our immunocompromised population.

## 2021-12-08 NOTE — Progress Notes (Signed)
Jacob Reynolds, [place] [id number]   CLINIC:  Medical Oncology/Hematology  PCP:  [name], [place] / [name] [address] [phone]   REASON FOR VISIT:  Follow-up for renal mass and bone lesion  PRIOR THERAPY: none   NGS Results: not done  CURRENT THERAPY: Nivolumab + Ipilimumab q21d / Nivolumab q28d  BRIEF ONCOLOGIC HISTORY:  Oncology History  Metastatic renal cell carcinoma to bone (Jacob Reynolds)  11/04/2021 Initial Diagnosis   Metastatic renal cell carcinoma to bone (Jacob Reynolds)   11/17/2021 -  Chemotherapy   Patient is on Treatment Plan : RENAL CELL CARCINOMA Nivolumab + Ipilimumab q21d / Nivolumab q28d       CANCER STAGING:  Cancer Staging  Metastatic renal cell carcinoma to bone [facility]) Staging form: Kidney, AJCC 8th Edition - Clinical stage from 11/04/2021: Stage IV (cT1b, cN1, pM1) - Unsigned   INTERVAL HISTORY:  [name], a 56 y.o. male, returns for routine follow-up and consideration for next cycle of chemotherapy. Sy was last seen on 11/04/2021.  Due for cycle #2 of Nivolumab + Ipilimumab today.   Overall, he tells me he has been feeling pretty well. He reports dry skin, and he reports 1-2 days of mild fatigue following treatment. He denies diarrhea, skin rash, and SOB. He reports cough. His back and neck pain have improved. His maternal cousin had their kidney removed although he is unaware why the kidney was removed.   Overall, he feels ready for next cycle of chemo today.    REVIEW OF SYSTEMS:  Review of Systems  Constitutional:  Negative for appetite change and fatigue.  Respiratory:  Positive for cough. Negative for shortness of breath.   Gastrointestinal:  Negative for diarrhea.  Musculoskeletal:  Positive for back pain (improved) and neck pain (improved).  All other systems reviewed and are negative.   PAST MEDICAL/SURGICAL HISTORY:  Past Medical History:  Diagnosis Date   Plantar fasciitis     Port-A-Cath in place 11/10/2021   Past Surgical History:  Procedure Laterality Date   IR IMAGING GUIDED PORT INSERTION  10/15/2021    SOCIAL HISTORY:  Social History   Socioeconomic History   Marital status: Single    Spouse name: Not on file   Number of children: 1   Years of education: 11   Highest education level: 11th grade  Occupational History   Not on file  Tobacco Use   Smoking status: Every Day    Packs/day: 1.00    Years: 36.00    Total pack years: 36.00    Types: Cigarettes    Start date: 06/13/1978   Smokeless tobacco: Never  Substance and Sexual Activity   Alcohol use: Yes    Alcohol/week: 7.0 standard drinks of alcohol    Types: 7 Cans of beer per week   Drug use: Yes    Frequency: 1.0 times per week    Types: Marijuana   Sexual activity: Not on file  Other Topics Concern   Not on file  Social History Narrative   Not on file   Social Determinants of Health   Financial Resource Strain: Not on file  Food Insecurity: Not on file  Transportation Needs: Not on file  Physical Activity: Not on file  Stress: Not on file  Social Connections: Not on file  Intimate Partner Violence: Not on file    FAMILY HISTORY:  Family History  Problem Relation Age of Onset   Hypertension Mother  Parkinson's disease Father     CURRENT MEDICATIONS:  Current Outpatient Medications  Medication Sig Dispense Refill   ciclopirox (PENLAC) 8 % solution Apply topically at bedtime. Apply over nail and surrounding skin. Apply daily over previous coat. After seven (7) days, may remove with alcohol and continue cycle. 6.6 mL 0   cyclobenzaprine (FLEXERIL) 10 MG tablet Take 1 tablet (10 mg total) by mouth 3 (three) times daily as needed for muscle spasms. 90 tablet 1   Ipilimumab (YERVOY IV) Inject into the vein every 21 ( twenty-one) days. Every 21 days x 4 cycles then d/c     lidocaine-prilocaine (EMLA) cream Apply a small amount to port a cath site and cover with plastic wrap  1 hour prior to infusion appointments 30 g 3   meloxicam (MOBIC) 15 MG tablet Take 1 tablet (15 mg total) by mouth daily as needed for pain. 30 tablet 0   Nivolumab (OPDIVO IV) Inject into the vein every 21 ( twenty-one) days. Every 21 days x 4 cycles, then every 28 days     No current facility-administered medications for this visit.    ALLERGIES:  Allergies  Allergen Reactions   Iodine     Throat felt like closed    PHYSICAL EXAM:  Performance status (ECOG): 0 - Asymptomatic  There were no vitals filed for this visit. Wt Readings from Last 3 Encounters:  12/08/21 162 lb 12.8 oz (73.8 kg)  11/17/21 164 lb 6.4 oz (74.6 kg)  11/04/21 160 lb 9.6 oz (72.8 kg)   Physical Exam Vitals reviewed.  Constitutional:      Appearance: Normal appearance.  Cardiovascular:     Rate and Rhythm: Normal rate and regular rhythm.     Pulses: Normal pulses.     Heart sounds: Normal heart sounds.  Pulmonary:     Effort: Pulmonary effort is normal.     Breath sounds: Normal breath sounds.  Neurological:     General: No focal deficit present.     Mental Status: He is alert and oriented to person, place, and time.  Psychiatric:        Mood and Affect: Mood normal.        Behavior: Behavior normal.     LABORATORY DATA:  I have reviewed the labs as listed.     Latest Ref Rng & Units 11/04/2021    1:47 PM 10/15/2021    8:27 AM 08/05/2021    8:50 AM  CBC  WBC 4.0 - 10.5 K/uL 8.1  10.5  9.2   Hemoglobin 13.0 - 17.0 g/dL 15.3  15.9  16.0   Hematocrit 39.0 - 52.0 % 45.9  46.3  47.5   Platelets 150 - 400 K/uL 188  222  233       Latest Ref Rng & Units 11/04/2021    1:47 PM 08/05/2021    8:50 AM 09/05/2015    9:55 AM  CMP  Glucose 70 - 99 mg/dL 96  95  105   BUN 6 - 20 mg/dL 18  23  18    Creatinine 0.61 - 1.24 mg/dL 0.82  0.88  0.84   Sodium 135 - 145 mmol/L 137  141  140   Potassium 3.5 - 5.1 mmol/L 3.9  4.5  4.5   Chloride 98 - 111 mmol/L 105  104  103   CO2 22 - 32 mmol/L 26  21  22     Calcium 8.9 - 10.3 mg/dL 9.2  9.5  9.3   Total  Protein 6.5 - 8.1 g/dL 7.6  7.6  7.4   Total Bilirubin 0.3 - 1.2 mg/dL 0.5  0.4  0.5   Alkaline Phos 38 - 126 U/L 96  101  93   AST 15 - 41 U/L 19  21  21    ALT 0 - 44 U/L 20  21  26      DIAGNOSTIC IMAGING:  I have independently reviewed the scans and discussed with the patient. No results found.   ASSESSMENT:  Metastatic kidney cancer to the bones: - CT chest lung cancer screening scan (08/31/2021): Lung RADS 1S.  Possible soft tissue fullness in the upper pole of the left kidney. - US renal (09/15/2021): Mass with solid component in the upper pole of the left kidney measuring 6.1 cm, highly suspicious for RCC. - MRI of the abdomen (09/22/2021): 6.5 cm left kidney upper pole solid enhancing renal mass favoring RCC.  Scattered enhancing bone lesions suspicious for osseous metastatic disease, largest at L3 and in the central sacrum.  1.3 x 1.4 cm mass of the left adrenal gland has indeterminate characteristics by MRI but on CT chest seems to have a low-density favoring adenoma.  No tumor thrombus in the left renal vein. - PET scan (10/07/2021): Hypermetabolic left kidney mass with SUV 7.1.  Bilateral abdominal retroperitoneal hypermetabolic lymph nodes.  Multifocal bone metastasis.  Central sacrum, inferior aspect of L3 vertebral body, T8 body, C7 spinous process and medial right clavicle. 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  Biopsy shows a poorly differentiated neoplasm, IHC positive for CK8/18 (subset), CD10 and cytokeratin AE 1/3.  Cells are negative for CK20, S100, HMB45, SOX10, calretinin, P504S, prostein, PSA, desmin and CDX2. - I have talked to our pathologist Dr. Melina Copa who thought it is most likely clear-cell histology although IHC does not support it. - IMDC criteria: 1 risk factor with less than 1 year from time of diagnosis to systemic therapy.  Intermediate risk  group - Opdivo and Yervoy cycle 1 started on 11/17/2021. 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  He is a potential candidate for belzutifan on further progression.    Social/family history: - His nephew lives with him.  He has a 11-year-old daughter who was given for adoption.  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  He is a current active smoker, 1 pack/day for close to 40 years.  He drinks 1 beer at night. - Maternal aunt had breast cancer.  Maternal cousin has prostate cancer and another maternal cousin had breast cancer.  Maternal cousin also had kidney removed, thought to be secondary to cancer.  Paternal grandfather had lung cancer.   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 - He reported dry skin and fatigue which lasted about 1 to 2 days.  He is working full-time. - I have discussed results of NGS testing which showed VHL pathogenic variant. - Recommend genetic testing because of VHL pathogenic variant. - Reviewed labs today which showed creatinine 1.02 and normal LFTs.  CBC was grossly normal.  TSH was 1.09. - Proceed with cycle 2.  RTC 3 weeks for follow-up with repeat labs and treatment.  Plan to repeat scans after cycle 4.  2.  Low back pain: - He reports low back pain and neck pain has improved since cycle 1. - Continue Mobic and Flexeril as needed.  3.  Bone metastatic disease: - We have talked about role of denosumab to decrease skeletal related events. - We talked about side effects including hypocalcemia and rare chance of ONJ. - Recommend dental evaluation prior to start of therapy.   Orders placed this encounter:  No orders of the defined types were placed in this encounter.    Derek Jack,  MD [facility] [phone]   I, Thana Ates, am acting as a scribe for Dr. Derek Jack.  I, Derek Jack MD, have reviewed the above documentation for accuracy and completeness, and I agree with the above.

## 2021-12-08 NOTE — Progress Notes (Signed)
Patient presents today for Opdivo and Yervoy infusions per providers order.  Vital signs and labs within parameters for treatment.  Patient has no new complaints at this time.  Message received from Anastasio Champion RN/Dr. Delton Coombes patient okay for treatment.  Marchelle Gearing given today per MD orders.  Stable during infusion without adverse affects.  Vital signs stable.  No complaints at this time.  Discharge from clinic ambulatory in stable condition.  Alert and oriented X 3.  Follow up with [facility] as scheduled.

## 2021-12-08 NOTE — Progress Notes (Signed)
Ipilimumab (YERVOY) Patient Monitoring Assessment   Is the patient experiencing any of the following general symptoms?:  '[]'$ Difficulty performing normal activities '[]'$ Feeling sluggish or cold all the time '[]'$ Unusual weight gain '[]'$ Constant or unusual headaches '[]'$ Feeling dizzy or faint '[]'$ Changes in eyesight (blurry vision, double vision, or other vision problems) '[]'$ Changes in mood or behavior (ex: decreased sex drive, irritability, or forgetfulness) '[]'$ Starting new medications (ex: steroids, other medications that lower immune response) '[x]'$ Patient is not experiencing any of the general symptoms above.    Gastrointestinal  Patient is having 1 bowel movements each day.  Is this different from baseline? '[]'$ Yes '[x]'$ No Are your stools watery or do they have a foul smell? '[]'$ Yes '[x]'$ No Have you seen blood in your stools? '[]'$ Yes '[x]'$ No Are your stools dark, tarry, or sticky? '[]'$ Yes '[x]'$ No Are you having pain or tenderness in your belly? '[]'$ Yes '[x]'$ No  Skin Does your skin itch? '[]'$ Yes '[x]'$ No Do you have a rash? '[]'$ Yes '[x]'$ No Has your skin blistered and/or peeled? '[]'$ Yes '[x]'$ No Do you have sores in your mouth? '[]'$ Yes '[x]'$ No  Hepatic Has your urine been dark or tea colored? '[]'$ Yes '[x]'$ No Have you noticed that your skin or the whites of your eyes are turning yellow? '[]'$ Yes '[x]'$ No Are you bleeding or bruising more easily than normal? '[]'$ Yes '[x]'$ No Are you nauseous and/or vomiting? '[]'$ Yes '[x]'$ No Do you have pain on the right side of your stomach? '[]'$ Yes '[x]'$ No  Neurologic  Are you having unusual weakness of legs, arms, or face? '[]'$ Yes '[x]'$ No Are you having numbness or tingling in your hands or feet? '[]'$ Yes '[x]'$ No  Marcelino Scot, [facility]

## 2021-12-08 NOTE — Patient Instructions (Signed)
[facility]  Discharge Instructions: Thank you for choosing [facility] to provide your oncology and hematology care.  If you have a lab appointment with the [facility], please come in thru the Main Entrance and check in at the main information desk.  Wear comfortable clothing and clothing appropriate for easy access to any Portacath or PICC line.   We strive to give you quality time with your provider. You may need to reschedule your appointment if you arrive late (15 or more minutes).  Arriving late affects you and other patients whose appointments are after yours.  Also, if you miss three or more appointments without notifying the office, you may be dismissed from the clinic at the provider's discretion.      For prescription refill requests, have your pharmacy contact our office and allow 72 hours for refills to be completed.    Today you received the following chemotherapy and/or immunotherapy agents Rae Halsted      To help prevent nausea and vomiting after your treatment, we encourage you to take your nausea medication as directed.  BELOW ARE SYMPTOMS THAT SHOULD BE REPORTED IMMEDIATELY: *FEVER GREATER THAN 100.4 F (38 C) OR HIGHER *CHILLS OR SWEATING *NAUSEA AND VOMITING THAT IS NOT CONTROLLED WITH YOUR NAUSEA MEDICATION *UNUSUAL SHORTNESS OF BREATH *UNUSUAL BRUISING OR BLEEDING *URINARY PROBLEMS (pain or burning when urinating, or frequent urination) *BOWEL PROBLEMS (unusual diarrhea, constipation, pain near the anus) TENDERNESS IN MOUTH AND THROAT WITH OR WITHOUT PRESENCE OF ULCERS (sore throat, sores in mouth, or a toothache) UNUSUAL RASH, SWELLING OR PAIN  UNUSUAL VAGINAL DISCHARGE OR ITCHING   Items with * indicate a potential emergency and should be followed up as soon as possible or go to the Emergency Department if any problems should occur.  Please show the CHEMOTHERAPY ALERT CARD or IMMUNOTHERAPY ALERT CARD at check-in to the Emergency  Department and triage nurse.  Should you have questions after your visit or need to cancel or reschedule your appointment, please contact [facility] [phone]  and follow the prompts.  Office hours are 8:00 a.m. to 4:30 p.m. Monday - Friday. Please note that voicemails left after 4:00 p.m. may not be returned until the following business day.  We are closed weekends and major holidays. You have access to a nurse at all times for urgent questions. Please call the main number to the clinic [phone] and follow the prompts.  For any non-urgent questions, you may also contact your provider using MyChart. We now offer e-Visits for anyone 58 and older to request care online for non-urgent symptoms. For details visit [email or web address].   Also download the MyChart app! Go to the app store, search "MyChart", open the app, select [facility], and log in with your MyChart username and password.  Masks are optional in the cancer centers. If you would like for your care team to wear a mask while they are taking care of you, please let them know. For doctor visits, patients may have with them one support person who is at least 56 years old. At this time, visitors are not allowed in the infusion area.

## 2021-12-29 ENCOUNTER — Inpatient Hospital Stay (HOSPITAL_COMMUNITY): Payer: Commercial Managed Care - PPO | Admitting: Hematology

## 2021-12-29 ENCOUNTER — Inpatient Hospital Stay (HOSPITAL_COMMUNITY): Payer: Commercial Managed Care - PPO

## 2021-12-29 ENCOUNTER — Inpatient Hospital Stay (HOSPITAL_COMMUNITY): Payer: Commercial Managed Care - PPO | Attending: Physician Assistant

## 2021-12-29 VITALS — BP 116/77 | HR 52 | Temp 97.5°F | Resp 18

## 2021-12-29 DIAGNOSIS — M899 Disorder of bone, unspecified: Secondary | ICD-10-CM

## 2021-12-29 DIAGNOSIS — C7951 Secondary malignant neoplasm of bone: Secondary | ICD-10-CM | POA: Diagnosis not present

## 2021-12-29 DIAGNOSIS — Z5112 Encounter for antineoplastic immunotherapy: Secondary | ICD-10-CM | POA: Insufficient documentation

## 2021-12-29 DIAGNOSIS — Z79899 Other long term (current) drug therapy: Secondary | ICD-10-CM | POA: Insufficient documentation

## 2021-12-29 DIAGNOSIS — C642 Malignant neoplasm of left kidney, except renal pelvis: Secondary | ICD-10-CM | POA: Diagnosis present

## 2021-12-29 DIAGNOSIS — Z95828 Presence of other vascular implants and grafts: Secondary | ICD-10-CM

## 2021-12-29 DIAGNOSIS — C649 Malignant neoplasm of unspecified kidney, except renal pelvis: Secondary | ICD-10-CM

## 2021-12-29 DIAGNOSIS — N2889 Other specified disorders of kidney and ureter: Secondary | ICD-10-CM

## 2021-12-29 LAB — COMPREHENSIVE METABOLIC PANEL
ALT: 24 U/L (ref 0–44)
AST: 17 U/L (ref 15–41)
Albumin: 3.9 g/dL (ref 3.5–5.0)
Alkaline Phosphatase: 89 U/L (ref 38–126)
Anion gap: 6 (ref 5–15)
BUN: 23 mg/dL — ABNORMAL HIGH (ref 6–20)
CO2: 27 mmol/L (ref 22–32)
Calcium: 9.1 mg/dL (ref 8.9–10.3)
Chloride: 106 mmol/L (ref 98–111)
Creatinine, Ser: 0.87 mg/dL (ref 0.61–1.24)
GFR, Estimated: >60 mL/min (ref >60)
Glucose, Bld: 88 mg/dL (ref 70–99)
Potassium: 4.3 mmol/L (ref 3.5–5.1)
Sodium: 139 mmol/L (ref 135–145)
Total Bilirubin: 0.4 mg/dL (ref 0.3–1.2)
Total Protein: 7.5 g/dL (ref 6.5–8.1)

## 2021-12-29 LAB — CBC WITH DIFFERENTIAL/PLATELET
Abs Immature Granulocytes: 0.02 10*3/uL (ref 0.00–0.07)
Basophils Absolute: 0.1 10*3/uL (ref 0.0–0.1)
Basophils Relative: 1 %
Eosinophils Absolute: 0.5 10*3/uL (ref 0.0–0.5)
Eosinophils Relative: 4 %
HCT: 42.3 % (ref 39.0–52.0)
Hemoglobin: 13.9 g/dL (ref 13.0–17.0)
Immature Granulocytes: 0 %
Lymphocytes Relative: 24 %
Lymphs Abs: 2.7 10*3/uL (ref 0.7–4.0)
MCH: 29.9 pg (ref 26.0–34.0)
MCHC: 32.9 g/dL (ref 30.0–36.0)
MCV: 91 fL (ref 80.0–100.0)
Monocytes Absolute: 0.7 10*3/uL (ref 0.1–1.0)
Monocytes Relative: 6 %
Neutro Abs: 7.3 10*3/uL (ref 1.7–7.7)
Neutrophils Relative %: 65 %
Platelets: 218 10*3/uL (ref 150–400)
RBC: 4.65 MIL/uL (ref 4.22–5.81)
RDW: 14.8 % (ref 11.5–15.5)
WBC: 11.3 10*3/uL — ABNORMAL HIGH (ref 4.0–10.5)
nRBC: 0 % (ref 0.0–0.2)

## 2021-12-29 LAB — TSH: TSH: 0.987 u[IU]/mL (ref 0.350–4.500)

## 2021-12-29 MED ORDER — SODIUM CHLORIDE 0.9 % IV SOLN
1.0000 mg/kg | Freq: Once | INTRAVENOUS | Status: AC
  Administered 2021-12-29: 75 mg via INTRAVENOUS
  Filled 2021-12-29: qty 15

## 2021-12-29 MED ORDER — SODIUM CHLORIDE 0.9 % IV SOLN: Freq: Once | INTRAVENOUS | Status: AC

## 2021-12-29 MED ORDER — HEPARIN SOD (PORK) LOCK FLUSH 100 UNIT/ML IV SOLN
500.0000 [IU] | Freq: Once | INTRAVENOUS | Status: AC | PRN
  Administered 2021-12-29: 500 [IU]

## 2021-12-29 MED ORDER — SODIUM CHLORIDE 0.9 % IV SOLN
2.7500 mg/kg | Freq: Once | INTRAVENOUS | Status: AC
  Administered 2021-12-29: 200 mg via INTRAVENOUS
  Filled 2021-12-29: qty 20

## 2021-12-29 MED ORDER — SODIUM CHLORIDE 0.9% FLUSH
10.0000 mL | INTRAVENOUS | Status: DC | PRN
  Administered 2021-12-29: 10 mL

## 2021-12-29 MED ORDER — DIPHENHYDRAMINE HCL 50 MG/ML IJ SOLN
25.0000 mg | Freq: Once | INTRAMUSCULAR | Status: AC
  Administered 2021-12-29: 25 mg via INTRAVENOUS
  Filled 2021-12-29: qty 1

## 2021-12-29 MED ORDER — SODIUM CHLORIDE 0.9 % IV SOLN
3.0000 mg/kg | Freq: Once | INTRAVENOUS | Status: DC
  Filled 2021-12-29: qty 21.8

## 2021-12-29 MED ORDER — FAMOTIDINE IN NACL 20-0.9 MG/50ML-% IV SOLN
20.0000 mg | Freq: Once | INTRAVENOUS | Status: AC
  Administered 2021-12-29: 20 mg via INTRAVENOUS
  Filled 2021-12-29: qty 50

## 2021-12-29 NOTE — Progress Notes (Signed)
Patient weight did not exceed 10% change will maintain Opdivo at 200 mg (3 mg/kg) rounded for vial size to prevent excess waste.  Jacob Reynolds, PharmD

## 2021-12-29 NOTE — Progress Notes (Signed)
Ipilimumab (YERVOY) Patient Monitoring Assessment   Is the patient experiencing any of the following general symptoms?:  '[]'$ Difficulty performing normal activities '[]'$ Feeling sluggish or cold all the time '[]'$ Unusual weight gain '[]'$ Constant or unusual headaches '[]'$ Feeling dizzy or faint '[]'$ Changes in eyesight (blurry vision, double vision, or other vision problems) '[]'$ Changes in mood or behavior (ex: decreased sex drive, irritability, or forgetfulness) '[]'$ Starting new medications (ex: steroids, other medications that lower immune response) '[x]'$ Patient is not experiencing any of the general symptoms above.    Gastrointestinal  Patient is having 1 bowel movements each day.  Is this different from baseline? '[]'$ Yes '[x]'$ No Are your stools watery or do they have a foul smell? '[]'$ Yes '[x]'$ No Have you seen blood in your stools? '[]'$ Yes '[x]'$ No Are your stools dark, tarry, or sticky? '[]'$ Yes '[x]'$ No Are you having pain or tenderness in your belly? '[]'$ Yes '[x]'$ No  Skin Does your skin itch? '[]'$ Yes '[x]'$ No Do you have a rash? '[]'$ Yes '[x]'$ No Has your skin blistered and/or peeled? '[]'$ Yes '[x]'$ No Do you have sores in your mouth? '[]'$ Yes '[x]'$ No  Hepatic Has your urine been dark or tea colored? '[]'$ Yes '[x]'$ No Have you noticed that your skin or the whites of your eyes are turning yellow? '[]'$ Yes '[x]'$ No Are you bleeding or bruising more easily than normal? '[]'$ Yes '[x]'$ No Are you nauseous and/or vomiting? '[]'$ Yes '[x]'$ No Do you have pain on the right side of your stomach? '[]'$ Yes '[x]'$ No  Neurologic  Are you having unusual weakness of legs, arms, or face? '[]'$ Yes '[x]'$ No Are you having numbness or tingling in your hands or feet? '[]'$ Yes '[x]'$ No  Jacob Reynolds

## 2021-12-29 NOTE — Patient Instructions (Signed)
Jacob Reynolds  Discharge Instructions: Thank you for choosing [facility] to provide your oncology and hematology care.  If you have a lab appointment with the [facility], please come in thru the Main Entrance and check in at the main information desk.  Wear comfortable clothing and clothing appropriate for easy access to any Portacath or PICC line.   We strive to give you quality time with your provider. You may need to reschedule your appointment if you arrive late (15 or more minutes).  Arriving late affects you and other patients whose appointments are after yours.  Also, if you miss three or more appointments without notifying the office, you may be dismissed from the clinic at the provider's discretion.      For prescription refill requests, have your pharmacy contact our office and allow 72 hours for refills to be completed.    Today you received the following chemotherapy and/or immunotherapy agents Opdivo and Yervoy, return as scheduled.   To help prevent nausea and vomiting after your treatment, we encourage you to take your nausea medication as directed.  BELOW ARE SYMPTOMS THAT SHOULD BE REPORTED IMMEDIATELY: *FEVER GREATER THAN 100.4 F (38 C) OR HIGHER *CHILLS OR SWEATING *NAUSEA AND VOMITING THAT IS NOT CONTROLLED WITH YOUR NAUSEA MEDICATION *UNUSUAL SHORTNESS OF BREATH *UNUSUAL BRUISING OR BLEEDING *URINARY PROBLEMS (pain or burning when urinating, or frequent urination) *BOWEL PROBLEMS (unusual diarrhea, constipation, pain near the anus) TENDERNESS IN MOUTH AND THROAT WITH OR WITHOUT PRESENCE OF ULCERS (sore throat, sores in mouth, or a toothache) UNUSUAL RASH, SWELLING OR PAIN  UNUSUAL VAGINAL DISCHARGE OR ITCHING   Items with * indicate a potential emergency and should be followed up as soon as possible or go to the Emergency Department if any problems should occur.  Please show the CHEMOTHERAPY ALERT CARD or IMMUNOTHERAPY ALERT CARD at  check-in to the Emergency Department and triage nurse.  Should you have questions after your visit or need to cancel or reschedule your appointment, please contact [facility] [phone]  and follow the prompts.  Office hours are 8:00 a.m. to 4:30 p.m. Monday - Friday. Please note that voicemails left after 4:00 p.m. may not be returned until the following business day.  We are closed weekends and major holidays. You have access to a nurse at all times for urgent questions. Please call the main number to the clinic [phone] and follow the prompts.  For any non-urgent questions, you may also contact your provider using MyChart. We now offer e-Visits for anyone 38 and older to request care online for non-urgent symptoms. For details visit [email or web address].   Also download the MyChart app! Go to the app store, search "MyChart", open the app, select [facility], and log in with your MyChart username and password.  Masks are optional in the cancer centers. If you would like for your care team to wear a mask while they are taking care of you, please let them know. For doctor visits, patients may have with them one support person who is at least 56 years old. At this time, visitors are not allowed in the infusion area.

## 2021-12-29 NOTE — Progress Notes (Signed)
[facility] [address] [place], [place] [address]   CLINIC:  Medical Oncology/Hematology  PCP:  [name], FNP [address] [place] [address] [phone]   REASON FOR VISIT:  Follow-up for renal mass and bone lesion  PRIOR THERAPY: none  NGS Results: not done  CURRENT THERAPY: Nivolumab + Ipilimumab q21d / Nivolumab q28d  BRIEF ONCOLOGIC HISTORY:  Oncology History  Metastatic renal cell carcinoma to bone (HCC)  11/04/2021 Initial Diagnosis   Metastatic renal cell carcinoma to bone (HCC)   11/17/2021 -  Chemotherapy   Patient is on Treatment Plan : RENAL CELL CARCINOMA Nivolumab + Ipilimumab q21d / Nivolumab q28d       CANCER STAGING:  Cancer Staging  Metastatic renal cell carcinoma to bone [facility]) Staging form: Kidney, AJCC 8th Edition - Clinical stage from 11/04/2021: Stage IV (cT1b, cN1, pM1) - Unsigned   INTERVAL HISTORY:  Jacob Reynolds, a 56 y.o. male, returns for routine follow-up and consideration for next cycle of chemotherapy. Kincade was last seen on 12/08/2021.  Due for cycle #3 of ipilimumab + nivolumab  today.   Overall, he tells me he has been feeling pretty well. He reports an episode of leg swellings.   Overall, he feels ready for next cycle of chemo today.    REVIEW OF SYSTEMS:  Review of Systems  Constitutional:  Negative for appetite change and fatigue.  Cardiovascular:  Positive for chest pain and leg swelling (x1).  Neurological:  Positive for headaches.  All other systems reviewed and are negative.   PAST MEDICAL/SURGICAL HISTORY:  Past Medical History:  Diagnosis Date   Plantar fasciitis    Port-A-Cath in place 11/10/2021   Past Surgical History:  Procedure Laterality Date   IR IMAGING GUIDED PORT INSERTION  10/15/2021    SOCIAL HISTORY:  Social History   Socioeconomic History   Marital status: Single    Spouse name: Not on file   Number of children: 1   Years of education: 11   Highest  education level: 11th grade  Occupational History   Not on file  Tobacco Use   Smoking status: Every Day    Packs/day: 1.00    Years: 36.00    Total pack years: 36.00    Types: Cigarettes    Start date: 06/13/1978   Smokeless tobacco: Never  Substance and Sexual Activity   Alcohol use: Yes    Alcohol/week: 7.0 standard drinks of alcohol    Types: 7 Cans of beer per week   Drug use: Yes    Frequency: 1.0 times per week    Types: Marijuana   Sexual activity: Not on file  Other Topics Concern   Not on file  Social History Narrative   Not on file   Social Determinants of Health   Financial Resource Strain: Not on file  Food Insecurity: Not on file  Transportation Needs: Not on file  Physical Activity: Not on file  Stress: Not on file  Social Connections: Not on file  Intimate Partner Violence: Not on file    FAMILY HISTORY:  Family History  Problem Relation Age of Onset   Hypertension Mother    Parkinson's disease Father     CURRENT MEDICATIONS:  Current Outpatient Medications  Medication Sig Dispense Refill   ciclopirox (PENLAC) 8 % solution Apply topically at bedtime. Apply over nail and surrounding skin. Apply daily over previous coat. After seven (7) days, may remove with alcohol and continue  cycle. 6.6 mL 0   cyclobenzaprine (FLEXERIL) 10 MG tablet Take 1 tablet (10 mg total) by mouth 3 (three) times daily as needed for muscle spasms. 90 tablet 1   Ipilimumab (YERVOY IV) Inject into the vein every 21 ( twenty-one) days. Every 21 days x 4 cycles then d/c     lidocaine-prilocaine (EMLA) cream Apply a small amount to port a cath site and cover with plastic wrap 1 hour prior to infusion appointments 30 g 3   meloxicam (MOBIC) 15 MG tablet Take 1 tablet (15 mg total) by mouth daily as needed for pain. 30 tablet 0   Nivolumab (OPDIVO IV) Inject into the vein every 21 ( twenty-one) days. Every 21 days x 4 cycles, then every 28 days     No current facility-administered  medications for this visit.    ALLERGIES:  Allergies  Allergen Reactions   Iodine     Throat felt like closed    PHYSICAL EXAM:  Performance status (ECOG): 0 - Asymptomatic  There were no vitals filed for this visit. Wt Readings from Last 3 Encounters:  12/29/21 164 lb (74.4 kg)  12/08/21 162 lb 12.8 oz (73.8 kg)  11/17/21 164 lb 6.4 oz (74.6 kg)   Physical Exam Vitals reviewed.  Constitutional:      Appearance: Normal appearance.  Cardiovascular:     Rate and Rhythm: Normal rate and regular rhythm.     Pulses: Normal pulses.     Heart sounds: Normal heart sounds.  Pulmonary:     Effort: Pulmonary effort is normal.     Breath sounds: Normal breath sounds.  Neurological:     General: No focal deficit present.     Mental Status: He is alert and oriented to person, place, and time.  Psychiatric:        Mood and Affect: Mood normal.        Behavior: Behavior normal.     LABORATORY DATA:  I have reviewed the labs as listed.     Latest Ref Rng & Units 12/29/2021   11:44 AM 12/08/2021    8:48 AM 11/04/2021    1:47 PM  CBC  WBC 4.0 - 10.5 K/uL 11.3  9.4  8.1   Hemoglobin 13.0 - 17.0 g/dL 13.9  14.7  15.3   Hematocrit 39.0 - 52.0 % 42.3  44.5  45.9   Platelets 150 - 400 K/uL 218  180  188       Latest Ref Rng & Units 12/29/2021   11:44 AM 12/08/2021    8:48 AM 11/04/2021    1:47 PM  CMP  Glucose 70 - 99 mg/dL 88  160  96   BUN 6 - 20 mg/dL $Remove'23  23  18   'GiRaDbW$ Creatinine 0.61 - 1.24 mg/dL 0.87  1.02  0.82   Sodium 135 - 145 mmol/L 139  140  137   Potassium 3.5 - 5.1 mmol/L 4.3  4.0  3.9   Chloride 98 - 111 mmol/L 106  103  105   CO2 22 - 32 mmol/L $RemoveB'27  26  26   'RBXZNduO$ Calcium 8.9 - 10.3 mg/dL 9.1  8.8  9.2   Total Protein 6.5 - 8.1 g/dL 7.5  7.1  7.6   Total Bilirubin 0.3 - 1.2 mg/dL 0.4  0.6  0.5   Alkaline Phos 38 - 126 U/L 89  84  96   AST 15 - 41 U/L $Remo'17  20  19   'NgpJW$ ALT 0 - 44 U/L  $'24  21  20     'f$ DIAGNOSTIC IMAGING:  I have independently reviewed the scans and discussed  with the patient. No results found.   ASSESSMENT:  Metastatic kidney cancer to the bones: - CT chest lung cancer screening scan (08/31/2021): Lung RADS 1S.  Possible soft tissue fullness in the upper pole of the left kidney. - US renal (09/15/2021): Mass with solid component in the upper pole of the left kidney measuring 6.1 cm, highly suspicious for RCC. - MRI of the abdomen (09/22/2021): 6.5 cm left kidney upper pole solid enhancing renal mass favoring RCC.  Scattered enhancing bone lesions suspicious for osseous metastatic disease, largest at L3 and in the central sacrum.  1.3 x 1.4 cm mass of the left adrenal gland has indeterminate characteristics by MRI but on CT chest seems to have a low-density favoring adenoma.  No tumor thrombus in the left renal vein. - PET scan (10/07/2021): Hypermetabolic left kidney mass with SUV 7.1.  Bilateral abdominal retroperitoneal hypermetabolic lymph nodes.  Multifocal bone metastasis.  Central sacrum, inferior aspect of L3 vertebral body, T8 body, C7 spinous process and medial right clavicle. 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  Biopsy shows a poorly differentiated neoplasm, IHC positive for CK8/18 (subset), CD10 and cytokeratin AE 1/3.  Cells are negative for CK20, S100, HMB45, SOX10, calretinin, P504S, prostein, PSA, desmin and CDX2. - I have talked to our pathologist Dr. Melina Copa who thought it is most likely clear-cell histology although IHC does not support it. - IMDC criteria: 1 risk factor with less than 1 year from time of diagnosis to systemic therapy.  Intermediate risk group - Opdivo and Yervoy cycle 1 started on 11/17/2021. 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  He is a potential candidate for belzutifan on further progression.    Social/family history: - His nephew lives with him.   He has a 41-year-old daughter who was given for adoption.  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  He is a current active smoker, 1 pack/day for close to 40 years.  He drinks 1 beer at night. - Maternal aunt had breast cancer.  Maternal cousin has prostate cancer and another maternal cousin had breast cancer.  Maternal cousin also had kidney removed, thought to be secondary to cancer.  Paternal grandfather had lung cancer.   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 - He does not report any immunotherapy related side effects. 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  Hence we have recommended genetic testing. - Reviewed labs today which showed normal LFTs and creatinine.  CBC was grossly normal.  TSH is 0.9. - Proceed with cycle 3 today and cycle 4 in 3 weeks. - RTC 6 weeks for follow-up with repeat imaging in the form of CT CAP and labs.  2.  Low back pain: - He reports improvement in low back pain and neck pain since cycle 1 of chemotherapy. - Continue Mobic and Flexeril as needed.  3.  Bone metastatic disease: - We will start denosumab to decrease skeletal related events after dental evaluation.   Orders placed this encounter:  Orders Placed This Encounter  Procedures   CT CHEST ABDOMEN PELVIS W CONTRAST  Derek Jack, MD [facility] [phone]   I, Thana Ates, am acting as a scribe for Dr. Derek Jack.  I, Derek Jack MD, have reviewed the above documentation for accuracy and completeness, and I agree with the above.

## 2021-12-29 NOTE — Progress Notes (Signed)
Patient presents today for Opdivo/Yervoy, patient okay for treatment per Dr. Delton Coombes. Patient tolerated chemotherapy with no complaints voiced. Side effects with management reviewed understanding verbalized. Port site clean and dry with no bruising or swelling noted at site. Good blood return noted before and after administration of chemotherapy. Band aid applied. Patient left in satisfactory condition with VSS and no s/s of distress noted.

## 2021-12-29 NOTE — Patient Instructions (Addendum)
[place] at [facility] Discharge Instructions   You were seen and examined today by Dr. Delton Coombes.  He reviewed the results of your lab work which are normal/stable.   We will proceed with your treatment today.  We will repeat a scan prior to your next office visit.   Return as scheduled.    Thank you for choosing [place] at [facility] to provide your oncology and hematology care.  To afford each patient quality time with our provider, please arrive at least 15 minutes before your scheduled appointment time.   If you have a lab appointment with the [facility] please come in thru the Main Entrance and check in at the main information desk.  You need to re-schedule your appointment should you arrive 10 or more minutes late.  We strive to give you quality time with our providers, and arriving late affects you and other patients whose appointments are after yours.  Also, if you no show three or more times for appointments you may be dismissed from the clinic at the providers discretion.     Again, thank you for choosing [facility].  Our hope is that these requests will decrease the amount of time that you wait before being seen by our physicians.       _____________________________________________________________  Should you have questions after your visit to [facility], please contact our office at [phone] and follow the prompts.  Our office hours are 8:00 a.m. and 4:30 p.m. Monday - Friday.  Please note that voicemails left after 4:00 p.m. may not be returned until the following business day.  We are closed weekends and major holidays.  You do have access to a nurse 24-7, just call the main number to the clinic [phone] and do not press any options, hold on the line and a nurse will answer the phone.    For prescription refill requests, have your pharmacy contact our office and allow 72 hours.     Due to Covid, you will need to wear a mask upon entering the hospital. If you do not have a mask, a mask will be given to you at the Main Entrance upon arrival. For doctor visits, patients may have 1 support person age 35 or older with them. For treatment visits, patients can not have anyone with them due to social distancing guidelines and our immunocompromised population.

## 2022-01-03 ENCOUNTER — Other Ambulatory Visit: Payer: Self-pay

## 2022-01-11 ENCOUNTER — Other Ambulatory Visit: Payer: Self-pay

## 2022-01-19 ENCOUNTER — Inpatient Hospital Stay: Payer: Commercial Managed Care - PPO | Attending: Hematology

## 2022-01-19 ENCOUNTER — Inpatient Hospital Stay: Payer: Commercial Managed Care - PPO

## 2022-01-19 ENCOUNTER — Inpatient Hospital Stay (HOSPITAL_COMMUNITY): Payer: Commercial Managed Care - PPO | Admitting: Hematology

## 2022-01-19 VITALS — BP 120/88 | HR 53 | Temp 97.5°F | Resp 18 | Wt 165.4 lb

## 2022-01-19 DIAGNOSIS — C649 Malignant neoplasm of unspecified kidney, except renal pelvis: Secondary | ICD-10-CM

## 2022-01-19 DIAGNOSIS — Z5112 Encounter for antineoplastic immunotherapy: Secondary | ICD-10-CM | POA: Insufficient documentation

## 2022-01-19 DIAGNOSIS — C642 Malignant neoplasm of left kidney, except renal pelvis: Secondary | ICD-10-CM | POA: Insufficient documentation

## 2022-01-19 DIAGNOSIS — C7951 Secondary malignant neoplasm of bone: Secondary | ICD-10-CM | POA: Insufficient documentation

## 2022-01-19 DIAGNOSIS — N2889 Other specified disorders of kidney and ureter: Secondary | ICD-10-CM

## 2022-01-19 DIAGNOSIS — M899 Disorder of bone, unspecified: Secondary | ICD-10-CM

## 2022-01-19 DIAGNOSIS — Z95828 Presence of other vascular implants and grafts: Secondary | ICD-10-CM

## 2022-01-19 LAB — CBC WITH DIFFERENTIAL/PLATELET
Abs Immature Granulocytes: 0.03 10*3/uL (ref 0.00–0.07)
Basophils Absolute: 0.1 10*3/uL (ref 0.0–0.1)
Basophils Relative: 1 %
Eosinophils Absolute: 0.5 10*3/uL (ref 0.0–0.5)
Eosinophils Relative: 5 %
HCT: 42.8 % (ref 39.0–52.0)
Hemoglobin: 14 g/dL (ref 13.0–17.0)
Immature Granulocytes: 0 %
Lymphocytes Relative: 22 %
Lymphs Abs: 2.1 10*3/uL (ref 0.7–4.0)
MCH: 30 pg (ref 26.0–34.0)
MCHC: 32.7 g/dL (ref 30.0–36.0)
MCV: 91.8 fL (ref 80.0–100.0)
Monocytes Absolute: 0.8 10*3/uL (ref 0.1–1.0)
Monocytes Relative: 9 %
Neutro Abs: 6.3 10*3/uL (ref 1.7–7.7)
Neutrophils Relative %: 63 %
Platelets: 171 10*3/uL (ref 150–400)
RBC: 4.66 MIL/uL (ref 4.22–5.81)
RDW: 15.3 % (ref 11.5–15.5)
WBC: 9.8 10*3/uL (ref 4.0–10.5)
nRBC: 0 % (ref 0.0–0.2)

## 2022-01-19 LAB — COMPREHENSIVE METABOLIC PANEL
ALT: 19 U/L (ref 0–44)
AST: 17 U/L (ref 15–41)
Albumin: 3.8 g/dL (ref 3.5–5.0)
Alkaline Phosphatase: 83 U/L (ref 38–126)
Anion gap: 6 (ref 5–15)
BUN: 22 mg/dL — ABNORMAL HIGH (ref 6–20)
CO2: 26 mmol/L (ref 22–32)
Calcium: 9 mg/dL (ref 8.9–10.3)
Chloride: 107 mmol/L (ref 98–111)
Creatinine, Ser: 0.97 mg/dL (ref 0.61–1.24)
GFR, Estimated: >60 mL/min (ref >60)
Glucose, Bld: 81 mg/dL (ref 70–99)
Potassium: 4.3 mmol/L (ref 3.5–5.1)
Sodium: 139 mmol/L (ref 135–145)
Total Bilirubin: 0.5 mg/dL (ref 0.3–1.2)
Total Protein: 7.2 g/dL (ref 6.5–8.1)

## 2022-01-19 LAB — MAGNESIUM: Magnesium: 2.2 mg/dL (ref 1.7–2.4)

## 2022-01-19 MED ORDER — SODIUM CHLORIDE 0.9 % IV SOLN: Freq: Once | INTRAVENOUS | Status: AC

## 2022-01-19 MED ORDER — SODIUM CHLORIDE 0.9 % IV SOLN
200.0000 mg | Freq: Once | INTRAVENOUS | Status: AC
  Administered 2022-01-19: 200 mg via INTRAVENOUS
  Filled 2022-01-19: qty 20

## 2022-01-19 MED ORDER — DIPHENHYDRAMINE HCL 50 MG/ML IJ SOLN
25.0000 mg | Freq: Once | INTRAMUSCULAR | Status: AC
  Administered 2022-01-19: 25 mg via INTRAVENOUS

## 2022-01-19 MED ORDER — FAMOTIDINE IN NACL 20-0.9 MG/50ML-% IV SOLN
INTRAVENOUS | Status: AC
  Filled 2022-01-19: qty 50

## 2022-01-19 MED ORDER — HEPARIN SOD (PORK) LOCK FLUSH 100 UNIT/ML IV SOLN
500.0000 [IU] | Freq: Once | INTRAVENOUS | Status: AC | PRN
  Administered 2022-01-19: 500 [IU]

## 2022-01-19 MED ORDER — SODIUM CHLORIDE 0.9% FLUSH
10.0000 mL | INTRAVENOUS | Status: DC | PRN
  Administered 2022-01-19: 10 mL

## 2022-01-19 MED ORDER — DIPHENHYDRAMINE HCL 50 MG/ML IJ SOLN
INTRAMUSCULAR | Status: AC
  Filled 2022-01-19: qty 1

## 2022-01-19 MED ORDER — SODIUM CHLORIDE 0.9 % IV SOLN
1.0000 mg/kg | Freq: Once | INTRAVENOUS | Status: AC
  Administered 2022-01-19: 75 mg via INTRAVENOUS
  Filled 2022-01-19: qty 15

## 2022-01-19 MED ORDER — FAMOTIDINE IN NACL 20-0.9 MG/50ML-% IV SOLN
20.0000 mg | Freq: Once | INTRAVENOUS | Status: AC
  Administered 2022-01-19: 20 mg via INTRAVENOUS

## 2022-01-19 NOTE — Progress Notes (Signed)
Patient tolerated chemotherapy with no complaints voiced. Side effects with management reviewed understanding verbalized. Port site clean and dry with no bruising or swelling noted at site. Good blood return noted before and after administration of chemotherapy. Band aid applied. Patient left in satisfactory condition with VSS and no s/s of distress noted. 

## 2022-01-19 NOTE — Progress Notes (Signed)
Maintain dose of Opdivo at 200 mg for today to accommodate vial rounding.  T.O. Dr Rhys Martini, PharmD

## 2022-01-19 NOTE — Progress Notes (Signed)
Patients port flushed without difficulty.  Good blood return noted with no bruising or swelling noted at site.  Stable during access and blood draw.  Patient to remain accessed for treatment. 

## 2022-01-19 NOTE — Patient Instructions (Signed)
[facility]  Discharge Instructions: Thank you for choosing [facility] to provide your oncology and hematology care.  If you have a lab appointment with the [facility], please come in thru the Main Entrance and check in at the main information desk.  Wear comfortable clothing and clothing appropriate for easy access to any Portacath or PICC line.   We strive to give you quality time with your provider. You may need to reschedule your appointment if you arrive late (15 or more minutes).  Arriving late affects you and other patients whose appointments are after yours.  Also, if you miss three or more appointments without notifying the office, you may be dismissed from the clinic at the provider's discretion.      For prescription refill requests, have your pharmacy contact our office and allow 72 hours for refills to be completed.    Today you received the following chemotherapy and/or immunotherapy agents Opdivo and Yervoy, return as scheduled.   To help prevent nausea and vomiting after your treatment, we encourage you to take your nausea medication as directed.  BELOW ARE SYMPTOMS THAT SHOULD BE REPORTED IMMEDIATELY: *FEVER GREATER THAN 100.4 F (38 C) OR HIGHER *CHILLS OR SWEATING *NAUSEA AND VOMITING THAT IS NOT CONTROLLED WITH YOUR NAUSEA MEDICATION *UNUSUAL SHORTNESS OF BREATH *UNUSUAL BRUISING OR BLEEDING *URINARY PROBLEMS (pain or burning when urinating, or frequent urination) *BOWEL PROBLEMS (unusual diarrhea, constipation, pain near the anus) TENDERNESS IN MOUTH AND THROAT WITH OR WITHOUT PRESENCE OF ULCERS (sore throat, sores in mouth, or a toothache) UNUSUAL RASH, SWELLING OR PAIN  UNUSUAL VAGINAL DISCHARGE OR ITCHING   Items with * indicate a potential emergency and should be followed up as soon as possible or go to the Emergency Department if any problems should occur.  Please show the CHEMOTHERAPY ALERT CARD or IMMUNOTHERAPY ALERT CARD  at check-in to the Emergency Department and triage nurse.  Should you have questions after your visit or need to cancel or reschedule your appointment, please contact [facility] [phone]  and follow the prompts.  Office hours are 8:00 a.m. to 4:30 p.m. Monday - Friday. Please note that voicemails left after 4:00 p.m. may not be returned until the following business day.  We are closed weekends and major holidays. You have access to a nurse at all times for urgent questions. Please call the main number to the clinic [phone] and follow the prompts.  For any non-urgent questions, you may also contact your provider using MyChart. We now offer e-Visits for anyone 37 and older to request care online for non-urgent symptoms. For details visit [email or web address].   Also download the MyChart app! Go to the app store, search "MyChart", open the app, select [facility], and log in with your MyChart username and password.  Masks are optional in the cancer centers. If you would like for your care team to wear a mask while they are taking care of you, please let them know. For doctor visits, patients may have with them one support person who is at least 56 years old. At this time, visitors are not allowed in the infusion area.

## 2022-01-19 NOTE — Progress Notes (Signed)
Ipilimumab (YERVOY) Patient Monitoring Assessment   Is the patient experiencing any of the following general symptoms?:  '[ ]'$ Difficulty performing normal activities '[ ]'$ Feeling sluggish or cold all the time '[ ]'$ Unusual weight gain '[ ]'$ Constant or unusual headaches '[ ]'$ Feeling dizzy or faint '[ ]'$ Changes in eyesight (blurry vision, double vision, or other vision problems) '[ ]'$ Changes in mood or behavior (ex: decreased sex drive, irritability, or forgetfulness) '[ ]'$ Starting new medications (ex: steroids, other medications that lower immune response) [X ]Patient is not experiencing any of the general symptoms above.   Gastrointestinal  Patient is having 3 bowel movements each day.  Is this different from baseline? '[ ]'$ Yes [ X]No Are your stools watery or do they have a foul smell? '[ ]'$ Yes [X ]No Have you seen blood in your stools? '[ ]'$ Yes '[X]'$ No Are your stools dark, tarry, or sticky? '[ ]'$ Yes [X ]No Are you having pain or tenderness in your belly? [X ]Yes '[ ]'$ No  (MD aware)  Skin Does your skin itch? '[ ]'$ Yes '[X]'$ No Do you have a rash? '[ ]'$ Yes '[X]'$ No Has your skin blistered and/or peeled? '[ ]'$ Yes '[X]'$ No Do you have sores in your mouth? '[ ]'$ Yes '[X]'$ No  Hepatic Has your urine been dark or tea colored? '[ ]'$ Yes '[X]'$ No Have you noticed that your skin or the whites of your eyes are turning yellow? '[ ]'$ Yes '[X]'$ No Are you bleeding or bruising more easily than normal? '[ ]'$ Yes '[X]'$ No Are you nauseous and/or vomiting? '[ ]'$ Yes '[X]'$ No Do you have pain on the right side of your stomach? '[ ]'$ Yes '[X]'$ No  Neurologic  Are you having unusual weakness of legs, arms, or face? '[ ]'$ Yes '[X]'$ No Are you having numbness or tingling in your hands or feet? '[ ]'$ Yes '[X]'$ No  Elpidio Anis

## 2022-02-04 ENCOUNTER — Ambulatory Visit (HOSPITAL_COMMUNITY)
Admission: RE | Admit: 2022-02-04 | Discharge: 2022-02-04 | Disposition: A | Discharge status: Home / Self Care | Payer: Commercial Managed Care - PPO | Source: Ambulatory Visit | Attending: Hematology | Admitting: Hematology

## 2022-02-04 ENCOUNTER — Other Ambulatory Visit: Payer: Self-pay

## 2022-02-04 ENCOUNTER — Other Ambulatory Visit (HOSPITAL_COMMUNITY): Payer: Self-pay | Admitting: Hematology

## 2022-02-04 ENCOUNTER — Encounter (HOSPITAL_COMMUNITY): Payer: Self-pay

## 2022-02-04 DIAGNOSIS — C7951 Secondary malignant neoplasm of bone: Secondary | ICD-10-CM | POA: Diagnosis present

## 2022-02-04 DIAGNOSIS — C649 Malignant neoplasm of unspecified kidney, except renal pelvis: Secondary | ICD-10-CM | POA: Diagnosis present

## 2022-02-04 NOTE — Progress Notes (Signed)
CT called stating that patient has a severe Iodine allergy and recommends CT CAP WO due to his symptoms in past being feeling like throat closing up.

## 2022-02-10 ENCOUNTER — Inpatient Hospital Stay: Payer: Commercial Managed Care - PPO

## 2022-02-10 ENCOUNTER — Inpatient Hospital Stay (HOSPITAL_BASED_OUTPATIENT_CLINIC_OR_DEPARTMENT_OTHER): Payer: Commercial Managed Care - PPO | Admitting: Hematology

## 2022-02-10 VITALS — BP 122/82 | HR 58 | Temp 97.5°F | Resp 18

## 2022-02-10 VITALS — BP 126/83 | HR 55 | Temp 97.5°F | Resp 18 | Ht 70.0 in | Wt 166.5 lb

## 2022-02-10 DIAGNOSIS — C649 Malignant neoplasm of unspecified kidney, except renal pelvis: Secondary | ICD-10-CM

## 2022-02-10 DIAGNOSIS — N2889 Other specified disorders of kidney and ureter: Secondary | ICD-10-CM

## 2022-02-10 DIAGNOSIS — M899 Disorder of bone, unspecified: Secondary | ICD-10-CM

## 2022-02-10 DIAGNOSIS — Z95828 Presence of other vascular implants and grafts: Secondary | ICD-10-CM

## 2022-02-10 DIAGNOSIS — Z5112 Encounter for antineoplastic immunotherapy: Secondary | ICD-10-CM | POA: Diagnosis not present

## 2022-02-10 DIAGNOSIS — C7951 Secondary malignant neoplasm of bone: Secondary | ICD-10-CM

## 2022-02-10 LAB — CBC WITH DIFFERENTIAL/PLATELET
Abs Immature Granulocytes: 0.03 10*3/uL (ref 0.00–0.07)
Basophils Absolute: 0.1 10*3/uL (ref 0.0–0.1)
Basophils Relative: 1 %
Eosinophils Absolute: 0.4 10*3/uL (ref 0.0–0.5)
Eosinophils Relative: 5 %
HCT: 44 % (ref 39.0–52.0)
Hemoglobin: 14.5 g/dL (ref 13.0–17.0)
Immature Granulocytes: 0 %
Lymphocytes Relative: 25 %
Lymphs Abs: 2.3 10*3/uL (ref 0.7–4.0)
MCH: 30 pg (ref 26.0–34.0)
MCHC: 33 g/dL (ref 30.0–36.0)
MCV: 91.1 fL (ref 80.0–100.0)
Monocytes Absolute: 0.7 10*3/uL (ref 0.1–1.0)
Monocytes Relative: 8 %
Neutro Abs: 5.7 10*3/uL (ref 1.7–7.7)
Neutrophils Relative %: 61 %
Platelets: 189 10*3/uL (ref 150–400)
RBC: 4.83 MIL/uL (ref 4.22–5.81)
RDW: 14.8 % (ref 11.5–15.5)
WBC: 9.2 10*3/uL (ref 4.0–10.5)
nRBC: 0 % (ref 0.0–0.2)

## 2022-02-10 LAB — COMPREHENSIVE METABOLIC PANEL
ALT: 19 U/L (ref 0–44)
AST: 18 U/L (ref 15–41)
Albumin: 4 g/dL (ref 3.5–5.0)
Alkaline Phosphatase: 86 U/L (ref 38–126)
Anion gap: 6 (ref 5–15)
BUN: 18 mg/dL (ref 6–20)
CO2: 27 mmol/L (ref 22–32)
Calcium: 9 mg/dL (ref 8.9–10.3)
Chloride: 106 mmol/L (ref 98–111)
Creatinine, Ser: 0.79 mg/dL (ref 0.61–1.24)
GFR, Estimated: >60 mL/min (ref >60)
Glucose, Bld: 120 mg/dL — ABNORMAL HIGH (ref 70–99)
Potassium: 4 mmol/L (ref 3.5–5.1)
Sodium: 139 mmol/L (ref 135–145)
Total Bilirubin: 0.6 mg/dL (ref 0.3–1.2)
Total Protein: 7.4 g/dL (ref 6.5–8.1)

## 2022-02-10 LAB — TSH: TSH: 0.652 u[IU]/mL (ref 0.350–4.500)

## 2022-02-10 LAB — MAGNESIUM: Magnesium: 2.1 mg/dL (ref 1.7–2.4)

## 2022-02-10 MED ORDER — SODIUM CHLORIDE 0.9 % IV SOLN: Freq: Once | INTRAVENOUS | Status: AC

## 2022-02-10 MED ORDER — SODIUM CHLORIDE 0.9% FLUSH
10.0000 mL | INTRAVENOUS | Status: DC | PRN
  Administered 2022-02-10: 10 mL

## 2022-02-10 MED ORDER — PREDNISONE 50 MG PO TABS: ORAL_TABLET | ORAL | 0 refills | Status: DC

## 2022-02-10 MED ORDER — SODIUM CHLORIDE 0.9 % IV SOLN
480.0000 mg | Freq: Once | INTRAVENOUS | Status: AC
  Administered 2022-02-10: 480 mg via INTRAVENOUS
  Filled 2022-02-10: qty 48

## 2022-02-10 MED ORDER — HEPARIN SOD (PORK) LOCK FLUSH 100 UNIT/ML IV SOLN
500.0000 [IU] | Freq: Once | INTRAVENOUS | Status: AC | PRN
  Administered 2022-02-10: 500 [IU]

## 2022-02-10 NOTE — Progress Notes (Signed)
Patient presents today for treatment and follow up visit with Dr. Delton Coombes. Labs reviewed by MD.  Message received from A. Ouida Sills RN / Dr. Delton Coombes patient ready for treatment. Labs within parameters for today's treatment. Vital signs within parameters for treatment. Patient has no complaints of any changes since his last treatment.

## 2022-02-10 NOTE — Progress Notes (Signed)
[place], [place] [id number]   CLINIC:  Medical Oncology/Hematology  PCP:  [name], [place] / [name] [address] [phone]   REASON FOR VISIT:  Follow-up for metastatic kidney cancer  PRIOR THERAPY: none  NGS Results: not done  CURRENT THERAPY: Nivolumab + Ipilimumab q21d / Nivolumab q28d  BRIEF ONCOLOGIC HISTORY:  Oncology History  Metastatic renal cell carcinoma to bone ([facility])  11/04/2021 Initial Diagnosis   Metastatic renal cell carcinoma to bone ([facility])   11/17/2021 -  Chemotherapy   Patient is on Treatment Plan : RENAL CELL CARCINOMA Nivolumab + Ipilimumab q21d / Nivolumab q28d       CANCER STAGING:  Cancer Staging  Metastatic renal cell carcinoma to bone [facility]) Staging form: Kidney, AJCC 8th Edition - Clinical stage from 11/04/2021: Stage IV (cT1b, cN1, pM1) - Unsigned   INTERVAL HISTORY:  Jacob Reynolds, a 56 y.o. male, is here for metastatic renal cell carcinoma to the bones and toxicity assessment for immunotherapy.  He has completed 4 cycles of combination immunotherapy.  He did not have any diarrhea, dry cough, skin rashes.  He reports tailbone pain which lasted few days and improved.  He had CT scans and blood work done.   REVIEW OF SYSTEMS:  Review of Systems  Constitutional:  Negative for appetite change and fatigue.  Cardiovascular:  Negative for chest pain and leg swelling.  Neurological:  Positive for headaches.  All other systems reviewed and are negative.   PAST MEDICAL/SURGICAL HISTORY:  Past Medical History:  Diagnosis Date   Plantar fasciitis    Port-A-Cath in place 11/10/2021   Past Surgical History:  Procedure Laterality Date   IR IMAGING GUIDED PORT INSERTION  10/15/2021    SOCIAL HISTORY:  Social History   Socioeconomic History   Marital status: Single    Spouse name: Not on file   Number of children: 1   Years of education: 11   Highest education level: 11th grade   Occupational History   Not on file  Tobacco Use   Smoking status: Every Day    Packs/day: 1.00    Years: 36.00    Total pack years: 36.00    Types: Cigarettes    Start date: 06/13/1978   Smokeless tobacco: Never  Substance and Sexual Activity   Alcohol use: Yes    Alcohol/week: 7.0 standard drinks of alcohol    Types: 7 Cans of beer per week   Drug use: Yes    Frequency: 1.0 times per week    Types: Marijuana   Sexual activity: Not on file  Other Topics Concern   Not on file  Social History Narrative   Not on file   Social Determinants of Health   Financial Resource Strain: Not on file  Food Insecurity: Not on file  Transportation Needs: Not on file  Physical Activity: Not on file  Stress: Not on file  Social Connections: Not on file  Intimate Partner Violence: Not on file    FAMILY HISTORY:  Family History  Problem Relation Age of Onset   Hypertension Mother    Parkinson's disease Father     CURRENT MEDICATIONS:  Current Outpatient Medications  Medication Sig Dispense Refill   ciclopirox (PENLAC) 8 % solution Apply topically at bedtime. Apply over nail and surrounding skin. Apply daily over previous coat. After seven (7) days, may remove with alcohol and continue cycle. 6.6 mL 0   cyclobenzaprine (FLEXERIL)  10 MG tablet Take 1 tablet (10 mg total) by mouth 3 (three) times daily as needed for muscle spasms. 90 tablet 1   Ipilimumab (YERVOY IV) Inject into the vein every 21 ( twenty-one) days. Every 21 days x 4 cycles then d/c     meloxicam (MOBIC) 15 MG tablet Take 1 tablet (15 mg total) by mouth daily as needed for pain. 30 tablet 0   Nivolumab (OPDIVO IV) Inject into the vein every 21 ( twenty-one) days. Every 21 days x 4 cycles, then every 28 days     predniSONE (DELTASONE) 50 MG tablet Take one tablet by mouth 13 hours, 7 hours, and 1 hour prior prior to CT scan 3 tablet 0   lidocaine-prilocaine (EMLA) cream Apply a small amount to port a cath site and cover with  plastic wrap 1 hour prior to infusion appointments (Patient not taking: Reported on 02/10/2022) 30 g 3   No current facility-administered medications for this visit.   Facility-Administered Medications Ordered in Other Visits  Medication Dose Route Frequency Provider Last Rate Last Admin   diphenhydrAMINE (BENADRYL) 50 MG/ML injection            famotidine (PEPCID) 20-0.9 MG/50ML-% IVPB            heparin lock flush 100 unit/mL  500 Units Intracatheter Once PRN Derek Jack, MD       nivolumab (OPDIVO) 480 mg in sodium chloride 0.9 % 100 mL chemo infusion  480 mg Intravenous Once Derek Jack, MD       sodium chloride flush (NS) 0.9 % injection 10 mL  10 mL Intracatheter PRN Derek Jack, MD        ALLERGIES:  Allergies  Allergen Reactions   Iodine     Throat felt like closed Was in hospital for allergy and almost died- per patient     PHYSICAL EXAM:  Performance status (ECOG): 0 - Asymptomatic  Vitals:   02/10/22 1309  BP: 126/83  Pulse: (!) 55  Resp: 18  Temp: (!) 97.5 F (36.4 C)  SpO2: 97%   Wt Readings from Last 3 Encounters:  02/10/22 166 lb 8 oz (75.5 kg)  01/19/22 165 lb 6.4 oz (75 kg)  12/29/21 164 lb (74.4 kg)   Physical Exam Vitals reviewed.  Constitutional:      Appearance: Normal appearance.  Cardiovascular:     Rate and Rhythm: Normal rate and regular rhythm.     Pulses: Normal pulses.     Heart sounds: Normal heart sounds.  Pulmonary:     Effort: Pulmonary effort is normal.     Breath sounds: Normal breath sounds.  Neurological:     General: No focal deficit present.     Mental Status: He is alert and oriented to person, place, and time.  Psychiatric:        Mood and Affect: Mood normal.        Behavior: Behavior normal.     LABORATORY DATA:  I have reviewed the labs as listed.     Latest Ref Rng & Units 02/10/2022   11:47 AM 01/19/2022   11:29 AM 12/29/2021   11:44 AM  CBC  WBC 4.0 - 10.5 K/uL 9.2  9.8  11.3    Hemoglobin 13.0 - 17.0 g/dL 14.5  14.0  13.9   Hematocrit 39.0 - 52.0 % 44.0  42.8  42.3   Platelets 150 - 400 K/uL 189  171  218       Latest Ref Rng &  Units 02/10/2022   11:47 AM 01/19/2022   11:29 AM 12/29/2021   11:44 AM  CMP  Glucose 70 - 99 mg/dL 120  81  88   BUN 6 - 20 mg/dL 18  22  23    Creatinine 0.61 - 1.24 mg/dL 0.79  0.97  0.87   Sodium 135 - 145 mmol/L 139  139  139   Potassium 3.5 - 5.1 mmol/L 4.0  4.3  4.3   Chloride 98 - 111 mmol/L 106  107  106   CO2 22 - 32 mmol/L 27  26  27    Calcium 8.9 - 10.3 mg/dL 9.0  9.0  9.1   Total Protein 6.5 - 8.1 g/dL 7.4  7.2  7.5   Total Bilirubin 0.3 - 1.2 mg/dL 0.6  0.5  0.4   Alkaline Phos 38 - 126 U/L 86  83  89   AST 15 - 41 U/L 18  17  17    ALT 0 - 44 U/L 19  19  24      DIAGNOSTIC IMAGING:  I have independently reviewed the scans and discussed with the patient. CT CHEST ABDOMEN PELVIS WO CONTRAST  Result Date: 02/06/2022 CLINICAL DATA:  Renal cell carcinoma. Restaging. * Tracking Code: BO * EXAM: CT CHEST, ABDOMEN AND PELVIS WITHOUT CONTRAST TECHNIQUE: Multidetector CT imaging of the chest, abdomen and pelvis was performed following the standard protocol without IV contrast. RADIATION DOSE REDUCTION: This exam was performed according to the departmental dose-optimization program which includes automated exposure control, adjustment of the mA and/or kV according to patient size and/or use of iterative reconstruction technique. COMPARISON:  Multiple prior imaging studies. Most is a PET-CT from 10/07/2021 FINDINGS: CT CHEST FINDINGS Cardiovascular: The heart is normal in size. No pericardial effusion. Stable scattered aortic and coronary artery calcifications. The right-sided Port-A-Cath is in good position without complicating features. Mediastinum/Nodes: No mediastinal or hilar mass or adenopathy. Esophagus is grossly normal. There is a stable 11 mm right thyroid nodule. Not clinically significant; no follow-up imaging recommended (ref:  J Am Coll Radiol. 2015 Feb;12(2): 143-50). Lungs/Pleura: Stable emphysematous changes and pulmonary scarring. No pulmonary nodules to suggest pulmonary metastatic disease. No pleural effusions or pleural nodules. Musculoskeletal: No chest wall mass, supraclavicular or axillary adenopathy. The was a small mildly hypermetabolic left supraclavicular node on the prior PET-CT but this measures 3 mm. The right medial clavicle lesion may be slightly more osteosclerotic. The T8 lesion appears more lytic with some surrounding sclerosis. No new bone lesions. CT ABDOMEN PELVIS FINDINGS Hepatobiliary: No hepatic lesions are identified without contrast. No intrahepatic biliary dilatation. The gallbladder is unremarkable. No common bile duct dilatation. Pancreas: No mass, inflammation or ductal dilatation. Spleen: Normal size. No focal lesions. Adrenals/Urinary Tract: Stable small left adrenal gland nodule measuring 8.5 mm. This did not show any significant loss of signal intensity on the MRI and show slight hypermetabolism on the PET-CT. This is most likely a small metastasis. Attention on future scans is suggested. The large left renal mass is difficult to assess on this noncontrast CT scan. The right kidney is unremarkable. Stomach/Bowel: The stomach, duodenum, small bowel and colon are grossly normal without oral contrast. No inflammatory changes, mass lesions or obstructive findings. The appendix is normal. Vascular/Lymphatic: Age advanced atherosclerotic calcifications involving the aorta and iliac arteries but no aneurysm. Stable retroperitoneal lymphadenopathy. Cluster of lymph nodes posterior to the left renal vein on image 73/2 measures approximately 2.3 x 1.9 cm and is unchanged. 11 mm inter aortocaval lymph  node is stable. No new or progressive findings. Reproductive: The prostate gland and seminal vesicles are. Other: No pelvic mass or adenopathy. No free pelvic fluid collections. No inguinal mass or adenopathy. No  abdominal wall hernia or subcutaneous lesions. Musculoskeletal: The L3 lesion is stable in size. This is largely lytic with a sclerotic rim. Multiple sacral lesions are largely lytic and appear stable. Cystic areas in the femoral head neck junctions bilaterally a benign finding. IMPRESSION: 1. The large left renal mass is difficult to assess on this noncontrast CT scan. 2. Stable retroperitoneal lymphadenopathy. No new or progressive findings. 3. Stable emphysematous changes and pulmonary scarring but no findings for pulmonary metastatic disease. 4. Stable retroperitoneal lymphadenopathy. 5. Overall stable appearing osseous metastatic disease as detailed above. 6. Stable small left adrenal gland nodule, indeterminate. Attention on future scans is suggested. 7. Age advanced atherosclerotic calcifications involving the aorta and iliac arteries. Aortic Atherosclerosis (ICD10-I70.0) and Emphysema (ICD10-J43.9). Electronically Signed   By: Marijo Sanes M.D.   On: 02/06/2022 10:29     ASSESSMENT:  Metastatic kidney cancer to the bones: - CT chest lung cancer screening scan (08/31/2021): Lung RADS 1S.  Possible soft tissue fullness in the upper pole of the left kidney. - US renal (09/15/2021): Mass with solid component in the upper pole of the left kidney measuring 6.1 cm, highly suspicious for RCC. - MRI of the abdomen (09/22/2021): 6.5 cm left kidney upper pole solid enhancing renal mass favoring RCC.  Scattered enhancing bone lesions suspicious for osseous metastatic disease, largest at L3 and in the central sacrum.  1.3 x 1.4 cm mass of the left adrenal gland has indeterminate characteristics by MRI but on CT chest seems to have a low-density favoring adenoma.  No tumor thrombus in the left renal vein. - PET scan (10/07/2021): Hypermetabolic left kidney mass with SUV 7.1.  Bilateral abdominal retroperitoneal hypermetabolic lymph nodes.  Multifocal bone metastasis.  Central sacrum, inferior aspect of L3 vertebral  body, T8 body, C7 spinous process and medial right clavicle. 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  Biopsy shows a poorly differentiated neoplasm, IHC positive for CK8/18 (subset), CD10 and cytokeratin AE 1/3.  Cells are negative for CK20, S100, HMB45, SOX10, calretinin, P504S, prostein, PSA, desmin and CDX2. - I have talked to our pathologist Dr. Melina Copa who thought it is most likely clear-cell histology although IHC does not support it. - IMDC criteria: 1 risk factor with less than 1 year from time of diagnosis to systemic therapy.  Intermediate risk group - Opdivo and Yervoy cycle 1 started on 11/17/2021. 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  He is a potential candidate for belzutifan on further progression.    Social/family history: - His nephew lives with him.  He has a 68-year-old daughter who was given for adoption.  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  He is a current active smoker, 1 pack/day for close to 40 years.  He drinks 1 beer at night. - Maternal aunt had breast cancer.  Maternal cousin has prostate cancer and another maternal cousin had breast cancer.  Maternal cousin also had kidney removed, thought to be secondary to cancer.  Paternal grandfather had lung cancer.   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 - He did not report any immunotherapy related side effects. - I have recommended genetic testing as NGS testing showed VHL pathogenic variant. - Labs from today reviewed by me shows normal LFTs, CBC and TSH. - I have reviewed CT CAP without contrast (02/04/2022): Stable left renal mass, retroperitoneal adenopathy, bone lesions and left adrenal  nodule. - I have recommended that we will discontinue ipilimumab and continue opdivo every 4 weeks from now on.  I will reevaluate him in 8 weeks for follow-up.  He could not get IV contrast as he has iodine allergy.  We will either have to premedicate him with prednisone.  If its not feasible, will consider repeating PET scan at next time.  2.  Low back pain: - His tailbone pain acted up and lasted few days and improved.       - Continue Mobic and Flexeril as needed.  3.  Bone metastatic disease: - Plan to start denosumab after dental evaluation.   Orders placed this encounter:  No orders of the defined types were placed in this encounter.    Derek Jack, MD [place] [phone]

## 2022-02-10 NOTE — Patient Instructions (Addendum)
[place] at [facility] Discharge Instructions   You were seen and examined today by Dr. Delton Coombes.  He reviewed the results of your lab work which are normal/stable.   He reviewed the results of your CT scan which show the cancer is stable. There are no new areas of cancer and no spread to other areas of the body.   We will proceed with your treatment today.   Return as scheduled.    Thank you for choosing [place] at [facility] to provide your oncology and hematology care.  To afford each patient quality time with our provider, please arrive at least 15 minutes before your scheduled appointment time.   If you have a lab appointment with the [facility] please come in thru the Main Entrance and check in at the main information desk.  You need to re-schedule your appointment should you arrive 10 or more minutes late.  We strive to give you quality time with our providers, and arriving late affects you and other patients whose appointments are after yours.  Also, if you no show three or more times for appointments you may be dismissed from the clinic at the providers discretion.     Again, thank you for choosing [facility].  Our hope is that these requests will decrease the amount of time that you wait before being seen by our physicians.       _____________________________________________________________  Should you have questions after your visit to [facility], please contact our office at [phone] and follow the prompts.  Our office hours are 8:00 a.m. and 4:30 p.m. Monday - Friday.  Please note that voicemails left after 4:00 p.m. may not be returned until the following business day.  We are closed weekends and major holidays.  You do have access to a nurse 24-7, just call the main number to the clinic [phone] and do not press any options, hold on the line and a nurse will answer the phone.    For  prescription refill requests, have your pharmacy contact our office and allow 72 hours.    Due to Covid, you will need to wear a mask upon entering the hospital. If you do not have a mask, a mask will be given to you at the Main Entrance upon arrival. For doctor visits, patients may have 1 support person age 56 or older with them. For treatment visits, patients can not have anyone with them due to social distancing guidelines and our immunocompromised population.

## 2022-02-10 NOTE — Progress Notes (Signed)
Patient has been examined by Dr. Katragadda, and vital signs and labs have been reviewed. ANC, Creatinine, LFTs, hemoglobin, and platelets are within treatment parameters per M.D. - pt may proceed with treatment.  Primary RN and pharmacy notified.  

## 2022-02-10 NOTE — Progress Notes (Signed)
Patient tolerated therapy with no complaints voiced.  Side effects with management reviewed with understanding verbalized.  Port site clean and dry with no bruising or swelling noted at site.  Good blood return noted before and after administration of therapy.  Band aid applied.  Patient left in satisfactory condition with VSS and no s/s of distress noted.  

## 2022-02-10 NOTE — Patient Instructions (Signed)
[facility]  Discharge Instructions: Thank you for choosing [facility] to provide your oncology and hematology care.  If you have a lab appointment with the [facility], please come in thru the Main Entrance and check in at the main information desk.  Wear comfortable clothing and clothing appropriate for easy access to any Portacath or PICC line.   We strive to give you quality time with your provider. You may need to reschedule your appointment if you arrive late (15 or more minutes).  Arriving late affects you and other patients whose appointments are after yours.  Also, if you miss three or more appointments without notifying the office, you may be dismissed from the clinic at the provider's discretion.      For prescription refill requests, have your pharmacy contact our office and allow 72 hours for refills to be completed.    Today you received the following chemotherapy and/or immunotherapy agents opdivo Nivolumab Injection What is this medication? NIVOLUMAB (nye VOL ue mab) treats some types of cancer. It works by helping your immune system slow or stop the spread of cancer cells. It is a monoclonal antibody. This medicine may be used for other purposes; ask your health care provider or pharmacist if you have questions. COMMON BRAND NAME(S): Opdivo What should I tell my care team before I take this medication? They need to know if you have any of these conditions: Allogeneic stem cell transplant (uses someone else's stem cells) Autoimmune diseases, such as Crohn disease, ulcerative colitis, lupus History of chest radiation Nervous system problems, such as Guillain-Barre syndrome or myasthenia gravis Organ transplant An unusual or allergic reaction to nivolumab, other medications, foods, dyes, or preservatives Pregnant or trying to get pregnant Breast-feeding How should I use this medication? This medication is infused into a vein. It is given  in a hospital or clinic setting. A special MedGuide will be given to you before each treatment. Be sure to read this information carefully each time. Talk to your care team about the use of this medication in children. While it may be prescribed for children as young as 12 years for selected conditions, precautions do apply. Overdosage: If you think you have taken too much of this medicine contact a poison control center or emergency room at once. NOTE: This medicine is only for you. Do not share this medicine with others. What if I miss a dose? Keep appointments for follow-up doses. It is important not to miss your dose. Call your care team if you are unable to keep an appointment. What may interact with this medication? Interactions have not been studied. This list may not describe all possible interactions. Give your health care provider a list of all the medicines, herbs, non-prescription drugs, or dietary supplements you use. Also tell them if you smoke, drink alcohol, or use illegal drugs. Some items may interact with your medicine. What should I watch for while using this medication? Your condition will be monitored carefully while you are receiving this medication. You may need blood work while taking this medication. This medication may cause serious skin reactions. They can happen weeks to months after starting the medication. Contact your care team right away if you notice fevers or flu-like symptoms with a rash. The rash may be red or purple and then turn into blisters or peeling of the skin. You may also notice a red rash with swelling of the face, lips, or lymph nodes in your neck or  under your arms. Tell your care team right away if you have any change in your eyesight. Talk to your care team if you are pregnant or think you might be pregnant. A negative pregnancy test is required before starting this medication. A reliable form of contraception is recommended while taking this  medication and for 5 months after the last dose. Talk to your care team about effective forms of contraception. Do not breast-feed while taking this medication and for 5 months after the last dose. What side effects may I notice from receiving this medication? Side effects that you should report to your care team as soon as possible: Allergic reactions--skin rash, itching, hives, swelling of the face, lips, tongue, or throat Dry cough, shortness of breath or trouble breathing Eye pain, redness, irritation, or discharge with blurry or decreased vision Heart muscle inflammation--unusual weakness or fatigue, shortness of breath, chest pain, fast or irregular heartbeat, dizziness, swelling of the ankles, feet, or hands Hormone gland problems--headache, sensitivity to light, unusual weakness or fatigue, dizziness, fast or irregular heartbeat, increased sensitivity to cold or heat, excessive sweating, constipation, hair loss, increased thirst or amount of urine, tremors or shaking, irritability Infusion reactions--chest pain, shortness of breath or trouble breathing, feeling faint or lightheaded Kidney injury (glomerulonephritis)--decrease in the amount of urine, red or dark brown urine, foamy or bubbly urine, swelling of the ankles, hands, or feet Liver injury--right upper belly pain, loss of appetite, nausea, light-colored stool, dark yellow or brown urine, yellowing skin or eyes, unusual weakness or fatigue Pain, tingling, or numbness in the hands or feet, muscle weakness, change in vision, confusion or trouble speaking, loss of balance or coordination, trouble walking, seizures Rash, fever, and swollen lymph nodes Redness, blistering, peeling, or loosening of the skin, including inside the mouth Sudden or severe stomach pain, bloody diarrhea, fever, nausea, vomiting Side effects that usually do not require medical attention (report these to your care team if they continue or are bothersome): Bone,  joint, or muscle pain Diarrhea Fatigue Loss of appetite Nausea Skin rash This list may not describe all possible side effects. Call your doctor for medical advice about side effects. You may report side effects to FDA at 1-800-FDA-1088. Where should I keep my medication? This medication is given in a hospital or clinic. It will not be stored at home. NOTE: This sheet is a summary. It may not cover all possible information. If you have questions about this medicine, talk to your doctor, pharmacist, or health care provider.  2023 Elsevier/Gold Standard (2021-04-30 00:00:00)       To help prevent nausea and vomiting after your treatment, we encourage you to take your nausea medication as directed.  BELOW ARE SYMPTOMS THAT SHOULD BE REPORTED IMMEDIATELY: *FEVER GREATER THAN 100.4 F (38 C) OR HIGHER *CHILLS OR SWEATING *NAUSEA AND VOMITING THAT IS NOT CONTROLLED WITH YOUR NAUSEA MEDICATION *UNUSUAL SHORTNESS OF BREATH *UNUSUAL BRUISING OR BLEEDING *URINARY PROBLEMS (pain or burning when urinating, or frequent urination) *BOWEL PROBLEMS (unusual diarrhea, constipation, pain near the anus) TENDERNESS IN MOUTH AND THROAT WITH OR WITHOUT PRESENCE OF ULCERS (sore throat, sores in mouth, or a toothache) UNUSUAL RASH, SWELLING OR PAIN  UNUSUAL VAGINAL DISCHARGE OR ITCHING   Items with * indicate a potential emergency and should be followed up as soon as possible or go to the Emergency Department if any problems should occur.  Please show the CHEMOTHERAPY ALERT CARD or IMMUNOTHERAPY ALERT CARD at check-in to the Emergency Department and triage nurse.  Should you have questions after your visit or need to cancel or reschedule your appointment, please contact [facility] [phone]  and follow the prompts.  Office hours are 8:00 a.m. to 4:30 p.m. Monday - Friday. Please note that voicemails left after 4:00 p.m. may not be returned until the following business day.  We are  closed weekends and major holidays. You have access to a nurse at all times for urgent questions. Please call the main number to the clinic [phone] and follow the prompts.  For any non-urgent questions, you may also contact your provider using MyChart. We now offer e-Visits for anyone 44 and older to request care online for non-urgent symptoms. For details visit [email or web address].   Also download the MyChart app! Go to the app store, search "MyChart", open the app, select [facility], and log in with your MyChart username and password.  Masks are optional in the cancer centers. If you would like for your care team to wear a mask while they are taking care of you, please let them know. You may have one support person who is at least 56 years old accompany you for your appointments.

## 2022-02-12 ENCOUNTER — Other Ambulatory Visit: Payer: Self-pay | Admitting: Hematology

## 2022-02-12 DIAGNOSIS — Z95828 Presence of other vascular implants and grafts: Secondary | ICD-10-CM

## 2022-02-12 DIAGNOSIS — C649 Malignant neoplasm of unspecified kidney, except renal pelvis: Secondary | ICD-10-CM

## 2022-02-24 ENCOUNTER — Inpatient Hospital Stay: Payer: Commercial Managed Care - PPO

## 2022-02-24 ENCOUNTER — Encounter: Payer: Self-pay | Admitting: Licensed Clinical Social Worker

## 2022-02-24 ENCOUNTER — Inpatient Hospital Stay: Payer: Commercial Managed Care - PPO | Attending: Hematology | Admitting: Licensed Clinical Social Worker

## 2022-02-24 DIAGNOSIS — Z801 Family history of malignant neoplasm of trachea, bronchus and lung: Secondary | ICD-10-CM

## 2022-02-24 DIAGNOSIS — Z803 Family history of malignant neoplasm of breast: Secondary | ICD-10-CM

## 2022-02-24 DIAGNOSIS — C7951 Secondary malignant neoplasm of bone: Secondary | ICD-10-CM | POA: Insufficient documentation

## 2022-02-24 DIAGNOSIS — C649 Malignant neoplasm of unspecified kidney, except renal pelvis: Secondary | ICD-10-CM

## 2022-02-24 DIAGNOSIS — Z5112 Encounter for antineoplastic immunotherapy: Secondary | ICD-10-CM | POA: Insufficient documentation

## 2022-02-24 DIAGNOSIS — C642 Malignant neoplasm of left kidney, except renal pelvis: Secondary | ICD-10-CM | POA: Insufficient documentation

## 2022-02-24 DIAGNOSIS — Z8042 Family history of malignant neoplasm of prostate: Secondary | ICD-10-CM

## 2022-02-24 DIAGNOSIS — Z79899 Other long term (current) drug therapy: Secondary | ICD-10-CM | POA: Insufficient documentation

## 2022-02-24 LAB — GENETIC SCREENING ORDER

## 2022-02-24 NOTE — Progress Notes (Signed)
REFERRING PROVIDER: Katragadda, Sreedhar, MD [address] [place],  [place] [address]  PRIMARY PROVIDER:  Morgan, Tiffany M, FNP  PRIMARY REASON FOR VISIT:  1. Metastatic renal cell carcinoma to bone (HCC)   2. Family history of breast cancer   3. Family history of prostate cancer      HISTORY OF PRESENT ILLNESS:   Mr. Casasola, a 55 y.o. male, was seen for a [facility] cancer genetics consultation at the request of Dr. Katragadda due to a personal and family history of cancer.  Mr. Qin presents to clinic today to discuss the possibility of a hereditary predisposition to cancer, genetic testing, and to further clarify his future cancer risks, as well as potential cancer risks for family members.    CANCER HISTORY:  Oncology History  Metastatic renal cell carcinoma to bone (HCC)  11/04/2021 Initial Diagnosis   Metastatic renal cell carcinoma to bone (HCC)   11/17/2021 - 02/10/2022 Chemotherapy   Patient is on Treatment Plan : RENAL CELL CARCINOMA Nivolumab + Ipilimumab q21d / Nivolumab q28d     11/17/2021 -  Chemotherapy   Patient is on Treatment Plan : RENAL CELL CARCINOMA Nivolumab (3) + Ipilimumab (1) q21d x 4 cycles  / Nivolumab (480) q28d      In 2023, at the age of 55, Mr. Worthington was diagnosed with metastatic renal cell carcinoma. The treatment plan includes chemotherapy. 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    Past Medical History:  Diagnosis Date   Plantar fasciitis    Port-A-Cath in place 11/10/2021    Past Surgical History:  Procedure Laterality Date   IR IMAGING GUIDED PORT INSERTION  10/15/2021    FAMILY HISTORY:  We obtained a detailed, 4-generation family history.  Significant diagnoses are listed below: Family History  Problem Relation Age of Onset   Hypertension Mother    Parkinson's disease Father    Breast cancer Maternal Aunt        dx >50   Lung cancer Paternal Grandfather    Breast cancer Cousin        dx 50s   Prostate  cancer Cousin        dx 50s   Mr. Ontiveros has 1 daughter, age 2. He has 1 maternal half sister, and 1 paternal half sister who he has never met. Neither have had cancer that he is aware of.  Mr. Marszalek's mother died at 83. Patient has 2 maternal aunts. One aunt had breast cancer diagnosed over age 50 and is living in her 70s. Her son had prostate cancer and her daughter had breast cancer, both in their 50s. Maternal grandparents passed in their 70s.  Mr. Kruck's father passed in his 60s of Parkinson's. Paternal grandfather had lung cancer and passed in his 70s-80s. No other known cancers on this side of the family.  Mr. Killmer is unaware of previous family history of genetic testing for hereditary cancer risks. There is no reported Ashkenazi Jewish ancestry. There is no known consanguinity.    GENETIC COUNSELING ASSESSMENT: Mr. Luginbill is a 55 y.o. male with a personal and family history which is somewhat suggestive of a hereditary cancer syndrome and predisposition to cancer. We, therefore, discussed and recommended the following at today's visit.   DISCUSSION: We discussed that approximately 10% of cancer is hereditary. We discussed VHL briefly given that his tumor testing showed a mutation in this gene. We also discussed BRCA1/BRCA2 given the prostate and   breast cancer on his maternal side. There are other genes we can test related to kidney/breast/prostate cancer as well.Cancers and risks are gene specific. We discussed that testing is beneficial for several reasons including knowing about cancer risks, identifying potential screening and risk-reduction options that may be appropriate, and to understand if other family members could be at risk for cancer and allow them to undergo genetic testing.   We reviewed the characteristics, features and inheritance patterns of hereditary cancer syndromes. We also discussed genetic testing, including the appropriate family members to test, the process of testing,  insurance coverage and turn-around-time for results. We discussed the implications of a negative, positive and/or variant of uncertain significant result. We recommended Mr. Slemmer pursue genetic testing for the [facility] Multi-Cancer+RNA gene panel.   Based on Mr. Kier personal and family history of cancer, he meets medical criteria for genetic testing. Despite that he meets criteria, he may still have an out of pocket cost. We discussed that if his out of pocket cost for testing is over $100, the laboratory will call and confirm whether he wants to proceed with testing.  If the out of pocket cost of testing is less than $100 he will be billed by the genetic testing laboratory.   PLAN: After considering the risks, benefits, and limitations, Mr. Saulters provided informed consent to pursue genetic testing and the blood sample was sent to [facility] for analysis of the Multi-Cancer+RNA panel. Results should be available within approximately 2-3 weeks' time, at which point they will be disclosed by telephone to Mr. Forgette, as will any additional recommendations warranted by these results. Mr. Romanoski will receive a summary of his genetic counseling visit and a copy of his results once available. This information will also be available in Epic.   Mr. Cupps questions were answered to his satisfaction today. Our contact information was provided should additional questions or concerns arise. Thank you for the referral and allowing [place] to share in the care of your patient.   Faith Rogue, MS, [facility] Genetic Counselor [email or web address] Phone: [phone]  The patient was seen for a total of 25 minutes in virtual genetic counseling.  Dr. Grayland Ormond was available for discussion regarding this case.   _______________________________________________________________________ For Office Staff:  Number of people involved in session: 1 Was an Intern/ student involved with case: no

## 2022-03-11 ENCOUNTER — Inpatient Hospital Stay: Payer: Commercial Managed Care - PPO

## 2022-03-11 VITALS — BP 123/77 | HR 55 | Temp 97.7°F | Resp 18

## 2022-03-11 DIAGNOSIS — C642 Malignant neoplasm of left kidney, except renal pelvis: Secondary | ICD-10-CM | POA: Diagnosis not present

## 2022-03-11 DIAGNOSIS — Z5112 Encounter for antineoplastic immunotherapy: Secondary | ICD-10-CM | POA: Diagnosis present

## 2022-03-11 DIAGNOSIS — C649 Malignant neoplasm of unspecified kidney, except renal pelvis: Secondary | ICD-10-CM

## 2022-03-11 DIAGNOSIS — M899 Disorder of bone, unspecified: Secondary | ICD-10-CM

## 2022-03-11 DIAGNOSIS — Z79899 Other long term (current) drug therapy: Secondary | ICD-10-CM | POA: Diagnosis not present

## 2022-03-11 DIAGNOSIS — Z95828 Presence of other vascular implants and grafts: Secondary | ICD-10-CM

## 2022-03-11 DIAGNOSIS — C7951 Secondary malignant neoplasm of bone: Secondary | ICD-10-CM | POA: Diagnosis not present

## 2022-03-11 DIAGNOSIS — N2889 Other specified disorders of kidney and ureter: Secondary | ICD-10-CM

## 2022-03-11 LAB — COMPREHENSIVE METABOLIC PANEL
ALT: 27 U/L (ref 0–44)
AST: 23 U/L (ref 15–41)
Albumin: 3.9 g/dL (ref 3.5–5.0)
Alkaline Phosphatase: 95 U/L (ref 38–126)
Anion gap: 10 (ref 5–15)
BUN: 21 mg/dL — ABNORMAL HIGH (ref 6–20)
CO2: 26 mmol/L (ref 22–32)
Calcium: 9.1 mg/dL (ref 8.9–10.3)
Chloride: 105 mmol/L (ref 98–111)
Creatinine, Ser: 0.93 mg/dL (ref 0.61–1.24)
GFR, Estimated: >60 mL/min (ref >60)
Glucose, Bld: 94 mg/dL (ref 70–99)
Potassium: 4.1 mmol/L (ref 3.5–5.1)
Sodium: 141 mmol/L (ref 135–145)
Total Bilirubin: 0.5 mg/dL (ref 0.3–1.2)
Total Protein: 7.8 g/dL (ref 6.5–8.1)

## 2022-03-11 LAB — CBC WITH DIFFERENTIAL/PLATELET
Abs Immature Granulocytes: 0.01 10*3/uL (ref 0.00–0.07)
Basophils Absolute: 0.1 10*3/uL (ref 0.0–0.1)
Basophils Relative: 1 %
Eosinophils Absolute: 0.4 10*3/uL (ref 0.0–0.5)
Eosinophils Relative: 5 %
HCT: 44.8 % (ref 39.0–52.0)
Hemoglobin: 14.7 g/dL (ref 13.0–17.0)
Immature Granulocytes: 0 %
Lymphocytes Relative: 23 %
Lymphs Abs: 1.9 10*3/uL (ref 0.7–4.0)
MCH: 29.9 pg (ref 26.0–34.0)
MCHC: 32.8 g/dL (ref 30.0–36.0)
MCV: 91.2 fL (ref 80.0–100.0)
Monocytes Absolute: 0.8 10*3/uL (ref 0.1–1.0)
Monocytes Relative: 9 %
Neutro Abs: 5.4 10*3/uL (ref 1.7–7.7)
Neutrophils Relative %: 62 %
Platelets: 204 10*3/uL (ref 150–400)
RBC: 4.91 MIL/uL (ref 4.22–5.81)
RDW: 14.6 % (ref 11.5–15.5)
WBC: 8.5 10*3/uL (ref 4.0–10.5)
nRBC: 0 % (ref 0.0–0.2)

## 2022-03-11 LAB — MAGNESIUM: Magnesium: 2 mg/dL (ref 1.7–2.4)

## 2022-03-11 LAB — TSH: TSH: 0.729 u[IU]/mL (ref 0.350–4.500)

## 2022-03-11 MED ORDER — SODIUM CHLORIDE 0.9% FLUSH
10.0000 mL | INTRAVENOUS | Status: DC | PRN
  Administered 2022-03-11: 10 mL

## 2022-03-11 MED ORDER — SODIUM CHLORIDE 0.9 % IV SOLN
480.0000 mg | Freq: Once | INTRAVENOUS | Status: AC
  Administered 2022-03-11: 480 mg via INTRAVENOUS
  Filled 2022-03-11: qty 48

## 2022-03-11 MED ORDER — SODIUM CHLORIDE 0.9 % IV SOLN: Freq: Once | INTRAVENOUS | Status: AC

## 2022-03-11 MED ORDER — HEPARIN SOD (PORK) LOCK FLUSH 100 UNIT/ML IV SOLN
500.0000 [IU] | Freq: Once | INTRAVENOUS | Status: AC | PRN
  Administered 2022-03-11: 500 [IU]

## 2022-03-11 NOTE — Progress Notes (Signed)
Patient presents today for Opdivo infusion.  Patient is in satisfactory condition with only new complaints of joint pain.  MD made aware.  Vital signs are stable.  Labs reviewed and all labs are within treatment parameters.  We will proceed with treatment per MD orders.   Patient tolerated treatment well with no complaints voiced.  Patient left ambulatory in stable condition.  Vital signs stable at discharge.  Follow up as scheduled.

## 2022-03-11 NOTE — Progress Notes (Signed)
Patients port flushed without difficulty.  Good blood return noted with no bruising or swelling noted at site.  Patient remains accessed for treatment.  

## 2022-03-11 NOTE — Patient Instructions (Signed)
[facility]  Discharge Instructions: Thank you for choosing [facility] to provide your oncology and hematology care.  If you have a lab appointment with the [facility], please come in thru the Main Entrance and check in at the main information desk.  Wear comfortable clothing and clothing appropriate for easy access to any Portacath or PICC line.   We strive to give you quality time with your provider. You may need to reschedule your appointment if you arrive late (15 or more minutes).  Arriving late affects you and other patients whose appointments are after yours.  Also, if you miss three or more appointments without notifying the office, you may be dismissed from the clinic at the provider's discretion.      For prescription refill requests, have your pharmacy contact our office and allow 72 hours for refills to be completed.    Today you received the following chemotherapy and/or immunotherapy agents Opdivo.  Nivolumab Injection What is this medication? NIVOLUMAB (nye VOL ue mab) treats some types of cancer. It works by helping your immune system slow or stop the spread of cancer cells. It is a monoclonal antibody. This medicine may be used for other purposes; ask your health care provider or pharmacist if you have questions. COMMON BRAND NAME(S): Opdivo What should I tell my care team before I take this medication? They need to know if you have any of these conditions: Allogeneic stem cell transplant (uses someone else's stem cells) Autoimmune diseases, such as Crohn disease, ulcerative colitis, lupus History of chest radiation Nervous system problems, such as Guillain-Barre syndrome or myasthenia gravis Organ transplant An unusual or allergic reaction to nivolumab, other medications, foods, dyes, or preservatives Pregnant or trying to get pregnant Breast-feeding How should I use this medication? This medication is infused into a vein. It is  given in a hospital or clinic setting. A special MedGuide will be given to you before each treatment. Be sure to read this information carefully each time. Talk to your care team about the use of this medication in children. While it may be prescribed for children as young as 12 years for selected conditions, precautions do apply. Overdosage: If you think you have taken too much of this medicine contact a poison control center or emergency room at once. NOTE: This medicine is only for you. Do not share this medicine with others. What if I miss a dose? Keep appointments for follow-up doses. It is important not to miss your dose. Call your care team if you are unable to keep an appointment. What may interact with this medication? Interactions have not been studied. This list may not describe all possible interactions. Give your health care provider a list of all the medicines, herbs, non-prescription drugs, or dietary supplements you use. Also tell them if you smoke, drink alcohol, or use illegal drugs. Some items may interact with your medicine. What should I watch for while using this medication? Your condition will be monitored carefully while you are receiving this medication. You may need blood work while taking this medication. This medication may cause serious skin reactions. They can happen weeks to months after starting the medication. Contact your care team right away if you notice fevers or flu-like symptoms with a rash. The rash may be red or purple and then turn into blisters or peeling of the skin. You may also notice a red rash with swelling of the face, lips, or lymph nodes in your neck  or under your arms. Tell your care team right away if you have any change in your eyesight. Talk to your care team if you are pregnant or think you might be pregnant. A negative pregnancy test is required before starting this medication. A reliable form of contraception is recommended while taking this  medication and for 5 months after the last dose. Talk to your care team about effective forms of contraception. Do not breast-feed while taking this medication and for 5 months after the last dose. What side effects may I notice from receiving this medication? Side effects that you should report to your care team as soon as possible: Allergic reactions--skin rash, itching, hives, swelling of the face, lips, tongue, or throat Dry cough, shortness of breath or trouble breathing Eye pain, redness, irritation, or discharge with blurry or decreased vision Heart muscle inflammation--unusual weakness or fatigue, shortness of breath, chest pain, fast or irregular heartbeat, dizziness, swelling of the ankles, feet, or hands Hormone gland problems--headache, sensitivity to light, unusual weakness or fatigue, dizziness, fast or irregular heartbeat, increased sensitivity to cold or heat, excessive sweating, constipation, hair loss, increased thirst or amount of urine, tremors or shaking, irritability Infusion reactions--chest pain, shortness of breath or trouble breathing, feeling faint or lightheaded Kidney injury (glomerulonephritis)--decrease in the amount of urine, red or dark Makai Agostinelli urine, foamy or bubbly urine, swelling of the ankles, hands, or feet Liver injury--right upper belly pain, loss of appetite, nausea, light-colored stool, dark yellow or Lorenda Grecco urine, yellowing skin or eyes, unusual weakness or fatigue Pain, tingling, or numbness in the hands or feet, muscle weakness, change in vision, confusion or trouble speaking, loss of balance or coordination, trouble walking, seizures Rash, fever, and swollen lymph nodes Redness, blistering, peeling, or loosening of the skin, including inside the mouth Sudden or severe stomach pain, bloody diarrhea, fever, nausea, vomiting Side effects that usually do not require medical attention (report these to your care team if they continue or are bothersome): Bone,  joint, or muscle pain Diarrhea Fatigue Loss of appetite Nausea Skin rash This list may not describe all possible side effects. Call your doctor for medical advice about side effects. You may report side effects to FDA at 1-800-FDA-1088. Where should I keep my medication? This medication is given in a hospital or clinic. It will not be stored at home. NOTE: This sheet is a summary. It may not cover all possible information. If you have questions about this medicine, talk to your doctor, pharmacist, or health care provider.  2023 Elsevier/Gold Standard (2021-04-30 00:00:00)        To help prevent nausea and vomiting after your treatment, we encourage you to take your nausea medication as directed.  BELOW ARE SYMPTOMS THAT SHOULD BE REPORTED IMMEDIATELY: *FEVER GREATER THAN 100.4 F (38 C) OR HIGHER *CHILLS OR SWEATING *NAUSEA AND VOMITING THAT IS NOT CONTROLLED WITH YOUR NAUSEA MEDICATION *UNUSUAL SHORTNESS OF BREATH *UNUSUAL BRUISING OR BLEEDING *URINARY PROBLEMS (pain or burning when urinating, or frequent urination) *BOWEL PROBLEMS (unusual diarrhea, constipation, pain near the anus) TENDERNESS IN MOUTH AND THROAT WITH OR WITHOUT PRESENCE OF ULCERS (sore throat, sores in mouth, or a toothache) UNUSUAL RASH, SWELLING OR PAIN  UNUSUAL VAGINAL DISCHARGE OR ITCHING   Items with * indicate a potential emergency and should be followed up as soon as possible or go to the Emergency Department if any problems should occur.  Please show the CHEMOTHERAPY ALERT CARD or IMMUNOTHERAPY ALERT CARD at check-in to the Emergency Department and triage  nurse.  Should you have questions after your visit or need to cancel or reschedule your appointment, please contact [facility] [phone]  and follow the prompts.  Office hours are 8:00 a.m. to 4:30 p.m. Monday - Friday. Please note that voicemails left after 4:00 p.m. may not be returned until the following business day.  We are  closed weekends and major holidays. You have access to a nurse at all times for urgent questions. Please call the main number to the clinic [phone] and follow the prompts.  For any non-urgent questions, you may also contact your provider using MyChart. We now offer e-Visits for anyone 22 and older to request care online for non-urgent symptoms. For details visit [email or web address].   Also download the MyChart app! Go to the app store, search "MyChart", open the app, select [facility], and log in with your MyChart username and password.  Masks are optional in the cancer centers. If you would like for your care team to wear a mask while they are taking care of you, please let them know. You may have one support person who is at least 56 years old accompany you for your appointments.

## 2022-03-16 ENCOUNTER — Encounter: Payer: Self-pay | Admitting: Licensed Clinical Social Worker

## 2022-03-16 ENCOUNTER — Telehealth: Payer: Self-pay | Admitting: Licensed Clinical Social Worker

## 2022-03-16 NOTE — Telephone Encounter (Signed)
I contacted Jacob Reynolds to discuss his genetic testing results. No pathogenic variants were identified in the 84 genes analyzed. Detailed clinic note to follow.   The test report has been scanned into EPIC and is located under the Molecular Pathology section of the Results Review tab.  A portion of the result report is included below for reference.      Faith Rogue, MS, [facility] Genetic Counselor [email or web address] Phone: [phone]

## 2022-04-07 ENCOUNTER — Inpatient Hospital Stay: Payer: Commercial Managed Care - PPO | Attending: Hematology

## 2022-04-07 ENCOUNTER — Inpatient Hospital Stay (HOSPITAL_BASED_OUTPATIENT_CLINIC_OR_DEPARTMENT_OTHER): Payer: Commercial Managed Care - PPO | Admitting: Hematology

## 2022-04-07 ENCOUNTER — Inpatient Hospital Stay: Payer: Commercial Managed Care - PPO

## 2022-04-07 VITALS — BP 111/75 | HR 64 | Temp 98.9°F | Resp 18

## 2022-04-07 DIAGNOSIS — N2889 Other specified disorders of kidney and ureter: Secondary | ICD-10-CM

## 2022-04-07 DIAGNOSIS — Z803 Family history of malignant neoplasm of breast: Secondary | ICD-10-CM | POA: Diagnosis not present

## 2022-04-07 DIAGNOSIS — M545 Low back pain, unspecified: Secondary | ICD-10-CM | POA: Diagnosis not present

## 2022-04-07 DIAGNOSIS — M899 Disorder of bone, unspecified: Secondary | ICD-10-CM

## 2022-04-07 DIAGNOSIS — C649 Malignant neoplasm of unspecified kidney, except renal pelvis: Secondary | ICD-10-CM

## 2022-04-07 DIAGNOSIS — M79643 Pain in unspecified hand: Secondary | ICD-10-CM | POA: Insufficient documentation

## 2022-04-07 DIAGNOSIS — F17210 Nicotine dependence, cigarettes, uncomplicated: Secondary | ICD-10-CM | POA: Insufficient documentation

## 2022-04-07 DIAGNOSIS — Z5112 Encounter for antineoplastic immunotherapy: Secondary | ICD-10-CM | POA: Diagnosis present

## 2022-04-07 DIAGNOSIS — C7951 Secondary malignant neoplasm of bone: Secondary | ICD-10-CM

## 2022-04-07 DIAGNOSIS — C642 Malignant neoplasm of left kidney, except renal pelvis: Secondary | ICD-10-CM | POA: Insufficient documentation

## 2022-04-07 DIAGNOSIS — Z801 Family history of malignant neoplasm of trachea, bronchus and lung: Secondary | ICD-10-CM | POA: Insufficient documentation

## 2022-04-07 DIAGNOSIS — Z8042 Family history of malignant neoplasm of prostate: Secondary | ICD-10-CM | POA: Insufficient documentation

## 2022-04-07 DIAGNOSIS — Z95828 Presence of other vascular implants and grafts: Secondary | ICD-10-CM

## 2022-04-07 LAB — COMPREHENSIVE METABOLIC PANEL
ALT: 29 U/L (ref 0–44)
AST: 26 U/L (ref 15–41)
Albumin: 3.8 g/dL (ref 3.5–5.0)
Alkaline Phosphatase: 86 U/L (ref 38–126)
Anion gap: 8 (ref 5–15)
BUN: 21 mg/dL — ABNORMAL HIGH (ref 6–20)
CO2: 27 mmol/L (ref 22–32)
Calcium: 8.9 mg/dL (ref 8.9–10.3)
Chloride: 105 mmol/L (ref 98–111)
Creatinine, Ser: 0.9 mg/dL (ref 0.61–1.24)
GFR, Estimated: >60 mL/min (ref >60)
Glucose, Bld: 106 mg/dL — ABNORMAL HIGH (ref 70–99)
Potassium: 4.1 mmol/L (ref 3.5–5.1)
Sodium: 140 mmol/L (ref 135–145)
Total Bilirubin: 0.3 mg/dL (ref 0.3–1.2)
Total Protein: 7.5 g/dL (ref 6.5–8.1)

## 2022-04-07 LAB — CBC WITH DIFFERENTIAL/PLATELET
Abs Immature Granulocytes: 0.03 10*3/uL (ref 0.00–0.07)
Basophils Absolute: 0.1 10*3/uL (ref 0.0–0.1)
Basophils Relative: 1 %
Eosinophils Absolute: 0.4 10*3/uL (ref 0.0–0.5)
Eosinophils Relative: 3 %
HCT: 44.4 % (ref 39.0–52.0)
Hemoglobin: 14.6 g/dL (ref 13.0–17.0)
Immature Granulocytes: 0 %
Lymphocytes Relative: 18 %
Lymphs Abs: 2.2 10*3/uL (ref 0.7–4.0)
MCH: 30 pg (ref 26.0–34.0)
MCHC: 32.9 g/dL (ref 30.0–36.0)
MCV: 91.2 fL (ref 80.0–100.0)
Monocytes Absolute: 0.9 10*3/uL (ref 0.1–1.0)
Monocytes Relative: 7 %
Neutro Abs: 8.9 10*3/uL — ABNORMAL HIGH (ref 1.7–7.7)
Neutrophils Relative %: 71 %
Platelets: 211 10*3/uL (ref 150–400)
RBC: 4.87 MIL/uL (ref 4.22–5.81)
RDW: 14.1 % (ref 11.5–15.5)
WBC: 12.5 10*3/uL — ABNORMAL HIGH (ref 4.0–10.5)
nRBC: 0 % (ref 0.0–0.2)

## 2022-04-07 LAB — MAGNESIUM: Magnesium: 2.1 mg/dL (ref 1.7–2.4)

## 2022-04-07 MED ORDER — SODIUM CHLORIDE 0.9 % IV SOLN: Freq: Once | INTRAVENOUS | Status: AC

## 2022-04-07 MED ORDER — SODIUM CHLORIDE 0.9 % IV SOLN
480.0000 mg | Freq: Once | INTRAVENOUS | Status: AC
  Administered 2022-04-07: 480 mg via INTRAVENOUS
  Filled 2022-04-07: qty 48

## 2022-04-07 MED ORDER — SODIUM CHLORIDE 0.9% FLUSH
10.0000 mL | INTRAVENOUS | Status: DC | PRN
  Administered 2022-04-07: 10 mL

## 2022-04-07 MED ORDER — HEPARIN SOD (PORK) LOCK FLUSH 100 UNIT/ML IV SOLN
500.0000 [IU] | Freq: Once | INTRAVENOUS | Status: AC | PRN
  Administered 2022-04-07: 500 [IU]

## 2022-04-07 NOTE — Patient Instructions (Signed)
[facility] Discharge Instructions   You were seen and examined today by Dr. Delton Coombes.  He reviewed the results of your labs which are normal/stable.  We will proceed with your treatment today.  We will send in a prescription for prednisone to help with your hand pains and knee pain.   We will arrange for you to have a scan prior to your next visit.   Return as scheduled in 4 weeks.    Thank you for choosing [place] at [facility] to provide your oncology and hematology care.  To afford each patient quality time with our provider, please arrive at least 15 minutes before your scheduled appointment time.   If you have a lab appointment with the [facility] please come in thru the Main Entrance and check in at the main information desk.  You need to re-schedule your appointment should you arrive 10 or more minutes late.  We strive to give you quality time with our providers, and arriving late affects you and other patients whose appointments are after yours.  Also, if you no show three or more times for appointments you may be dismissed from the clinic at the providers discretion.     Again, thank you for choosing [facility].  Our hope is that these requests will decrease the amount of time that you wait before being seen by our physicians.       _____________________________________________________________  Should you have questions after your visit to [facility], please contact our office at [phone] and follow the prompts.  Our office hours are 8:00 a.m. and 4:30 p.m. Monday - Friday.  Please note that voicemails left after 4:00 p.m. may not be returned until the following business day.  We are closed weekends and major holidays.  You do have access to a nurse 24-7, just call the main number to the clinic [phone] and do not press any options, hold on the line and a nurse will answer the  phone.    For prescription refill requests, have your pharmacy contact our office and allow 72 hours.    Due to Covid, you will need to wear a mask upon entering the hospital. If you do not have a mask, a mask will be given to you at the Main Entrance upon arrival. For doctor visits, patients may have 1 support person age 56 or older with them. For treatment visits, patients can not have anyone with them due to social distancing guidelines and our immunocompromised population.

## 2022-04-07 NOTE — Patient Instructions (Signed)
[facility]  Discharge Instructions: Thank you for choosing [facility] to provide your oncology and hematology care.  If you have a lab appointment with the Cancer Center, please come in thru the Main Entrance and check in at the main information desk.  Wear comfortable clothing and clothing appropriate for easy access to any Portacath or PICC line.   We strive to give you quality time with your provider. You may need to reschedule your appointment if you arrive late (15 or more minutes).  Arriving late affects you and other patients whose appointments are after yours.  Also, if you miss three or more appointments without notifying the office, you may be dismissed from the clinic at the provider's discretion.      For prescription refill requests, have your pharmacy contact our office and allow 72 hours for refills to be completed.    Today you received the following chemotherapy and/or immunotherapy agents Opdivo   To help prevent nausea and vomiting after your treatment, we encourage you to take your nausea medication as directed.   Nivolumab Injection What is this medication? NIVOLUMAB (nye VOL ue mab) treats some types of cancer. It works by helping your immune system slow or stop the spread of cancer cells. It is a monoclonal antibody. This medicine may be used for other purposes; ask your health care provider or pharmacist if you have questions. COMMON BRAND NAME(S): Opdivo What should I tell my care team before I take this medication? They need to know if you have any of these conditions: Allogeneic stem cell transplant (uses someone else's stem cells) Autoimmune diseases, such as Crohn disease, ulcerative colitis, lupus History of chest radiation Nervous system problems, such as Guillain-Barre syndrome or myasthenia gravis Organ transplant An unusual or allergic reaction to nivolumab, other medications, foods, dyes, or preservatives Pregnant or  trying to get pregnant Breast-feeding How should I use this medication? This medication is infused into a vein. It is given in a hospital or clinic setting. A special MedGuide will be given to you before each treatment. Be sure to read this information carefully each time. Talk to your care team about the use of this medication in children. While it may be prescribed for children as young as 12 years for selected conditions, precautions do apply. Overdosage: If you think you have taken too much of this medicine contact a poison control center or emergency room at once. NOTE: This medicine is only for you. Do not share this medicine with others. What if I miss a dose? Keep appointments for follow-up doses. It is important not to miss your dose. Call your care team if you are unable to keep an appointment. What may interact with this medication? Interactions have not been studied. This list may not describe all possible interactions. Give your health care provider a list of all the medicines, herbs, non-prescription drugs, or dietary supplements you use. Also tell them if you smoke, drink alcohol, or use illegal drugs. Some items may interact with your medicine. What should I watch for while using this medication? Your condition will be monitored carefully while you are receiving this medication. You may need blood work while taking this medication. This medication may cause serious skin reactions. They can happen weeks to months after starting the medication. Contact your care team right away if you notice fevers or flu-like symptoms with a rash. The rash may be red or purple and then turn into blisters or peeling   of the skin. You may also notice a red rash with swelling of the face, lips, or lymph nodes in your neck or under your arms. Tell your care team right away if you have any change in your eyesight. Talk to your care team if you are pregnant or think you might be pregnant. A negative  pregnancy test is required before starting this medication. A reliable form of contraception is recommended while taking this medication and for 5 months after the last dose. Talk to your care team about effective forms of contraception. Do not breast-feed while taking this medication and for 5 months after the last dose. What side effects may I notice from receiving this medication? Side effects that you should report to your care team as soon as possible: Allergic reactions--skin rash, itching, hives, swelling of the face, lips, tongue, or throat Dry cough, shortness of breath or trouble breathing Eye pain, redness, irritation, or discharge with blurry or decreased vision Heart muscle inflammation--unusual weakness or fatigue, shortness of breath, chest pain, fast or irregular heartbeat, dizziness, swelling of the ankles, feet, or hands Hormone gland problems--headache, sensitivity to light, unusual weakness or fatigue, dizziness, fast or irregular heartbeat, increased sensitivity to cold or heat, excessive sweating, constipation, hair loss, increased thirst or amount of urine, tremors or shaking, irritability Infusion reactions--chest pain, shortness of breath or trouble breathing, feeling faint or lightheaded Kidney injury (glomerulonephritis)--decrease in the amount of urine, red or dark brown urine, foamy or bubbly urine, swelling of the ankles, hands, or feet Liver injury--right upper belly pain, loss of appetite, nausea, light-colored stool, dark yellow or brown urine, yellowing skin or eyes, unusual weakness or fatigue Pain, tingling, or numbness in the hands or feet, muscle weakness, change in vision, confusion or trouble speaking, loss of balance or coordination, trouble walking, seizures Rash, fever, and swollen lymph nodes Redness, blistering, peeling, or loosening of the skin, including inside the mouth Sudden or severe stomach pain, bloody diarrhea, fever, nausea, vomiting Side effects  that usually do not require medical attention (report these to your care team if they continue or are bothersome): Bone, joint, or muscle pain Diarrhea Fatigue Loss of appetite Nausea Skin rash This list may not describe all possible side effects. Call your doctor for medical advice about side effects. You may report side effects to FDA at 1-800-FDA-1088. Where should I keep my medication? This medication is given in a hospital or clinic. It will not be stored at home. NOTE: This sheet is a summary. It may not cover all possible information. If you have questions about this medicine, talk to your doctor, pharmacist, or health care provider.  2023 Elsevier/Gold Standard (2021-09-27 00:00:00)  BELOW ARE SYMPTOMS THAT SHOULD BE REPORTED IMMEDIATELY: *FEVER GREATER THAN 100.4 F (38 C) OR HIGHER *CHILLS OR SWEATING *NAUSEA AND VOMITING THAT IS NOT CONTROLLED WITH YOUR NAUSEA MEDICATION *UNUSUAL SHORTNESS OF BREATH *UNUSUAL BRUISING OR BLEEDING *URINARY PROBLEMS (pain or burning when urinating, or frequent urination) *BOWEL PROBLEMS (unusual diarrhea, constipation, pain near the anus) TENDERNESS IN MOUTH AND THROAT WITH OR WITHOUT PRESENCE OF ULCERS (sore throat, sores in mouth, or a toothache) UNUSUAL RASH, SWELLING OR PAIN  UNUSUAL VAGINAL DISCHARGE OR ITCHING   Items with * indicate a potential emergency and should be followed up as soon as possible or go to the Emergency Department if any problems should occur.  Please show the CHEMOTHERAPY ALERT CARD or IMMUNOTHERAPY ALERT CARD at check-in to the Emergency Department and triage nurse.  Should you   have questions after your visit or need to cancel or reschedule your appointment, please contact [facility] [phone]  and follow the prompts.  Office hours are 8:00 a.m. to 4:30 p.m. Monday - Friday. Please note that voicemails left after 4:00 p.m. may not be returned until the following business day.  We are closed  weekends and major holidays. You have access to a nurse at all times for urgent questions. Please call the main number to the clinic [phone] and follow the prompts.  For any non-urgent questions, you may also contact your provider using MyChart. We now offer e-Visits for anyone 18 and older to request care online for non-urgent symptoms. For details visit [email or web address].   Also download the MyChart app! Go to the app store, search "MyChart", open the app, select [facility], and log in with your MyChart username and password.  Masks are optional in the cancer centers. If you would like for your care team to wear a mask while they are taking care of you, please let them know. You may have one support person who is at least 56 years old accompany you for your appointments.  

## 2022-04-07 NOTE — Progress Notes (Signed)
Pt presents today for Opdivo per provider's order. Vital signs and labs WNL for treatment. Okay to proceed with treatment today per Dr.K.  Opdivo given today per MD orders. Tolerated infusion without adverse affects. Vital signs stable. No complaints at this time. Discharged from clinic ambulatory in stable condition. Alert and oriented x 3. F/U with [facility] as scheduled.   

## 2022-04-07 NOTE — Progress Notes (Signed)
Patient has been examined by Dr. Katragadda, and vital signs and labs have been reviewed. ANC, Creatinine, LFTs, hemoglobin, and platelets are within treatment parameters per M.D. - pt may proceed with treatment.  Primary RN and pharmacy notified.  

## 2022-04-07 NOTE — Progress Notes (Signed)
[address] [place], [place] [address]   CLINIC:  Medical Oncology/Hematology  PCP:  [name], [place] / [name] [address] [phone]   REASON FOR VISIT:  Follow-up for metastatic kidney cancer  PRIOR THERAPY: none  NGS Results: not done  CURRENT THERAPY: Nivolumab + Ipilimumab q21d / Nivolumab q28d  BRIEF ONCOLOGIC HISTORY:  Oncology History  Metastatic renal cell carcinoma to bone ([place])  11/04/2021 Initial Diagnosis   Metastatic renal cell carcinoma to bone ([place])   11/17/2021 - 02/10/2022 Chemotherapy   Patient is on Treatment Plan : RENAL CELL CARCINOMA Nivolumab + Ipilimumab q21d / Nivolumab q28d     11/17/2021 -  Chemotherapy   Patient is on Treatment Plan : RENAL CELL CARCINOMA Nivolumab (3) + Ipilimumab (1) q21d x 4 cycles  / Nivolumab (480) q28d       CANCER STAGING:  Cancer Staging  Metastatic renal cell carcinoma to bone [facility]) Staging form: Kidney, AJCC 8th Edition - Clinical stage from 11/04/2021: Stage IV (cT1b, cN1, pM1) - Unsigned   INTERVAL HISTORY:  [name], a 56 y.o. male, seen for follow-up of metastatic kidney cancer and toxicity assessment prior to next cycle of nivolumab.  Denies any immunotherapy related side effects.  However reports pains in the knees and small joints of the hands for the past few weeks.  He also reports some stiffness.  REVIEW OF SYSTEMS:  Review of Systems  Constitutional:  Negative for appetite change and fatigue.  Cardiovascular:  Negative for chest pain and leg swelling.  Musculoskeletal:  Positive for arthralgias (Stiffness in the knees and hands.).  Neurological:  Positive for headaches.  All other systems reviewed and are negative.   PAST MEDICAL/SURGICAL HISTORY:  Past Medical History:  Diagnosis Date   Plantar fasciitis    Port-A-Cath in place 11/10/2021   Past Surgical History:  Procedure Laterality Date   IR IMAGING GUIDED PORT INSERTION  10/15/2021     SOCIAL HISTORY:  Social History   Socioeconomic History   Marital status: Single    Spouse name: Not on file   Number of children: 1   Years of education: 11   Highest education level: 11th grade  Occupational History   Not on file  Tobacco Use   Smoking status: Every Day    Packs/day: 1.00    Years: 36.00    Total pack years: 36.00    Types: Cigarettes    Start date: 06/13/1978   Smokeless tobacco: Never  Substance and Sexual Activity   Alcohol use: Yes    Alcohol/week: 7.0 standard drinks of alcohol    Types: 7 Cans of beer per week   Drug use: Yes    Frequency: 1.0 times per week    Types: Marijuana   Sexual activity: Not on file  Other Topics Concern   Not on file  Social History Narrative   Not on file   Social Determinants of Health   Financial Resource Strain: Not on file  Food Insecurity: Not on file  Transportation Needs: Not on file  Physical Activity: Not on file  Stress: Not on file  Social Connections: Not on file  Intimate Partner Violence: Not on file    FAMILY HISTORY:  Family History  Problem Relation Age of Onset   Hypertension Mother    Parkinson's disease Father    Breast cancer Maternal Aunt        dx >50   Lung  cancer Paternal Grandfather    Breast cancer Cousin        dx 40s   Prostate cancer Cousin        dx 28s    CURRENT MEDICATIONS:  Current Outpatient Medications  Medication Sig Dispense Refill   ciclopirox (PENLAC) 8 % solution Apply topically at bedtime. Apply over nail and surrounding skin. Apply daily over previous coat. After seven (7) days, may remove with alcohol and continue cycle. 6.6 mL 0   cyclobenzaprine (FLEXERIL) 10 MG tablet Take 1 tablet (10 mg total) by mouth 3 (three) times daily as needed for muscle spasms. 90 tablet 1   Ipilimumab (YERVOY IV) Inject into the vein every 21 ( twenty-one) days. Every 21 days x 4 cycles then d/c     meloxicam (MOBIC) 15 MG tablet Take 1 tablet (15 mg total) by mouth daily  as needed for pain. 30 tablet 0   Nivolumab (OPDIVO IV) Inject into the vein every 21 ( twenty-one) days. Every 21 days x 4 cycles, then every 28 days     predniSONE (DELTASONE) 50 MG tablet Take one tablet by mouth 13 hours, 7 hours, and 1 hour prior prior to CT scan 3 tablet 0   No current facility-administered medications for this visit.   Facility-Administered Medications Ordered in Other Visits  Medication Dose Route Frequency Provider Last Rate Last Admin   diphenhydrAMINE (BENADRYL) 50 MG/ML injection            famotidine (PEPCID) 20-0.9 MG/50ML-% IVPB             ALLERGIES:  Allergies  Allergen Reactions   Iodine     Throat felt like closed Was in hospital for allergy and almost died- per patient     PHYSICAL EXAM:  Performance status (ECOG): 0 - Asymptomatic  There were no vitals filed for this visit.  Wt Readings from Last 3 Encounters:  04/07/22 174 lb (78.9 kg)  03/11/22 170 lb 9.6 oz (77.4 kg)  02/10/22 166 lb 8 oz (75.5 kg)   Physical Exam Vitals reviewed.  Constitutional:      Appearance: Normal appearance.  Cardiovascular:     Rate and Rhythm: Normal rate and regular rhythm.     Pulses: Normal pulses.     Heart sounds: Normal heart sounds.  Pulmonary:     Effort: Pulmonary effort is normal.     Breath sounds: Normal breath sounds.  Neurological:     General: No focal deficit present.     Mental Status: He is alert and oriented to person, place, and time.  Psychiatric:        Mood and Affect: Mood normal.        Behavior: Behavior normal.     LABORATORY DATA:  I have reviewed the labs as listed.     Latest Ref Rng & Units 04/07/2022    9:33 AM 03/11/2022    9:04 AM 02/10/2022   11:47 AM  CBC  WBC 4.0 - 10.5 K/uL 12.5  8.5  9.2   Hemoglobin 13.0 - 17.0 g/dL 14.6  14.7  14.5   Hematocrit 39.0 - 52.0 % 44.4  44.8  44.0   Platelets 150 - 400 K/uL 211  204  189       Latest Ref Rng & Units 04/07/2022    9:33 AM 03/11/2022    9:04 AM 02/10/2022    11:47 AM  CMP  Glucose 70 - 99 mg/dL 106  94  120  BUN 6 - 20 mg/dL 21  21  18    Creatinine 0.61 - 1.24 mg/dL 0.90  0.93  0.79   Sodium 135 - 145 mmol/L 140  141  139   Potassium 3.5 - 5.1 mmol/L 4.1  4.1  4.0   Chloride 98 - 111 mmol/L 105  105  106   CO2 22 - 32 mmol/L 27  26  27    Calcium 8.9 - 10.3 mg/dL 8.9  9.1  9.0   Total Protein 6.5 - 8.1 g/dL 7.5  7.8  7.4   Total Bilirubin 0.3 - 1.2 mg/dL 0.3  0.5  0.6   Alkaline Phos 38 - 126 U/L 86  95  86   AST 15 - 41 U/L 26  23  18    ALT 0 - 44 U/L 29  27  19      DIAGNOSTIC IMAGING:  I have independently reviewed the scans and discussed with the patient. No results found.   ASSESSMENT:  Metastatic kidney cancer to the bones: - CT chest lung cancer screening scan (08/31/2021): Lung RADS 1S.  Possible soft tissue fullness in the upper pole of the left kidney. - US renal (09/15/2021): Mass with solid component in the upper pole of the left kidney measuring 6.1 cm, highly suspicious for RCC. - MRI of the abdomen (09/22/2021): 6.5 cm left kidney upper pole solid enhancing renal mass favoring RCC.  Scattered enhancing bone lesions suspicious for osseous metastatic disease, largest at L3 and in the central sacrum.  1.3 x 1.4 cm mass of the left adrenal gland has indeterminate characteristics by MRI but on CT chest seems to have a low-density favoring adenoma.  No tumor thrombus in the left renal vein. - PET scan (10/07/2021): Hypermetabolic left kidney mass with SUV 7.1.  Bilateral abdominal retroperitoneal hypermetabolic lymph nodes.  Multifocal bone metastasis.  Central sacrum, inferior aspect of L3 vertebral body, T8 body, C7 spinous process and medial right clavicle. 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  Biopsy shows a poorly differentiated neoplasm, IHC positive for CK8/18 (subset), CD10 and cytokeratin AE 1/3.  Cells are negative for CK20, S100, HMB45, SOX10,  calretinin, P504S, prostein, PSA, desmin and CDX2. - I have talked to our pathologist Dr. Melina Copa who thought it is most likely clear-cell histology although IHC does not support it. - IMDC criteria: 1 risk factor with less than 1 year from time of diagnosis to systemic therapy.  Intermediate risk group - Opdivo and Yervoy cycle 1 started on 11/17/2021. 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  He is a potential candidate for belzutifan on further progression.    Social/family history: - His nephew lives with him.  He has a 46-year-old daughter who was given for adoption.  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  He is a current active smoker, 1 pack/day for close to 40 years.  He drinks 1 beer at night. - Maternal aunt had breast cancer.  Maternal cousin has prostate cancer and another maternal cousin had breast cancer.  Maternal cousin also had kidney removed, thought to be secondary to cancer.  Paternal grandfather had lung cancer.   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 - He does not have any significant immunotherapy related side effects. - Reviewed labs today which showed normal LFTs.  CBC was grossly normal.  TSH was 0.79. - He will proceed with nivolumab today. - RTC 4 weeks.  We will plan to repeat PET CT scan as he cannot get IV contrast due to potential iodine allergy.   2.  Low back pain: - Continue Mobic and Flexeril as needed.  3.  Bone metastatic disease: - He was evaluated by his dentist.  He was referred to root canal specialist in [place].  Patient is yet to see him.  We will start denosumab after treatment done.  4.  Stiffness and pain in the  hand joints and knees: - Most likely immunotherapy related.  Mobic and Flexeril not helping. - We will start him on low-dose prednisone 5 mg daily.   Orders placed this encounter:  No orders of the defined types were placed in this encounter.    Derek Jack, MD [place] [phone]

## 2022-04-14 ENCOUNTER — Other Ambulatory Visit: Payer: Self-pay | Admitting: *Deleted

## 2022-04-14 MED ORDER — PREDNISONE 5 MG PO TABS: 5.0000 mg | ORAL_TABLET | Freq: Every day | ORAL | 2 refills | Status: DC

## 2022-04-28 ENCOUNTER — Other Ambulatory Visit: Payer: Self-pay | Admitting: Hematology

## 2022-04-28 ENCOUNTER — Encounter (HOSPITAL_COMMUNITY)
Admission: RE | Admit: 2022-04-28 | Discharge: 2022-04-28 | Disposition: A | Discharge status: Home / Self Care | Payer: Commercial Managed Care - PPO | Source: Ambulatory Visit | Attending: Hematology | Admitting: Hematology

## 2022-04-28 DIAGNOSIS — C7951 Secondary malignant neoplasm of bone: Secondary | ICD-10-CM | POA: Diagnosis present

## 2022-04-28 DIAGNOSIS — Z95828 Presence of other vascular implants and grafts: Secondary | ICD-10-CM

## 2022-04-28 DIAGNOSIS — C649 Malignant neoplasm of unspecified kidney, except renal pelvis: Secondary | ICD-10-CM

## 2022-04-28 MED ORDER — FLUDEOXYGLUCOSE F - 18 (FDG) INJECTION
9.6500 | Freq: Once | INTRAVENOUS | Status: AC | PRN
  Administered 2022-04-28: 9.65 via INTRAVENOUS

## 2022-05-02 ENCOUNTER — Ambulatory Visit (HOSPITAL_COMMUNITY): Payer: Commercial Managed Care - PPO

## 2022-05-03 ENCOUNTER — Ambulatory Visit (INDEPENDENT_AMBULATORY_CARE_PROVIDER_SITE_OTHER): Payer: Commercial Managed Care - PPO

## 2022-05-03 DIAGNOSIS — Z23 Encounter for immunization: Secondary | ICD-10-CM

## 2022-05-09 ENCOUNTER — Inpatient Hospital Stay: Payer: Commercial Managed Care - PPO | Attending: Hematology | Admitting: Hematology

## 2022-05-09 ENCOUNTER — Inpatient Hospital Stay: Payer: Commercial Managed Care - PPO

## 2022-05-09 VITALS — BP 129/82 | HR 62 | Temp 97.5°F | Resp 18

## 2022-05-09 DIAGNOSIS — C649 Malignant neoplasm of unspecified kidney, except renal pelvis: Secondary | ICD-10-CM

## 2022-05-09 DIAGNOSIS — Z5112 Encounter for antineoplastic immunotherapy: Secondary | ICD-10-CM | POA: Diagnosis not present

## 2022-05-09 DIAGNOSIS — Z95828 Presence of other vascular implants and grafts: Secondary | ICD-10-CM | POA: Diagnosis not present

## 2022-05-09 DIAGNOSIS — F17210 Nicotine dependence, cigarettes, uncomplicated: Secondary | ICD-10-CM | POA: Diagnosis not present

## 2022-05-09 DIAGNOSIS — M899 Disorder of bone, unspecified: Secondary | ICD-10-CM

## 2022-05-09 DIAGNOSIS — C642 Malignant neoplasm of left kidney, except renal pelvis: Secondary | ICD-10-CM | POA: Diagnosis not present

## 2022-05-09 DIAGNOSIS — Z79899 Other long term (current) drug therapy: Secondary | ICD-10-CM | POA: Insufficient documentation

## 2022-05-09 DIAGNOSIS — C7951 Secondary malignant neoplasm of bone: Secondary | ICD-10-CM | POA: Diagnosis not present

## 2022-05-09 DIAGNOSIS — N2889 Other specified disorders of kidney and ureter: Secondary | ICD-10-CM

## 2022-05-09 LAB — COMPREHENSIVE METABOLIC PANEL
ALT: 21 U/L (ref 0–44)
AST: 22 U/L (ref 15–41)
Albumin: 3.4 g/dL — ABNORMAL LOW (ref 3.5–5.0)
Alkaline Phosphatase: 84 U/L (ref 38–126)
Anion gap: 8 (ref 5–15)
BUN: 23 mg/dL — ABNORMAL HIGH (ref 6–20)
CO2: 25 mmol/L (ref 22–32)
Calcium: 8.5 mg/dL — ABNORMAL LOW (ref 8.9–10.3)
Chloride: 105 mmol/L (ref 98–111)
Creatinine, Ser: 0.87 mg/dL (ref 0.61–1.24)
GFR, Estimated: >60 mL/min (ref >60)
Glucose, Bld: 121 mg/dL — ABNORMAL HIGH (ref 70–99)
Potassium: 3.9 mmol/L (ref 3.5–5.1)
Sodium: 138 mmol/L (ref 135–145)
Total Bilirubin: 0.3 mg/dL (ref 0.3–1.2)
Total Protein: 6.8 g/dL (ref 6.5–8.1)

## 2022-05-09 LAB — CBC WITH DIFFERENTIAL/PLATELET
Abs Immature Granulocytes: 0.04 10*3/uL (ref 0.00–0.07)
Basophils Absolute: 0.1 10*3/uL (ref 0.0–0.1)
Basophils Relative: 1 %
Eosinophils Absolute: 0.5 10*3/uL (ref 0.0–0.5)
Eosinophils Relative: 5 %
HCT: 41.9 % (ref 39.0–52.0)
Hemoglobin: 13.6 g/dL (ref 13.0–17.0)
Immature Granulocytes: 0 %
Lymphocytes Relative: 18 %
Lymphs Abs: 1.9 10*3/uL (ref 0.7–4.0)
MCH: 29.5 pg (ref 26.0–34.0)
MCHC: 32.5 g/dL (ref 30.0–36.0)
MCV: 90.9 fL (ref 80.0–100.0)
Monocytes Absolute: 0.7 10*3/uL (ref 0.1–1.0)
Monocytes Relative: 7 %
Neutro Abs: 7 10*3/uL (ref 1.7–7.7)
Neutrophils Relative %: 69 %
Platelets: 182 10*3/uL (ref 150–400)
RBC: 4.61 MIL/uL (ref 4.22–5.81)
RDW: 13.9 % (ref 11.5–15.5)
WBC: 10.2 10*3/uL (ref 4.0–10.5)
nRBC: 0 % (ref 0.0–0.2)

## 2022-05-09 LAB — TSH: TSH: 1.014 u[IU]/mL (ref 0.350–4.500)

## 2022-05-09 LAB — MAGNESIUM: Magnesium: 2 mg/dL (ref 1.7–2.4)

## 2022-05-09 MED ORDER — SODIUM CHLORIDE 0.9 % IV SOLN
480.0000 mg | Freq: Once | INTRAVENOUS | Status: AC
  Administered 2022-05-09: 480 mg via INTRAVENOUS
  Filled 2022-05-09: qty 48

## 2022-05-09 MED ORDER — SODIUM CHLORIDE 0.9% FLUSH
10.0000 mL | INTRAVENOUS | Status: DC | PRN
  Administered 2022-05-09: 10 mL

## 2022-05-09 MED ORDER — SODIUM CHLORIDE 0.9 % IV SOLN: Freq: Once | INTRAVENOUS | Status: AC

## 2022-05-09 MED ORDER — HEPARIN SOD (PORK) LOCK FLUSH 100 UNIT/ML IV SOLN
500.0000 [IU] | Freq: Once | INTRAVENOUS | Status: AC | PRN
  Administered 2022-05-09: 500 [IU]

## 2022-05-09 NOTE — Progress Notes (Signed)
Patient presents today for Opdivo infusion.  Patient is in satisfactory condition with no complaints voiced.  Vital signs are stable.  Labs reviewed by Dr. Delton Coombes during his office visit.  All labs are within treatment parameters. We will proceed with treatment per MD orders.   Patient tolerated treatment well with no complaints voiced.  Patient left ambulatory in stable condition.  Vital signs stable at discharge.  Follow up as scheduled.

## 2022-05-09 NOTE — Patient Instructions (Addendum)
[facility]  Discharge Instructions  You were seen and examined today by Dr. Delton Coombes.  Dr. Delton Coombes has reviewed your PET scan. Your bones had an excellent response to treatment, the kidney is stable, but the lymph nodes are slightly more prominent. Dr. Delton Coombes has recommended continuing treatment and rechecking a PET scan in approximately 3 months.  Proceed with treatment today as planned.  Follow-up as scheduled.  Thank you for choosing [facility] to provide your oncology and hematology care.   To afford each patient quality time with our provider, please arrive at least 15 minutes before your scheduled appointment time. You may need to reschedule your appointment if you arrive late (10 or more minutes). Arriving late affects you and other patients whose appointments are after yours.  Also, if you miss three or more appointments without notifying the office, you may be dismissed from the clinic at the provider's discretion.    Again, thank you for choosing [facility].  Our hope is that these requests will decrease the amount of time that you wait before being seen by our physicians.   If you have a lab appointment with the [facility] please come in thru the Main Entrance and check in at the main information desk.           _____________________________________________________________  Should you have questions after your visit to [facility], please contact our office at [phone] and follow the prompts.  Our office hours are 8:00 a.m. to 4:30 p.m. Monday - Thursday and 8:00 a.m. to 2:30 p.m. Friday.  Please note that voicemails left after 4:00 p.m. may not be returned until the following business day.  We are closed weekends and all major holidays.  You do have access to a nurse 24-7, just call the main number to the clinic [phone] and do not press any options, hold on the line and a nurse  will answer the phone.    For prescription refill requests, have your pharmacy contact our office and allow 72 hours.    Masks are optional in the cancer centers. If you would like for your care team to wear a mask while they are taking care of you, please let them know. You may have one support person who is at least 56 years old accompany you for your appointments.

## 2022-05-09 NOTE — Progress Notes (Signed)
[address] [place], [place] [address]   CLINIC:  Medical Oncology/Hematology  PCP:  [name], [address] / [name] [address] [phone]   REASON FOR VISIT:  Follow-up for metastatic kidney cancer  PRIOR THERAPY: none  NGS Results: not done  CURRENT THERAPY: Nivolumab + Ipilimumab q21d / Nivolumab q28d  BRIEF ONCOLOGIC HISTORY:  Oncology History  Metastatic renal cell carcinoma to bone ([facility])  11/04/2021 Initial Diagnosis   Metastatic renal cell carcinoma to bone ([facility])   11/17/2021 - 02/10/2022 Chemotherapy   Patient is on Treatment Plan : RENAL CELL CARCINOMA Nivolumab + Ipilimumab q21d / Nivolumab q28d     11/17/2021 -  Chemotherapy   Patient is on Treatment Plan : RENAL CELL CARCINOMA Nivolumab (3) + Ipilimumab (1) q21d x 4 cycles  / Nivolumab (480) q28d       CANCER STAGING:  Cancer Staging  Metastatic renal cell carcinoma to bone [facility]) Staging form: Kidney, AJCC 8th Edition - Clinical stage from 11/04/2021: Stage IV (cT1b, cN1, pM1) - Unsigned   INTERVAL HISTORY:  [name], a 56 y.o. male, seen for follow-up of metastatic kidney cancer and toxicity assessment prior to next cycle of nivolumab.  He reports knee pains and hand joint pains have improved since prednisone was started.  He is not taking it on a daily basis.  Denies any other immunotherapy related side effects.  Energy levels are 80%.  He reports that when he gets anxious he occasionally has chest pain which relieves after anxiety improves.  REVIEW OF SYSTEMS:  Review of Systems  Constitutional:  Negative for appetite change and fatigue.  Cardiovascular:  Positive for chest pain (Occasional chest pain due to anxiety). Negative for leg swelling.  Musculoskeletal:  Positive for arthralgias (Stiffness in the knees and hands improved).  Neurological:  Positive for headaches.  Psychiatric/Behavioral:  Positive for depression.   All other systems reviewed  and are negative.   PAST MEDICAL/SURGICAL HISTORY:  Past Medical History:  Diagnosis Date   Plantar fasciitis    Port-A-Cath in place 11/10/2021   Past Surgical History:  Procedure Laterality Date   IR IMAGING GUIDED PORT INSERTION  10/15/2021    SOCIAL HISTORY:  Social History   Socioeconomic History   Marital status: Single    Spouse name: Not on file   Number of children: 1   Years of education: 11   Highest education level: 11th grade  Occupational History   Not on file  Tobacco Use   Smoking status: Every Day    Packs/day: 1.00    Years: 36.00    Total pack years: 36.00    Types: Cigarettes    Start date: 06/13/1978   Smokeless tobacco: Never  Substance and Sexual Activity   Alcohol use: Yes    Alcohol/week: 7.0 standard drinks of alcohol    Types: 7 Cans of beer per week   Drug use: Yes    Frequency: 1.0 times per week    Types: Marijuana   Sexual activity: Not on file  Other Topics Concern   Not on file  Social History Narrative   Not on file   Social Determinants of Health   Financial Resource Strain: Not on file  Food Insecurity: Not on file  Transportation Needs: Not on file  Physical Activity: Not on file  Stress: Not on file  Social Connections: Not on file  Intimate Partner Violence: Not on file  FAMILY HISTORY:  Family History  Problem Relation Age of Onset   Hypertension Mother    Parkinson's disease Father    Breast cancer Maternal Aunt        dx >50   Lung cancer Paternal Grandfather    Breast cancer Cousin        dx 68s   Prostate cancer Cousin        dx 30s    CURRENT MEDICATIONS:  Current Outpatient Medications  Medication Sig Dispense Refill   ciclopirox (PENLAC) 8 % solution Apply topically at bedtime. Apply over nail and surrounding skin. Apply daily over previous coat. After seven (7) days, may remove with alcohol and continue cycle. 6.6 mL 0   cyclobenzaprine (FLEXERIL) 10 MG tablet Take 1 tablet (10 mg total) by mouth 3  (three) times daily as needed for muscle spasms. 90 tablet 1   meloxicam (MOBIC) 15 MG tablet Take 1 tablet (15 mg total) by mouth daily as needed for pain. 30 tablet 0   Nivolumab (OPDIVO IV) Inject into the vein every 21 ( twenty-one) days. Every 21 days x 4 cycles, then every 28 days     predniSONE (DELTASONE) 5 MG tablet Take 1 tablet (5 mg total) by mouth daily with breakfast. 30 tablet 2   predniSONE (DELTASONE) 50 MG tablet Take one tablet by mouth 13 hours, 7 hours, and 1 hour prior prior to CT scan 3 tablet 0   No current facility-administered medications for this visit.   Facility-Administered Medications Ordered in Other Visits  Medication Dose Route Frequency Provider Last Rate Last Admin   diphenhydrAMINE (BENADRYL) 50 MG/ML injection            famotidine (PEPCID) 20-0.9 MG/50ML-% IVPB            sodium chloride flush (NS) 0.9 % injection 10 mL  10 mL Intracatheter PRN Derek Jack, MD   10 mL at 05/09/22 1303    ALLERGIES:  Allergies  Allergen Reactions   Iodine     Throat felt like closed Was in hospital for allergy and almost died- per patient     PHYSICAL EXAM:  Performance status (ECOG): 0 - Asymptomatic  There were no vitals filed for this visit.  Wt Readings from Last 3 Encounters:  05/09/22 184 lb 3.2 oz (83.6 kg)  04/07/22 174 lb (78.9 kg)  03/11/22 170 lb 9.6 oz (77.4 kg)   Physical Exam Vitals reviewed.  Constitutional:      Appearance: Normal appearance.  Cardiovascular:     Rate and Rhythm: Normal rate and regular rhythm.     Pulses: Normal pulses.     Heart sounds: Normal heart sounds.  Pulmonary:     Effort: Pulmonary effort is normal.     Breath sounds: Normal breath sounds.  Neurological:     General: No focal deficit present.     Mental Status: He is alert and oriented to person, place, and time.  Psychiatric:        Mood and Affect: Mood normal.        Behavior: Behavior normal.     LABORATORY DATA:  I have reviewed the  labs as listed.     Latest Ref Rng & Units 05/09/2022    9:17 AM 04/07/2022    9:33 AM 03/11/2022    9:04 AM  CBC  WBC 4.0 - 10.5 K/uL 10.2  12.5  8.5   Hemoglobin 13.0 - 17.0 g/dL 13.6  14.6  14.7   Hematocrit  39.0 - 52.0 % 41.9  44.4  44.8   Platelets 150 - 400 K/uL 182  211  204       Latest Ref Rng & Units 05/09/2022    9:17 AM 04/07/2022    9:33 AM 03/11/2022    9:04 AM  CMP  Glucose 70 - 99 mg/dL 121  106  94   BUN 6 - 20 mg/dL _0 Creatinine 0.61 - 1.24 mg/dL 0.87  0.90  0.93   Sodium 135 - 145 mmol/L 138  140  141   Potassium 3.5 - 5.1 mmol/L 3.9  4.1  4.1   Chloride 98 - 111 mmol/L 105  105  105   CO2 22 - 32 mmol/L _1 Calcium 8.9 - 10.3 mg/dL 8.5  8.9  9.1   Total Protein 6.5 - 8.1 g/dL 6.8  7.5  7.8   Total Bilirubin 0.3 - 1.2 mg/dL 0.3  0.3  0.5   Alkaline Phos 38 - 126 U/L 84  86  95   AST 15 - 41 U/L _2 ALT 0 - 44 U/L _3 DIAGNOSTIC IMAGING:  I have independently reviewed the scans and discussed with the patient. NM PET Image Restage (PS) Skull Base to Thigh (F-18 FDG)  Result Date: 04/29/2022 CLINICAL DATA:  Subsequent treatment strategy for metastatic left renal cell carcinoma on immunotherapy. EXAM: NUCLEAR MEDICINE PET SKULL BASE TO THIGH TECHNIQUE: 9.7 mCi F-18 FDG was injected intravenously. Full-ring PET imaging was performed from the skull base to thigh after the radiotracer. CT data was obtained and used for attenuation correction and anatomic localization. Fasting blood glucose: 104 mg/dl COMPARISON:  02/04/2022 CT chest, abdomen and pelvis. 10/07/2021 PET-CT. FINDINGS: Mediastinal blood pool activity: SUV max 2.4 Liver activity: SUV max NA NECK: No hypermetabolic lymph nodes in the neck. Incidental CT findings: Mucoperiosteal thickening throughout the maxillary sinuses and ethmoidal air cells, without air-fluid levels. Right internal jugular Port-A-Cath terminates in the lower third of the SVC. CHEST: Hypermetabolic  1.0 cm right retrocrural lymph node with max SUV 4.7 (series 3/image 160), previously 0.9 cm with max SUV 3.2, slightly increased in size and metabolism. Otherwise no enlarged or hypermetabolic axillary, mediastinal or hilar lymph nodes. No hypermetabolic pulmonary findings. Incidental CT findings: Coronary atherosclerosis. Atherosclerotic nonaneurysmal thoracic aorta. Mild paraseptal and centrilobular emphysema. No significant new or enlarging pulmonary nodules. ABDOMEN/PELVIS: Poorly delineated 5.5 x 4.3 cm lateral upper left renal mass with max SUV 7.6 (series 3/image 160), previously 5.8 x 5.3 cm with max SUV 7.1, slightly decreased in size and not substantially changed in metabolism. Multiple enlarged hypermetabolic left para-aortic and aortocaval lymph nodes, increased in size and metabolism. Representative left periaortic 1.5 cm node with max SUV 7.7 (series 3/image 171), previously 1.1 cm with max SUV 4.3. Representative 1.4 cm aortocaval node with max SUV 5.0 (series 3/image 179), new. Left adrenal 1.0 cm nodule with density 5 HU with low level FDG uptake with max SUV 2.7, not appreciably changed in size, density or metabolism, most compatible with a benign adenoma, for which no follow-up imaging is recommended. No abnormal hypermetabolic activity within the liver, pancreas, right adrenal gland, or spleen. No enlarged or hypermetabolic pelvic lymph nodes. Incidental CT findings: Atherosclerotic nonaneurysmal abdominal aorta. SKELETON: No focal hypermetabolic activity to suggest skeletal metastasis. Previously visualized hypermetabolic skeletal lesions in the sacrum, spine and right clavicle  have resolved on the PET images and demonstrate faint sclerosis on the CT images. Incidental CT findings: None. IMPRESSION: 1. Mixed interval response. 2. Hypermetabolic 5.5 cm upper left renal mass is slightly decreased in size and not substantially changed in metabolism, compatible with known primary renal cell  carcinoma. 3. Hypermetabolic retroperitoneal and right retrocrural nodal metastases are increased in size and metabolism. 4. Complete metabolic response of skeletal metastases. 5. Aortic Atherosclerosis (ICD10-I70.0) and Emphysema (ICD10-J43.9). Electronically Signed   By: Ilona Sorrel M.D.   On: 04/29/2022 11:00     ASSESSMENT:  Metastatic kidney cancer to the bones: - CT chest lung cancer screening scan (08/31/2021): Lung RADS 1S.  Possible soft tissue fullness in the upper pole of the left kidney. - US renal (09/15/2021): Mass with solid component in the upper pole of the left kidney measuring 6.1 cm, highly suspicious for RCC. - MRI of the abdomen (09/22/2021): 6.5 cm left kidney upper pole solid enhancing renal mass favoring RCC.  Scattered enhancing bone lesions suspicious for osseous metastatic disease, largest at L3 and in the central sacrum.  1.3 x 1.4 cm mass of the left adrenal gland has indeterminate characteristics by MRI but on CT chest seems to have a low-density favoring adenoma.  No tumor thrombus in the left renal vein. - PET scan (10/07/2021): Hypermetabolic left kidney mass with SUV 7.1.  Bilateral abdominal retroperitoneal hypermetabolic lymph nodes.  Multifocal bone metastasis.  Central sacrum, inferior aspect of L3 vertebral body, T8 body, C7 spinous process and medial right clavicle. 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  Biopsy shows a poorly differentiated neoplasm, IHC positive for CK8/18 (subset), CD10 and cytokeratin AE 1/3.  Cells are negative for CK20, S100, HMB45, SOX10, calretinin, P504S, prostein, PSA, desmin and CDX2. - I have talked to our pathologist Dr. Melina Copa who thought it is most likely clear-cell histology although IHC does not support it. - IMDC criteria: 1 risk factor with less than 1 year from time of diagnosis to systemic therapy.  Intermediate risk group - Opdivo and Yervoy cycle 1  started on 11/17/2021. 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  He is a potential candidate for belzutifan on further progression.    Social/family history: - His nephew lives with him.  He has a 60-year-old daughter who was given for adoption.  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  He is a current active smoker, 1 pack/day for close to 40 years.  He drinks 1 beer at night. - Maternal aunt had breast cancer.  Maternal cousin has prostate cancer and another maternal cousin had breast cancer.  Maternal cousin also had kidney removed, thought to be secondary to cancer.  Paternal grandfather had lung cancer.   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  He has gotten excellent response with complete resolution of bone lesions.  Left kidney mass is slightly decreased.  However left para-aortic lymph nodes have slightly increased with increase in SUV.  No new lymph nodes were seen. - Hence have recommended continuing immunotherapy at this time.  Will consider repeating PET scan in 2 months. - Reviewed labs today which showed normal LFTs and  CBC.  TSH was 1.014. - Proceed with nivolumab today.  RTC 4 weeks for follow-up with labs and treatment.   2.  Low back pain: - Continue Mobic and Flexeril as needed.  3.  Bone metastatic disease: - He is seeing a root canal specialist in [place]. - We will start denosumab after all the treatments done.  4.  Stiffness and pain in the hand joints and knees: - Immunotherapy related.  I have started him on prednisone 5 mg daily.  However he took it for 1 week with improvement in pains.  He is taking it on an as-needed basis.   Orders placed this encounter:   No orders of the defined types were placed in this encounter.    Derek Jack, MD [place] [phone]

## 2022-05-09 NOTE — Patient Instructions (Signed)
[facility]  Discharge Instructions: Thank you for choosing [facility] to provide your oncology and hematology care.  If you have a lab appointment with the [facility], please come in thru the Main Entrance and check in at the main information desk.  Wear comfortable clothing and clothing appropriate for easy access to any Portacath or PICC line.   We strive to give you quality time with your provider. You may need to reschedule your appointment if you arrive late (15 or more minutes).  Arriving late affects you and other patients whose appointments are after yours.  Also, if you miss three or more appointments without notifying the office, you may be dismissed from the clinic at the provider's discretion.      For prescription refill requests, have your pharmacy contact our office and allow 72 hours for refills to be completed.    Today you received the following chemotherapy and/or immunotherapy agents Opdivo.  Nivolumab Injection What is this medication? NIVOLUMAB (nye VOL ue mab) treats some types of cancer. It works by helping your immune system slow or stop the spread of cancer cells. It is a monoclonal antibody. This medicine may be used for other purposes; ask your health care provider or pharmacist if you have questions. COMMON BRAND NAME(S): Opdivo What should I tell my care team before I take this medication? They need to know if you have any of these conditions: Allogeneic stem cell transplant (uses someone else's stem cells) Autoimmune diseases, such as Crohn disease, ulcerative colitis, lupus History of chest radiation Nervous system problems, such as Guillain-Barre syndrome or myasthenia gravis Organ transplant An unusual or allergic reaction to nivolumab, other medications, foods, dyes, or preservatives Pregnant or trying to get pregnant Breast-feeding How should I use this medication? This medication is infused into a vein. It is  given in a hospital or clinic setting. A special MedGuide will be given to you before each treatment. Be sure to read this information carefully each time. Talk to your care team about the use of this medication in children. While it may be prescribed for children as young as 12 years for selected conditions, precautions do apply. Overdosage: If you think you have taken too much of this medicine contact a poison control center or emergency room at once. NOTE: This medicine is only for you. Do not share this medicine with others. What if I miss a dose? Keep appointments for follow-up doses. It is important not to miss your dose. Call your care team if you are unable to keep an appointment. What may interact with this medication? Interactions have not been studied. This list may not describe all possible interactions. Give your health care provider a list of all the medicines, herbs, non-prescription drugs, or dietary supplements you use. Also tell them if you smoke, drink alcohol, or use illegal drugs. Some items may interact with your medicine. What should I watch for while using this medication? Your condition will be monitored carefully while you are receiving this medication. You may need blood work while taking this medication. This medication may cause serious skin reactions. They can happen weeks to months after starting the medication. Contact your care team right away if you notice fevers or flu-like symptoms with a rash. The rash may be red or purple and then turn into blisters or peeling of the skin. You may also notice a red rash with swelling of the face, lips, or lymph nodes in your neck  or under your arms. Tell your care team right away if you have any change in your eyesight. Talk to your care team if you are pregnant or think you might be pregnant. A negative pregnancy test is required before starting this medication. A reliable form of contraception is recommended while taking this  medication and for 5 months after the last dose. Talk to your care team about effective forms of contraception. Do not breast-feed while taking this medication and for 5 months after the last dose. What side effects may I notice from receiving this medication? Side effects that you should report to your care team as soon as possible: Allergic reactions--skin rash, itching, hives, swelling of the face, lips, tongue, or throat Dry cough, shortness of breath or trouble breathing Eye pain, redness, irritation, or discharge with blurry or decreased vision Heart muscle inflammation--unusual weakness or fatigue, shortness of breath, chest pain, fast or irregular heartbeat, dizziness, swelling of the ankles, feet, or hands Hormone gland problems--headache, sensitivity to light, unusual weakness or fatigue, dizziness, fast or irregular heartbeat, increased sensitivity to cold or heat, excessive sweating, constipation, hair loss, increased thirst or amount of urine, tremors or shaking, irritability Infusion reactions--chest pain, shortness of breath or trouble breathing, feeling faint or lightheaded Kidney injury (glomerulonephritis)--decrease in the amount of urine, red or dark Narely Nobles urine, foamy or bubbly urine, swelling of the ankles, hands, or feet Liver injury--right upper belly pain, loss of appetite, nausea, light-colored stool, dark yellow or Jimmey Hengel urine, yellowing skin or eyes, unusual weakness or fatigue Pain, tingling, or numbness in the hands or feet, muscle weakness, change in vision, confusion or trouble speaking, loss of balance or coordination, trouble walking, seizures Rash, fever, and swollen lymph nodes Redness, blistering, peeling, or loosening of the skin, including inside the mouth Sudden or severe stomach pain, bloody diarrhea, fever, nausea, vomiting Side effects that usually do not require medical attention (report these to your care team if they continue or are bothersome): Bone,  joint, or muscle pain Diarrhea Fatigue Loss of appetite Nausea Skin rash This list may not describe all possible side effects. Call your doctor for medical advice about side effects. You may report side effects to FDA at 1-800-FDA-1088. Where should I keep my medication? This medication is given in a hospital or clinic. It will not be stored at home. NOTE: This sheet is a summary. It may not cover all possible information. If you have questions about this medicine, talk to your doctor, pharmacist, or health care provider.  2023 Elsevier/Gold Standard (2021-09-27 00:00:00)        To help prevent nausea and vomiting after your treatment, we encourage you to take your nausea medication as directed.  BELOW ARE SYMPTOMS THAT SHOULD BE REPORTED IMMEDIATELY: *FEVER GREATER THAN 100.4 F (38 C) OR HIGHER *CHILLS OR SWEATING *NAUSEA AND VOMITING THAT IS NOT CONTROLLED WITH YOUR NAUSEA MEDICATION *UNUSUAL SHORTNESS OF BREATH *UNUSUAL BRUISING OR BLEEDING *URINARY PROBLEMS (pain or burning when urinating, or frequent urination) *BOWEL PROBLEMS (unusual diarrhea, constipation, pain near the anus) TENDERNESS IN MOUTH AND THROAT WITH OR WITHOUT PRESENCE OF ULCERS (sore throat, sores in mouth, or a toothache) UNUSUAL RASH, SWELLING OR PAIN  UNUSUAL VAGINAL DISCHARGE OR ITCHING   Items with * indicate a potential emergency and should be followed up as soon as possible or go to the Emergency Department if any problems should occur.  Please show the CHEMOTHERAPY ALERT CARD or IMMUNOTHERAPY ALERT CARD at check-in to the Emergency Department and triage  nurse.  Should you have questions after your visit or need to cancel or reschedule your appointment, please contact [facility] [phone]  and follow the prompts.  Office hours are 8:00 a.m. to 4:30 p.m. Monday - Friday. Please note that voicemails left after 4:00 p.m. may not be returned until the following business day.  We are  closed weekends and major holidays. You have access to a nurse at all times for urgent questions. Please call the main number to the clinic [phone] and follow the prompts.  For any non-urgent questions, you may also contact your provider using MyChart. We now offer e-Visits for anyone 22 and older to request care online for non-urgent symptoms. For details visit [email or web address].   Also download the MyChart app! Go to the app store, search "MyChart", open the app, select [facility], and log in with your MyChart username and password.  Masks are optional in the cancer centers. If you would like for your care team to wear a mask while they are taking care of you, please let them know. You may have one support person who is at least 56 years old accompany you for your appointments.

## 2022-05-09 NOTE — Progress Notes (Signed)
Patient has been assessed, vital signs and labs have been reviewed by Dr. Katragadda. ANC, Creatinine, LFTs, and Platelets are within treatment parameters per Dr. Katragadda. The patient is good to proceed with treatment at this time. Primary RN and pharmacy aware.  

## 2022-05-23 ENCOUNTER — Telehealth: Payer: Self-pay | Admitting: Podiatry

## 2022-05-23 NOTE — Telephone Encounter (Signed)
No answer no vm tried to reach patient for orthotics  .

## 2022-05-27 ENCOUNTER — Ambulatory Visit (INDEPENDENT_AMBULATORY_CARE_PROVIDER_SITE_OTHER): Payer: Commercial Managed Care - PPO | Admitting: Podiatry

## 2022-05-27 DIAGNOSIS — M216X1 Other acquired deformities of right foot: Secondary | ICD-10-CM

## 2022-05-27 DIAGNOSIS — M722 Plantar fascial fibromatosis: Secondary | ICD-10-CM

## 2022-06-08 ENCOUNTER — Inpatient Hospital Stay (HOSPITAL_BASED_OUTPATIENT_CLINIC_OR_DEPARTMENT_OTHER): Payer: Commercial Managed Care - PPO | Admitting: Hematology

## 2022-06-08 ENCOUNTER — Other Ambulatory Visit: Payer: Self-pay | Admitting: *Deleted

## 2022-06-08 ENCOUNTER — Inpatient Hospital Stay: Payer: Commercial Managed Care - PPO

## 2022-06-08 ENCOUNTER — Inpatient Hospital Stay: Payer: Commercial Managed Care - PPO | Attending: Hematology

## 2022-06-08 VITALS — BP 140/79 | HR 66 | Temp 98.0°F | Resp 18

## 2022-06-08 DIAGNOSIS — Z79899 Other long term (current) drug therapy: Secondary | ICD-10-CM | POA: Diagnosis not present

## 2022-06-08 DIAGNOSIS — C649 Malignant neoplasm of unspecified kidney, except renal pelvis: Secondary | ICD-10-CM | POA: Diagnosis not present

## 2022-06-08 DIAGNOSIS — C7951 Secondary malignant neoplasm of bone: Secondary | ICD-10-CM

## 2022-06-08 DIAGNOSIS — Z95828 Presence of other vascular implants and grafts: Secondary | ICD-10-CM

## 2022-06-08 DIAGNOSIS — Z5112 Encounter for antineoplastic immunotherapy: Secondary | ICD-10-CM | POA: Diagnosis present

## 2022-06-08 LAB — CBC WITH DIFFERENTIAL/PLATELET
Abs Immature Granulocytes: 0.03 10*3/uL (ref 0.00–0.07)
Basophils Absolute: 0.1 10*3/uL (ref 0.0–0.1)
Basophils Relative: 0 %
Eosinophils Absolute: 0.3 10*3/uL (ref 0.0–0.5)
Eosinophils Relative: 2 %
HCT: 40.7 % (ref 39.0–52.0)
Hemoglobin: 13.1 g/dL (ref 13.0–17.0)
Immature Granulocytes: 0 %
Lymphocytes Relative: 16 %
Lymphs Abs: 1.8 10*3/uL (ref 0.7–4.0)
MCH: 28.9 pg (ref 26.0–34.0)
MCHC: 32.2 g/dL (ref 30.0–36.0)
MCV: 89.8 fL (ref 80.0–100.0)
Monocytes Absolute: 0.9 10*3/uL (ref 0.1–1.0)
Monocytes Relative: 8 %
Neutro Abs: 8.4 10*3/uL — ABNORMAL HIGH (ref 1.7–7.7)
Neutrophils Relative %: 74 %
Platelets: 242 10*3/uL (ref 150–400)
RBC: 4.53 MIL/uL (ref 4.22–5.81)
RDW: 13.3 % (ref 11.5–15.5)
WBC: 11.5 10*3/uL — ABNORMAL HIGH (ref 4.0–10.5)
nRBC: 0 % (ref 0.0–0.2)

## 2022-06-08 LAB — COMPREHENSIVE METABOLIC PANEL
ALT: 19 U/L (ref 0–44)
AST: 19 U/L (ref 15–41)
Albumin: 3.4 g/dL — ABNORMAL LOW (ref 3.5–5.0)
Alkaline Phosphatase: 76 U/L (ref 38–126)
Anion gap: 9 (ref 5–15)
BUN: 25 mg/dL — ABNORMAL HIGH (ref 6–20)
CO2: 26 mmol/L (ref 22–32)
Calcium: 9 mg/dL (ref 8.9–10.3)
Chloride: 105 mmol/L (ref 98–111)
Creatinine, Ser: 1.15 mg/dL (ref 0.61–1.24)
GFR, Estimated: >60 mL/min (ref >60)
Glucose, Bld: 88 mg/dL (ref 70–99)
Potassium: 4.3 mmol/L (ref 3.5–5.1)
Sodium: 140 mmol/L (ref 135–145)
Total Bilirubin: 0.2 mg/dL — ABNORMAL LOW (ref 0.3–1.2)
Total Protein: 7.3 g/dL (ref 6.5–8.1)

## 2022-06-08 LAB — TSH: TSH: 1.362 u[IU]/mL (ref 0.350–4.500)

## 2022-06-08 MED ORDER — SODIUM CHLORIDE 0.9% FLUSH
10.0000 mL | INTRAVENOUS | Status: DC | PRN
  Administered 2022-06-08: 10 mL

## 2022-06-08 MED ORDER — HEPARIN SOD (PORK) LOCK FLUSH 100 UNIT/ML IV SOLN
500.0000 [IU] | Freq: Once | INTRAVENOUS | Status: AC | PRN
  Administered 2022-06-08: 500 [IU]

## 2022-06-08 MED ORDER — SODIUM CHLORIDE 0.9 % IV SOLN
480.0000 mg | Freq: Once | INTRAVENOUS | Status: AC
  Administered 2022-06-08: 480 mg via INTRAVENOUS
  Filled 2022-06-08: qty 48

## 2022-06-08 MED ORDER — SODIUM CHLORIDE 0.9 % IV SOLN: Freq: Once | INTRAVENOUS | Status: AC

## 2022-06-08 NOTE — Progress Notes (Signed)
Patient presents today for Opdivo infusion per providers order.  Vital signs and labs within parameters for treatment.  Patient has no new complaints at this time.  Treatment given today per MD orders.  Stable during infusion without adverse affects.  Vital signs stable.  No complaints at this time.  Discharge from clinic ambulatory in stable condition.  Alert and oriented X 3.  Follow up with [facility] as scheduled.

## 2022-06-08 NOTE — Patient Instructions (Signed)
[facility] at [facility] Discharge Instructions   You were seen and examined today by Dr. Delton Coombes.  He reviewed your lab results which are normal/stable.   We will proceed with your treatment today.  Return as scheduled in 4 weeks.    Thank you for choosing [facility] at [facility] to provide your oncology and hematology care.  To afford each patient quality time with our provider, please arrive at least 15 minutes before your scheduled appointment time.   If you have a lab appointment with the [facility] please come in thru the Main Entrance and check in at the main information desk.  You need to re-schedule your appointment should you arrive 10 or more minutes late.  We strive to give you quality time with our providers, and arriving late affects you and other patients whose appointments are after yours.  Also, if you no show three or more times for appointments you may be dismissed from the clinic at the providers discretion.     Again, thank you for choosing [facility].  Our hope is that these requests will decrease the amount of time that you wait before being seen by our physicians.       _____________________________________________________________  Should you have questions after your visit to [facility], please contact our office at [phone] and follow the prompts.  Our office hours are 8:00 a.m. and 4:30 p.m. Monday - Friday.  Please note that voicemails left after 4:00 p.m. may not be returned until the following business day.  We are closed weekends and major holidays.  You do have access to a nurse 24-7, just call the main number to the clinic [phone] and do not press any options, hold on the line and a nurse will answer the phone.    For prescription refill requests, have your pharmacy contact our office and allow 72 hours.    Due to Covid, you will need to wear a mask upon entering the  hospital. If you do not have a mask, a mask will be given to you at the Main Entrance upon arrival. For doctor visits, patients may have 1 support person age 99 or older with them. For treatment visits, patients can not have anyone with them due to social distancing guidelines and our immunocompromised population.

## 2022-06-08 NOTE — Progress Notes (Signed)
Patient has been examined by Dr. Katragadda, and vital signs and labs have been reviewed. ANC, Creatinine, LFTs, hemoglobin, and platelets are within treatment parameters per M.D. - pt may proceed with treatment.  Primary RN and pharmacy notified.  

## 2022-06-08 NOTE — Patient Instructions (Signed)
[facility]  Discharge Instructions: Thank you for choosing [facility] to provide your oncology and hematology care.  If you have a lab appointment with the Cancer Center, please come in thru the Main Entrance and check in at the main information desk.  Wear comfortable clothing and clothing appropriate for easy access to any Portacath or PICC line.   We strive to give you quality time with your provider. You may need to reschedule your appointment if you arrive late (15 or more minutes).  Arriving late affects you and other patients whose appointments are after yours.  Also, if you miss three or more appointments without notifying the office, you may be dismissed from the clinic at the provider's discretion.      For prescription refill requests, have your pharmacy contact our office and allow 72 hours for refills to be completed.    Today you received the following chemotherapy and/or immunotherapy agents Opdivo      To help prevent nausea and vomiting after your treatment, we encourage you to take your nausea medication as directed.  BELOW ARE SYMPTOMS THAT SHOULD BE REPORTED IMMEDIATELY: *FEVER GREATER THAN 100.4 F (38 C) OR HIGHER *CHILLS OR SWEATING *NAUSEA AND VOMITING THAT IS NOT CONTROLLED WITH YOUR NAUSEA MEDICATION *UNUSUAL SHORTNESS OF BREATH *UNUSUAL BRUISING OR BLEEDING *URINARY PROBLEMS (pain or burning when urinating, or frequent urination) *BOWEL PROBLEMS (unusual diarrhea, constipation, pain near the anus) TENDERNESS IN MOUTH AND THROAT WITH OR WITHOUT PRESENCE OF ULCERS (sore throat, sores in mouth, or a toothache) UNUSUAL RASH, SWELLING OR PAIN  UNUSUAL VAGINAL DISCHARGE OR ITCHING   Items with * indicate a potential emergency and should be followed up as soon as possible or go to the Emergency Department if any problems should occur.  Please show the CHEMOTHERAPY ALERT CARD or IMMUNOTHERAPY ALERT CARD at check-in to the Emergency  Department and triage nurse.  Should you have questions after your visit or need to cancel or reschedule your appointment, please contact [facility] AT  [phone]  and follow the prompts.  Office hours are 8:00 a.m. to 4:30 p.m. Monday - Friday. Please note that voicemails left after 4:00 p.m. may not be returned until the following business day.  We are closed weekends and major holidays. You have access to a nurse at all times for urgent questions. Please call the main number to the clinic [phone] and follow the prompts.  For any non-urgent questions, you may also contact your provider using MyChart. We now offer e-Visits for anyone 18 and older to request care online for non-urgent symptoms. For details visit [email or web address].   Also download the MyChart app! Go to the app store, search "MyChart", open the app, select [facility], and log in with your MyChart username and password.   

## 2022-06-08 NOTE — Progress Notes (Signed)
Patients port flushed without difficulty.  Good blood return noted with no bruising or swelling noted at site.  Stable during access and blood draw.  Patient to remain accessed for treatment. 

## 2022-06-08 NOTE — Progress Notes (Signed)
[place], [place] [address]   CLINIC:  Medical Oncology/Hematology  PCP:  [name], [place] / [name] [address] [phone]   REASON FOR VISIT:  Follow-up for metastatic kidney cancer  PRIOR THERAPY: none  NGS Results: not done  CURRENT THERAPY: Nivolumab + Ipilimumab q21d / Nivolumab q28d  BRIEF ONCOLOGIC HISTORY:  Oncology History  Metastatic renal cell carcinoma to bone ([facility])  11/04/2021 Initial Diagnosis   Metastatic renal cell carcinoma to bone ([facility])   11/17/2021 - 02/10/2022 Chemotherapy   Patient is on Treatment Plan : RENAL CELL CARCINOMA Nivolumab + Ipilimumab q21d / Nivolumab q28d     11/17/2021 -  Chemotherapy   Patient is on Treatment Plan : RENAL CELL CARCINOMA Nivolumab (3) + Ipilimumab (1) q21d x 4 cycles  / Nivolumab (480) q28d       CANCER STAGING:  Cancer Staging  Metastatic renal cell carcinoma to bone [facility]) Staging form: Kidney, AJCC 8th Edition - Clinical stage from 11/04/2021: Stage IV (cT1b, cN1, pM1) - Unsigned   INTERVAL HISTORY:  Jacob Reynolds, a 56 y.o. male, seen for follow-up of metastatic kidney cancer and toxicity assessment prior to next cycle of nivolumab.  He reports energy levels are 75%.  Denies any immunotherapy related side effects.  Joint pains in the small joints of the hands did not bother him after last treatment.  However he had mouth sores which lasted couple of days after last treatment.  REVIEW OF SYSTEMS:  Review of Systems  Constitutional:  Negative for appetite change and fatigue.  Cardiovascular:  Negative for leg swelling.  Neurological:  Positive for headaches.  All other systems reviewed and are negative.   PAST MEDICAL/SURGICAL HISTORY:  Past Medical History:  Diagnosis Date   Plantar fasciitis    Port-A-Cath in place 11/10/2021   Past Surgical History:  Procedure Laterality Date   IR IMAGING GUIDED PORT INSERTION  10/15/2021    SOCIAL HISTORY:   Social History   Socioeconomic History   Marital status: Single    Spouse name: Not on file   Number of children: 1   Years of education: 11   Highest education level: 11th grade  Occupational History   Not on file  Tobacco Use   Smoking status: Every Day    Packs/day: 1.00    Years: 36.00    Total pack years: 36.00    Types: Cigarettes    Start date: 06/13/1978   Smokeless tobacco: Never  Substance and Sexual Activity   Alcohol use: Yes    Alcohol/week: 7.0 standard drinks of alcohol    Types: 7 Cans of beer per week   Drug use: Yes    Frequency: 1.0 times per week    Types: Marijuana   Sexual activity: Not on file  Other Topics Concern   Not on file  Social History Narrative   Not on file   Social Determinants of Health   Financial Resource Strain: Not on file  Food Insecurity: Not on file  Transportation Needs: Not on file  Physical Activity: Not on file  Stress: Not on file  Social Connections: Not on file  Intimate Partner Violence: Not on file    FAMILY HISTORY:  Family History  Problem Relation Age of Onset   Hypertension Mother    Parkinson's disease Father    Breast cancer Maternal Aunt        dx >50   Lung cancer  Paternal Grandfather    Breast cancer Cousin        dx 24s   Prostate cancer Cousin        dx 33s    CURRENT MEDICATIONS:  Current Outpatient Medications  Medication Sig Dispense Refill   ciclopirox (PENLAC) 8 % solution Apply topically at bedtime. Apply over nail and surrounding skin. Apply daily over previous coat. After seven (7) days, may remove with alcohol and continue cycle. 6.6 mL 0   cyclobenzaprine (FLEXERIL) 10 MG tablet Take 1 tablet (10 mg total) by mouth 3 (three) times daily as needed for muscle spasms. 90 tablet 1   meloxicam (MOBIC) 15 MG tablet Take 1 tablet (15 mg total) by mouth daily as needed for pain. 30 tablet 0   Nivolumab (OPDIVO IV) Inject into the vein every 21 ( twenty-one) days. Every 21 days x 4 cycles,  then every 28 days     predniSONE (DELTASONE) 5 MG tablet Take 1 tablet (5 mg total) by mouth daily with breakfast. 30 tablet 2   predniSONE (DELTASONE) 50 MG tablet Take one tablet by mouth 13 hours, 7 hours, and 1 hour prior prior to CT scan 3 tablet 0   No current facility-administered medications for this visit.   Facility-Administered Medications Ordered in Other Visits  Medication Dose Route Frequency Provider Last Rate Last Admin   diphenhydrAMINE (BENADRYL) 50 MG/ML injection            famotidine (PEPCID) 20-0.9 MG/50ML-% IVPB            heparin lock flush 100 unit/mL  500 Units Intracatheter Once PRN Derek Jack, MD       nivolumab (OPDIVO) 480 mg in sodium chloride 0.9 % 100 mL chemo infusion  480 mg Intravenous Once Derek Jack, MD 296 mL/hr at 06/08/22 1254 480 mg at 06/08/22 1254   sodium chloride flush (NS) 0.9 % injection 10 mL  10 mL Intracatheter PRN Derek Jack, MD        ALLERGIES:  Allergies  Allergen Reactions   Iodine     Throat felt like closed Was in hospital for allergy and almost died- per patient     PHYSICAL EXAM:  Performance status (ECOG): 0 - Asymptomatic  There were no vitals filed for this visit.  Wt Readings from Last 3 Encounters:  06/08/22 184 lb 12.8 oz (83.8 kg)  05/09/22 184 lb 3.2 oz (83.6 kg)  04/07/22 174 lb (78.9 kg)   Physical Exam Vitals reviewed.  Constitutional:      Appearance: Normal appearance.  Cardiovascular:     Rate and Rhythm: Normal rate and regular rhythm.     Pulses: Normal pulses.     Heart sounds: Normal heart sounds.  Pulmonary:     Effort: Pulmonary effort is normal.     Breath sounds: Normal breath sounds.  Neurological:     General: No focal deficit present.     Mental Status: He is alert and oriented to person, place, and time.  Psychiatric:        Mood and Affect: Mood normal.        Behavior: Behavior normal.     LABORATORY DATA:  I have reviewed the labs as listed.      Latest Ref Rng & Units 06/08/2022   10:23 AM 05/09/2022    9:17 AM 04/07/2022    9:33 AM  CBC  WBC 4.0 - 10.5 K/uL 11.5  10.2  12.5   Hemoglobin 13.0 - 17.0 g/dL  13.1  13.6  14.6   Hematocrit 39.0 - 52.0 % 40.7  41.9  44.4   Platelets 150 - 400 K/uL 242  182  211       Latest Ref Rng & Units 06/08/2022   10:23 AM 05/09/2022    9:17 AM 04/07/2022    9:33 AM  CMP  Glucose 70 - 99 mg/dL 88  121  106   BUN 6 - 20 mg/dL _0 Creatinine 0.61 - 1.24 mg/dL 1.15  0.87  0.90   Sodium 135 - 145 mmol/L 140  138  140   Potassium 3.5 - 5.1 mmol/L 4.3  3.9  4.1   Chloride 98 - 111 mmol/L 105  105  105   CO2 22 - 32 mmol/L _1 Calcium 8.9 - 10.3 mg/dL 9.0  8.5  8.9   Total Protein 6.5 - 8.1 g/dL 7.3  6.8  7.5   Total Bilirubin 0.3 - 1.2 mg/dL 0.2  0.3  0.3   Alkaline Phos 38 - 126 U/L 76  84  86   AST 15 - 41 U/L _2 ALT 0 - 44 U/L _3 DIAGNOSTIC IMAGING:  I have independently reviewed the scans and discussed with the patient. No results found.   ASSESSMENT:  Metastatic kidney cancer to the bones: - CT chest lung cancer screening scan (08/31/2021): Lung RADS 1S.  Possible soft tissue fullness in the upper pole of the left kidney. - US renal (09/15/2021): Mass with solid component in the upper pole of the left kidney measuring 6.1 cm, highly suspicious for RCC. - MRI of the abdomen (09/22/2021): 6.5 cm left kidney upper pole solid enhancing renal mass favoring RCC.  Scattered enhancing bone lesions suspicious for osseous metastatic disease, largest at L3 and in the central sacrum.  1.3 x 1.4 cm mass of the left adrenal gland has indeterminate characteristics by MRI but on CT chest seems to have a low-density favoring adenoma.  No tumor thrombus in the left renal vein. - PET scan (10/07/2021): Hypermetabolic left kidney mass with SUV 7.1.  Bilateral abdominal retroperitoneal hypermetabolic lymph nodes.  Multifocal bone metastasis.  Central sacrum, inferior  aspect of L3 vertebral body, T8 body, C7 spinous process and medial right clavicle. 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  Biopsy shows a poorly differentiated neoplasm, IHC positive for CK8/18 (subset), CD10 and cytokeratin AE 1/3.  Cells are negative for CK20, S100, HMB45, SOX10, calretinin, P504S, prostein, PSA, desmin and CDX2. - I have talked to our pathologist Dr. Melina Copa who thought it is most likely clear-cell histology although IHC does not support it. - IMDC criteria: 1 risk factor with less than 1 year from time of diagnosis to systemic therapy.  Intermediate risk group - Opdivo and Yervoy cycle 1 started on 11/17/2021. 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  He is a potential candidate for belzutifan on further progression.    Social/family history: - His nephew lives with him.  He has a 77-year-old daughter who was given for adoption.  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  He is a current active smoker, 1 pack/day for close to 40 years.  He drinks 1 beer at night. - Maternal aunt had breast cancer.  Maternal cousin has prostate cancer and another maternal cousin had breast cancer.  Maternal cousin also had kidney removed, thought to be secondary to cancer.  Paternal grandfather had lung cancer.   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  Left kidney mass is slightly decreased.  Left para-aortic lymph nodes have slightly increased with increase in SUV.  No new lymph nodes. - He has tolerated last treatment very well except mouth sores which lasted about 2 days. - Reviewed labs today which showed normal LFTs and creatinine.  CBC was grossly normal.  TSH  is 1.3. - Proceed with Opdivo today.  RTC 4 weeks for follow-up.  I plan to repeat PET CT scan after next cycle of Opdivo.  2.  Low back pain: -Continue Mobic and Flexeril as needed.  3.  Bone metastatic disease: -He is having root canal treatments done.  We will start him on denosumab after the treatments are done.  4.  Stiffness and pain in the hand joints and knees: -Use prednisone 5 mg daily as needed.  He did not require any after last treatment.   Orders placed this encounter:  Orders Placed This Encounter  Procedures   CBC with Differential   Comprehensive metabolic panel   T4   TSH   CBC with Differential   Comprehensive metabolic panel   T4   TSH   CBC with Differential   Comprehensive metabolic panel   T4   TSH   CBC with Differential   Comprehensive metabolic panel   T4   TSH   CBC with Differential   Comprehensive metabolic panel   T4   TSH      Derek Jack, MD [place] [phone]

## 2022-06-09 LAB — T4: T4, Total: 8.1 ug/dL (ref 4.5–12.0)

## 2022-06-28 ENCOUNTER — Other Ambulatory Visit: Payer: Self-pay

## 2022-07-04 ENCOUNTER — Inpatient Hospital Stay: Payer: Commercial Managed Care - PPO | Attending: Hematology | Admitting: Hematology

## 2022-07-04 ENCOUNTER — Encounter: Payer: Self-pay | Admitting: Hematology

## 2022-07-04 ENCOUNTER — Inpatient Hospital Stay: Payer: Commercial Managed Care - PPO

## 2022-07-04 VITALS — BP 135/84 | HR 71 | Temp 99.2°F | Resp 16

## 2022-07-04 DIAGNOSIS — Z8042 Family history of malignant neoplasm of prostate: Secondary | ICD-10-CM | POA: Insufficient documentation

## 2022-07-04 DIAGNOSIS — Z803 Family history of malignant neoplasm of breast: Secondary | ICD-10-CM | POA: Insufficient documentation

## 2022-07-04 DIAGNOSIS — Z5112 Encounter for antineoplastic immunotherapy: Secondary | ICD-10-CM | POA: Diagnosis present

## 2022-07-04 DIAGNOSIS — C7951 Secondary malignant neoplasm of bone: Secondary | ICD-10-CM

## 2022-07-04 DIAGNOSIS — C642 Malignant neoplasm of left kidney, except renal pelvis: Secondary | ICD-10-CM | POA: Insufficient documentation

## 2022-07-04 DIAGNOSIS — Z95828 Presence of other vascular implants and grafts: Secondary | ICD-10-CM

## 2022-07-04 DIAGNOSIS — N2889 Other specified disorders of kidney and ureter: Secondary | ICD-10-CM

## 2022-07-04 DIAGNOSIS — C649 Malignant neoplasm of unspecified kidney, except renal pelvis: Secondary | ICD-10-CM | POA: Diagnosis not present

## 2022-07-04 DIAGNOSIS — Z801 Family history of malignant neoplasm of trachea, bronchus and lung: Secondary | ICD-10-CM | POA: Insufficient documentation

## 2022-07-04 DIAGNOSIS — M545 Low back pain, unspecified: Secondary | ICD-10-CM | POA: Insufficient documentation

## 2022-07-04 DIAGNOSIS — Z79899 Other long term (current) drug therapy: Secondary | ICD-10-CM | POA: Insufficient documentation

## 2022-07-04 DIAGNOSIS — F17210 Nicotine dependence, cigarettes, uncomplicated: Secondary | ICD-10-CM | POA: Diagnosis not present

## 2022-07-04 DIAGNOSIS — M899 Disorder of bone, unspecified: Secondary | ICD-10-CM

## 2022-07-04 LAB — COMPREHENSIVE METABOLIC PANEL
ALT: 19 U/L (ref 0–44)
AST: 15 U/L (ref 15–41)
Albumin: 3.1 g/dL — ABNORMAL LOW (ref 3.5–5.0)
Alkaline Phosphatase: 69 U/L (ref 38–126)
Anion gap: 8 (ref 5–15)
BUN: 23 mg/dL — ABNORMAL HIGH (ref 6–20)
CO2: 25 mmol/L (ref 22–32)
Calcium: 8.4 mg/dL — ABNORMAL LOW (ref 8.9–10.3)
Chloride: 101 mmol/L (ref 98–111)
Creatinine, Ser: 1.28 mg/dL — ABNORMAL HIGH (ref 0.61–1.24)
GFR, Estimated: >60 mL/min (ref >60)
Glucose, Bld: 95 mg/dL (ref 70–99)
Potassium: 3.8 mmol/L (ref 3.5–5.1)
Sodium: 134 mmol/L — ABNORMAL LOW (ref 135–145)
Total Bilirubin: 0.3 mg/dL (ref 0.3–1.2)
Total Protein: 7.8 g/dL (ref 6.5–8.1)

## 2022-07-04 LAB — CBC WITH DIFFERENTIAL/PLATELET
Abs Immature Granulocytes: 0.03 10*3/uL (ref 0.00–0.07)
Basophils Absolute: 0.1 10*3/uL (ref 0.0–0.1)
Basophils Relative: 1 %
Eosinophils Absolute: 0.3 10*3/uL (ref 0.0–0.5)
Eosinophils Relative: 3 %
HCT: 39.9 % (ref 39.0–52.0)
Hemoglobin: 12.8 g/dL — ABNORMAL LOW (ref 13.0–17.0)
Immature Granulocytes: 0 %
Lymphocytes Relative: 15 %
Lymphs Abs: 1.7 10*3/uL (ref 0.7–4.0)
MCH: 28.3 pg (ref 26.0–34.0)
MCHC: 32.1 g/dL (ref 30.0–36.0)
MCV: 88.1 fL (ref 80.0–100.0)
Monocytes Absolute: 1.2 10*3/uL — ABNORMAL HIGH (ref 0.1–1.0)
Monocytes Relative: 10 %
Neutro Abs: 8.4 10*3/uL — ABNORMAL HIGH (ref 1.7–7.7)
Neutrophils Relative %: 71 %
Platelets: 270 10*3/uL (ref 150–400)
RBC: 4.53 MIL/uL (ref 4.22–5.81)
RDW: 13.7 % (ref 11.5–15.5)
WBC: 11.6 10*3/uL — ABNORMAL HIGH (ref 4.0–10.5)
nRBC: 0 % (ref 0.0–0.2)

## 2022-07-04 LAB — TSH: TSH: 0.94 u[IU]/mL (ref 0.350–4.500)

## 2022-07-04 LAB — MAGNESIUM: Magnesium: 2.2 mg/dL (ref 1.7–2.4)

## 2022-07-04 MED ORDER — SODIUM CHLORIDE 0.9 % IV SOLN
480.0000 mg | Freq: Once | INTRAVENOUS | Status: AC
  Administered 2022-07-04: 480 mg via INTRAVENOUS
  Filled 2022-07-04: qty 48

## 2022-07-04 MED ORDER — SODIUM CHLORIDE 0.9 % IV SOLN: Freq: Once | INTRAVENOUS | Status: AC

## 2022-07-04 MED ORDER — HEPARIN SOD (PORK) LOCK FLUSH 100 UNIT/ML IV SOLN
500.0000 [IU] | Freq: Once | INTRAVENOUS | Status: AC | PRN
  Administered 2022-07-04: 500 [IU]

## 2022-07-04 MED ORDER — SODIUM CHLORIDE 0.9% FLUSH
10.0000 mL | INTRAVENOUS | Status: DC | PRN
  Administered 2022-07-04: 10 mL

## 2022-07-04 MED ORDER — SODIUM CHLORIDE 0.9% FLUSH
10.0000 mL | Freq: Once | INTRAVENOUS | Status: AC
  Administered 2022-07-04: 10 mL via INTRAVENOUS

## 2022-07-04 NOTE — Progress Notes (Signed)
Patient has been examined by Dr. Katragadda, and vital signs and labs have been reviewed. ANC, Creatinine, LFTs, hemoglobin, and platelets are within treatment parameters per M.D. - pt may proceed with treatment.  Primary RN and pharmacy notified.  

## 2022-07-04 NOTE — Progress Notes (Signed)
[place], [place] [address]   CLINIC:  Medical Oncology/Hematology  PCP:  Jacob Reynolds, [place] / [name] [address] [phone]   REASON FOR VISIT:  Follow-up for metastatic kidney cancer  PRIOR THERAPY: none  NGS Results: not done  CURRENT THERAPY: Nivolumab + Ipilimumab q21d / Nivolumab q28d  BRIEF ONCOLOGIC HISTORY:  Oncology History  Metastatic renal cell carcinoma to bone ([facility])  11/04/2021 Initial Diagnosis   Metastatic renal cell carcinoma to bone ([facility])   11/17/2021 - 02/10/2022 Chemotherapy   Patient is on Treatment Plan : RENAL CELL CARCINOMA Nivolumab + Ipilimumab q21d / Nivolumab q28d     11/17/2021 -  Chemotherapy   Patient is on Treatment Plan : RENAL CELL CARCINOMA Nivolumab (3) + Ipilimumab (1) q21d x 4 cycles  / Nivolumab (480) q28d       CANCER STAGING:  Cancer Staging  Metastatic renal cell carcinoma to bone [facility]) Staging form: Kidney, AJCC 8th Edition - Clinical stage from 11/04/2021: Stage IV (cT1b, cN1, pM1) - Unsigned   INTERVAL HISTORY:  Jacob Reynolds, a 57 y.o. male, seen for follow-up of metastatic kidney cancer and toxicity assessment prior to next cycle of nivolumab.  Energy levels are 85%.  He had some cough last week.  He had to take prednisone last Monday and Tuesday due to joint pains which improved.  Chronic headaches are stable.  REVIEW OF SYSTEMS:  Review of Systems  Constitutional:  Negative for appetite change and fatigue.  Cardiovascular:  Negative for leg swelling.  Neurological:  Positive for headaches.  All other systems reviewed and are negative.   PAST MEDICAL/SURGICAL HISTORY:  Past Medical History:  Diagnosis Date   Plantar fasciitis    Port-A-Cath in place 11/10/2021   Past Surgical History:  Procedure Laterality Date   IR IMAGING GUIDED PORT INSERTION  10/15/2021    SOCIAL HISTORY:  Social History   Socioeconomic History   Marital status: Single     Spouse name: Not on file   Number of children: 1   Years of education: 11   Highest education level: 11th grade  Occupational History   Not on file  Tobacco Use   Smoking status: Every Day    Packs/day: 1.00    Years: 36.00    Total pack years: 36.00    Types: Cigarettes    Start date: 06/13/1978   Smokeless tobacco: Never  Substance and Sexual Activity   Alcohol use: Yes    Alcohol/week: 7.0 standard drinks of alcohol    Types: 7 Cans of beer per week   Drug use: Yes    Frequency: 1.0 times per week    Types: Marijuana   Sexual activity: Not on file  Other Topics Concern   Not on file  Social History Narrative   Not on file   Social Determinants of Health   Financial Resource Strain: Not on file  Food Insecurity: Not on file  Transportation Needs: Not on file  Physical Activity: Not on file  Stress: Not on file  Social Connections: Not on file  Intimate Partner Violence: Not on file    FAMILY HISTORY:  Family History  Problem Relation Age of Onset   Hypertension Mother    Parkinson's disease Father    Breast cancer Maternal Aunt        dx >50   Lung cancer Paternal Grandfather    Breast cancer Cousin  dx 69s   Prostate cancer Cousin        dx 67s    CURRENT MEDICATIONS:  Current Outpatient Medications  Medication Sig Dispense Refill   ciclopirox (PENLAC) 8 % solution Apply topically at bedtime. Apply over nail and surrounding skin. Apply daily over previous coat. After seven (7) days, may remove with alcohol and continue cycle. 6.6 mL 0   cyclobenzaprine (FLEXERIL) 10 MG tablet Take 1 tablet (10 mg total) by mouth 3 (three) times daily as needed for muscle spasms. 90 tablet 1   meloxicam (MOBIC) 15 MG tablet Take 1 tablet (15 mg total) by mouth daily as needed for pain. 30 tablet 0   Nivolumab (OPDIVO IV) Inject into the vein every 21 ( twenty-one) days. Every 21 days x 4 cycles, then every 28 days     predniSONE (DELTASONE) 5 MG tablet Take 1 tablet (5  mg total) by mouth daily with breakfast. 30 tablet 2   predniSONE (DELTASONE) 50 MG tablet Take one tablet by mouth 13 hours, 7 hours, and 1 hour prior prior to CT scan 3 tablet 0   No current facility-administered medications for this visit.   Facility-Administered Medications Ordered in Other Visits  Medication Dose Route Frequency Provider Last Rate Last Admin   diphenhydrAMINE (BENADRYL) 50 MG/ML injection            famotidine (PEPCID) 20-0.9 MG/50ML-% IVPB             ALLERGIES:  Allergies  Allergen Reactions   Iodine     Throat felt like closed Was in hospital for allergy and almost died- per patient     PHYSICAL EXAM:  Performance status (ECOG): 0 - Asymptomatic  There were no vitals filed for this visit.  Wt Readings from Last 3 Encounters:  07/04/22 179 lb 1.6 oz (81.2 kg)  06/08/22 184 lb 12.8 oz (83.8 kg)  05/09/22 184 lb 3.2 oz (83.6 kg)   Physical Exam Vitals reviewed.  Constitutional:      Appearance: Normal appearance.  Cardiovascular:     Rate and Rhythm: Normal rate and regular rhythm.     Pulses: Normal pulses.     Heart sounds: Normal heart sounds.  Pulmonary:     Effort: Pulmonary effort is normal.     Breath sounds: Normal breath sounds.  Neurological:     General: No focal deficit present.     Mental Status: He is alert and oriented to person, place, and time.  Psychiatric:        Mood and Affect: Mood normal.        Behavior: Behavior normal.    LABORATORY DATA:  I have reviewed the labs as listed.     Latest Ref Rng & Units 07/04/2022   11:43 AM 06/08/2022   10:23 AM 05/09/2022    9:17 AM  CBC  WBC 4.0 - 10.5 K/uL 11.6  11.5  10.2   Hemoglobin 13.0 - 17.0 g/dL 12.8  13.1  13.6   Hematocrit 39.0 - 52.0 % 39.9  40.7  41.9   Platelets 150 - 400 K/uL 270  242  182       Latest Ref Rng & Units 07/04/2022   11:43 AM 06/08/2022   10:23 AM 05/09/2022    9:17 AM  CMP  Glucose 70 - 99 mg/dL 95  88  121   BUN 6 - 20 mg/dL '23  25  23    '$ Creatinine 0.61 - 1.24 mg/dL 1.28  1.15  0.87   Sodium 135 - 145 mmol/L 134  140  138   Potassium 3.5 - 5.1 mmol/L 3.8  4.3  3.9   Chloride 98 - 111 mmol/L 101  105  105   CO2 22 - 32 mmol/L '25  26  25   '$ Calcium 8.9 - 10.3 mg/dL 8.4  9.0  8.5   Total Protein 6.5 - 8.1 g/dL 7.8  7.3  6.8   Total Bilirubin 0.3 - 1.2 mg/dL 0.3  0.2  0.3   Alkaline Phos 38 - 126 U/L 69  76  84   AST 15 - 41 U/L '15  19  22   '$ ALT 0 - 44 U/L '19  19  21     '$ DIAGNOSTIC IMAGING:  I have independently reviewed the scans and discussed with the patient. No results found.   ASSESSMENT:  Metastatic kidney cancer to the bones: - CT chest lung cancer screening scan (08/31/2021): Lung RADS 1S.  Possible soft tissue fullness in the upper pole of the left kidney. - US renal (09/15/2021): Mass with solid component in the upper pole of the left kidney measuring 6.1 cm, highly suspicious for RCC. - MRI of the abdomen (09/22/2021): 6.5 cm left kidney upper pole solid enhancing renal mass favoring RCC.  Scattered enhancing bone lesions suspicious for osseous metastatic disease, largest at L3 and in the central sacrum.  1.3 x 1.4 cm mass of the left adrenal gland has indeterminate characteristics by MRI but on CT chest seems to have a low-density favoring adenoma.  No tumor thrombus in the left renal vein. - PET scan (10/07/2021): Hypermetabolic left kidney mass with SUV 7.1.  Bilateral abdominal retroperitoneal hypermetabolic lymph nodes.  Multifocal bone metastasis.  Central sacrum, inferior aspect of L3 vertebral body, T8 body, C7 spinous process and medial right clavicle. 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  Biopsy shows a poorly differentiated neoplasm, IHC positive for CK8/18 (subset), CD10 and cytokeratin AE 1/3.  Cells are negative for CK20, S100, HMB45, SOX10, calretinin, P504S, prostein, PSA, desmin and CDX2. - I have talked to our pathologist Dr.  Melina Copa who thought it is most likely clear-cell histology although IHC does not support it. - IMDC criteria: 1 risk factor with less than 1 year from time of diagnosis to systemic therapy.  Intermediate risk group - Opdivo and Yervoy cycle 1 started on 11/17/2021. 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  He is a potential candidate for belzutifan on further progression.    Social/family history: - His nephew lives with him.  He has a 63-year-old daughter who was given for adoption.  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  He is a current active smoker, 1 pack/day for close to 40 years.  He drinks 1 beer at night. - Maternal aunt had breast cancer.  Maternal cousin has prostate cancer and another maternal cousin had breast cancer.  Maternal cousin also had kidney removed, thought to be secondary to cancer.  Paternal grandfather had lung cancer.   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  Left kidney mass slightly decreased.  Left para-aortic lymph nodes have slightly increased with increase in SUV.  No new lymph nodes. -  He has tolerated last dose of Opdivo very well.  Labs today shows normal LFTs and CBC.  TSH is 0.9. - Proceed with Opdivo today.  RTC 4 weeks with repeat PET scan. - He will receive final mL of normal saline today as his creatinine is elevated at 1.28.  His baseline is less than 1.  He was told to increase his water intake to 2 L/day.  2.  Low back pain: - Continue Mobic and Flexeril as needed.  3.  Bone metastatic disease: - He is having root canal treatments done.  We will start denosumab after the treatments are done.  4.  Stiffness and pain in the hand joints and knees: - Use prednisone 5 mg daily as  needed.  He used it 2 days last week.   Orders placed this encounter:  No orders of the defined types were placed in this encounter.     Derek Jack, MD [place] [phone]

## 2022-07-04 NOTE — Patient Instructions (Signed)
[facility]  Discharge Instructions: Thank you for choosing [facility] to provide your oncology and hematology care.  If you have a lab appointment with the [facility], please come in thru the Main Entrance and check in at the main information desk.  Wear comfortable clothing and clothing appropriate for easy access to any Portacath or PICC line.   We strive to give you quality time with your provider. You may need to reschedule your appointment if you arrive late (15 or more minutes).  Arriving late affects you and other patients whose appointments are after yours.  Also, if you miss three or more appointments without notifying the office, you may be dismissed from the clinic at the provider's discretion.      For prescription refill requests, have your pharmacy contact our office and allow 72 hours for refills to be completed.    Today you received the following chemotherapy and/or immunotherapy agents opdivo   To help prevent nausea and vomiting after your treatment, we encourage you to take your nausea medication as directed.  BELOW ARE SYMPTOMS THAT SHOULD BE REPORTED IMMEDIATELY: *FEVER GREATER THAN 100.4 F (38 C) OR HIGHER *CHILLS OR SWEATING *NAUSEA AND VOMITING THAT IS NOT CONTROLLED WITH YOUR NAUSEA MEDICATION *UNUSUAL SHORTNESS OF BREATH *UNUSUAL BRUISING OR BLEEDING *URINARY PROBLEMS (pain or burning when urinating, or frequent urination) *BOWEL PROBLEMS (unusual diarrhea, constipation, pain near the anus) TENDERNESS IN MOUTH AND THROAT WITH OR WITHOUT PRESENCE OF ULCERS (sore throat, sores in mouth, or a toothache) UNUSUAL RASH, SWELLING OR PAIN  UNUSUAL VAGINAL DISCHARGE OR ITCHING   Items with * indicate a potential emergency and should be followed up as soon as possible or go to the Emergency Department if any problems should occur.  Please show the CHEMOTHERAPY ALERT CARD or IMMUNOTHERAPY ALERT CARD at check-in to the Emergency  Department and triage nurse.  Should you have questions after your visit or need to cancel or reschedule your appointment, please contact [facility] [phone]  and follow the prompts.  Office hours are 8:00 a.m. to 4:30 p.m. Monday - Friday. Please note that voicemails left after 4:00 p.m. may not be returned until the following business day.  We are closed weekends and major holidays. You have access to a nurse at all times for urgent questions. Please call the main number to the clinic [phone] and follow the prompts.  For any non-urgent questions, you may also contact your provider using MyChart. We now offer e-Visits for anyone 49 and older to request care online for non-urgent symptoms. For details visit [email or web address].   Also download the MyChart app! Go to the app store, search "MyChart", open the app, select [facility], and log in with your MyChart username and password.

## 2022-07-04 NOTE — Patient Instructions (Addendum)
[place] at [facility] Discharge Instructions   You were seen and examined today by Dr. Delton Coombes.  He reviewed the results of your lab work which are mostly normal/stable. You kidney function is elevated where it is usually normal. Try to drink at least 2 liters of water per day. We will give you additional IV fluid today to help with your kidney function.   We will proceed with your treatment today.   Return as scheduled.    Thank you for choosing [place] at [facility] to provide your oncology and hematology care.  To afford each patient quality time with our provider, please arrive at least 15 minutes before your scheduled appointment time.   If you have a lab appointment with the [facility] please come in thru the Main Entrance and check in at the main information desk.  You need to re-schedule your appointment should you arrive 10 or more minutes late.  We strive to give you quality time with our providers, and arriving late affects you and other patients whose appointments are after yours.  Also, if you no show three or more times for appointments you may be dismissed from the clinic at the providers discretion.     Again, thank you for choosing [facility].  Our hope is that these requests will decrease the amount of time that you wait before being seen by our physicians.       _____________________________________________________________  Should you have questions after your visit to [facility], please contact our office at [phone] and follow the prompts.  Our office hours are 8:00 a.m. and 4:30 p.m. Monday - Friday.  Please note that voicemails left after 4:00 p.m. may not be returned until the following business day.  We are closed weekends and major holidays.  You do have access to a nurse 24-7, just call the main number to the clinic [phone] and do not press any options, hold on the line  and a nurse will answer the phone.    For prescription refill requests, have your pharmacy contact our office and allow 72 hours.    Due to Covid, you will need to wear a mask upon entering the hospital. If you do not have a mask, a mask will be given to you at the Main Entrance upon arrival. For doctor visits, patients may have 1 support person age 57 or older with them. For treatment visits, patients can not have anyone with them due to social distancing guidelines and our immunocompromised population.

## 2022-07-04 NOTE — Progress Notes (Signed)
Patients port flushed without difficulty.  Good blood return noted with no bruising or swelling noted at site.  Stable during access and blood draw.  Patient to remain accessed for treatment. 

## 2022-07-04 NOTE — Progress Notes (Signed)
Labs reviewed at office visit today. Will proceed as planned per MD.   Treatment given per orders. Patient tolerated it well without problems. Vitals stable and discharged home from clinic ambulatory. Follow up as scheduled.

## 2022-07-05 LAB — T4: T4, Total: 8 ug/dL (ref 4.5–12.0)

## 2022-07-06 ENCOUNTER — Other Ambulatory Visit: Payer: Commercial Managed Care - PPO

## 2022-07-06 ENCOUNTER — Ambulatory Visit: Payer: Commercial Managed Care - PPO

## 2022-07-06 ENCOUNTER — Ambulatory Visit: Payer: Commercial Managed Care - PPO | Admitting: Hematology

## 2022-07-15 ENCOUNTER — Emergency Department (HOSPITAL_COMMUNITY)
Admission: EM | Admit: 2022-07-15 | Discharge: 2022-07-15 | Disposition: A | Discharge status: Home / Self Care | Payer: Commercial Managed Care - PPO | Attending: Emergency Medicine | Admitting: Emergency Medicine

## 2022-07-15 ENCOUNTER — Other Ambulatory Visit: Payer: Self-pay

## 2022-07-15 ENCOUNTER — Emergency Department (HOSPITAL_COMMUNITY): Payer: Commercial Managed Care - PPO

## 2022-07-15 DIAGNOSIS — I2510 Atherosclerotic heart disease of native coronary artery without angina pectoris: Secondary | ICD-10-CM | POA: Diagnosis not present

## 2022-07-15 DIAGNOSIS — R079 Chest pain, unspecified: Secondary | ICD-10-CM | POA: Diagnosis not present

## 2022-07-15 DIAGNOSIS — J449 Chronic obstructive pulmonary disease, unspecified: Secondary | ICD-10-CM | POA: Insufficient documentation

## 2022-07-15 DIAGNOSIS — R1012 Left upper quadrant pain: Secondary | ICD-10-CM | POA: Insufficient documentation

## 2022-07-15 DIAGNOSIS — Z85528 Personal history of other malignant neoplasm of kidney: Secondary | ICD-10-CM | POA: Diagnosis not present

## 2022-07-15 LAB — CBC WITH DIFFERENTIAL/PLATELET
Abs Immature Granulocytes: 0.04 10*3/uL (ref 0.00–0.07)
Basophils Absolute: 0.1 10*3/uL (ref 0.0–0.1)
Basophils Relative: 1 %
Eosinophils Absolute: 0.3 10*3/uL (ref 0.0–0.5)
Eosinophils Relative: 3 %
HCT: 37.4 % — ABNORMAL LOW (ref 39.0–52.0)
Hemoglobin: 11.9 g/dL — ABNORMAL LOW (ref 13.0–17.0)
Immature Granulocytes: 0 %
Lymphocytes Relative: 15 %
Lymphs Abs: 1.6 10*3/uL (ref 0.7–4.0)
MCH: 27.8 pg (ref 26.0–34.0)
MCHC: 31.8 g/dL (ref 30.0–36.0)
MCV: 87.4 fL (ref 80.0–100.0)
Monocytes Absolute: 1 10*3/uL (ref 0.1–1.0)
Monocytes Relative: 10 %
Neutro Abs: 7.4 10*3/uL (ref 1.7–7.7)
Neutrophils Relative %: 71 %
Platelets: 319 10*3/uL (ref 150–400)
RBC: 4.28 MIL/uL (ref 4.22–5.81)
RDW: 13.5 % (ref 11.5–15.5)
WBC: 10.4 10*3/uL (ref 4.0–10.5)
nRBC: 0 % (ref 0.0–0.2)

## 2022-07-15 LAB — COMPREHENSIVE METABOLIC PANEL
ALT: 21 U/L (ref 0–44)
AST: 19 U/L (ref 15–41)
Albumin: 3 g/dL — ABNORMAL LOW (ref 3.5–5.0)
Alkaline Phosphatase: 67 U/L (ref 38–126)
Anion gap: 11 (ref 5–15)
BUN: 20 mg/dL (ref 6–20)
CO2: 25 mmol/L (ref 22–32)
Calcium: 8.6 mg/dL — ABNORMAL LOW (ref 8.9–10.3)
Chloride: 101 mmol/L (ref 98–111)
Creatinine, Ser: 1.27 mg/dL — ABNORMAL HIGH (ref 0.61–1.24)
GFR, Estimated: >60 mL/min (ref >60)
Glucose, Bld: 94 mg/dL (ref 70–99)
Potassium: 3.8 mmol/L (ref 3.5–5.1)
Sodium: 137 mmol/L (ref 135–145)
Total Bilirubin: 0.4 mg/dL (ref 0.3–1.2)
Total Protein: 7.5 g/dL (ref 6.5–8.1)

## 2022-07-15 LAB — LIPASE, BLOOD: Lipase: 35 U/L (ref 11–51)

## 2022-07-15 LAB — URINALYSIS, ROUTINE W REFLEX MICROSCOPIC
Bacteria, UA: NONE SEEN
Bilirubin Urine: NEGATIVE
Glucose, UA: NEGATIVE mg/dL
Hgb urine dipstick: NEGATIVE
Ketones, ur: NEGATIVE mg/dL
Leukocytes,Ua: NEGATIVE
Nitrite: NEGATIVE
Protein, ur: 30 mg/dL — AB
Specific Gravity, Urine: 1.012 (ref 1.005–1.030)
pH: 8 (ref 5.0–8.0)

## 2022-07-15 MED ORDER — HYDROMORPHONE HCL 1 MG/ML IJ SOLN
1.0000 mg | Freq: Once | INTRAMUSCULAR | Status: AC
  Administered 2022-07-15: 1 mg via INTRAVENOUS
  Filled 2022-07-15: qty 1

## 2022-07-15 NOTE — ED Provider Notes (Signed)
[facility] Provider Note   CSN: [id number] Arrival date & time: 07/15/22  1221     History  Chief Complaint  Patient presents with   Abdominal Pain    Jacob Reynolds is a 57 y.o. male.  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  Patient reports that he was walking at work and he felt acute onset pain in his central chest and left upper quadrant.  Has difficulty characterizing.  Says it is 9/10 in severity and has now improved to 2/10 in severity.  Does not appear to be exertional or pleuritic.  No shortness of breath or cough.  No lower extremity swelling.  No history of DVT or PE.  Reports a history of CAD but no stents.  Denies any dysuria or frequency.       Home Medications Prior to Admission medications   Medication Sig Start Date End Date Taking? Authorizing Provider  Nivolumab (OPDIVO IV) Inject into the vein every 21 ( twenty-one) days. Every 21 days x 4 cycles, then every 28 days 11/17/21  Yes [provider]  predniSONE (DELTASONE) 5 MG tablet Take 1 tablet (5 mg total) by mouth daily with breakfast. Patient taking differently: Take 5 mg by mouth daily as needed (pt states he takes when his hands cramp). 04/14/22  Yes Derek Jack, MD  ciclopirox (PENLAC) 8 % solution Apply topically at bedtime. Apply over nail and surrounding skin. Apply daily over previous coat. After seven (7) days, may remove with alcohol and continue cycle. Patient not taking: Reported on 07/15/2022 08/05/21   Trula Slade, DPM  meloxicam (MOBIC) 15 MG tablet Take 1 tablet (15 mg total) by mouth daily as needed for pain. Patient not taking: Reported on 07/15/2022 11/04/21 11/04/22  Derek Jack, MD      Allergies    Iodine    Review of Systems   Review of Systems  Physical Exam Updated Vital Signs BP 128/82   Pulse 76    Temp 99.2 F (37.3 C) (Oral)   Resp 12   Ht '5\' 11"'$  (1.803 m)   Wt 81.6 kg   SpO2 98%   BMI 25.10 kg/m  Physical Exam Vitals and nursing note reviewed.  Constitutional:      General: He is not in acute distress.    Appearance: He is well-developed.  HENT:     Head: Normocephalic and atraumatic.     Right Ear: External ear normal.     Left Ear: External ear normal.     Nose: Nose normal.  Eyes:     Extraocular Movements: Extraocular movements intact.     Conjunctiva/sclera: Conjunctivae normal.     Pupils: Pupils are equal, round, and reactive to light.  Cardiovascular:     Rate and Rhythm: Normal rate and regular rhythm.     Heart sounds: Normal heart sounds.  Pulmonary:     Effort: Pulmonary effort is normal. No respiratory distress.     Breath sounds: Normal breath sounds.  Abdominal:     General: There is no distension.     Palpations: Abdomen is soft. There is no mass.     Tenderness: There is abdominal tenderness (Left flank). There is no guarding.  Musculoskeletal:     Cervical back: Normal range of motion and neck supple.     Right lower leg: No edema.  Left lower leg: No edema.  Skin:    General: Skin is warm and dry.  Neurological:     Mental Status: He is alert. Mental status is at baseline.  Psychiatric:        Mood and Affect: Mood normal.        Behavior: Behavior normal.     ED Results / Procedures / Treatments   Labs (all labs ordered are listed, but only abnormal results are displayed) Labs Reviewed  COMPREHENSIVE METABOLIC PANEL - Abnormal; Notable for the following components:      Result Value   Creatinine, Ser 1.27 (*)    Calcium 8.6 (*)    Albumin 3.0 (*)    All other components within normal limits  CBC WITH DIFFERENTIAL/PLATELET - Abnormal; Notable for the following components:   Hemoglobin 11.9 (*)    HCT 37.4 (*)    All other components within normal limits  URINALYSIS, ROUTINE W REFLEX MICROSCOPIC - Abnormal; Notable for the  following components:   Protein, ur 30 (*)    All other components within normal limits  LIPASE, BLOOD    EKG EKG Interpretation  Date/Time:  Friday July 15 2022 12:27:35 EST Ventricular Rate:  73 PR Interval:  155 QRS Duration: 94 QT Interval:  365 QTC Calculation: 403 R Axis:   29 Text Interpretation: Sinus rhythm Atrial premature complex Borderline low voltage, extremity leads RSR' in V1 or V2, probably normal variant Confirmed by Margaretmary Eddy [phone]) on 07/15/2022 1:53:59 PM  Radiology CT CHEST ABDOMEN PELVIS WO CONTRAST  Result Date: 07/15/2022 CLINICAL DATA:  Left-sided abdominal pain, history of metastatic left renal cell carcinoma * Tracking Code: BO * EXAM: CT CHEST, ABDOMEN AND PELVIS WITHOUT CONTRAST TECHNIQUE: Multidetector CT imaging of the chest, abdomen and pelvis was performed following the standard protocol without IV contrast. RADIATION DOSE REDUCTION: This exam was performed according to the departmental dose-optimization program which includes automated exposure control, adjustment of the mA and/or kV according to patient size and/or use of iterative reconstruction technique. COMPARISON:  PET-CT, 04/28/2022 FINDINGS: CT CHEST FINDINGS Cardiovascular: Right chest port catheter. Aortic atherosclerosis. Normal heart size. Left coronary artery calcifications. No pericardial effusion. Mediastinum/Nodes: No enlarged mediastinal, hilar, or axillary lymph nodes. Thyroid gland, trachea, and esophagus demonstrate no significant findings. Lungs/Pleura: Mild centrilobular emphysema diffuse bilateral bronchial wall thickening. No pleural effusion or pneumothorax. Musculoskeletal: No chest wall abnormality. No acute osseous findings. CT ABDOMEN PELVIS FINDINGS Hepatobiliary: No solid liver abnormality is seen. No gallstones, gallbladder wall thickening, or biliary dilatation. Pancreas: Unremarkable. No pancreatic ductal dilatation or surrounding inflammatory changes. Spleen: Normal in  size without significant abnormality. Adrenals/Urinary Tract: Significant interval enlargement of left retroperitoneal mass, measuring 3.7 x 3.6 cm, previously 1.0 cm (series 2, image 60). Very edematous appearance of the left kidney with extensive adjacent perinephric fat stranding. Slightly enlarged appearance of an ill-defined hypodense mass of the superior pole of the left kidney measuring 6.5 x 4.3 cm kidney (series 2, image 60). New, moderate left hydronephrosis and proximal hydroureter with high attenuation within the calices. No obstructing calculus or other obstructing ureteral etiology identified to the left ureterovesicular junction. There may be obstruction of the mid left ureter secondary to matted soft tissue in the retroperitoneum (series 2, image 84). Mild right-sided hydronephrosis, likewise new compared to prior examination and again without calculus or other definite obstructing etiology identified. Punctuate nonobstructive calculus of the inferior pole of the left kidney (series 2, image 78). Bladder is unremarkable. Stomach/Bowel: Stomach is within  normal limits. Appendix appears normal. No evidence of bowel wall thickening, distention, or inflammatory changes. Descending and sigmoid diverticulosis. Vascular/Lymphatic: Aortic atherosclerosis. Significant interval enlargement of bulky, matted retroperitoneal lymphadenopathy, index left retroperitoneal node measuring 2.3 x 1.8 cm, previously 1.7 x 1.5 cm (series 2, image 74). Reproductive: No mass or other abnormality. Other: No abdominal wall hernia or abnormality. No ascites. Musculoskeletal: No acute osseous findings. Patient's known osseous metastatic disease is not well appreciated by CT. IMPRESSION: 1. Very edematous appearance of the left kidney with extensive adjacent perinephric fat stranding. New, moderate left hydronephrosis and proximal hydroureter with high attenuation within the calices suggesting blood product or clot. No  obstructing calculus or other obstructing ureteral etiology identified to the left ureterovesicular junction. There may be obstruction of the mid left ureter secondary to matted soft lymphadenopathy and tissue in the retroperitoneum. 2. Mild right hydronephrosis, new compared to prior examination, without directly visualized obstructing etiology. 3. Slightly enlarged appearance of an ill-defined hypodense mass of the superior pole of the left kidney measuring 6.5 x 4.3 cm, consistent with known renal cell carcinoma. 4. Significant interval enlargement of bulky, matted retroperitoneal lymphadenopathy, consistent with worsened nodal metastatic disease. 5. Patient's known osseous metastatic disease is not well appreciated by CT. 6. Emphysema and diffuse bilateral bronchial wall thickening. 7. Coronary artery disease. Aortic Atherosclerosis (ICD10-I70.0) and Emphysema (ICD10-J43.9). Electronically Signed   By: Delanna Ahmadi M.D.   On: 07/15/2022 17:03    Procedures Procedures   Medications Ordered in ED Medications  HYDROmorphone (DILAUDID) injection 1 mg (1 mg Intravenous Given 07/15/22 1723)    ED Course/ Medical Decision Making/ A&P Clinical Course as of 07/15/22 1738  Fri Jul 15, 2022  1439 Creatinine(!): 1.27 Similar prior.  [RP]  9449 Signed out to Dr Rogene Houston.  [RP]    Clinical Course User Index [RP] Fransico Meadow, MD                             Medical Decision Making Amount and/or Complexity of Data Reviewed Labs: ordered. Decision-making details documented in ED Course. Radiology: ordered.  Risk Prescription drug management.   FERMAN BASILIO is a 57 y.o. 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    Initial Ddx:  Worsening metastatic renal cell carcinoma, MI, PE, pneumonia, kidney stone, diverticulitis, pyelonephritis  MDM:   Feel that the patient's symptoms are likely due to his worsening renal cell carcinoma and will obtain CT to evaluate for this.  Also considered MI but is not having any other anginal symptoms.  Did consider pulmonary embolism but not having any significant shortness of breath and is not hypoxic so feel that this is less likely.  With CT will evaluate for possible pneumonia, kidney stone, or diverticulitis.  Will obtain urinalysis to assess for pyelonephritis.  Plan:  Labs Urinalysis CT chest abdomen pelvis  ED Summary/Re-evaluation:  Patient underwent the above workup which did not show evidence of UTI on his lab work.  Remainder of labs were consistent with prior.  Signed out to oncoming ED physician awaiting results of his CT scan.  This patient presents to the ED for concern of complaints listed in HPI, this involves an extensive number of treatment options, and is a complaint that carries with it a high risk of complications and morbidity. Disposition including potential need for admission  considered.   Dispo: Pending remainder of workup  Records reviewed Outpatient Clinic Notes The following labs were independently interpreted: Urinalysis and show no acute abnormality I independently reviewed the following imaging with scope of interpretation limited to determining acute life threatening conditions related to emergency care: CT Chest and CT Abdomen/Pelvis and agree with the radiologist interpretation with the following exceptions: None I personally reviewed and interpreted cardiac monitoring: normal sinus rhythm  I personally reviewed and interpreted the pt's EKG: see above for interpretation  I have reviewed the patients home medications and made adjustments as needed  Final Clinical Impression(s) / ED Diagnoses Final diagnoses:  Left upper quadrant abdominal pain  History of renal cell carcinoma    Rx / DC Orders ED Discharge Orders     None         Fransico Meadow,  MD 07/15/22 [phone]

## 2022-07-15 NOTE — ED Notes (Signed)
Pt removed all of his monitoring and states that he is going to be leaving. Pt educated on risk of leaving AMA including worsening symptoms, pain and even risk of death. Pt states understanding of risk and states that he will return if pain or any other symptoms return

## 2022-07-15 NOTE — ED Notes (Signed)
Patient is aware of needing urine specimen. This nurse tech gave pt urinal. Nurse notified.

## 2022-07-15 NOTE — ED Provider Notes (Signed)
Patient left AMA.  Patient was notified that he had a markedly abnormal CT scan and wanted to discuss this with urology and oncology.  Results for orders placed or performed during the hospital encounter of 07/15/22  Comprehensive metabolic panel  Result Value Ref Range   Sodium 137 135 - 145 mmol/L   Potassium 3.8 3.5 - 5.1 mmol/L   Chloride 101 98 - 111 mmol/L   CO2 25 22 - 32 mmol/L   Glucose, Bld 94 70 - 99 mg/dL   BUN 20 6 - 20 mg/dL   Creatinine, Ser 1.27 (H) 0.61 - 1.24 mg/dL   Calcium 8.6 (L) 8.9 - 10.3 mg/dL   Total Protein 7.5 6.5 - 8.1 g/dL   Albumin 3.0 (L) 3.5 - 5.0 g/dL   AST 19 15 - 41 U/L   ALT 21 0 - 44 U/L   Alkaline Phosphatase 67 38 - 126 U/L   Total Bilirubin 0.4 0.3 - 1.2 mg/dL   GFR, Estimated >60 >60 mL/min   Anion gap 11 5 - 15  Lipase, blood  Result Value Ref Range   Lipase 35 11 - 51 U/L  CBC with Diff  Result Value Ref Range   WBC 10.4 4.0 - 10.5 K/uL   RBC 4.28 4.22 - 5.81 MIL/uL   Hemoglobin 11.9 (L) 13.0 - 17.0 g/dL   HCT 37.4 (L) 39.0 - 52.0 %   MCV 87.4 80.0 - 100.0 fL   MCH 27.8 26.0 - 34.0 pg   MCHC 31.8 30.0 - 36.0 g/dL   RDW 13.5 11.5 - 15.5 %   Platelets 319 150 - 400 K/uL   nRBC 0.0 0.0 - 0.2 %   Neutrophils Relative % 71 %   Neutro Abs 7.4 1.7 - 7.7 K/uL   Lymphocytes Relative 15 %   Lymphs Abs 1.6 0.7 - 4.0 K/uL   Monocytes Relative 10 %   Monocytes Absolute 1.0 0.1 - 1.0 K/uL   Eosinophils Relative 3 %   Eosinophils Absolute 0.3 0.0 - 0.5 K/uL   Basophils Relative 1 %   Basophils Absolute 0.1 0.0 - 0.1 K/uL   Immature Granulocytes 0 %   Abs Immature Granulocytes 0.04 0.00 - 0.07 K/uL  Urinalysis, Routine w reflex microscopic -Urine, Clean Catch  Result Value Ref Range   Color, Urine YELLOW YELLOW   APPearance CLEAR CLEAR   Specific Gravity, Urine 1.012 1.005 - 1.030   pH 8.0 5.0 - 8.0   Glucose, UA NEGATIVE NEGATIVE mg/dL   Hgb urine dipstick NEGATIVE NEGATIVE   Bilirubin Urine NEGATIVE NEGATIVE   Ketones, ur NEGATIVE  NEGATIVE mg/dL   Protein, ur 30 (A) NEGATIVE mg/dL   Nitrite NEGATIVE NEGATIVE   Leukocytes,Ua NEGATIVE NEGATIVE   RBC / HPF 6-10 0 - 5 RBC/hpf   WBC, UA 0-5 0 - 5 WBC/hpf   Bacteria, UA NONE SEEN NONE SEEN   Squamous Epithelial / HPF 0-5 0 - 5 /HPF   Mucus PRESENT    CT CHEST ABDOMEN PELVIS WO CONTRAST  Result Date: 07/15/2022 CLINICAL DATA:  Left-sided abdominal pain, history of metastatic left renal cell carcinoma * Tracking Code: BO * EXAM: CT CHEST, ABDOMEN AND PELVIS WITHOUT CONTRAST TECHNIQUE: Multidetector CT imaging of the chest, abdomen and pelvis was performed following the standard protocol without IV contrast. RADIATION DOSE REDUCTION: This exam was performed according to the departmental dose-optimization program which includes automated exposure control, adjustment of the mA and/or kV according to patient size and/or use of iterative reconstruction  technique. COMPARISON:  PET-CT, 04/28/2022 FINDINGS: CT CHEST FINDINGS Cardiovascular: Right chest port catheter. Aortic atherosclerosis. Normal heart size. Left coronary artery calcifications. No pericardial effusion. Mediastinum/Nodes: No enlarged mediastinal, hilar, or axillary lymph nodes. Thyroid gland, trachea, and esophagus demonstrate no significant findings. Lungs/Pleura: Mild centrilobular emphysema diffuse bilateral bronchial wall thickening. No pleural effusion or pneumothorax. Musculoskeletal: No chest wall abnormality. No acute osseous findings. CT ABDOMEN PELVIS FINDINGS Hepatobiliary: No solid liver abnormality is seen. No gallstones, gallbladder wall thickening, or biliary dilatation. Pancreas: Unremarkable. No pancreatic ductal dilatation or surrounding inflammatory changes. Spleen: Normal in size without significant abnormality. Adrenals/Urinary Tract: Significant interval enlargement of left retroperitoneal mass, measuring 3.7 x 3.6 cm, previously 1.0 cm (series 2, image 60). Very edematous appearance of the left kidney with  extensive adjacent perinephric fat stranding. Slightly enlarged appearance of an ill-defined hypodense mass of the superior pole of the left kidney measuring 6.5 x 4.3 cm kidney (series 2, image 60). New, moderate left hydronephrosis and proximal hydroureter with high attenuation within the calices. No obstructing calculus or other obstructing ureteral etiology identified to the left ureterovesicular junction. There may be obstruction of the mid left ureter secondary to matted soft tissue in the retroperitoneum (series 2, image 84). Mild right-sided hydronephrosis, likewise new compared to prior examination and again without calculus or other definite obstructing etiology identified. Punctuate nonobstructive calculus of the inferior pole of the left kidney (series 2, image 78). Bladder is unremarkable. Stomach/Bowel: Stomach is within normal limits. Appendix appears normal. No evidence of bowel wall thickening, distention, or inflammatory changes. Descending and sigmoid diverticulosis. Vascular/Lymphatic: Aortic atherosclerosis. Significant interval enlargement of bulky, matted retroperitoneal lymphadenopathy, index left retroperitoneal node measuring 2.3 x 1.8 cm, previously 1.7 x 1.5 cm (series 2, image 74). Reproductive: No mass or other abnormality. Other: No abdominal wall hernia or abnormality. No ascites. Musculoskeletal: No acute osseous findings. Patient's known osseous metastatic disease is not well appreciated by CT. IMPRESSION: 1. Very edematous appearance of the left kidney with extensive adjacent perinephric fat stranding. New, moderate left hydronephrosis and proximal hydroureter with high attenuation within the calices suggesting blood product or clot. No obstructing calculus or other obstructing ureteral etiology identified to the left ureterovesicular junction. There may be obstruction of the mid left ureter secondary to matted soft lymphadenopathy and tissue in the retroperitoneum. 2. Mild right  hydronephrosis, new compared to prior examination, without directly visualized obstructing etiology. 3. Slightly enlarged appearance of an ill-defined hypodense mass of the superior pole of the left kidney measuring 6.5 x 4.3 cm, consistent with known renal cell carcinoma. 4. Significant interval enlargement of bulky, matted retroperitoneal lymphadenopathy, consistent with worsened nodal metastatic disease. 5. Patient's known osseous metastatic disease is not well appreciated by CT. 6. Emphysema and diffuse bilateral bronchial wall thickening. 7. Coronary artery disease. Aortic Atherosclerosis (ICD10-I70.0) and Emphysema (ICD10-J43.9). Electronically Signed   By: Delanna Ahmadi M.D.   On: 07/15/2022 17:03      Fredia Sorrow, MD 07/15/22 2009

## 2022-07-15 NOTE — ED Triage Notes (Signed)
Pt bib EMS from work c/o LUQ sudden onset today, better when he lays down. Denies other symptoms. Pt states his pain is better 2/10 now from 9/10. H/o kidney cancer.   142/87 HR 75 SPO2 99% Rt AC 18g.

## 2022-07-17 ENCOUNTER — Other Ambulatory Visit: Payer: Self-pay

## 2022-07-18 ENCOUNTER — Telehealth: Payer: Self-pay | Admitting: Family Medicine

## 2022-07-18 ENCOUNTER — Other Ambulatory Visit: Payer: Self-pay

## 2022-07-18 NOTE — Telephone Encounter (Signed)
Scheduled pt with PCP 07/20/22 for ER follow up and to discuss imaging.

## 2022-07-19 ENCOUNTER — Other Ambulatory Visit: Payer: Self-pay

## 2022-07-19 MED ORDER — TRAMADOL HCL 50 MG PO TABS: 50.0000 mg | ORAL_TABLET | Freq: Three times a day (TID) | ORAL | 0 refills | Status: DC | PRN

## 2022-07-20 ENCOUNTER — Ambulatory Visit: Payer: Commercial Managed Care - PPO | Admitting: Family Medicine

## 2022-07-27 ENCOUNTER — Other Ambulatory Visit: Payer: Self-pay

## 2022-07-28 ENCOUNTER — Encounter (HOSPITAL_COMMUNITY)
Admission: RE | Admit: 2022-07-28 | Discharge: 2022-07-28 | Disposition: A | Discharge status: Home / Self Care | Payer: Commercial Managed Care - PPO | Source: Ambulatory Visit | Attending: Hematology | Admitting: Hematology

## 2022-07-28 ENCOUNTER — Inpatient Hospital Stay: Payer: Commercial Managed Care - PPO | Attending: Hematology | Admitting: Licensed Clinical Social Worker

## 2022-07-28 ENCOUNTER — Other Ambulatory Visit: Payer: Self-pay

## 2022-07-28 DIAGNOSIS — Z803 Family history of malignant neoplasm of breast: Secondary | ICD-10-CM | POA: Insufficient documentation

## 2022-07-28 DIAGNOSIS — M545 Low back pain, unspecified: Secondary | ICD-10-CM | POA: Diagnosis not present

## 2022-07-28 DIAGNOSIS — M25562 Pain in left knee: Secondary | ICD-10-CM | POA: Insufficient documentation

## 2022-07-28 DIAGNOSIS — C7951 Secondary malignant neoplasm of bone: Secondary | ICD-10-CM | POA: Insufficient documentation

## 2022-07-28 DIAGNOSIS — C649 Malignant neoplasm of unspecified kidney, except renal pelvis: Secondary | ICD-10-CM

## 2022-07-28 DIAGNOSIS — C642 Malignant neoplasm of left kidney, except renal pelvis: Secondary | ICD-10-CM | POA: Diagnosis present

## 2022-07-28 DIAGNOSIS — Z8051 Family history of malignant neoplasm of kidney: Secondary | ICD-10-CM | POA: Insufficient documentation

## 2022-07-28 DIAGNOSIS — Z79899 Other long term (current) drug therapy: Secondary | ICD-10-CM | POA: Diagnosis not present

## 2022-07-28 DIAGNOSIS — Z801 Family history of malignant neoplasm of trachea, bronchus and lung: Secondary | ICD-10-CM | POA: Insufficient documentation

## 2022-07-28 DIAGNOSIS — M25561 Pain in right knee: Secondary | ICD-10-CM | POA: Insufficient documentation

## 2022-07-28 DIAGNOSIS — F17210 Nicotine dependence, cigarettes, uncomplicated: Secondary | ICD-10-CM | POA: Insufficient documentation

## 2022-07-28 DIAGNOSIS — Z8042 Family history of malignant neoplasm of prostate: Secondary | ICD-10-CM | POA: Insufficient documentation

## 2022-07-28 MED ORDER — FLUDEOXYGLUCOSE F - 18 (FDG) INJECTION
9.0200 | Freq: Once | INTRAVENOUS | Status: AC | PRN
  Administered 2022-07-28: 9.02 via INTRAVENOUS

## 2022-07-28 NOTE — Progress Notes (Signed)
[facility] CSW Progress Note  Clinical Education officer, museum contacted patient by phone to answer questions regarding disability.  Pt reports he does not feel as though he can continue to work full time as his overall health is declining as he goes through treatment.  Pt states he resides alone and has family that would be able to offer limited assistance, but would not be able to financially support him if he stops working.  Pt asked at work [employer]) if he could reduce his hours, but they are not willing to accommodate this request.  Pt does not have any short term disability benefits through work.  CSW explained to pt that he will need to stop working in order for his income to not over qualify him for Medicaid and SSI.  CSW offered to send a referral through to the [facility] to assist w/ those applications, as well as a long term disability application.  Pt is not ready to stop working just yet, but will contact CSW when he is to start the referral process.  CSW also spoke to pt about the [employer] and a [employer] w/ [employer] for his medical bills.  All of the above information was also emailed to pt.  CSW to move forward w/ the [facility] referral at the direction of pt.      Henriette Combs, LCSW

## 2022-07-31 ENCOUNTER — Other Ambulatory Visit: Payer: Self-pay

## 2022-08-01 ENCOUNTER — Telehealth: Payer: Self-pay

## 2022-08-01 ENCOUNTER — Other Ambulatory Visit: Payer: Self-pay

## 2022-08-01 ENCOUNTER — Other Ambulatory Visit (HOSPITAL_COMMUNITY): Payer: Self-pay

## 2022-08-01 ENCOUNTER — Inpatient Hospital Stay: Payer: Commercial Managed Care - PPO

## 2022-08-01 ENCOUNTER — Inpatient Hospital Stay (HOSPITAL_BASED_OUTPATIENT_CLINIC_OR_DEPARTMENT_OTHER): Payer: Commercial Managed Care - PPO | Admitting: Hematology

## 2022-08-01 DIAGNOSIS — C649 Malignant neoplasm of unspecified kidney, except renal pelvis: Secondary | ICD-10-CM

## 2022-08-01 DIAGNOSIS — Z95828 Presence of other vascular implants and grafts: Secondary | ICD-10-CM | POA: Diagnosis not present

## 2022-08-01 DIAGNOSIS — C7951 Secondary malignant neoplasm of bone: Secondary | ICD-10-CM | POA: Diagnosis not present

## 2022-08-01 DIAGNOSIS — C642 Malignant neoplasm of left kidney, except renal pelvis: Secondary | ICD-10-CM | POA: Diagnosis not present

## 2022-08-01 LAB — CBC WITH DIFFERENTIAL/PLATELET
Abs Immature Granulocytes: 0.04 10*3/uL (ref 0.00–0.07)
Basophils Absolute: 0.1 10*3/uL (ref 0.0–0.1)
Basophils Relative: 0 %
Eosinophils Absolute: 0.1 10*3/uL (ref 0.0–0.5)
Eosinophils Relative: 1 %
HCT: 38.9 % — ABNORMAL LOW (ref 39.0–52.0)
Hemoglobin: 12.4 g/dL — ABNORMAL LOW (ref 13.0–17.0)
Immature Granulocytes: 0 %
Lymphocytes Relative: 10 %
Lymphs Abs: 1.2 10*3/uL (ref 0.7–4.0)
MCH: 26.6 pg (ref 26.0–34.0)
MCHC: 31.9 g/dL (ref 30.0–36.0)
MCV: 83.5 fL (ref 80.0–100.0)
Monocytes Absolute: 1.2 10*3/uL — ABNORMAL HIGH (ref 0.1–1.0)
Monocytes Relative: 10 %
Neutro Abs: 9.5 10*3/uL — ABNORMAL HIGH (ref 1.7–7.7)
Neutrophils Relative %: 79 %
Platelets: 393 10*3/uL (ref 150–400)
RBC: 4.66 MIL/uL (ref 4.22–5.81)
RDW: 14.1 % (ref 11.5–15.5)
WBC: 12.1 10*3/uL — ABNORMAL HIGH (ref 4.0–10.5)
nRBC: 0 % (ref 0.0–0.2)

## 2022-08-01 LAB — COMPREHENSIVE METABOLIC PANEL
ALT: 25 U/L (ref 0–44)
AST: 25 U/L (ref 15–41)
Albumin: 2.6 g/dL — ABNORMAL LOW (ref 3.5–5.0)
Alkaline Phosphatase: 61 U/L (ref 38–126)
Anion gap: 11 (ref 5–15)
BUN: 15 mg/dL (ref 6–20)
CO2: 26 mmol/L (ref 22–32)
Calcium: 8.5 mg/dL — ABNORMAL LOW (ref 8.9–10.3)
Chloride: 100 mmol/L (ref 98–111)
Creatinine, Ser: 1.3 mg/dL — ABNORMAL HIGH (ref 0.61–1.24)
GFR, Estimated: >60 mL/min (ref >60)
Glucose, Bld: 133 mg/dL — ABNORMAL HIGH (ref 70–99)
Potassium: 3.9 mmol/L (ref 3.5–5.1)
Sodium: 137 mmol/L (ref 135–145)
Total Bilirubin: 0.6 mg/dL (ref 0.3–1.2)
Total Protein: 7.3 g/dL (ref 6.5–8.1)

## 2022-08-01 LAB — TSH: TSH: 1.226 u[IU]/mL (ref 0.350–4.500)

## 2022-08-01 LAB — MAGNESIUM: Magnesium: 2.2 mg/dL (ref 1.7–2.4)

## 2022-08-01 MED ORDER — CABOZANTINIB S-MALATE 60 MG PO TABS
60.0000 mg | ORAL_TABLET | Freq: Every day | ORAL | 2 refills | Status: DC
  Filled 2022-08-01 – 2022-08-03 (×2): qty 30, 30d supply, fill #0
  Filled 2022-08-23: qty 30, 30d supply, fill #1

## 2022-08-01 MED ORDER — HEPARIN SOD (PORK) LOCK FLUSH 100 UNIT/ML IV SOLN
500.0000 [IU] | Freq: Once | INTRAVENOUS | Status: AC
  Administered 2022-08-01: 500 [IU] via INTRAVENOUS

## 2022-08-01 MED ORDER — SODIUM CHLORIDE 0.9% FLUSH
10.0000 mL | INTRAVENOUS | Status: DC | PRN
  Administered 2022-08-01: 10 mL via INTRAVENOUS

## 2022-08-01 MED ORDER — CABOMETYX 20 MG PO TABS
60.0000 mg | ORAL_TABLET | Freq: Every day | ORAL | 2 refills | Status: DC
  Filled 2022-08-01: qty 90, 30d supply, fill #0

## 2022-08-01 MED ORDER — SODIUM CHLORIDE 0.9% FLUSH
10.0000 mL | Freq: Once | INTRAVENOUS | Status: AC
  Administered 2022-08-01: 10 mL via INTRAVENOUS

## 2022-08-01 MED ORDER — PROCHLORPERAZINE MALEATE 10 MG PO TABS: 10.0000 mg | ORAL_TABLET | Freq: Four times a day (QID) | ORAL | 6 refills | Status: DC | PRN

## 2022-08-01 NOTE — Telephone Encounter (Signed)
Encounter opened to enter updated prescription.   Laray Anger, PharmD PGY-2 Pharmacy Resident Hematology/Oncology [phone]  08/01/2022 3:32 PM

## 2022-08-01 NOTE — Telephone Encounter (Signed)
Oral Oncology Patient Advocate Encounter   Was successful in obtaining a copay card for [employer]om[employer].  This copay card will make the patients copay as low as $0 depending on insurance co-pay.   The billing information is as follows and has been shared with [facility].   RxBin: [id number] PCN: Loyalty Member ID: [id number] Group ID: [id number]   Berdine Addison, [facility] Patient [place]  [phone] (phone) [phone] (fax) 08/01/2022 2:40 PM

## 2022-08-01 NOTE — Patient Instructions (Addendum)
[place] at [facility] Discharge Instructions   You were seen and examined today by Dr. Delton Coombes.  He reviewed the results of your PET scan which shows that the cancer has progressed. The lesions in your bones have come back. It is also present in the lymph nodes in the abdomen and the lining of the abdomen.   We need to change treatments. We will put you on a pill called Cabometyx. This will be used to treat the cancer. Treatment is one pill daily. Take this pill the same time every day. This pill will be delivered from a specialty pharmacy. Please expect calls from this pharmacy to set up delivery and also from our oral chemotherapy pharmacist to review information about this new drug in detail with you.   Return as scheduled.    Thank you for choosing [place] at [facility] to provide your oncology and hematology care.  To afford each patient quality time with our provider, please arrive at least 15 minutes before your scheduled appointment time.   If you have a lab appointment with the [facility] please come in thru the Main Entrance and check in at the main information desk.  You need to re-schedule your appointment should you arrive 10 or more minutes late.  We strive to give you quality time with our providers, and arriving late affects you and other patients whose appointments are after yours.  Also, if you no show three or more times for appointments you may be dismissed from the clinic at the providers discretion.     Again, thank you for choosing [facility].  Our hope is that these requests will decrease the amount of time that you wait before being seen by our physicians.       _____________________________________________________________  Should you have questions after your visit to [facility], please contact our office at [phone] and follow the prompts.  Our office hours are 8:00 a.m. and  4:30 p.m. Monday - Friday.  Please note that voicemails left after 4:00 p.m. may not be returned until the following business day.  We are closed weekends and major holidays.  You do have access to a nurse 24-7, just call the main number to the clinic [phone] and do not press any options, hold on the line and a nurse will answer the phone.    For prescription refill requests, have your pharmacy contact our office and allow 72 hours.    Due to Covid, you will need to wear a mask upon entering the hospital. If you do not have a mask, a mask will be given to you at the Main Entrance upon arrival. For doctor visits, patients may have 1 support person age 15 or older with them. For treatment visits, patients can not have anyone with them due to social distancing guidelines and our immunocompromised population.

## 2022-08-01 NOTE — Progress Notes (Signed)
No treatment today due to progression per MD.

## 2022-08-01 NOTE — Progress Notes (Signed)
Patients port flushed without difficulty.  Good blood return noted with no bruising or swelling noted at site.  Stable during access and blood draw.  Patient to remain accessed for treatment. 

## 2022-08-01 NOTE — Telephone Encounter (Signed)
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  CMP and CBC from 08/01/2022 assessed, there are no dose adjustments needed. Prescription dose and frequency assessed.   Current medication list in Epic reviewed, there are no DDIs with Cabometyx identified.  Evaluated chart and there are no patient barriers to medication adherence identified.   Prescription has been e-scribed to the [facility] for benefits analysis and approval.  Oral Oncology Clinic will continue to follow for insurance authorization, copayment issues, initial counseling and start date.  Laray Anger, PharmD PGY-2 Pharmacy Resident Hematology/Oncology [phone]  08/01/2022 3:11 PM

## 2022-08-01 NOTE — Progress Notes (Signed)
[facility] [address] [place] [address]    Clinic Day:  08/01/2022  Referring physician: [name], FNP  Patient Care Team: [name], FNP as PCP - General (Family Medicine) Brien Mates, RN as Oncology Nurse Navigator (Medical Oncology) Derek Jack, MD as Medical Oncologist (Medical Oncology)   ASSESSMENT & PLAN:   Assessment: Metastatic kidney cancer to the bones: - CT chest lung cancer screening scan (08/31/2021): Lung RADS 1S.  Possible soft tissue fullness in the upper pole of the left kidney. - US renal (09/15/2021): Mass with solid component in the upper pole of the left kidney measuring 6.1 cm, highly suspicious for RCC. - MRI of the abdomen (09/22/2021): 6.5 cm left kidney upper pole solid enhancing renal mass favoring RCC.  Scattered enhancing bone lesions suspicious for osseous metastatic disease, largest at L3 and in the central sacrum.  1.3 x 1.4 cm mass of the left adrenal gland has indeterminate characteristics by MRI but on CT chest seems to have a low-density favoring adenoma.  No tumor thrombus in the left renal vein. - PET scan (10/07/2021): Hypermetabolic left kidney mass with SUV 7.1.  Bilateral abdominal retroperitoneal hypermetabolic lymph nodes.  Multifocal bone metastasis.  Central sacrum, inferior aspect of L3 vertebral body, T8 body, C7 spinous process and medial right clavicle. 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  Biopsy shows a poorly differentiated neoplasm, IHC positive for CK8/18 (subset), CD10 and cytokeratin AE 1/3.  Cells are negative for CK20, S100, HMB45, SOX10, calretinin, P504S, prostein, PSA, desmin and CDX2. - I have talked to our pathologist Dr. Melina Copa who thought it is most likely clear-cell histology although IHC does not support it. - IMDC criteria: 1 risk factor with less than 1 year from time of diagnosis to systemic  therapy.  Intermediate risk group - Opdivo and Yervoy cycle 1 started on 11/17/2021.  Discontinued on 08/01/2022 due to progression. 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  He is a potential candidate for belzutifan on further progression. - Cabozantinib 60 mg daily started on 08/01/2022 - Germline mutation testing is negative.    Social/family history: - His nephew lives with him.  He has a 48-year-old daughter who was given for adoption.  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  He is a current active smoker, 1 pack/day for close to 40 years.  He drinks 1 beer at night. - Maternal aunt had breast cancer.  Maternal cousin has prostate cancer and another maternal cousin had breast cancer.  Maternal cousin also had kidney removed, thought to be secondary to cancer.  Paternal grandfather had lung cancer.  His sister reportedly diagnosed with stage IV kidney cancer.  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  Bone metastasis have also recurred.  Left kidney mass increased in size by 1 cm. - Recommend discontinuing nivolumab.  He reports feeling bloated in the abdomen.  Recommend taking Gas-X as needed. - Discussed options including Cabometyx and combination of lenvatinib and everolimus.  I recommend cabozantinib due to bone lesions.  Belzutifan is approved only after progression on  both immunotherapy and TKI. - We will send prescription of cabozantinib 60 mg daily and titrate down as needed. - We discussed side effects of cabozantinib in detail. - Will send Compazine every 6 hours as needed. -  RTC 2 weeks after starting cabozantinib.  2.  Low back pain: - Continue Mobic and Flexeril as needed.  3.  Bone metastatic disease: - We will start denosumab after dental treatments done.   4.  Stiffness and pain in the hand joints and knees: - Use prednisone 5 mg daily as needed.  He is not requiring it on a regular basis.  No orders of the defined types were placed in this encounter.     Beverly Gust Oliver,acting as a scribe for Derek Jack, MD.,have documented all relevant documentation on the behalf of Derek Jack, MD,as directed by  Derek Jack, MD while in the presence of Derek Jack, MD.   I, Derek Jack MD, have reviewed the above documentation for accuracy and completeness, and I agree with the above.   Derek Jack, MD   2/19/20246:09 PM  CHIEF COMPLAINT:   Diagnosis: metastatic kidney cancer    Cancer Staging  Metastatic renal cell carcinoma to bone [facility]) Staging form: Kidney, AJCC 8th Edition - Clinical stage from 11/04/2021: Stage IV (cT1b, cN1, pM1) - Unsigned    Prior Therapy:  Nivolumab + Ipilimumab q21d / Nivolumab q28d   Current Therapy: Cabozantinib   HISTORY OF PRESENT ILLNESS:   Oncology History  Metastatic renal cell carcinoma to bone ([place])  11/04/2021 Initial Diagnosis   Metastatic renal cell carcinoma to bone ([place])   11/17/2021 - 02/10/2022 Chemotherapy   Patient is on Treatment Plan : RENAL CELL CARCINOMA Nivolumab + Ipilimumab q21d / Nivolumab q28d     11/17/2021 - 07/04/2022 Chemotherapy   Patient is on Treatment Plan : RENAL CELL CARCINOMA Nivolumab (3) + Ipilimumab (1) q21d x 4 cycles  / Nivolumab (480) q28d        INTERVAL HISTORY:   Jacob Reynolds is a 57 y.o. male presenting to clinic today for follow up of metastatic kidney cancer. He was last seen by me on 01/22/20324.  On 07/15/2022 he was in the ED for left upper quadrant abdominal pain.  Today, he states that he is doing well overall. His  appetite level is at 50%. His energy level is at 60%. He states he has an abdominbal bloating feeling. Its not a painful feeling more of just a bloating feeling.  His eating is limited due to feeling bloated.  Is not requiring prednisone.  His sister has stage 4 kidney cancer.   PAST MEDICAL HISTORY:   Past Medical History: Past Medical History:  Diagnosis Date   Plantar fasciitis    Port-A-Cath in place 11/10/2021    Surgical History: Past Surgical History:  Procedure Laterality Date   IR IMAGING GUIDED PORT INSERTION  10/15/2021    Social History: Social History   Socioeconomic History   Marital status: Single    Spouse name: Not on file   Number of children: 1   Years of education: 11   Highest education level: 11th grade  Occupational History   Not on file  Tobacco Use   Smoking status: Every Day    Packs/day: 1.00    Years: 36.00    Total pack years: 36.00    Types: Cigarettes    Start date: 06/13/1978   Smokeless tobacco: Never  Substance and Sexual Activity   Alcohol use: Yes    Alcohol/week:  7.0 standard drinks of alcohol    Types: 7 Cans of beer per week   Drug use: Yes    Frequency: 1.0 times per week    Types: Marijuana   Sexual activity: Not on file  Other Topics Concern   Not on file  Social History Narrative   Not on file   Social Determinants of Health   Financial Resource Strain: Not on file  Food Insecurity: Not on file  Transportation Needs: Not on file  Physical Activity: Not on file  Stress: Not on file  Social Connections: Not on file  Intimate Partner Violence: Not on file    Family History: Family History  Problem Relation Age of Onset   Hypertension Mother    Parkinson's disease Father    Breast cancer Maternal Aunt        dx >50   Lung cancer Paternal Grandfather    Breast cancer Cousin        dx 77s   Prostate cancer Cousin        dx 93s    Current Medications:  Current Outpatient Medications:    ciclopirox (PENLAC) 8  % solution, Apply topically at bedtime. Apply over nail and surrounding skin. Apply daily over previous coat. After seven (7) days, may remove with alcohol and continue cycle., Disp: 6.6 mL, Rfl: 0   meloxicam (MOBIC) 15 MG tablet, Take 1 tablet (15 mg total) by mouth daily as needed for pain., Disp: 30 tablet, Rfl: 0   predniSONE (DELTASONE) 5 MG tablet, Take 1 tablet (5 mg total) by mouth daily with breakfast. (Patient taking differently: Take 5 mg by mouth daily as needed (pt states he takes when his hands cramp).), Disp: 30 tablet, Rfl: 2   prochlorperazine (COMPAZINE) 10 MG tablet, Take 1 tablet (10 mg total) by mouth every 6 (six) hours as needed for nausea or vomiting., Disp: 30 tablet, Rfl: 6   cabozantinib (CABOMETYX) 60 MG tablet, Take 1 tablet (60 mg total) by mouth daily. Take on an empty stomach, 1 hour before or 2 hours after meals., Disp: 30 tablet, Rfl: 2   traMADol (ULTRAM) 50 MG tablet, Take 1 tablet (50 mg total) by mouth every 8 (eight) hours as needed. (Patient not taking: Reported on 08/01/2022), Disp: 30 tablet, Rfl: 0 No current facility-administered medications for this visit.  Facility-Administered Medications Ordered in Other Visits:    diphenhydrAMINE (BENADRYL) 50 MG/ML injection, , , ,    famotidine (PEPCID) 20-0.9 MG/50ML-% IVPB, , , ,    sodium chloride flush (NS) 0.9 % injection 10 mL, 10 mL, Intravenous, PRN, Derek Jack, MD, 10 mL at 08/01/22 1338   Allergies: Allergies  Allergen Reactions   Iodine     Throat felt like closed Was in hospital for allergy and almost died- per patient     REVIEW OF SYSTEMS:   Review of Systems  Constitutional:  Negative for chills, fatigue and fever.  HENT:   Negative for lump/mass, mouth sores, nosebleeds, sore throat and trouble swallowing.   Eyes:  Negative for eye problems.  Respiratory:  Negative for cough and shortness of breath.   Cardiovascular:  Negative for chest pain, leg swelling and palpitations.   Gastrointestinal:  Negative for abdominal pain, constipation, diarrhea, nausea and vomiting.  Genitourinary:  Negative for bladder incontinence, difficulty urinating, dysuria, frequency, hematuria and nocturia.   Musculoskeletal:  Negative for arthralgias, back pain, flank pain, myalgias and neck pain.  Skin:  Negative for itching and rash.  Neurological:  Negative for dizziness, headaches and numbness.  Hematological:  Does not bruise/bleed easily.  Psychiatric/Behavioral:  Negative for depression, sleep disturbance and suicidal ideas. The patient is not nervous/anxious.   All other systems reviewed and are negative.    VITALS:   There were no vitals taken for this visit.  Wt Readings from Last 3 Encounters:  08/01/22 171 lb 12.8 oz (77.9 kg)  07/15/22 180 lb (81.6 kg)  07/04/22 179 lb 1.6 oz (81.2 kg)    There is no height or weight on file to calculate BMI.  Performance status (ECOG): 1 - Symptomatic but completely ambulatory  PHYSICAL EXAM:   Physical Exam Vitals and nursing note reviewed. Exam conducted with a chaperone present.  Constitutional:      Appearance: Normal appearance.  Cardiovascular:     Rate and Rhythm: Normal rate and regular rhythm.     Pulses: Normal pulses.     Heart sounds: Normal heart sounds.  Pulmonary:     Effort: Pulmonary effort is normal.     Breath sounds: Normal breath sounds.  Abdominal:     Palpations: Abdomen is soft. There is no hepatomegaly, splenomegaly or mass.     Tenderness: There is no abdominal tenderness.  Musculoskeletal:     Right lower leg: No edema.     Left lower leg: No edema.  Lymphadenopathy:     Cervical: No cervical adenopathy.     Right cervical: No superficial, deep or posterior cervical adenopathy.    Left cervical: No superficial, deep or posterior cervical adenopathy.     Upper Body:     Right upper body: No supraclavicular or axillary adenopathy.     Left upper body: No supraclavicular or axillary  adenopathy.  Neurological:     General: No focal deficit present.     Mental Status: He is alert and oriented to person, place, and time.  Psychiatric:        Mood and Affect: Mood normal.        Behavior: Behavior normal.     LABS:      Latest Ref Rng & Units 08/01/2022   11:28 AM 07/15/2022    1:22 PM 07/04/2022   11:43 AM  CBC  WBC 4.0 - 10.5 K/uL 12.1  10.4  11.6   Hemoglobin 13.0 - 17.0 g/dL 12.4  11.9  12.8   Hematocrit 39.0 - 52.0 % 38.9  37.4  39.9   Platelets 150 - 400 K/uL 393  319  270       Latest Ref Rng & Units 08/01/2022   11:28 AM 07/15/2022    1:22 PM 07/04/2022   11:43 AM  CMP  Glucose 70 - 99 mg/dL 133  94  95   BUN 6 - 20 mg/dL 15  20  23   $ Creatinine 0.61 - 1.24 mg/dL 1.30  1.27  1.28   Sodium 135 - 145 mmol/L 137  137  134   Potassium 3.5 - 5.1 mmol/L 3.9  3.8  3.8   Chloride 98 - 111 mmol/L 100  101  101   CO2 22 - 32 mmol/L 26  25  25   $ Calcium 8.9 - 10.3 mg/dL 8.5  8.6  8.4   Total Protein 6.5 - 8.1 g/dL 7.3  7.5  7.8   Total Bilirubin 0.3 - 1.2 mg/dL 0.6  0.4  0.3   Alkaline Phos 38 - 126 U/L 61  67  69   AST 15 - 41 U/L 25  19  15   ALT 0 - 44 U/L 25  21  19      $ No results found for: "CEA1", "CEA" / No results found for: "CEA1", "CEA" Lab Results  Component Value Date   PSA1 1.3 08/05/2021   No results found for: "[id number]" No results found for: "CAN125"  No results found for: "TOTALPROTELP", "ALBUMINELP", "A1GS", "A2GS", "BETS", "BETA2SER", "GAMS", "MSPIKE", "SPEI" No results found for: "TIBC", "FERRITIN", "IRONPCTSAT" Lab Results  Component Value Date   LDH 144 11/04/2021     STUDIES:   NM PET Image Restag (PS) Skull Base To Thigh  Result Date: 07/29/2022 CLINICAL DATA:  Subsequent treatment strategy for metastatic renal cell carcinoma. EXAM: NUCLEAR MEDICINE PET SKULL BASE TO THIGH TECHNIQUE: 9.02 mCi F-18 FDG was injected intravenously. Full-ring PET imaging was performed from the skull base to thigh after the radiotracer. CT data  was obtained and used for attenuation correction and anatomic localization. Fasting blood glucose: 113 mg/dl COMPARISON:  PET-CT 04/28/2022 and CT from 07/15/2022. FINDINGS: Mediastinal blood pool activity: SUV max 2.40 Liver activity: SUV max NA NECK: No hypermetabolic lymph nodes in the neck. Incidental CT findings: None. CHEST: -New tracer avid left supraclavicular lymph node measures 8 mm and has an SUV max of 11.35 -New left internal mammary lymph node measures 6 mm short axis and has an SUV max of 3.57, image 144/3. -New bilateral tracer avid hilar adenopathy, image 131/3. SUV max within the right hilum is equal to 8.10. SUV max in the left hilum is equal to 8.65. -New tracer avid subcarinal lymph node measures 1 cm and has an SUV max of 9.53, image 131/3. -index right retrocrural lymph node measures 1.5 cm and has an SUV max of 10.75, image 188/3. On the previous exam this measured 1 cm within SUV max of 4.7. No tracer avid pulmonary nodules. Incidental CT findings: Emphysema. Aortic atherosclerosis and coronary artery calcifications. ABDOMEN/PELVIS: New left adrenal mass measures 4.7 x 3.9 cm with SUV max of 12.29, image 184/3. On the previous PET-CT there was a 1 cm left adrenal nodule within SUV max of 2.7. Interval increase in size of the left pole kidney mass which measures 8.0 cm with signs of internal necrosis, image 194/3. On the previous PET-CT this mass measured 5.5 cm. Nodule within SUV max of 2.70. Significant progression of retroperitoneal adenopathy. -Nodal conglomeration at the level of the left renal vein measures 4.6 x 3.5 cm and has an SUV max of 13.92, image 203/3. On the previous exam this measured 1.5 cm with an SUV max of 7.72. -Right retrocaval nodal conglomeration measures 4.3 x 2.0 cm within SUV max of 13.92, image 207/3. On the previous exam this measured 2.1 x 1.3 cm with SUV max 5.01. -new right common iliac lymph node measures 1.2 cm within SUV max of 4.24, image 249/3 Signs of  extensive peritoneal disease identified within the abdomen and pelvis. -diffuse tracer avid soft tissue infiltration throughout the omentum with SUV max of 7.42, image 221/3. -Tumor rind extends into the left upper quadrant of the abdomen and partially encases the spleen and left lobe of liver. Index tumor along the inferior margin of the spleen measures 5.7 x 3.3 cm and has an SUV max of 12.46, image 185/3. New abdominopelvic ascites. Incidental CT findings: Aortic atherosclerosis. SKELETON: Signs of multifocal tracer avid bone metastases. -Index lesion involving the T6 vertebra has an SUV max of 12.53, image 128/3. On the corresponding CT images there are no lytic or sclerotic changes  identified. -Small focus of increased uptake within the left base of skull is new from the previous exam with SUV max of 4.88, image 47/3. No corresponding lytic or sclerotic bone lesions identified on the CT images. -Tracer uptake within the left transverse process of the T5 vertebra noted, image 117/3. Incidental CT findings: None. IMPRESSION: 1. Significant interval progression of disease. 2. Interval development of extensive tracer avid peritoneal disease 3. Interval progression of tracer avid abdominopelvic adenopathy. New tracer avid bilateral hilar, mediastinal, left supraclavicular and left internal mammary lymph nodes 4. New multifocal tracer avid bone metastases. No corresponding lytic or sclerotic changes identified on the CT images. 5. Interval increase in size of tracer avid left kidney mass. 6.  Aortic Atherosclerosis (ICD10-I70.0). Electronically Signed   By: Kerby Moors M.D.   On: 07/29/2022 09:12   CT CHEST ABDOMEN PELVIS WO CONTRAST  Result Date: 07/15/2022 CLINICAL DATA:  Left-sided abdominal pain, history of metastatic left renal cell carcinoma * Tracking Code: BO * EXAM: CT CHEST, ABDOMEN AND PELVIS WITHOUT CONTRAST TECHNIQUE: Multidetector CT imaging of the chest, abdomen and pelvis was performed following  the standard protocol without IV contrast. RADIATION DOSE REDUCTION: This exam was performed according to the departmental dose-optimization program which includes automated exposure control, adjustment of the mA and/or kV according to patient size and/or use of iterative reconstruction technique. COMPARISON:  PET-CT, 04/28/2022 FINDINGS: CT CHEST FINDINGS Cardiovascular: Right chest port catheter. Aortic atherosclerosis. Normal heart size. Left coronary artery calcifications. No pericardial effusion. Mediastinum/Nodes: No enlarged mediastinal, hilar, or axillary lymph nodes. Thyroid gland, trachea, and esophagus demonstrate no significant findings. Lungs/Pleura: Mild centrilobular emphysema diffuse bilateral bronchial wall thickening. No pleural effusion or pneumothorax. Musculoskeletal: No chest wall abnormality. No acute osseous findings. CT ABDOMEN PELVIS FINDINGS Hepatobiliary: No solid liver abnormality is seen. No gallstones, gallbladder wall thickening, or biliary dilatation. Pancreas: Unremarkable. No pancreatic ductal dilatation or surrounding inflammatory changes. Spleen: Normal in size without significant abnormality. Adrenals/Urinary Tract: Significant interval enlargement of left retroperitoneal mass, measuring 3.7 x 3.6 cm, previously 1.0 cm (series 2, image 60). Very edematous appearance of the left kidney with extensive adjacent perinephric fat stranding. Slightly enlarged appearance of an ill-defined hypodense mass of the superior pole of the left kidney measuring 6.5 x 4.3 cm kidney (series 2, image 60). New, moderate left hydronephrosis and proximal hydroureter with high attenuation within the calices. No obstructing calculus or other obstructing ureteral etiology identified to the left ureterovesicular junction. There may be obstruction of the mid left ureter secondary to matted soft tissue in the retroperitoneum (series 2, image 84). Mild right-sided hydronephrosis, likewise new compared to  prior examination and again without calculus or other definite obstructing etiology identified. Punctuate nonobstructive calculus of the inferior pole of the left kidney (series 2, image 78). Bladder is unremarkable. Stomach/Bowel: Stomach is within normal limits. Appendix appears normal. No evidence of bowel wall thickening, distention, or inflammatory changes. Descending and sigmoid diverticulosis. Vascular/Lymphatic: Aortic atherosclerosis. Significant interval enlargement of bulky, matted retroperitoneal lymphadenopathy, index left retroperitoneal node measuring 2.3 x 1.8 cm, previously 1.7 x 1.5 cm (series 2, image 74). Reproductive: No mass or other abnormality. Other: No abdominal wall hernia or abnormality. No ascites. Musculoskeletal: No acute osseous findings. Patient's known osseous metastatic disease is not well appreciated by CT. IMPRESSION: 1. Very edematous appearance of the left kidney with extensive adjacent perinephric fat stranding. New, moderate left hydronephrosis and proximal hydroureter with high attenuation within the calices suggesting blood product or clot. No obstructing calculus  or other obstructing ureteral etiology identified to the left ureterovesicular junction. There may be obstruction of the mid left ureter secondary to matted soft lymphadenopathy and tissue in the retroperitoneum. 2. Mild right hydronephrosis, new compared to prior examination, without directly visualized obstructing etiology. 3. Slightly enlarged appearance of an ill-defined hypodense mass of the superior pole of the left kidney measuring 6.5 x 4.3 cm, consistent with known renal cell carcinoma. 4. Significant interval enlargement of bulky, matted retroperitoneal lymphadenopathy, consistent with worsened nodal metastatic disease. 5. Patient's known osseous metastatic disease is not well appreciated by CT. 6. Emphysema and diffuse bilateral bronchial wall thickening. 7. Coronary artery disease. Aortic  Atherosclerosis (ICD10-I70.0) and Emphysema (ICD10-J43.9). Electronically Signed   By: Delanna Ahmadi M.D.   On: 07/15/2022 17:03

## 2022-08-01 NOTE — Telephone Encounter (Signed)
Oral Oncology Patient Advocate Encounter  New authorization   Received notification that prior authorization for Cabometyx is required.   PA submitted on 08/01/22 via [email or web address]  Key [id number]  Status is pending     Berdine Addison, [facility] [place]  [phone] (phone) [phone] (fax) 08/01/2022 2:31 PM

## 2022-08-02 ENCOUNTER — Other Ambulatory Visit (HOSPITAL_COMMUNITY): Payer: Self-pay

## 2022-08-03 ENCOUNTER — Other Ambulatory Visit: Payer: Self-pay

## 2022-08-03 ENCOUNTER — Other Ambulatory Visit (HOSPITAL_COMMUNITY): Payer: Self-pay

## 2022-08-03 ENCOUNTER — Telehealth: Payer: Self-pay

## 2022-08-03 LAB — T4: T4, Total: 7.6 ug/dL (ref 4.5–12.0)

## 2022-08-03 NOTE — Telephone Encounter (Signed)
Oral Oncology Patient Advocate Encounter  Prior Authorization for Cabometyx has been approved.    PA# [id number]  Effective dates: 08/02/22 until further notice  Patients co-pay is $0.00.    Berdine Addison, [facility] [place]  [phone] (phone) [phone] (fax) 08/03/2022 8:39 AM

## 2022-08-03 NOTE — Telephone Encounter (Signed)
Patient returned call and has been OnBoarded.

## 2022-08-03 NOTE — Telephone Encounter (Signed)
Called patient to set up delivery and On Board. No answer and no VM set up to leave a message. I will continue to try reaching patient.

## 2022-08-03 NOTE — Telephone Encounter (Signed)
Oral Chemotherapy Pharmacist Encounter  Patient Education I spoke with patient for overview of new oral chemotherapy medication: Cabometyx (cabozantinib) for the treatment of renal cell carcinoma, planned duration until disease progression or unacceptable drug toxicity.   Pt is doing well. Counseled patient on administration, dosing, side effects, monitoring, drug-food interactions, safe handling, storage, and disposal. Patient will take 1 tablet (79m) by mouth daily on an empty stomach, 1 hour before or 2 hours after a meal.  Side effects include but not limited to:  Diarrhea: recommended patient take loperamide with loose stools. Instructed to call the office if having 4 or more loose stools per day.  Nausea/vomiting: prescription for prochlorperazine was sent in to patient's pharmacy. Patient instructed to take every 6 hours as needed, but to call the office if he notices he is taking regularly. Hand Foot Syndrome: instructed patient to moisturize on a regular basis and to call the office if he notices a rash on his hands or feet. Mouth irritation: patient had previously experienced mouth irritation while on immunotherapy. Instructed him to use a baking soda/salt/warm water rinse and to avoid mouthwashes with alcohol. Patient instructed to call if mouth pain affected his ability to eat. Impaired wound healing: patient advised to let all providers know that he is starting cabozantinib, as he may need to hold for any future procedures (dental work planned in the future).  Reviewed with patient importance of keeping a medication schedule and plan for any missed doses.  After discussion with patient there are no patient barriers to medication adherence identified.   Mr. MTrejosvoiced understanding and appreciation. All questions answered. Medication handout provided.  [facility]. Patient knows to call the office with questions or  concerns. Oral Chemotherapy [facility] will continue to follow.  KLaray Anger PharmD PGY-2 Pharmacy Resident Hematology/Oncology [phone] 08/03/2022 10:44 AM

## 2022-08-04 ENCOUNTER — Other Ambulatory Visit (HOSPITAL_COMMUNITY): Payer: Self-pay

## 2022-08-05 ENCOUNTER — Other Ambulatory Visit (HOSPITAL_COMMUNITY): Payer: Self-pay

## 2022-08-08 ENCOUNTER — Other Ambulatory Visit: Payer: Self-pay

## 2022-08-08 ENCOUNTER — Encounter: Payer: Self-pay | Admitting: Family Medicine

## 2022-08-08 ENCOUNTER — Inpatient Hospital Stay: Payer: Commercial Managed Care - PPO | Admitting: Licensed Clinical Social Worker

## 2022-08-08 ENCOUNTER — Ambulatory Visit (INDEPENDENT_AMBULATORY_CARE_PROVIDER_SITE_OTHER): Payer: Commercial Managed Care - PPO | Admitting: Family Medicine

## 2022-08-08 VITALS — BP 129/86 | HR 81 | Temp 98.1°F | Ht 71.0 in | Wt 174.0 lb

## 2022-08-08 DIAGNOSIS — J439 Emphysema, unspecified: Secondary | ICD-10-CM

## 2022-08-08 DIAGNOSIS — Z0001 Encounter for general adult medical examination with abnormal findings: Secondary | ICD-10-CM

## 2022-08-08 DIAGNOSIS — Z716 Tobacco abuse counseling: Secondary | ICD-10-CM

## 2022-08-08 DIAGNOSIS — F339 Major depressive disorder, recurrent, unspecified: Secondary | ICD-10-CM

## 2022-08-08 DIAGNOSIS — Z0000 Encounter for general adult medical examination without abnormal findings: Secondary | ICD-10-CM

## 2022-08-08 DIAGNOSIS — E782 Mixed hyperlipidemia: Secondary | ICD-10-CM | POA: Diagnosis not present

## 2022-08-08 DIAGNOSIS — L819 Disorder of pigmentation, unspecified: Secondary | ICD-10-CM

## 2022-08-08 DIAGNOSIS — C7951 Secondary malignant neoplasm of bone: Secondary | ICD-10-CM

## 2022-08-08 DIAGNOSIS — C649 Malignant neoplasm of unspecified kidney, except renal pelvis: Secondary | ICD-10-CM

## 2022-08-08 DIAGNOSIS — Z720 Tobacco use: Secondary | ICD-10-CM

## 2022-08-08 DIAGNOSIS — I700 Atherosclerosis of aorta: Secondary | ICD-10-CM

## 2022-08-08 MED ORDER — BREZTRI AEROSPHERE 160-9-4.8 MCG/ACT IN AERO: 2.0000 | INHALATION_SPRAY | Freq: Two times a day (BID) | RESPIRATORY_TRACT | 11 refills | Status: DC

## 2022-08-08 MED ORDER — ALBUTEROL SULFATE HFA 108 (90 BASE) MCG/ACT IN AERS: 2.0000 | INHALATION_SPRAY | Freq: Four times a day (QID) | RESPIRATORY_TRACT | 1 refills | Status: DC | PRN

## 2022-08-08 MED ORDER — ROSUVASTATIN CALCIUM 5 MG PO TABS: 5.0000 mg | ORAL_TABLET | Freq: Every day | ORAL | 3 refills | Status: DC

## 2022-08-08 NOTE — Progress Notes (Signed)
[place] CSW Progress Note  Clinical Social Worker  was contacted by pt inquiring about dropping off paperwork for financial resources.  Pt to drop off proof of income to apply for the [employer] as well as a signed referral for the [facility] to apply for disability.  CSW to submit [facility] application on behalf of pt upon receipt.  Pt states he also has a death benefit through work that will pay out if the required documentation is completed by the oncologist stating his life expectancy is less than 1 year.  Pt to leave death benefit paperwork for Dr. Raliegh Ip to complete.        Jacob Combs, LCSW

## 2022-08-08 NOTE — Patient Instructions (Signed)
Health Maintenance, Male Adopting a healthy lifestyle and getting preventive care are important in promoting health and wellness. Ask your health care provider about: The right schedule for you to have regular tests and exams. Things you can do on your own to prevent diseases and keep yourself healthy. What should I know about diet, weight, and exercise? Eat a healthy diet  Eat a diet that includes plenty of vegetables, fruits, low-fat dairy products, and lean protein. Do not eat a lot of foods that are high in solid fats, added sugars, or sodium. Maintain a healthy weight Body mass index (BMI) is a measurement that can be used to identify possible weight problems. It estimates body fat based on height and weight. Your health care provider can help determine your BMI and help you achieve or maintain a healthy weight. Get regular exercise Get regular exercise. This is one of the most important things you can do for your health. Most adults should: Exercise for at least 150 minutes each week. The exercise should increase your heart rate and make you sweat (moderate-intensity exercise). Do strengthening exercises at least twice a week. This is in addition to the moderate-intensity exercise. Spend less time sitting. Even light physical activity can be beneficial. Watch cholesterol and blood lipids Have your blood tested for lipids and cholesterol at 57 years of age, then have this test every 5 years. You may need to have your cholesterol levels checked more often if: Your lipid or cholesterol levels are high. You are older than 57 years of age. You are at high risk for heart disease. What should I know about cancer screening? Many types of cancers can be detected early and may often be prevented. Depending on your health history and family history, you may need to have cancer screening at various ages. This may include screening for: Colorectal cancer. Prostate cancer. Skin cancer. Lung  cancer. What should I know about heart disease, diabetes, and high blood pressure? Blood pressure and heart disease High blood pressure causes heart disease and increases the risk of stroke. This is more likely to develop in people who have high blood pressure readings or are overweight. Talk with your health care provider about your target blood pressure readings. Have your blood pressure checked: Every 3-5 years if you are 18-39 years of age. Every year if you are 40 years old or older. If you are between the ages of 65 and 75 and are a current or former smoker, ask your health care provider if you should have a one-time screening for abdominal aortic aneurysm (AAA). Diabetes Have regular diabetes screenings. This checks your fasting blood sugar level. Have the screening done: Once every three years after age 45 if you are at a normal weight and have a low risk for diabetes. More often and at a younger age if you are overweight or have a high risk for diabetes. What should I know about preventing infection? Hepatitis B If you have a higher risk for hepatitis B, you should be screened for this virus. Talk with your health care provider to find out if you are at risk for hepatitis B infection. Hepatitis C Blood testing is recommended for: Everyone born from 1945 through 1965. Anyone with known risk factors for hepatitis C. Sexually transmitted infections (STIs) You should be screened each year for STIs, including gonorrhea and chlamydia, if: You are sexually active and are younger than 57 years of age. You are older than 57 years of age and your   health care provider tells you that you are at risk for this type of infection. Your sexual activity has changed since you were last screened, and you are at increased risk for chlamydia or gonorrhea. Ask your health care provider if you are at risk. Ask your health care provider about whether you are at high risk for HIV. Your health care provider  may recommend a prescription medicine to help prevent HIV infection. If you choose to take medicine to prevent HIV, you should first get tested for HIV. You should then be tested every 3 months for as long as you are taking the medicine. Follow these instructions at home: Alcohol use Do not drink alcohol if your health care provider tells you not to drink. If you drink alcohol: Limit how much you have to 0-2 drinks a day. Know how much alcohol is in your drink. In the [place] one drink equals one 12 oz bottle of beer (355 mL), one 5 oz glass of wine (148 mL), or one 1 oz glass of hard liquor (44 mL). Lifestyle Do not use any products that contain nicotine or tobacco. These products include cigarettes, chewing tobacco, and vaping devices, such as e-cigarettes. If you need help quitting, ask your health care provider. Do not use street drugs. Do not share needles. Ask your health care provider for help if you need support or information about quitting drugs. General instructions Schedule regular health, dental, and eye exams. Stay current with your vaccines. Tell your health care provider if: You often feel depressed. You have ever been abused or do not feel safe at home. Summary Adopting a healthy lifestyle and getting preventive care are important in promoting health and wellness. Follow your health care provider's instructions about healthy diet, exercising, and getting tested or screened for diseases. Follow your health care provider's instructions on monitoring your cholesterol and blood pressure. This information is not intended to replace advice given to you by your health care provider. Make sure you discuss any questions you have with your health care provider. Document Revised: 10/19/2020 Document Reviewed: 10/19/2020 Elsevier Patient Education  2023 Elsevier Inc.  

## 2022-08-08 NOTE — Progress Notes (Signed)
Complete physical exam  Patient: Jacob Reynolds   DOB: [birth year]   57 y.o. Male  MRN: [id number]  Subjective:    Chief Complaint  Patient presents with   Annual Exam    Jacob Reynolds is a 57 y.o. male who presents today for a complete physical exam. He reports consuming a general diet. The patient has a physically strenuous job, but has no regular exercise apart from work.  He generally feels fairly well. He reports sleeping fairly well. He does have additional problems to discuss today.   Skin lesion Skin lesion to left lower leg for 3 years. It it crusted and gray in color. He needs a new referral to dermatology.   COPD  He reports wheezing a few times a month. He reports shortness of breath with activity. Denies chronic cough.   HLD He is not fasting this morning. He is not on a statin, but is willing to start one as imaging showed aortic atherosclerosis.   Depression Reports related to cancer dx and the stress due to this. He feels like symptoms are manageable and is not interested in treatment at this time.   Cancer Managed by oncology. Cancer has now spread to his abdomen. This is cause abdominal pain and bloating. He is feeling better since started compazine. He also reports fatigue and back pain due to cancer.   Tobacco abuse He is trying to cut back but is not quite ready to quit.   Most recent fall risk assessment:    07/27/2021    9:59 AM  Fall Risk   Falls in the past year? 0     Most recent depression screenings:    08/05/2021    8:26 AM 07/27/2021    9:58 AM  PHQ 2/9 Scores  PHQ - 2 Score 5 5  PHQ- 9 Score 14 12    Vision:Not within last year  and Dental: No current dental problems and No regular dental care   Past Medical History:  Diagnosis Date   Plantar fasciitis    Port-A-Cath in place 11/10/2021      Patient Care Team: [name], FNP as PCP - General (Family Medicine) Brien Mates, RN as Oncology Nurse Navigator ([place]) Derek Jack, MD as Medical Oncologist (Medical Oncology)   Outpatient Medications Prior to Visit  Medication Sig   cabozantinib (CABOMETYX) 60 MG tablet Take 1 tablet (60 mg total) by mouth daily. Take on an empty stomach, 1 hour before or 2 hours after meals.   meloxicam (MOBIC) 15 MG tablet Take 1 tablet (15 mg total) by mouth daily as needed for pain.   traMADol (ULTRAM) 50 MG tablet Take 1 tablet (50 mg total) by mouth every 8 (eight) hours as needed.   prochlorperazine (COMPAZINE) 10 MG tablet Take 1 tablet (10 mg total) by mouth every 6 (six) hours as needed for nausea or vomiting. (Patient not taking: Reported on 08/08/2022)   [DISCONTINUED] ciclopirox (PENLAC) 8 % solution Apply topically at bedtime. Apply over nail and surrounding skin. Apply daily over previous coat. After seven (7) days, may remove with alcohol and continue cycle.   [DISCONTINUED] predniSONE (DELTASONE) 5 MG tablet Take 1 tablet (5 mg total) by mouth daily with breakfast. (Patient taking differently: Take 5 mg by mouth daily as needed (pt states he takes when his hands cramp).)   Facility-Administered Medications Prior to Visit  Medication Dose Route Frequency Provider   diphenhydrAMINE (BENADRYL) 50 MG/ML injection  famotidine (PEPCID) 20-0.9 MG/50ML-% IVPB         ROS Negative unless specially indicated above in HPI.     Objective:     BP 129/86   Pulse 81   Temp 98.1 F (36.7 C) (Temporal)   Ht '5\' 11"'$  (1.803 m)   Wt 174 lb (78.9 kg)   SpO2 94%   BMI 24.27 kg/m    Physical Exam Vitals and nursing note reviewed.  Constitutional:      General: He is not in acute distress.    Appearance: Normal appearance. He is not ill-appearing, toxic-appearing or diaphoretic.  HENT:     Head: Normocephalic.     Right Ear: Tympanic membrane, ear canal and external ear normal.     Left Ear: Tympanic membrane, ear canal and external ear normal.     Nose: Nose normal.     Mouth/Throat:      Mouth: Mucous membranes are moist.     Pharynx: Oropharynx is clear.  Eyes:     Extraocular Movements: Extraocular movements intact.     Conjunctiva/sclera: Conjunctivae normal.     Pupils: Pupils are equal, round, and reactive to light.  Cardiovascular:     Rate and Rhythm: Normal rate and regular rhythm.     Pulses: Normal pulses.     Heart sounds: Normal heart sounds. No murmur heard.    No friction rub. No gallop.  Pulmonary:     Effort: Pulmonary effort is normal.     Breath sounds: Normal breath sounds.  Abdominal:     General: Bowel sounds are normal. There is distension.     Palpations: Abdomen is soft.     Tenderness: There is generalized abdominal tenderness. There is no guarding or rebound.  Musculoskeletal:     Cervical back: No rigidity.     Right lower leg: No edema.     Left lower leg: No edema.  Lymphadenopathy:     Cervical: No cervical adenopathy.  Skin:    General: Skin is warm and dry.     Capillary Refill: Capillary refill takes less than 2 seconds.     Findings: Lesion (2 cm crusted, gray lesion to anterior left lower leg) present. No rash.  Neurological:     General: No focal deficit present.     Mental Status: He is alert and oriented to person, place, and time.     Motor: No weakness.     Gait: Gait normal.  Psychiatric:        Mood and Affect: Mood normal.        Behavior: Behavior normal.        Thought Content: Thought content normal.      No results found for any visits on 08/08/22.     Assessment & Plan:    Routine Health Maintenance and Physical Exam  July was seen today for annual exam.  Diagnoses and all orders for this visit:  Routine general medical examination at a health care facility  Mixed hyperlipidemia He will start crestor as below. He is not fasting today for labs.  -     Lipid panel; Future -     rosuvastatin (CRESTOR) 5 MG tablet; Take 1 tablet (5 mg total) by mouth daily.  Metastatic renal cell carcinoma to  bone [facility]) Managed by oncology. Has also spread to abdomen now.   Pulmonary emphysema, unspecified emphysema type ([place]) Uncontrolled. Start breztri. Albuterol prn.  -     albuterol (VENTOLIN HFA) 108 (90 Base)  MCG/ACT inhaler; Inhale 2 puffs into the lungs every 6 (six) hours as needed for wheezing or shortness of breath. -     Budeson-Glycopyrrol-Formoterol (BREZTRI AEROSPHERE) 160-9-4.8 MCG/ACT AERO; Inhale 2 puffs into the lungs 2 (two) times daily.  Depression, recurrent ([place]) Fair controlled. Denies SI. Declined treatment today.   Aortic atherosclerosis (HCC) Start statin. Discussed aspirin.  -     rosuvastatin (CRESTOR) 5 MG tablet; Take 1 tablet (5 mg total) by mouth daily.  Tobacco abuse Tobacco abuse counseling 3 mins of Smoking cessation instruction/counseling given:  counseled patient on the dangers of tobacco use, advised patient to stop smoking, and reviewed strategies to maximize success  Atypical pigmented skin lesion -     Ambulatory referral to Dermatology    Immunization History  Administered Date(s) Administered   Influenza,inj,Quad PF,6+ Mos 03/27/2015, 05/03/2022   Tdap 04/29/2013    Health Maintenance  Topic Date Due   COVID-19 Vaccine (1) 08/24/2022 (Originally 03/28/1971)   Zoster Vaccines- Shingrix (1 of 2) 11/06/2022 (Originally [birth year]/27/1985)   Fecal DNA (Cologuard)  08/09/2023 (Originally 03/28/2011)   DTaP/Tdap/Td (2 - Td or Tdap) 04/30/2023   Lung Cancer Screening  07/16/2023   INFLUENZA VACCINE  Completed   Hepatitis C Screening  Completed   HIV Screening  Completed   HPV VACCINES  Aged Out    Discussed health benefits of physical activity, and encouraged him to engage in regular exercise appropriate for his age and condition.  Problem List Items Addressed This Visit       Cardiovascular and Mediastinum   Aortic atherosclerosis ([place])   Relevant Medications   rosuvastatin (CRESTOR) 5 MG tablet     Respiratory   Pulmonary emphysema (HCC)    Relevant Medications   albuterol (VENTOLIN HFA) 108 (90 Base) MCG/ACT inhaler   Budeson-Glycopyrrol-Formoterol (BREZTRI AEROSPHERE) 160-9-4.8 MCG/ACT AERO     Musculoskeletal and Integument   Metastatic renal cell carcinoma to bone (HCC)     Other   Depression, major, single episode, mild (HCC)   Mixed hyperlipidemia   Relevant Medications   rosuvastatin (CRESTOR) 5 MG tablet   Other Relevant Orders   Lipid panel   Tobacco abuse   Other Visit Diagnoses     Routine general medical examination at a health care facility    -  Primary   Tobacco abuse counseling       Atypical pigmented skin lesion       Relevant Orders   Ambulatory referral to Dermatology      Return in 3 months (on 11/06/2022) for chronic follow up.  The patient indicates understanding of these issues and agrees with the plan.    [name], FNP

## 2022-08-09 ENCOUNTER — Telehealth: Payer: Self-pay | Admitting: Hematology

## 2022-08-09 NOTE — Telephone Encounter (Signed)
Received pts assist form and fin docs from pt. Enrolled him into the [employer]. Effective 08/09/22

## 2022-08-10 NOTE — Progress Notes (Signed)
[employer] Benefit Claim Form faxed with confirmation received.    Fax no. [phone]

## 2022-08-12 ENCOUNTER — Inpatient Hospital Stay: Payer: Commercial Managed Care - PPO | Attending: Hematology | Admitting: Licensed Clinical Social Worker

## 2022-08-12 DIAGNOSIS — M546 Pain in thoracic spine: Secondary | ICD-10-CM | POA: Insufficient documentation

## 2022-08-12 DIAGNOSIS — R519 Headache, unspecified: Secondary | ICD-10-CM | POA: Insufficient documentation

## 2022-08-12 DIAGNOSIS — R110 Nausea: Secondary | ICD-10-CM | POA: Insufficient documentation

## 2022-08-12 DIAGNOSIS — R197 Diarrhea, unspecified: Secondary | ICD-10-CM | POA: Insufficient documentation

## 2022-08-12 DIAGNOSIS — Z8042 Family history of malignant neoplasm of prostate: Secondary | ICD-10-CM | POA: Insufficient documentation

## 2022-08-12 DIAGNOSIS — C786 Secondary malignant neoplasm of retroperitoneum and peritoneum: Secondary | ICD-10-CM | POA: Insufficient documentation

## 2022-08-12 DIAGNOSIS — I10 Essential (primary) hypertension: Secondary | ICD-10-CM | POA: Insufficient documentation

## 2022-08-12 DIAGNOSIS — Z79899 Other long term (current) drug therapy: Secondary | ICD-10-CM | POA: Insufficient documentation

## 2022-08-12 DIAGNOSIS — G893 Neoplasm related pain (acute) (chronic): Secondary | ICD-10-CM | POA: Insufficient documentation

## 2022-08-12 DIAGNOSIS — C7951 Secondary malignant neoplasm of bone: Secondary | ICD-10-CM | POA: Insufficient documentation

## 2022-08-12 DIAGNOSIS — C649 Malignant neoplasm of unspecified kidney, except renal pelvis: Secondary | ICD-10-CM

## 2022-08-12 DIAGNOSIS — R6884 Jaw pain: Secondary | ICD-10-CM | POA: Insufficient documentation

## 2022-08-12 DIAGNOSIS — Z803 Family history of malignant neoplasm of breast: Secondary | ICD-10-CM | POA: Insufficient documentation

## 2022-08-12 DIAGNOSIS — Z801 Family history of malignant neoplasm of trachea, bronchus and lung: Secondary | ICD-10-CM | POA: Insufficient documentation

## 2022-08-12 DIAGNOSIS — F17210 Nicotine dependence, cigarettes, uncomplicated: Secondary | ICD-10-CM | POA: Insufficient documentation

## 2022-08-12 DIAGNOSIS — C642 Malignant neoplasm of left kidney, except renal pelvis: Secondary | ICD-10-CM | POA: Insufficient documentation

## 2022-08-12 NOTE — Progress Notes (Signed)
[place] CSW Progress Note  Clinical Social Worker  submitted referral to the [employer] to apply for long term disability on behalf of pt.  CSW to continue to provide support as appropriate throughout duration of treatment.        Henriette Combs, LCSW

## 2022-08-17 ENCOUNTER — Inpatient Hospital Stay: Payer: Commercial Managed Care - PPO | Admitting: Licensed Clinical Social Worker

## 2022-08-17 DIAGNOSIS — C649 Malignant neoplasm of unspecified kidney, except renal pelvis: Secondary | ICD-10-CM

## 2022-08-17 NOTE — Progress Notes (Signed)
[place] CSW Progress Note  Holiday representative  received a call from pt informing his application was declined at work for a death benefit.  Pt inquiring about applying for Medicaid  as he will not be able to continue to work.  Pt reports he found out last week he was approved for social security disability.  According to pt he had applied a long time ago and assumed he was denied which prompted him to request the referral be sent to the [facility] to restart the process.  CSW sent an email to the [place] they can disregard the referral.  Pt encouraged to make an appointment with DSS to apply in person for Medicaid to ensure his application is completed to include all benefits he may be eligible for.  Pt will also contact billing to apply for a [place] [employer] to assist with medical bills.  CSW to remain available to provide support as appropriate throughout duration of treatment.     Henriette Combs, LCSW

## 2022-08-22 ENCOUNTER — Inpatient Hospital Stay (HOSPITAL_BASED_OUTPATIENT_CLINIC_OR_DEPARTMENT_OTHER): Payer: Commercial Managed Care - PPO | Admitting: Hematology

## 2022-08-22 ENCOUNTER — Inpatient Hospital Stay: Payer: Commercial Managed Care - PPO

## 2022-08-22 VITALS — BP 145/97

## 2022-08-22 VITALS — BP 145/97 | HR 65 | Temp 96.7°F | Resp 16 | Wt 174.8 lb

## 2022-08-22 DIAGNOSIS — M899 Disorder of bone, unspecified: Secondary | ICD-10-CM

## 2022-08-22 DIAGNOSIS — Z95828 Presence of other vascular implants and grafts: Secondary | ICD-10-CM

## 2022-08-22 DIAGNOSIS — Z79899 Other long term (current) drug therapy: Secondary | ICD-10-CM | POA: Diagnosis not present

## 2022-08-22 DIAGNOSIS — C642 Malignant neoplasm of left kidney, except renal pelvis: Secondary | ICD-10-CM | POA: Diagnosis not present

## 2022-08-22 DIAGNOSIS — C649 Malignant neoplasm of unspecified kidney, except renal pelvis: Secondary | ICD-10-CM

## 2022-08-22 DIAGNOSIS — G893 Neoplasm related pain (acute) (chronic): Secondary | ICD-10-CM | POA: Diagnosis not present

## 2022-08-22 DIAGNOSIS — Z801 Family history of malignant neoplasm of trachea, bronchus and lung: Secondary | ICD-10-CM | POA: Diagnosis not present

## 2022-08-22 DIAGNOSIS — R197 Diarrhea, unspecified: Secondary | ICD-10-CM | POA: Diagnosis not present

## 2022-08-22 DIAGNOSIS — R519 Headache, unspecified: Secondary | ICD-10-CM | POA: Diagnosis not present

## 2022-08-22 DIAGNOSIS — R6884 Jaw pain: Secondary | ICD-10-CM | POA: Diagnosis not present

## 2022-08-22 DIAGNOSIS — C786 Secondary malignant neoplasm of retroperitoneum and peritoneum: Secondary | ICD-10-CM | POA: Diagnosis not present

## 2022-08-22 DIAGNOSIS — C7951 Secondary malignant neoplasm of bone: Secondary | ICD-10-CM

## 2022-08-22 DIAGNOSIS — Z8042 Family history of malignant neoplasm of prostate: Secondary | ICD-10-CM | POA: Diagnosis not present

## 2022-08-22 DIAGNOSIS — R110 Nausea: Secondary | ICD-10-CM | POA: Diagnosis not present

## 2022-08-22 DIAGNOSIS — Z803 Family history of malignant neoplasm of breast: Secondary | ICD-10-CM | POA: Diagnosis not present

## 2022-08-22 DIAGNOSIS — F17210 Nicotine dependence, cigarettes, uncomplicated: Secondary | ICD-10-CM | POA: Diagnosis not present

## 2022-08-22 DIAGNOSIS — I10 Essential (primary) hypertension: Secondary | ICD-10-CM | POA: Diagnosis not present

## 2022-08-22 DIAGNOSIS — N2889 Other specified disorders of kidney and ureter: Secondary | ICD-10-CM

## 2022-08-22 DIAGNOSIS — M546 Pain in thoracic spine: Secondary | ICD-10-CM | POA: Diagnosis not present

## 2022-08-22 LAB — COMPREHENSIVE METABOLIC PANEL
ALT: 33 U/L (ref 0–44)
AST: 34 U/L (ref 15–41)
Albumin: 2.6 g/dL — ABNORMAL LOW (ref 3.5–5.0)
Alkaline Phosphatase: 80 U/L (ref 38–126)
Anion gap: 8 (ref 5–15)
BUN: 18 mg/dL (ref 6–20)
CO2: 25 mmol/L (ref 22–32)
Calcium: 8.3 mg/dL — ABNORMAL LOW (ref 8.9–10.3)
Chloride: 103 mmol/L (ref 98–111)
Creatinine, Ser: 1.13 mg/dL (ref 0.61–1.24)
GFR, Estimated: >60 mL/min (ref >60)
Glucose, Bld: 88 mg/dL (ref 70–99)
Potassium: 3.8 mmol/L (ref 3.5–5.1)
Sodium: 136 mmol/L (ref 135–145)
Total Bilirubin: 0.2 mg/dL — ABNORMAL LOW (ref 0.3–1.2)
Total Protein: 7.1 g/dL (ref 6.5–8.1)

## 2022-08-22 LAB — CBC WITH DIFFERENTIAL/PLATELET
Abs Immature Granulocytes: 0.02 10*3/uL (ref 0.00–0.07)
Basophils Absolute: 0.1 10*3/uL (ref 0.0–0.1)
Basophils Relative: 1 %
Eosinophils Absolute: 0.2 10*3/uL (ref 0.0–0.5)
Eosinophils Relative: 3 %
HCT: 40.2 % (ref 39.0–52.0)
Hemoglobin: 12.9 g/dL — ABNORMAL LOW (ref 13.0–17.0)
Immature Granulocytes: 0 %
Lymphocytes Relative: 18 %
Lymphs Abs: 1.4 10*3/uL (ref 0.7–4.0)
MCH: 26.2 pg (ref 26.0–34.0)
MCHC: 32.1 g/dL (ref 30.0–36.0)
MCV: 81.7 fL (ref 80.0–100.0)
Monocytes Absolute: 0.5 10*3/uL (ref 0.1–1.0)
Monocytes Relative: 6 %
Neutro Abs: 5.6 10*3/uL (ref 1.7–7.7)
Neutrophils Relative %: 72 %
Platelets: 330 10*3/uL (ref 150–400)
RBC: 4.92 MIL/uL (ref 4.22–5.81)
RDW: 15.7 % — ABNORMAL HIGH (ref 11.5–15.5)
WBC: 7.8 10*3/uL (ref 4.0–10.5)
nRBC: 0 % (ref 0.0–0.2)

## 2022-08-22 LAB — TSH: TSH: 5.407 u[IU]/mL — ABNORMAL HIGH (ref 0.350–4.500)

## 2022-08-22 LAB — MAGNESIUM: Magnesium: 2.1 mg/dL (ref 1.7–2.4)

## 2022-08-22 MED ORDER — SODIUM CHLORIDE 0.9% FLUSH
10.0000 mL | INTRAVENOUS | Status: DC | PRN
  Administered 2022-08-22: 10 mL via INTRAVENOUS

## 2022-08-22 MED ORDER — HYDROCODONE-ACETAMINOPHEN 5-325 MG PO TABS: 1.0000 | ORAL_TABLET | Freq: Three times a day (TID) | ORAL | 0 refills | Status: DC | PRN

## 2022-08-22 MED ORDER — HEPARIN SOD (PORK) LOCK FLUSH 100 UNIT/ML IV SOLN
500.0000 [IU] | Freq: Once | INTRAVENOUS | Status: AC
  Administered 2022-08-22: 500 [IU] via INTRAVENOUS

## 2022-08-22 NOTE — Progress Notes (Signed)
Patients port flushed without difficulty.  Good blood return noted with no bruising or swelling noted at site.  Band aid applied.  VSS with discharge and left in satisfactory condition with no s/s of distress noted.   

## 2022-08-22 NOTE — Progress Notes (Signed)
[name] [address] [place] [address]    Clinic Day:  08/22/2022  Referring physician: [name], FNP  Patient Care Team: Jacob Perking, FNP as PCP - General (Family Medicine) Jacob Mates, RN as Oncology Nurse Navigator (Medical Oncology) Jacob Jack, MD as Medical Oncologist (Medical Oncology)   ASSESSMENT & PLAN:   Assessment: Metastatic kidney cancer to the bones: - CT chest lung cancer screening scan (08/31/2021): Lung RADS 1S.  Possible soft tissue fullness in the upper pole of the left kidney. - US renal (09/15/2021): Mass with solid component in the upper pole of the left kidney measuring 6.1 cm, highly suspicious for RCC. - MRI of the abdomen (09/22/2021): 6.5 cm left kidney upper pole solid enhancing renal mass favoring RCC.  Scattered enhancing bone lesions suspicious for osseous metastatic disease, largest at L3 and in the central sacrum.  1.3 x 1.4 cm mass of the left adrenal gland has indeterminate characteristics by MRI but on CT chest seems to have a low-density favoring adenoma.  No tumor thrombus in the left renal vein. - PET scan (10/07/2021): Hypermetabolic left kidney mass with SUV 7.1.  Bilateral abdominal retroperitoneal hypermetabolic lymph nodes.  Multifocal bone metastasis.  Central sacrum, inferior aspect of L3 vertebral body, T8 body, C7 spinous process and medial right clavicle. 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  Biopsy shows a poorly differentiated neoplasm, IHC positive for CK8/18 (subset), CD10 and cytokeratin AE 1/3.  Cells are negative for CK20, S100, HMB45, SOX10, calretinin, P504S, prostein, PSA, desmin and CDX2. - I have talked to our pathologist Dr. Melina Reynolds who thought it is most likely clear-cell histology although IHC does not support it. - IMDC criteria: 1 risk factor with less than 1 year from time of diagnosis to systemic  therapy.  Intermediate risk group - Opdivo and Yervoy cycle 1 started on 11/17/2021.  Discontinued on 08/01/2022 due to progression. 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  He is a potential candidate for belzutifan on further progression. - Cabozantinib 60 mg daily started on 08/03/2022 - Germline mutation testing is negative.    Social/family history: - His nephew lives with him.  He has a 30-year-old daughter who was given for adoption.  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  He is a current active smoker, 1 pack/day for close to 40 years.  He drinks 1 beer at night. - Maternal aunt had breast cancer.  Maternal cousin has prostate cancer and another maternal cousin had breast cancer.  Maternal cousin also had kidney removed, thought to be secondary to cancer.  Paternal grandfather had lung cancer.  His sister reportedly diagnosed with stage IV kidney cancer.  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 - He reported having diarrhea for the first 3 days which have subsided. - Diastolic blood pressure is slightly elevated at 97.  I have recommended that he monitor his blood pressure at home and bring the readings to [place]. - Reviewed labs today which showed normal LFTs and creatinine.  CBC was grossly normal. - Continue cabozantinib 60 mg daily.  RTC 2 weeks for follow-up with repeat labs. - Recommend taking Compazine for occasional nausea.  2.  Upper back  pain: - This has slightly worsened and the pain is in between his shoulder blades. - PET scan showed thoracic spine lesions.  I have recommended obtaining MRI of the thoracic spine with and without contrast. - He is taking Mobic which did not help. - Will start him on hydrocodone 5/325 1 tablet  every 8 hours as needed.  He was told to take with stool softener if needed.  3.  Bone metastatic disease: - We will start denosumab after dental treatments done.   4.  Stiffness and pain in the hand joints and knees: - He was requiring prednisone on and off while he was on immunotherapy.  He did not require any prednisone in the last 2 weeks since cabozantinib started.  Orders Placed This Encounter  Procedures   MR Thoracic Spine W Wo Contrast    Standing Status:   Future    Standing Expiration Date:   08/22/2023    Order Specific Question:   GRA to provide read?    Answer:   Yes    Order Specific Question:   If indicated for the ordered procedure, I authorize the administration of contrast media per Radiology protocol    Answer:   Yes    Order Specific Question:   What is the patient's sedation requirement?    Answer:   No Sedation    Order Specific Question:   Use SRS Protocol?    Answer:   No    Order Specific Question:   Does the patient have a pacemaker or implanted devices?    Answer:   No    Order Specific Question:   Preferred imaging location?    Answer:   [facility] (table limit [phone])    Order Specific Question:   Release to patient    Answer:   Immediate   CBC with Differential/Platelet    Standing Status:   Future    Standing Expiration Date:   08/22/2023    Order Specific Question:   Release to patient    Answer:   Immediate   Comprehensive metabolic panel    Standing Status:   Future    Standing Expiration Date:   08/22/2023    Order Specific Question:   Release to patient    Answer:   Immediate   Magnesium    Standing Status:   Future    Standing Expiration Date:   08/22/2023    Order Specific Question:   Release to patient    Answer:   Immediate   TSH    Standing Status:   Future    Standing Expiration Date:   08/22/2023    Order Specific Question:   Release to patient    Answer:   Immediate      I,Jacob Reynolds,acting as a scribe for  Jacob Jack, MD.,have documented all relevant documentation on the behalf of Jacob Jack, MD,as directed by  Jacob Jack, MD while in the presence of Jacob Jack, MD.  I, Jacob Jack MD, have reviewed the above documentation for accuracy and completeness, and I agree with the above.    Jacob Jack, MD   3/11/20242:27 PM  CHIEF COMPLAINT:   Diagnosis: metastatic kidney cancer    Cancer Staging  Metastatic renal cell carcinoma to bone [facility]) Staging form: Kidney, AJCC 8th Edition - Clinical stage from 11/04/2021: Stage IV (cT1b, cN1, pM1) - Unsigned    Prior Therapy:  Nivolumab + Ipilimumab q21d / Nivolumab q28d   Current Therapy: Cabozantinib  HISTORY OF PRESENT ILLNESS:   Oncology History  Metastatic renal cell carcinoma to bone ([place])  11/04/2021 Initial Diagnosis   Metastatic renal cell carcinoma to bone ([place])   11/17/2021 - 02/10/2022 Chemotherapy   Patient is on Treatment Plan : RENAL CELL CARCINOMA Nivolumab + Ipilimumab q21d / Nivolumab q28d     11/17/2021 - 07/04/2022 Chemotherapy   Patient is on Treatment Plan : RENAL CELL CARCINOMA Nivolumab (3) + Ipilimumab (1) q21d x 4 cycles  / Nivolumab (480) q28d        INTERVAL HISTORY:   Jacob Reynolds is a 57 y.o. male presenting to clinic today for follow up of metastatic kidney cancer. He was last seen by me on 08/01/22.  Today, he states that he is doing well overall. His appetite level is at 80%. His energy level is at 65%.  He reported that he started cabozantinib around 08/05/2022.  He had diarrhea for couple of days.  Denies any severe fatigue.  He reported worsening of the upper back pain in between shoulder blades.   PAST MEDICAL HISTORY:   Past Medical History: Past Medical History:  Diagnosis Date   Plantar fasciitis    Port-A-Cath in place 11/10/2021    Surgical History: Past Surgical History:  Procedure Laterality Date   IR IMAGING GUIDED PORT INSERTION  10/15/2021     Social History: Social History   Socioeconomic History   Marital status: Single    Spouse name: Not on file   Number of children: 1   Years of education: 11   Highest education level: 11th grade  Occupational History   Not on file  Tobacco Use   Smoking status: Every Day    Packs/day: 1.00    Years: 36.00    Total pack years: 36.00    Types: Cigarettes    Start date: 06/13/1978   Smokeless tobacco: Never  Vaping Use   Vaping Use: Never used  Substance and Sexual Activity   Alcohol use: Yes    Alcohol/week: 7.0 standard drinks of alcohol    Types: 7 Cans of beer per week   Drug use: Yes    Frequency: 1.0 times per week    Types: Marijuana   Sexual activity: Not on file  Other Topics Concern   Not on file  Social History Narrative   Not on file   Social Determinants of Health   Financial Resource Strain: Not on file  Food Insecurity: Not on file  Transportation Needs: Not on file  Physical Activity: Not on file  Stress: Not on file  Social Connections: Not on file  Intimate Partner Violence: Not on file    Family History: Family History  Problem Relation Age of Onset   Hypertension Mother    Parkinson's disease Father    Breast cancer Maternal Aunt        dx >50   Lung cancer Paternal Grandfather    Breast cancer Cousin        dx 81s   Prostate cancer Cousin        dx 56s    Current Medications:  Current Outpatient Medications:    albuterol (VENTOLIN HFA) 108 (90 Base) MCG/ACT inhaler, Inhale 2 puffs into the lungs every 6 (six) hours as needed for wheezing or shortness of breath., Disp: 18 g, Rfl: 1   Budeson-Glycopyrrol-Formoterol (BREZTRI AEROSPHERE) 160-9-4.8 MCG/ACT AERO, Inhale 2 puffs into the lungs 2 (two) times daily., Disp: 10.7 g, Rfl: 11   cabozantinib (CABOMETYX) 60 MG tablet, Take  1 tablet (60 mg total) by mouth daily. Take on an empty stomach, 1 hour before or 2 hours after meals., Disp: 30 tablet, Rfl: 2   HYDROcodone-acetaminophen  (NORCO) 5-325 MG tablet, Take 1 tablet by mouth every 8 (eight) hours as needed for moderate pain., Disp: 60 tablet, Rfl: 0   meloxicam (MOBIC) 15 MG tablet, Take 1 tablet (15 mg total) by mouth daily as needed for pain., Disp: 30 tablet, Rfl: 0   prochlorperazine (COMPAZINE) 10 MG tablet, Take 1 tablet (10 mg total) by mouth every 6 (six) hours as needed for nausea or vomiting., Disp: 30 tablet, Rfl: 6   rosuvastatin (CRESTOR) 5 MG tablet, Take 1 tablet (5 mg total) by mouth daily., Disp: 90 tablet, Rfl: 3   traMADol (ULTRAM) 50 MG tablet, Take 1 tablet (50 mg total) by mouth every 8 (eight) hours as needed., Disp: 30 tablet, Rfl: 0 No current facility-administered medications for this visit.  Facility-Administered Medications Ordered in Other Visits:    diphenhydrAMINE (BENADRYL) 50 MG/ML injection, , , ,    famotidine (PEPCID) 20-0.9 MG/50ML-% IVPB, , , ,    sodium chloride flush (NS) 0.9 % injection 10 mL, 10 mL, Intravenous, PRN, Jacob Jack, MD, 10 mL at 08/22/22 1313   Allergies: Allergies  Allergen Reactions   Iodine     Throat felt like closed Was in hospital for allergy and almost died- per patient     REVIEW OF SYSTEMS:   Review of Systems  Constitutional:  Negative for chills, fatigue and fever.  HENT:   Negative for lump/mass, mouth sores, nosebleeds, sore throat and trouble swallowing.   Eyes:  Negative for eye problems.  Respiratory:  Negative for cough and shortness of breath.   Cardiovascular:  Negative for chest pain, leg swelling and palpitations.  Gastrointestinal:  Positive for nausea. Negative for abdominal pain, constipation, diarrhea and vomiting.  Genitourinary:  Negative for bladder incontinence, difficulty urinating, dysuria, frequency, hematuria and nocturia.   Musculoskeletal:  Negative for arthralgias, back pain, flank pain, myalgias and neck pain.  Skin:  Negative for itching and rash.  Neurological:  Negative for dizziness, headaches and  numbness.  Hematological:  Does not bruise/bleed easily.  Psychiatric/Behavioral:  Positive for sleep disturbance. Negative for depression and suicidal ideas. The patient is not nervous/anxious.   All other systems reviewed and are negative.    VITALS:   Blood pressure (!) 145/97.  Wt Readings from Last 3 Encounters:  08/22/22 174 lb 12.8 oz (79.3 kg)  08/08/22 174 lb (78.9 kg)  08/01/22 171 lb 12.8 oz (77.9 kg)    There is no height or weight on file to calculate BMI.  Performance status (ECOG): 1 - Symptomatic but completely ambulatory  PHYSICAL EXAM:   Physical Exam Vitals and nursing note reviewed. Exam conducted with a chaperone present.  Constitutional:      Appearance: Normal appearance.  Cardiovascular:     Rate and Rhythm: Normal rate and regular rhythm.     Pulses: Normal pulses.     Heart sounds: Normal heart sounds.  Pulmonary:     Effort: Pulmonary effort is normal.     Breath sounds: Normal breath sounds.  Abdominal:     Palpations: Abdomen is soft. There is no hepatomegaly, splenomegaly or mass.     Tenderness: There is no abdominal tenderness.  Musculoskeletal:     Right lower leg: No edema.     Left lower leg: No edema.  Lymphadenopathy:     Cervical: No  cervical adenopathy.     Right cervical: No superficial, deep or posterior cervical adenopathy.    Left cervical: No superficial, deep or posterior cervical adenopathy.     Upper Body:     Right upper body: No supraclavicular or axillary adenopathy.     Left upper body: No supraclavicular or axillary adenopathy.  Neurological:     General: No focal deficit present.     Mental Status: He is alert and oriented to person, place, and time.  Psychiatric:        Mood and Affect: Mood normal.        Behavior: Behavior normal.     LABS:      Latest Ref Rng & Units 08/22/2022    1:12 PM 08/01/2022   11:28 AM 07/15/2022    1:22 PM  CBC  WBC 4.0 - 10.5 K/uL 7.8  12.1  10.4   Hemoglobin 13.0 - 17.0 g/dL  12.9  12.4  11.9   Hematocrit 39.0 - 52.0 % 40.2  38.9  37.4   Platelets 150 - 400 K/uL 330  393  319       Latest Ref Rng & Units 08/22/2022    1:12 PM 08/01/2022   11:28 AM 07/15/2022    1:22 PM  CMP  Glucose 70 - 99 mg/dL 88  133  94   BUN 6 - 20 mg/dL '18  15  20   '$ Creatinine 0.61 - 1.24 mg/dL 1.13  1.30  1.27   Sodium 135 - 145 mmol/L 136  137  137   Potassium 3.5 - 5.1 mmol/L 3.8  3.9  3.8   Chloride 98 - 111 mmol/L 103  100  101   CO2 22 - 32 mmol/L '25  26  25   '$ Calcium 8.9 - 10.3 mg/dL 8.3  8.5  8.6   Total Protein 6.5 - 8.1 g/dL 7.1  7.3  7.5   Total Bilirubin 0.3 - 1.2 mg/dL 0.2  0.6  0.4   Alkaline Phos 38 - 126 U/L 80  61  67   AST 15 - 41 U/L 34  25  19   ALT 0 - 44 U/L 33  25  21      No results found for: "CEA1", "CEA" / No results found for: "CEA1", "CEA" Lab Results  Component Value Date   PSA1 1.3 08/05/2021   No results found for: "[id number]" No results found for: "CAN125"  No results found for: "TOTALPROTELP", "ALBUMINELP", "A1GS", "A2GS", "BETS", "BETA2SER", "GAMS", "MSPIKE", "SPEI" No results found for: "TIBC", "FERRITIN", "IRONPCTSAT" Lab Results  Component Value Date   LDH 144 11/04/2021     STUDIES:   NM PET Image Restag (PS) Skull Base To Thigh  Result Date: 07/29/2022 CLINICAL DATA:  Subsequent treatment strategy for metastatic renal cell carcinoma. EXAM: NUCLEAR MEDICINE PET SKULL BASE TO THIGH TECHNIQUE: 9.02 mCi F-18 FDG was injected intravenously. Full-ring PET imaging was performed from the skull base to thigh after the radiotracer. CT data was obtained and used for attenuation correction and anatomic localization. Fasting blood glucose: 113 mg/dl COMPARISON:  PET-CT 04/28/2022 and CT from 07/15/2022. FINDINGS: Mediastinal blood pool activity: SUV max 2.40 Liver activity: SUV max NA NECK: No hypermetabolic lymph nodes in the neck. Incidental CT findings: None. CHEST: -New tracer avid left supraclavicular lymph node measures 8 mm and has an SUV max  of 11.35 -New left internal mammary lymph node measures 6 mm short axis and has an SUV max of 3.57, image  144/3. -New bilateral tracer avid hilar adenopathy, image 131/3. SUV max within the right hilum is equal to 8.10. SUV max in the left hilum is equal to 8.65. -New tracer avid subcarinal lymph node measures 1 cm and has an SUV max of 9.53, image 131/3. -index right retrocrural lymph node measures 1.5 cm and has an SUV max of 10.75, image 188/3. On the previous exam this measured 1 cm within SUV max of 4.7. No tracer avid pulmonary nodules. Incidental CT findings: Emphysema. Aortic atherosclerosis and coronary artery calcifications. ABDOMEN/PELVIS: New left adrenal mass measures 4.7 x 3.9 cm with SUV max of 12.29, image 184/3. On the previous PET-CT there was a 1 cm left adrenal nodule within SUV max of 2.7. Interval increase in size of the left pole kidney mass which measures 8.0 cm with signs of internal necrosis, image 194/3. On the previous PET-CT this mass measured 5.5 cm. Nodule within SUV max of 2.70. Significant progression of retroperitoneal adenopathy. -Nodal conglomeration at the level of the left renal vein measures 4.6 x 3.5 cm and has an SUV max of 13.92, image 203/3. On the previous exam this measured 1.5 cm with an SUV max of 7.72. -Right retrocaval nodal conglomeration measures 4.3 x 2.0 cm within SUV max of 13.92, image 207/3. On the previous exam this measured 2.1 x 1.3 cm with SUV max 5.01. -new right common iliac lymph node measures 1.2 cm within SUV max of 4.24, image 249/3 Signs of extensive peritoneal disease identified within the abdomen and pelvis. -diffuse tracer avid soft tissue infiltration throughout the omentum with SUV max of 7.42, image 221/3. -Tumor rind extends into the left upper quadrant of the abdomen and partially encases the spleen and left lobe of liver. Index tumor along the inferior margin of the spleen measures 5.7 x 3.3 cm and has an SUV max of 12.46, image 185/3. New  abdominopelvic ascites. Incidental CT findings: Aortic atherosclerosis. SKELETON: Signs of multifocal tracer avid bone metastases. -Index lesion involving the T6 vertebra has an SUV max of 12.53, image 128/3. On the corresponding CT images there are no lytic or sclerotic changes identified. -Small focus of increased uptake within the left base of skull is new from the previous exam with SUV max of 4.88, image 47/3. No corresponding lytic or sclerotic bone lesions identified on the CT images. -Tracer uptake within the left transverse process of the T5 vertebra noted, image 117/3. Incidental CT findings: None. IMPRESSION: 1. Significant interval progression of disease. 2. Interval development of extensive tracer avid peritoneal disease 3. Interval progression of tracer avid abdominopelvic adenopathy. New tracer avid bilateral hilar, mediastinal, left supraclavicular and left internal mammary lymph nodes 4. New multifocal tracer avid bone metastases. No corresponding lytic or sclerotic changes identified on the CT images. 5. Interval increase in size of tracer avid left kidney mass. 6.  Aortic Atherosclerosis (ICD10-I70.0). Electronically Signed   By: Kerby Moors M.D.   On: 07/29/2022 09:12

## 2022-08-22 NOTE — Patient Instructions (Addendum)
[facility]  Discharge Instructions  You were seen and examined today by Dr. Delton Coombes.  Dr. Delton Coombes discussed your most recent lab work which revealed that everything looks stable.  Dr. Delton Coombes is going to get an MRI of your T spine. Start taking your blood pressure at home and keep a log of them to show Dr. Delton Coombes. We will repeat labs before your next appointment.  Follow-up as scheduled in 2 weeks.    Thank you for choosing [facility] to provide your oncology and hematology care.   To afford each patient quality time with our provider, please arrive at least 15 minutes before your scheduled appointment time. You may need to reschedule your appointment if you arrive late (10 or more minutes). Arriving late affects you and other patients whose appointments are after yours.  Also, if you miss three or more appointments without notifying the office, you may be dismissed from the clinic at the provider's discretion.    Again, thank you for choosing [facility].  Our hope is that these requests will decrease the amount of time that you wait before being seen by our physicians.   If you have a lab appointment with the [facility] please come in thru the Main Entrance and check in at the main information desk.           _____________________________________________________________  Should you have questions after your visit to [facility], please contact our office at [phone] and follow the prompts.  Our office hours are 8:00 a.m. to 4:30 p.m. Monday - Thursday and 8:00 a.m. to 2:30 p.m. Friday.  Please note that voicemails left after 4:00 p.m. may not be returned until the following business day.  We are closed weekends and all major holidays.  You do have access to a nurse 24-7, just call the main number to the clinic [phone] and do not press any options, hold on the line and a nurse will  answer the phone.    For prescription refill requests, have your pharmacy contact our office and allow 72 hours.    Masks are optional in the cancer centers. If you would like for your care team to wear a mask while they are taking care of you, please let them know. You may have one support person who is at least 57 years old accompany you for your appointments.

## 2022-08-23 ENCOUNTER — Other Ambulatory Visit (HOSPITAL_COMMUNITY): Payer: Self-pay

## 2022-08-26 ENCOUNTER — Ambulatory Visit (HOSPITAL_COMMUNITY)
Admission: RE | Admit: 2022-08-26 | Discharge: 2022-08-26 | Disposition: A | Discharge status: Home / Self Care | Payer: Commercial Managed Care - PPO | Source: Ambulatory Visit | Attending: Physician Assistant | Admitting: Physician Assistant

## 2022-08-26 ENCOUNTER — Inpatient Hospital Stay: Payer: Commercial Managed Care - PPO

## 2022-08-26 ENCOUNTER — Other Ambulatory Visit: Payer: Self-pay | Admitting: Physician Assistant

## 2022-08-26 ENCOUNTER — Other Ambulatory Visit: Payer: Self-pay

## 2022-08-26 ENCOUNTER — Encounter (HOSPITAL_COMMUNITY): Payer: Self-pay

## 2022-08-26 ENCOUNTER — Inpatient Hospital Stay (HOSPITAL_BASED_OUTPATIENT_CLINIC_OR_DEPARTMENT_OTHER): Payer: Commercial Managed Care - PPO | Admitting: Physician Assistant

## 2022-08-26 VITALS — BP 134/95 | HR 65 | Resp 20

## 2022-08-26 DIAGNOSIS — C649 Malignant neoplasm of unspecified kidney, except renal pelvis: Secondary | ICD-10-CM

## 2022-08-26 DIAGNOSIS — R6884 Jaw pain: Secondary | ICD-10-CM | POA: Insufficient documentation

## 2022-08-26 DIAGNOSIS — G893 Neoplasm related pain (acute) (chronic): Secondary | ICD-10-CM | POA: Diagnosis not present

## 2022-08-26 DIAGNOSIS — L03211 Cellulitis of face: Secondary | ICD-10-CM

## 2022-08-26 DIAGNOSIS — K047 Periapical abscess without sinus: Secondary | ICD-10-CM

## 2022-08-26 DIAGNOSIS — C7951 Secondary malignant neoplasm of bone: Secondary | ICD-10-CM

## 2022-08-26 DIAGNOSIS — C642 Malignant neoplasm of left kidney, except renal pelvis: Secondary | ICD-10-CM | POA: Diagnosis not present

## 2022-08-26 LAB — CBC WITH DIFFERENTIAL/PLATELET
Abs Immature Granulocytes: 0.02 10*3/uL (ref 0.00–0.07)
Basophils Absolute: 0.1 10*3/uL (ref 0.0–0.1)
Basophils Relative: 1 %
Eosinophils Absolute: 0.2 10*3/uL (ref 0.0–0.5)
Eosinophils Relative: 3 %
HCT: 43.1 % (ref 39.0–52.0)
Hemoglobin: 13.7 g/dL (ref 13.0–17.0)
Immature Granulocytes: 0 %
Lymphocytes Relative: 14 %
Lymphs Abs: 1.2 10*3/uL (ref 0.7–4.0)
MCH: 26.1 pg (ref 26.0–34.0)
MCHC: 31.8 g/dL (ref 30.0–36.0)
MCV: 82.1 fL (ref 80.0–100.0)
Monocytes Absolute: 0.6 10*3/uL (ref 0.1–1.0)
Monocytes Relative: 7 %
Neutro Abs: 6.1 10*3/uL (ref 1.7–7.7)
Neutrophils Relative %: 75 %
Platelets: 285 10*3/uL (ref 150–400)
RBC: 5.25 MIL/uL (ref 4.22–5.81)
RDW: 16.3 % — ABNORMAL HIGH (ref 11.5–15.5)
WBC: 8.1 10*3/uL (ref 4.0–10.5)
nRBC: 0 % (ref 0.0–0.2)

## 2022-08-26 LAB — COMPREHENSIVE METABOLIC PANEL
ALT: 28 U/L (ref 0–44)
AST: 30 U/L (ref 15–41)
Albumin: 2.7 g/dL — ABNORMAL LOW (ref 3.5–5.0)
Alkaline Phosphatase: 81 U/L (ref 38–126)
Anion gap: 10 (ref 5–15)
BUN: 14 mg/dL (ref 6–20)
CO2: 25 mmol/L (ref 22–32)
Calcium: 8.5 mg/dL — ABNORMAL LOW (ref 8.9–10.3)
Chloride: 100 mmol/L (ref 98–111)
Creatinine, Ser: 1.2 mg/dL (ref 0.61–1.24)
GFR, Estimated: >60 mL/min (ref >60)
Glucose, Bld: 107 mg/dL — ABNORMAL HIGH (ref 70–99)
Potassium: 4.1 mmol/L (ref 3.5–5.1)
Sodium: 135 mmol/L (ref 135–145)
Total Bilirubin: 0.4 mg/dL (ref 0.3–1.2)
Total Protein: 7.3 g/dL (ref 6.5–8.1)

## 2022-08-26 LAB — MAGNESIUM: Magnesium: 2 mg/dL (ref 1.7–2.4)

## 2022-08-26 MED ORDER — HYDROCODONE-ACETAMINOPHEN 5-325 MG PO TABS
1.0000 | ORAL_TABLET | Freq: Once | ORAL | Status: AC
  Administered 2022-08-26: 1 via ORAL
  Filled 2022-08-26: qty 1

## 2022-08-26 MED ORDER — SODIUM CHLORIDE 0.9 % IV SOLN: INTRAVENOUS | Status: DC

## 2022-08-26 MED ORDER — METRONIDAZOLE 500 MG PO TABS: 500.0000 mg | ORAL_TABLET | Freq: Three times a day (TID) | ORAL | 0 refills | Status: AC

## 2022-08-26 MED ORDER — SODIUM CHLORIDE 0.9% FLUSH
10.0000 mL | Freq: Once | INTRAVENOUS | Status: AC
  Administered 2022-08-26: 10 mL via INTRAVENOUS

## 2022-08-26 MED ORDER — LEVOFLOXACIN 750 MG PO TABS: 750.0000 mg | ORAL_TABLET | Freq: Every day | ORAL | 0 refills | Status: DC

## 2022-08-26 MED ORDER — LOPERAMIDE HCL 2 MG PO CAPS: 2.0000 mg | ORAL_CAPSULE | ORAL | 0 refills | Status: DC | PRN

## 2022-08-26 MED ORDER — HEPARIN SOD (PORK) LOCK FLUSH 100 UNIT/ML IV SOLN
500.0000 [IU] | Freq: Once | INTRAVENOUS | Status: AC
  Administered 2022-08-26: 500 [IU] via INTRAVENOUS

## 2022-08-26 NOTE — Progress Notes (Signed)
Maxillofacial CT (noncontrast due to allergy) obtained stat after symptom management visit due to acute right-sided jaw pain showed findings consistent with periodontal disease and possible periapical abscess as well as possible developing facial cellulitis without apparent abscess.  Since patient is immunocompromised, we will start him on the following antibiotics (per Dr. Delton Coombes): - Levaquin 750 mg daily x 7 days - Flagyl 500 mg every 8 hours x 7 days  The above instructions were communicated to the patient by telephone at 5:07 PM on 08/26/2022.  Patient verbalized understanding.  We also discussed the following: - Patient can continue to take his cabozantinib as prescribed - Instructed to proceed to the emergency department if he has any fever/chills or significant worsening of his symptoms over the weekend - Patient instructed to call [facility] on Monday if his symptoms have not improved - Patient instructed to reach out to his dentist and/or endodontist ASAP  [name], PA-C  08/26/22 5:17 PM

## 2022-08-26 NOTE — Patient Instructions (Signed)
[facility] **   You were seen today by Tarri Abernethy PA-C for your symptom management visit.    DIARRHEA: Take Imodium 2 tablets after your first loose bowel movement of the day.  Take an additional tablet of Imodium after every subsequent loose bowel movement.  Do not take more than 8 tablets (16 mg total) of Imodium in 1 day.  If you continue to have severe diarrhea, please call the clinic so that we can prescribe another medicine if needed.    JAW PAIN: We are sending you for CT scan of your jaw today.  We will call you with results and further instructions once we have them.  BONE PAIN: You can take your hydrocodone 5/325 every 8 hours as prescribed for cancer-related pain.  DEHYDRATION: It is very important that you remain hydrated while you are receiving treatment!   Make sure that you are drinking at least 64 ounces of water each day.  You can drink juice, decaffeinated drinks, and sugar free beverages in addition to water, but should still drink plenty of water. Limit the amount of soda you drink. Although plain water is the best for you, you can try adding sugar-free/caffeine-free flavor packs (ex: Pedialyte) to change the flavor. Popsicles, soup, and ice cream are great ways to add even more fluids! Mix your juice and Gatorade with some water to increase your water intake. If you are unable to drink enough water, or are losing fluids through vomiting or diarrhea, please [place]. If you are severely dehydrated, you may have symptoms such as low blood pressure, increased weakness, lightheadedness with standing, passing out, or feeling that your heartbeat is racing.  If the symptoms occur, please [place].  If the symptoms are severe or occur outside of business hours, proceed to the emergency department.   NUTRITION: Try to eat small frequent meals throughout the day, but if  you are unable to eat solid food, supplement your diet with other good sources of calorie and nutrition such as Ensure Juice, soup, ice cream, yogurt, or milkshakes.  FOLLOW-UP APPOINTMENT: Follow-up as scheduled with Dr. Delton Coombes.  ** Thank you for trusting me with your healthcare!  I strive to provide all of my patients with quality care at each visit.  If you receive a survey for this visit, I would be so grateful to you for taking the time to provide feedback.  Thank you in advance!  ~ Crescencio Jozwiak                   Dr. Derek Jack   &   Tarri Abernethy, PA-C   - - - - - - - - - - - - - - - - - -    Thank you for choosing [place] at [facility] to provide your oncology and hematology care.  To afford each patient quality time with our provider, please arrive at least 15 minutes before your scheduled appointment time.   If you have a lab appointment with the [facility] please come in thru the Main Entrance and check in at the main information desk.  You need to re-schedule your appointment should you arrive 10 or more minutes late.  We strive to give you quality time with our providers, and arriving late affects you and other patients whose appointments are after yours.  Also, if you no show three or  more times for appointments you may be dismissed from the clinic at the providers discretion.     Again, thank you for choosing [facility].  Our hope is that these requests will decrease the amount of time that you wait before being seen by our physicians.       _____________________________________________________________  Should you have questions after your visit to [facility], please contact our office at [phone] and follow the prompts.  Our office hours are 8:00 a.m. and 4:30 p.m. Monday - Friday.  Please note that voicemails left after 4:00 p.m. may not be returned until the following business day.  We are closed weekends  and major holidays.  You do have access to a nurse 24-7, just call the main number to the clinic [phone] and do not press any options, hold on the line and a nurse will answer the phone.    For prescription refill requests, have your pharmacy contact our office and allow 72 hours.

## 2022-08-26 NOTE — Progress Notes (Signed)
[place] [address] [place] [address] Phone: [phone] Fax: [place] PROGRESS NOTE   Jacob Reynolds [id number] July 29, 1965 57 y.o.  Jacob Reynolds is managed by Dr. 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    Reports onset of significant jaw pain 2 days ago on Wednesday, 08/24/2022.  He has a history of right mandibular root canal several years ago, and reports that when he went to his dentist earlier this year he was told that he had some problems with the same area of his right mandibular teeth and was referred to an endodontist (whom he has not yet seen).  Since Wednesday, he has had severe tooth pain that has now spread to his right sided mandible with radiation to his right ear.  Due to tooth and jaw pain, he has been unable to eat much solid food.  He has slightly decreased appetite due to changes in food taste.  He has been able to drink liquids, on average drinking 36 ounces of water and 24 to 36 ounces of other liquids daily.  Patient reports about 24 hours of diarrhea that occurred on Wednesday this week.  He reports approximately 10 bowel movements that were "small but soft," reports that he was "on the toilet for hours."  He has not had any recurrent diarrhea for the past 48 hours.  He was prescribed hydrocodone 5 mg / 325 mg for his cancer-related pain.  He took a few doses with good relief of his pain, but stopped taking it due to fear that it was causing his diarrhea.   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 Delton Coombes,  last MD visit on 08/22/2022 - Previously treated with Armida Sans and Yervoy, discontinued on 08/01/2022 due to progression - Started on cabozantinib 60 mg daily on 08/03/2022 - PLAN: Next scheduled visit with MD is on Monday, 09/05/2022.  # Jaw pain - Palpable area of swelling and tenderness along his mid right mandible - No evident open wounds or sores on gums or buccal mucosa - PLAN: Differential diagnosis favors periodontal disease with possible cellulitis, but due to his cabozantinib he is at risk for osteonecrosis of the jaw.  We will check stat maxillofacial CT (without contrast due to iodine allergy) and call patient with further plan once this is resulted.  # Diarrhea - Mild, self-limited diarrhea as described above - PLAN: Prescription and instructions for Imodium given to patient in case he has any recurrent diarrhea  # Pain secondary to bony metastasis - PLAN: Patient offered reassurance that he can continue to take his hydrocodone 5/325 every 8 hours as needed, and that this is unlikely to be causing his diarrhea.   REVIEW OF SYSTEMS:   Review of Systems  Constitutional:  Positive for appetite change and fatigue. Negative for activity change, chills, diaphoresis, fever and unexpected weight change.  HENT:  Positive for dental problem and trouble swallowing (Unable to chew due to dental pain). Negative for mouth sores, nosebleeds and sore throat.   Respiratory:  Positive for shortness of breath. Negative for cough.   Cardiovascular:  Negative for chest pain, palpitations and leg swelling.  Gastrointestinal:  Positive for diarrhea and nausea. Negative for abdominal pain, blood in stool, constipation and vomiting.  Genitourinary:  Negative for dysuria and hematuria.  Musculoskeletal:  Positive for back pain.  Neurological:  Negative for dizziness, light-headedness, numbness and headaches.  Psychiatric/Behavioral:  Negative for dysphoric mood and sleep disturbance. The patient is not  nervous/anxious.     Past Medical History, Surgical history, Social history, and Family history were reviewed as documented elsewhere in chart, and were updated as appropriate.   OBJECTIVE:  Physical Exam:  There were no vitals taken for this visit. ECOG: 1  Physical Exam Constitutional:      Appearance: Normal appearance. He is normal weight.  HENT:     Head:      Comments: Area of palpable edema, extremely tender to palpation with some mild erythema over mid right mandible.    Mouth/Throat:     Mouth: Mucous membranes are moist.     Pharynx: Oropharynx is clear.     Comments: Poor dentition without any discrete ulcer or abscess noted on oral exam. Cardiovascular:     Heart sounds: Normal heart sounds.  Pulmonary:     Breath sounds: Normal breath sounds.  Neurological:     General: No focal deficit present.     Mental Status: Mental status is at baseline.  Psychiatric:        Behavior: Behavior normal. Behavior is cooperative.     Lab Review:     Component Value Date/Time   NA 136 08/22/2022 1312   NA 141 08/05/2021 0850   K 3.8 08/22/2022 1312   CL 103 08/22/2022 1312   CO2 25 08/22/2022 1312   GLUCOSE 88 08/22/2022 1312   BUN 18 08/22/2022 1312   BUN 23 08/05/2021 0850   CREATININE 1.13 08/22/2022 1312   CALCIUM 8.3 (L) 08/22/2022 1312   PROT 7.1 08/22/2022 1312   PROT 7.6 08/05/2021 0850   ALBUMIN 2.6 (L) 08/22/2022 1312   ALBUMIN 4.6 08/05/2021 0850   AST 34 08/22/2022 1312   ALT 33 08/22/2022 1312   ALKPHOS 80 08/22/2022 1312   BILITOT 0.2 (L) 08/22/2022 1312   BILITOT 0.4 08/05/2021 0850   GFRNONAA >60 08/22/2022 1312   GFRAA 119 09/05/2015 0955       Component Value Date/Time   WBC 7.8 08/22/2022 1312   RBC 4.92 08/22/2022 1312   HGB 12.9 (L) 08/22/2022 1312   HGB 16.0 08/05/2021 0850   HCT 40.2 08/22/2022 1312   HCT 47.5 08/05/2021 0850   PLT 330 08/22/2022 1312   PLT 233 08/05/2021 0850   MCV 81.7 08/22/2022 1312   MCV 90 08/05/2021 0850    MCH 26.2 08/22/2022 1312   MCHC 32.1 08/22/2022 1312   RDW 15.7 (H) 08/22/2022 1312   RDW 12.6 08/05/2021 0850   LYMPHSABS 1.4 08/22/2022 1312   LYMPHSABS 1.8 08/05/2021 0850   MONOABS 0.5 08/22/2022 1312   EOSABS 0.2 08/22/2022 1312   EOSABS 0.4 08/05/2021 0850   BASOSABS 0.1 08/22/2022 1312   BASOSABS 0.1 08/05/2021 0850   -------------------------------  Imaging from last 24 hours (if applicable): Radiology interpretation: NM PET Image Restag (PS) Skull Base To Thigh  Result Date: 07/29/2022 CLINICAL DATA:  Subsequent treatment strategy for metastatic renal cell carcinoma. EXAM: NUCLEAR MEDICINE PET SKULL BASE TO THIGH TECHNIQUE: 9.02 mCi F-18 FDG was injected intravenously. Full-ring PET imaging was performed from the skull base to thigh after the radiotracer. CT data was obtained and used for attenuation correction and anatomic  localization. Fasting blood glucose: 113 mg/dl COMPARISON:  PET-CT 04/28/2022 and CT from 07/15/2022. FINDINGS: Mediastinal blood pool activity: SUV max 2.40 Liver activity: SUV max NA NECK: No hypermetabolic lymph nodes in the neck. Incidental CT findings: None. CHEST: -New tracer avid left supraclavicular lymph node measures 8 mm and has an SUV max of 11.35 -New left internal mammary lymph node measures 6 mm short axis and has an SUV max of 3.57, image 144/3. -New bilateral tracer avid hilar adenopathy, image 131/3. SUV max within the right hilum is equal to 8.10. SUV max in the left hilum is equal to 8.65. -New tracer avid subcarinal lymph node measures 1 cm and has an SUV max of 9.53, image 131/3. -index right retrocrural lymph node measures 1.5 cm and has an SUV max of 10.75, image 188/3. On the previous exam this measured 1 cm within SUV max of 4.7. No tracer avid pulmonary nodules. Incidental CT findings: Emphysema. Aortic atherosclerosis and coronary artery calcifications. ABDOMEN/PELVIS: New left adrenal mass measures 4.7 x 3.9 cm with SUV max of 12.29,  image 184/3. On the previous PET-CT there was a 1 cm left adrenal nodule within SUV max of 2.7. Interval increase in size of the left pole kidney mass which measures 8.0 cm with signs of internal necrosis, image 194/3. On the previous PET-CT this mass measured 5.5 cm. Nodule within SUV max of 2.70. Significant progression of retroperitoneal adenopathy. -Nodal conglomeration at the level of the left renal vein measures 4.6 x 3.5 cm and has an SUV max of 13.92, image 203/3. On the previous exam this measured 1.5 cm with an SUV max of 7.72. -Right retrocaval nodal conglomeration measures 4.3 x 2.0 cm within SUV max of 13.92, image 207/3. On the previous exam this measured 2.1 x 1.3 cm with SUV max 5.01. -new right common iliac lymph node measures 1.2 cm within SUV max of 4.24, image 249/3 Signs of extensive peritoneal disease identified within the abdomen and pelvis. -diffuse tracer avid soft tissue infiltration throughout the omentum with SUV max of 7.42, image 221/3. -Tumor rind extends into the left upper quadrant of the abdomen and partially encases the spleen and left lobe of liver. Index tumor along the inferior margin of the spleen measures 5.7 x 3.3 cm and has an SUV max of 12.46, image 185/3. New abdominopelvic ascites. Incidental CT findings: Aortic atherosclerosis. SKELETON: Signs of multifocal tracer avid bone metastases. -Index lesion involving the T6 vertebra has an SUV max of 12.53, image 128/3. On the corresponding CT images there are no lytic or sclerotic changes identified. -Small focus of increased uptake within the left base of skull is new from the previous exam with SUV max of 4.88, image 47/3. No corresponding lytic or sclerotic bone lesions identified on the CT images. -Tracer uptake within the left transverse process of the T5 vertebra noted, image 117/3. Incidental CT findings: None. IMPRESSION: 1. Significant interval progression of disease. 2. Interval development of extensive tracer avid  peritoneal disease 3. Interval progression of tracer avid abdominopelvic adenopathy. New tracer avid bilateral hilar, mediastinal, left supraclavicular and left internal mammary lymph nodes 4. New multifocal tracer avid bone metastases. No corresponding lytic or sclerotic changes identified on the CT images. 5. Interval increase in size of tracer avid left kidney mass. 6.  Aortic Atherosclerosis (ICD10-I70.0). Electronically Signed   By: Kerby Moors M.D.   On: 07/29/2022 09:12      WRAP UP:  All questions were answered. The patient knows to call  the clinic with any problems, questions or concerns.  Medical decision making: High  Time spent on visit: I spent 45 minutes counseling the patient face to face. The total time spent in the appointment was 60 minutes and more than 50% was on counseling.  [name], PA-C  08/26/2022 10:47 PM

## 2022-08-26 NOTE — Progress Notes (Signed)
Hydration fluids given per orders. Patient tolerated it well without problems. Vitals stable and discharged home from clinic ambulatory. Follow up as scheduled.  

## 2022-08-26 NOTE — Patient Instructions (Signed)
[facility]  Discharge Instructions: Thank you for choosing [facility] to provide your oncology and hematology care.  If you have a lab appointment with the [facility], please come in thru the Main Entrance and check in at the main information desk.  Wear comfortable clothing and clothing appropriate for easy access to any Portacath or PICC line.   We strive to give you quality time with your provider. You may need to reschedule your appointment if you arrive late (15 or more minutes).  Arriving late affects you and other patients whose appointments are after yours.  Also, if you miss three or more appointments without notifying the office, you may be dismissed from the clinic at the provider's discretion.      For prescription refill requests, have your pharmacy contact our office and allow 72 hours for refills to be completed.    Today you received the following, hydration fluids.     To help prevent nausea and vomiting after your treatment, we encourage you to take your nausea medication as directed.  BELOW ARE SYMPTOMS THAT SHOULD BE REPORTED IMMEDIATELY: *FEVER GREATER THAN 100.4 F (38 C) OR HIGHER *CHILLS OR SWEATING *NAUSEA AND VOMITING THAT IS NOT CONTROLLED WITH YOUR NAUSEA MEDICATION *UNUSUAL SHORTNESS OF BREATH *UNUSUAL BRUISING OR BLEEDING *URINARY PROBLEMS (pain or burning when urinating, or frequent urination) *BOWEL PROBLEMS (unusual diarrhea, constipation, pain near the anus) TENDERNESS IN MOUTH AND THROAT WITH OR WITHOUT PRESENCE OF ULCERS (sore throat, sores in mouth, or a toothache) UNUSUAL RASH, SWELLING OR PAIN  UNUSUAL VAGINAL DISCHARGE OR ITCHING   Items with * indicate a potential emergency and should be followed up as soon as possible or go to the Emergency Department if any problems should occur.  Please show the CHEMOTHERAPY ALERT CARD or IMMUNOTHERAPY ALERT CARD at check-in to the Emergency Department and triage  nurse.  Should you have questions after your visit or need to cancel or reschedule your appointment, please contact [facility] [phone]  and follow the prompts.  Office hours are 8:00 a.m. to 4:30 p.m. Monday - Friday. Please note that voicemails left after 4:00 p.m. may not be returned until the following business day.  We are closed weekends and major holidays. You have access to a nurse at all times for urgent questions. Please call the main number to the clinic [phone] and follow the prompts.  For any non-urgent questions, you may also contact your provider using MyChart. We now offer e-Visits for anyone 72 and older to request care online for non-urgent symptoms. For details visit [email or web address].   Also download the MyChart app! Go to the app store, search "MyChart", open the app, select [facility], and log in with your MyChart username and password.

## 2022-08-29 ENCOUNTER — Ambulatory Visit: Payer: Commercial Managed Care - PPO

## 2022-08-29 ENCOUNTER — Telehealth: Payer: Self-pay

## 2022-08-29 ENCOUNTER — Ambulatory Visit: Payer: Commercial Managed Care - PPO | Admitting: Hematology

## 2022-08-29 ENCOUNTER — Other Ambulatory Visit (HOSPITAL_COMMUNITY): Payer: Self-pay

## 2022-08-29 ENCOUNTER — Other Ambulatory Visit: Payer: Commercial Managed Care - PPO

## 2022-08-29 ENCOUNTER — Other Ambulatory Visit: Payer: Self-pay

## 2022-08-29 NOTE — Telephone Encounter (Signed)
Oral Oncology Patient Advocate Encounter  Re-authorization   Received notification that prior authorization for Cabometyx is due for renewal.   PA submitted on 08/29/22 via [email or web address]  Key [id number]  Status is pending     Berdine Addison, [place] Patient [place]  [phone] (phone) [phone] (fax) 08/29/2022 8:02 AM

## 2022-08-30 ENCOUNTER — Ambulatory Visit (HOSPITAL_COMMUNITY)
Admission: RE | Admit: 2022-08-30 | Discharge: 2022-08-30 | Disposition: A | Discharge status: Home / Self Care | Payer: Commercial Managed Care - PPO | Source: Ambulatory Visit | Attending: Hematology | Admitting: Hematology

## 2022-08-30 ENCOUNTER — Other Ambulatory Visit: Payer: Self-pay

## 2022-08-30 ENCOUNTER — Other Ambulatory Visit (HOSPITAL_COMMUNITY): Payer: Self-pay

## 2022-08-30 DIAGNOSIS — C7951 Secondary malignant neoplasm of bone: Secondary | ICD-10-CM | POA: Insufficient documentation

## 2022-08-30 DIAGNOSIS — C649 Malignant neoplasm of unspecified kidney, except renal pelvis: Secondary | ICD-10-CM | POA: Insufficient documentation

## 2022-08-30 MED ORDER — HEPARIN SOD (PORK) LOCK FLUSH 100 UNIT/ML IV SOLN
250.0000 [IU] | Freq: Once | INTRAVENOUS | Status: AC
  Administered 2022-08-30: 250 [IU] via INTRAVENOUS
  Filled 2022-08-30: qty 3

## 2022-08-30 MED ORDER — GADOBUTROL 1 MMOL/ML IV SOLN
7.5000 mL | Freq: Once | INTRAVENOUS | Status: AC | PRN
  Administered 2022-08-30: 7.5 mL via INTRAVENOUS

## 2022-08-30 NOTE — Telephone Encounter (Signed)
Called to check status of Prior Authorization and informed by representative that Prior Auth had been approved last month per our original note. Patient's insurance indicated we were getting that rejection because the patient has another program attached to his insurance he must enroll in first. Insurance has tried reaching out to patient since 08/24/22. Patient will need to call insurance plan to enroll before we will receive a paid claim.    Berdine Addison, [facility] Patient [place]  [phone] (phone) [phone] (fax) 08/30/2022 8:16 AM

## 2022-08-30 NOTE — Telephone Encounter (Signed)
Called patient to inform them of next steps. No answer and no VM on patient's number.   2nd attempt - 08/31/22   Jacob Reynolds, [facility] Patient [place]  [phone] (phone) [phone] (fax) 08/30/2022 8:35 AM

## 2022-08-31 ENCOUNTER — Other Ambulatory Visit: Payer: Self-pay

## 2022-08-31 ENCOUNTER — Other Ambulatory Visit (HOSPITAL_COMMUNITY): Payer: Self-pay

## 2022-08-31 NOTE — Telephone Encounter (Signed)
Patient returned my call and is contacting their insurance to enroll in that separate program required per his insurance.    Berdine Addison, [facility] Patient [place]  [phone] (phone) [phone] (fax) 08/31/2022 12:27 PM

## 2022-09-01 ENCOUNTER — Other Ambulatory Visit (HOSPITAL_COMMUNITY): Payer: Self-pay

## 2022-09-01 NOTE — Telephone Encounter (Signed)
Followed up with [employer], who received a successful paid claim, and they have shipped patient's Cabometyx.    Berdine Addison, [facility] Patient [place]  [phone] (phone) [phone] (fax) 09/01/2022 9:27 AM

## 2022-09-05 ENCOUNTER — Inpatient Hospital Stay: Payer: Commercial Managed Care - PPO

## 2022-09-05 ENCOUNTER — Encounter: Payer: Self-pay | Admitting: Hematology

## 2022-09-05 ENCOUNTER — Inpatient Hospital Stay (HOSPITAL_BASED_OUTPATIENT_CLINIC_OR_DEPARTMENT_OTHER): Payer: Commercial Managed Care - PPO | Admitting: Hematology

## 2022-09-05 VITALS — BP 137/106 | HR 82 | Temp 97.2°F | Resp 20 | Wt 171.6 lb

## 2022-09-05 DIAGNOSIS — C7951 Secondary malignant neoplasm of bone: Secondary | ICD-10-CM

## 2022-09-05 DIAGNOSIS — I10 Essential (primary) hypertension: Secondary | ICD-10-CM

## 2022-09-05 DIAGNOSIS — R110 Nausea: Secondary | ICD-10-CM

## 2022-09-05 DIAGNOSIS — Z8042 Family history of malignant neoplasm of prostate: Secondary | ICD-10-CM

## 2022-09-05 DIAGNOSIS — Z803 Family history of malignant neoplasm of breast: Secondary | ICD-10-CM

## 2022-09-05 DIAGNOSIS — Z801 Family history of malignant neoplasm of trachea, bronchus and lung: Secondary | ICD-10-CM

## 2022-09-05 DIAGNOSIS — M549 Dorsalgia, unspecified: Secondary | ICD-10-CM | POA: Diagnosis not present

## 2022-09-05 DIAGNOSIS — C649 Malignant neoplasm of unspecified kidney, except renal pelvis: Secondary | ICD-10-CM

## 2022-09-05 DIAGNOSIS — C642 Malignant neoplasm of left kidney, except renal pelvis: Secondary | ICD-10-CM | POA: Diagnosis not present

## 2022-09-05 DIAGNOSIS — Z95828 Presence of other vascular implants and grafts: Secondary | ICD-10-CM

## 2022-09-05 DIAGNOSIS — F17210 Nicotine dependence, cigarettes, uncomplicated: Secondary | ICD-10-CM

## 2022-09-05 LAB — COMPREHENSIVE METABOLIC PANEL
ALT: 34 U/L (ref 0–44)
AST: 40 U/L (ref 15–41)
Albumin: 2.7 g/dL — ABNORMAL LOW (ref 3.5–5.0)
Alkaline Phosphatase: 83 U/L (ref 38–126)
Anion gap: 8 (ref 5–15)
BUN: 18 mg/dL (ref 6–20)
CO2: 24 mmol/L (ref 22–32)
Calcium: 8.3 mg/dL — ABNORMAL LOW (ref 8.9–10.3)
Chloride: 103 mmol/L (ref 98–111)
Creatinine, Ser: 1.11 mg/dL (ref 0.61–1.24)
GFR, Estimated: >60 mL/min (ref >60)
Glucose, Bld: 86 mg/dL (ref 70–99)
Potassium: 4.1 mmol/L (ref 3.5–5.1)
Sodium: 135 mmol/L (ref 135–145)
Total Bilirubin: 0.4 mg/dL (ref 0.3–1.2)
Total Protein: 6.9 g/dL (ref 6.5–8.1)

## 2022-09-05 LAB — CBC WITH DIFFERENTIAL/PLATELET
Abs Immature Granulocytes: 0.02 10*3/uL (ref 0.00–0.07)
Basophils Absolute: 0.1 10*3/uL (ref 0.0–0.1)
Basophils Relative: 1 %
Eosinophils Absolute: 0.2 10*3/uL (ref 0.0–0.5)
Eosinophils Relative: 3 %
HCT: 45.5 % (ref 39.0–52.0)
Hemoglobin: 14.7 g/dL (ref 13.0–17.0)
Immature Granulocytes: 0 %
Lymphocytes Relative: 19 %
Lymphs Abs: 1.3 10*3/uL (ref 0.7–4.0)
MCH: 26.4 pg (ref 26.0–34.0)
MCHC: 32.3 g/dL (ref 30.0–36.0)
MCV: 81.8 fL (ref 80.0–100.0)
Monocytes Absolute: 0.6 10*3/uL (ref 0.1–1.0)
Monocytes Relative: 9 %
Neutro Abs: 4.6 10*3/uL (ref 1.7–7.7)
Neutrophils Relative %: 68 %
Platelets: 226 10*3/uL (ref 150–400)
RBC: 5.56 MIL/uL (ref 4.22–5.81)
RDW: 19.7 % — ABNORMAL HIGH (ref 11.5–15.5)
WBC: 6.8 10*3/uL (ref 4.0–10.5)
nRBC: 0 % (ref 0.0–0.2)

## 2022-09-05 LAB — MAGNESIUM: Magnesium: 2.1 mg/dL (ref 1.7–2.4)

## 2022-09-05 LAB — TSH: TSH: 7.5 u[IU]/mL — ABNORMAL HIGH (ref 0.350–4.500)

## 2022-09-05 MED ORDER — SODIUM CHLORIDE 0.9% FLUSH
10.0000 mL | Freq: Once | INTRAVENOUS | Status: AC
  Administered 2022-09-05: 10 mL via INTRAVENOUS

## 2022-09-05 MED ORDER — HYDROCODONE-ACETAMINOPHEN 10-325 MG PO TABS: 1.0000 | ORAL_TABLET | Freq: Two times a day (BID) | ORAL | 0 refills | Status: DC | PRN

## 2022-09-05 MED ORDER — HEPARIN SOD (PORK) LOCK FLUSH 100 UNIT/ML IV SOLN
500.0000 [IU] | Freq: Once | INTRAVENOUS | Status: AC
  Administered 2022-09-05: 500 [IU] via INTRAVENOUS

## 2022-09-05 MED ORDER — AMLODIPINE BESYLATE 5 MG PO TABS: 5.0000 mg | ORAL_TABLET | Freq: Every day | ORAL | 1 refills | Status: DC

## 2022-09-05 NOTE — Patient Instructions (Addendum)
[facility]  Discharge Instructions  You were seen and examined today by Dr. Delton Coombes.  Dr. Delton Coombes discussed your most recent lab work which revealed that everything looks okay.  Start taking the compazine daily to help with the nausea.  Dr. Delton Coombes is sending referral to radiation for your back pain.  Start taking Norvasc 5 mg once daily for your blood pressure.   Dr. Vickey Huger increased the Hydrocodone. He sent this into [facility] for you to pick it up.  Hold the Crestor until Dr. Delton Coombes tells you to start it.  Follow-up as scheduled in 2 weeks.    Thank you for choosing [facility] to provide your oncology and hematology care.   To afford each patient quality time with our provider, please arrive at least 15 minutes before your scheduled appointment time. You may need to reschedule your appointment if you arrive late (10 or more minutes). Arriving late affects you and other patients whose appointments are after yours.  Also, if you miss three or more appointments without notifying the office, you may be dismissed from the clinic at the provider's discretion.    Again, thank you for choosing [facility].  Our hope is that these requests will decrease the amount of time that you wait before being seen by our physicians.   If you have a lab appointment with the [facility] please come in thru the Main Entrance and check in at the main information desk.           _____________________________________________________________  Should you have questions after your visit to [facility], please contact our office at [phone] and follow the prompts.  Our office hours are 8:00 a.m. to 4:30 p.m. Monday - Thursday and 8:00 a.m. to 2:30 p.m. Friday.  Please note that voicemails left after 4:00 p.m. may not be returned until the following business day.  We are closed weekends and all major  holidays.  You do have access to a nurse 24-7, just call the main number to the clinic [phone] and do not press any options, hold on the line and a nurse will answer the phone.    For prescription refill requests, have your pharmacy contact our office and allow 72 hours.    Masks are optional in the cancer centers. If you would like for your care team to wear a mask while they are taking care of you, please let them know. You may have one support person who is at least 57 years old accompany you for your appointments.

## 2022-09-05 NOTE — Progress Notes (Signed)
[place] [address] [place] [address]    Clinic Day:  09/05/2022  Referring physician: [name], FNP  Patient Care Team: [name], FNP as PCP - General (Family Medicine) Brien Mates, RN as Oncology Nurse Navigator (Medical Oncology) Derek Jack, MD as Medical Oncologist (Medical Oncology)   ASSESSMENT & PLAN:   Assessment: Metastatic kidney cancer to the bones: - CT chest lung cancer screening scan (08/31/2021): Lung RADS 1S.  Possible soft tissue fullness in the upper pole of the left kidney. - US renal (09/15/2021): Mass with solid component in the upper pole of the left kidney measuring 6.1 cm, highly suspicious for RCC. - MRI of the abdomen (09/22/2021): 6.5 cm left kidney upper pole solid enhancing renal mass favoring RCC.  Scattered enhancing bone lesions suspicious for osseous metastatic disease, largest at L3 and in the central sacrum.  1.3 x 1.4 cm mass of the left adrenal gland has indeterminate characteristics by MRI but on CT chest seems to have a low-density favoring adenoma.  No tumor thrombus in the left renal vein. - PET scan (10/07/2021): Hypermetabolic left kidney mass with SUV 7.1.  Bilateral abdominal retroperitoneal hypermetabolic lymph nodes.  Multifocal bone metastasis.  Central sacrum, inferior aspect of L3 vertebral body, T8 body, C7 spinous process and medial right clavicle. 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  Biopsy shows a poorly differentiated neoplasm, IHC positive for CK8/18 (subset), CD10 and cytokeratin AE 1/3.  Cells are negative for CK20, S100, HMB45, SOX10, calretinin, P504S, prostein, PSA, desmin and CDX2. - I have talked to our pathologist Dr. Melina Copa who thought it is most likely clear-cell histology although IHC does not support it. - IMDC criteria: 1 risk factor with less than 1 year from time of diagnosis to systemic  therapy.  Intermediate risk group - Opdivo and Yervoy cycle 1 started on 11/17/2021.  Discontinued on 08/01/2022 due to progression. 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  He is a potential candidate for belzutifan on further progression. - Cabozantinib 60 mg daily started on 08/03/2022 - Germline mutation testing is negative.    Social/family history: - His nephew lives with him.  He has a 45-year-old daughter who was given for adoption.  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  He is a current active smoker, 1 pack/day for close to 40 years.  He drinks 1 beer at night. - Maternal aunt had breast cancer.  Maternal cousin has prostate cancer and another maternal cousin had breast cancer.  Maternal cousin also had kidney removed, thought to be secondary to cancer.  Paternal grandfather had lung cancer.  His sister reportedly diagnosed with stage IV kidney cancer.  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 - He is having multiple bowel movements but not watery diarrhea.  Also reported dry mouth. - Labs today shows normal LFTs, creatinine and CBC. - Continue cabozantinib 60 mg daily. - RTC 2 weeks for follow-up with repeat labs.  2.  Upper back pain: - He has pain in between his shoulder blades.  He misplaced hydrocodone 5/325 and he has been taking ibuprofen. - He reports occasional pain which is severe and not controlled with pain medication. - I  have reviewed MRI of the thoracic spine from 08/30/2022.  No epidural tumor.  T6 vertebral body lesion. - Recommend radiation oncology consultation for palliative radiation for pain control. - I will increase hydrocodone to 10/325 every 8-12 hours as needed.  3.  Bone metastatic disease: - We will start denosumab  after dental treatments done.   4.  Intermittent nausea: - Recommend taking Compazine daily in the morning as a standing dose and then take it every 6 hours as needed. - He drinks 2 ensures per day and eats 2 sandwiches per day.  5.  Hypertension: - Cabozantinib induced.  Will start him on Norvasc 5 mg daily.  Orders Placed This Encounter  Procedures   CBC with Differential/Platelet    Standing Status:   Future    Standing Expiration Date:   09/05/2023    Order Specific Question:   Release to patient    Answer:   Immediate   Comprehensive metabolic panel    Standing Status:   Future    Standing Expiration Date:   09/05/2023    Order Specific Question:   Release to patient    Answer:   Immediate   Magnesium    Standing Status:   Future    Standing Expiration Date:   09/05/2023    Order Specific Question:   Release to patient    Answer:   Immediate   Ambulatory referral to Social Work    Referral Priority:   Routine    Referral Type:   Consultation    Referral Reason:   Specialty Services Required    Referred to Provider:   Derek Jack, MD    Number of Visits Requested:   1     I,[name],acting as a scribe for Derek Jack, MD.,have documented all relevant documentation on the behalf of Derek Jack, MD,as directed by  Derek Jack, MD while in the presence of Derek Jack, MD.  I, Derek Jack MD, have reviewed the above documentation for accuracy and completeness, and I agree with the above.    Derek Jack, MD   3/25/20245:55 PM  CHIEF COMPLAINT:   Diagnosis: metastatic kidney cancer    Cancer Staging  Metastatic renal cell carcinoma to bone [facility]) Staging form: Kidney, AJCC 8th Edition - Clinical stage from 11/04/2021: Stage IV (cT1b, cN1, pM1) - Unsigned    Prior Therapy:  Nivolumab + Ipilimumab q21d / Nivolumab q28d   Current Therapy: Cabozantinib  HISTORY OF PRESENT ILLNESS:   Oncology History   Metastatic renal cell carcinoma to bone ([place])  11/04/2021 Initial Diagnosis   Metastatic renal cell carcinoma to bone ([place])   11/17/2021 - 02/10/2022 Chemotherapy   Patient is on Treatment Plan : RENAL CELL CARCINOMA Nivolumab + Ipilimumab q21d / Nivolumab q28d     11/17/2021 - 07/04/2022 Chemotherapy   Patient is on Treatment Plan : RENAL CELL CARCINOMA Nivolumab (3) + Ipilimumab (1) q21d x 4 cycles  / Nivolumab (480) q28d        INTERVAL HISTORY:   Devereaux is a 57 y.o. male presenting to clinic today for follow up of metastatic kidney cancer. He was last seen by me on 08/22/22. He was last seen by PA Pennington on 08/26/22.  Today, he states that his back pain has really been bothering him and often keeps him asleep. His pain is most severe when he wakes up in the mornings. His pain is primarily present in his mid back between his shoulders. However, he sometimes has pain in his  sacrum area and neck. He states that Hydrocodone provides minimal pain relief.  His appetite level is at 50%. His energy level is at 25%.  He states that his primary side effect of Cabozantinib is nausea. He reports that he still has intermittent nausea which makes it hard for him to eat at times. He reports x1 episode of vomiting recently but attributes this to antibiotic use. He takes Compazine with some relief. Drinking soda and burping relieves his nausea at times. He is unsure if he has lost any weight but feels that he may have due to his poor appetite. He is typically drinking 2 Ensure drinks a day and may eat a sandwich as well.  He states that his mouth is very dry in the mornings and this also began after starting the medication. He reports that he has had dry skin on his hands and face.  He reports intermittent headaches. He notes having frequent but formed stools. Patient states that he sometimes has difficulty clearing his throat.   He is scheduled to see a dentist in x3 days for evaluation and  treatment of his dental infection.  He does not check his BP at home nor is he on any BP medications.  PAST MEDICAL HISTORY:   Past Medical History: Past Medical History:  Diagnosis Date   Plantar fasciitis    Port-A-Cath in place 11/10/2021    Surgical History: Past Surgical History:  Procedure Laterality Date   IR IMAGING GUIDED PORT INSERTION  10/15/2021    Social History: Social History   Socioeconomic History   Marital status: Single    Spouse name: Not on file   Number of children: 1   Years of education: 11   Highest education level: 11th grade  Occupational History   Not on file  Tobacco Use   Smoking status: Every Day    Packs/day: 1.00    Years: 36.00    Additional pack years: 0.00    Total pack years: 36.00    Types: Cigarettes    Start date: 06/13/1978   Smokeless tobacco: Never  Vaping Use   Vaping Use: Never used  Substance and Sexual Activity   Alcohol use: Yes    Alcohol/week: 7.0 standard drinks of alcohol    Types: 7 Cans of beer per week   Drug use: Yes    Frequency: 1.0 times per week    Types: Marijuana   Sexual activity: Not on file  Other Topics Concern   Not on file  Social History Narrative   Not on file   Social Determinants of Health   Financial Resource Strain: Not on file  Food Insecurity: Not on file  Transportation Needs: Not on file  Physical Activity: Not on file  Stress: Not on file  Social Connections: Not on file  Intimate Partner Violence: Not on file    Family History: Family History  Problem Relation Age of Onset   Hypertension Mother    Parkinson's disease Father    Breast cancer Maternal Aunt        dx >50   Lung cancer Paternal Grandfather    Breast cancer Cousin        dx 44s   Prostate cancer Cousin        dx 72s    Current Medications:  Current Outpatient Medications:    albuterol (VENTOLIN HFA) 108 (90 Base) MCG/ACT inhaler, Inhale 2 puffs into the lungs every 6 (six) hours as needed for  wheezing or shortness  of breath., Disp: 18 g, Rfl: 1   amLODipine (NORVASC) 5 MG tablet, Take 1 tablet (5 mg total) by mouth daily., Disp: 30 tablet, Rfl: 1   Budeson-Glycopyrrol-Formoterol (BREZTRI AEROSPHERE) 160-9-4.8 MCG/ACT AERO, Inhale 2 puffs into the lungs 2 (two) times daily., Disp: 10.7 g, Rfl: 11   cabozantinib (CABOMETYX) 60 MG tablet, Take 1 tablet (60 mg total) by mouth daily. Take on an empty stomach, 1 hour before or 2 hours after meals., Disp: 30 tablet, Rfl: 2   HYDROcodone-acetaminophen (NORCO) 5-325 MG tablet, Take 1 tablet by mouth every 8 (eight) hours as needed for moderate pain., Disp: 60 tablet, Rfl: 0   levofloxacin (LEVAQUIN) 750 MG tablet, Take 1 tablet (750 mg total) by mouth daily., Disp: 7 tablet, Rfl: 0   loperamide (IMODIUM) 2 MG capsule, Take 1 capsule (2 mg total) by mouth as needed for diarrhea or loose stools., Disp: 30 capsule, Rfl: 0   meloxicam (MOBIC) 15 MG tablet, Take 1 tablet (15 mg total) by mouth daily as needed for pain., Disp: 30 tablet, Rfl: 0   rosuvastatin (CRESTOR) 5 MG tablet, Take 1 tablet (5 mg total) by mouth daily., Disp: 90 tablet, Rfl: 3   traMADol (ULTRAM) 50 MG tablet, Take 1 tablet (50 mg total) by mouth every 8 (eight) hours as needed., Disp: 30 tablet, Rfl: 0   prochlorperazine (COMPAZINE) 10 MG tablet, Take 1 tablet (10 mg total) by mouth every 6 (six) hours as needed for nausea or vomiting. (Patient not taking: Reported on 08/26/2022), Disp: 30 tablet, Rfl: 6 No current facility-administered medications for this visit.  Facility-Administered Medications Ordered in Other Visits:    diphenhydrAMINE (BENADRYL) 50 MG/ML injection, , , ,    famotidine (PEPCID) 20-0.9 MG/50ML-% IVPB, , , ,    Allergies: Allergies  Allergen Reactions   Iodine     Throat felt like closed Was in hospital for allergy and almost died- per patient     REVIEW OF SYSTEMS:   Review of Systems  Constitutional:  Positive for appetite change and fatigue.  Negative for chills and fever.  HENT:   Negative for lump/mass, mouth sores, nosebleeds, sore throat and trouble swallowing.   Eyes:  Negative for eye problems.  Respiratory:  Positive for cough and shortness of breath.   Cardiovascular:  Negative for chest pain, leg swelling and palpitations.  Gastrointestinal:  Positive for diarrhea and nausea. Negative for abdominal pain, constipation and vomiting.  Genitourinary:  Negative for bladder incontinence, difficulty urinating, dysuria, frequency, hematuria and nocturia.   Musculoskeletal:  Positive for back pain (6/10 in severity). Negative for arthralgias, flank pain, myalgias and neck pain.  Skin:  Negative for itching and rash.  Neurological:  Positive for headaches. Negative for dizziness and numbness.  Hematological:  Does not bruise/bleed easily.  Psychiatric/Behavioral:  Positive for sleep disturbance. Negative for depression and suicidal ideas. The patient is not nervous/anxious.   All other systems reviewed and are negative.    VITALS:   Blood pressure (!) 137/106, pulse 82, temperature (!) 97.2 F (36.2 C), temperature source Oral, resp. rate 20, weight 171 lb 9.6 oz (77.8 kg), SpO2 96 %.  Wt Readings from Last 3 Encounters:  09/05/22 171 lb 9.6 oz (77.8 kg)  09/05/22 171 lb 9.6 oz (77.8 kg)  08/26/22 173 lb 6.4 oz (78.7 kg)    Body mass index is 23.93 kg/m.  Performance status (ECOG): 1 - Symptomatic but completely ambulatory  PHYSICAL EXAM:   Physical Exam Vitals and nursing  note reviewed. Exam conducted with a chaperone present.  Constitutional:      Appearance: Normal appearance.  Cardiovascular:     Rate and Rhythm: Normal rate and regular rhythm.     Pulses: Normal pulses.     Heart sounds: Normal heart sounds.  Pulmonary:     Effort: Pulmonary effort is normal.     Breath sounds: Normal breath sounds.  Abdominal:     Palpations: Abdomen is soft. There is no hepatomegaly, splenomegaly or mass.     Tenderness:  There is no abdominal tenderness.  Musculoskeletal:     Right lower leg: No edema.     Left lower leg: No edema.  Lymphadenopathy:     Cervical: No cervical adenopathy.     Right cervical: No superficial, deep or posterior cervical adenopathy.    Left cervical: No superficial, deep or posterior cervical adenopathy.     Upper Body:     Right upper body: No supraclavicular or axillary adenopathy.     Left upper body: No supraclavicular or axillary adenopathy.  Neurological:     General: No focal deficit present.     Mental Status: He is alert and oriented to person, place, and time.  Psychiatric:        Mood and Affect: Mood normal.        Behavior: Behavior normal.     LABS:      Latest Ref Rng & Units 09/05/2022    2:22 PM 08/26/2022    9:10 AM 08/22/2022    1:12 PM  CBC  WBC 4.0 - 10.5 K/uL 6.8  8.1  7.8   Hemoglobin 13.0 - 17.0 g/dL 14.7  13.7  12.9   Hematocrit 39.0 - 52.0 % 45.5  43.1  40.2   Platelets 150 - 400 K/uL 226  285  330       Latest Ref Rng & Units 09/05/2022    2:22 PM 08/26/2022    9:10 AM 08/22/2022    1:12 PM  CMP  Glucose 70 - 99 mg/dL 86  107  88   BUN 6 - 20 mg/dL 18  14  18    Creatinine 0.61 - 1.24 mg/dL 1.11  1.20  1.13   Sodium 135 - 145 mmol/L 135  135  136   Potassium 3.5 - 5.1 mmol/L 4.1  4.1  3.8   Chloride 98 - 111 mmol/L 103  100  103   CO2 22 - 32 mmol/L 24  25  25    Calcium 8.9 - 10.3 mg/dL 8.3  8.5  8.3   Total Protein 6.5 - 8.1 g/dL 6.9  7.3  7.1   Total Bilirubin 0.3 - 1.2 mg/dL 0.4  0.4  0.2   Alkaline Phos 38 - 126 U/L 83  81  80   AST 15 - 41 U/L 40  30  34   ALT 0 - 44 U/L 34  28  33      No results found for: "CEA1", "CEA" / No results found for: "CEA1", "CEA" Lab Results  Component Value Date   PSA1 1.3 08/05/2021   No results found for: "[id number]" No results found for: "CAN125"  No results found for: "TOTALPROTELP", "ALBUMINELP", "A1GS", "A2GS", "BETS", "BETA2SER", "GAMS", "MSPIKE", "SPEI" No results found for: "TIBC",  "FERRITIN", "IRONPCTSAT" Lab Results  Component Value Date   LDH 144 11/04/2021     STUDIES:   MR Thoracic Spine W Wo Contrast  Result Date: 09/02/2022 CLINICAL DATA:  Metastatic renal cell  carcinoma. EXAM: MRI THORACIC WITHOUT AND WITH CONTRAST TECHNIQUE: Multiplanar and multiecho pulse sequences of the thoracic spine were obtained without and with intravenous contrast. CONTRAST:  7.39mL GADAVIST GADOBUTROL 1 MMOL/ML IV SOLN COMPARISON:  PET-CT 07/28/2022 and chest CT 07/15/2022. FINDINGS: Alignment:  Physiologic. Vertebrae: Widespread heterogeneity of the bone marrow throughout the visualized cervical and thoracic spine. At the site of the previously demonstrated hypermetabolic metastasis within the T6 vertebral body on PET-CT there is decreased T1 signal and heterogeneous enhancement suspicious for residual viable disease. It is difficult to be definitive about the presence or absence of active disease within the additional vertebral bodies given the underlying marrow heterogeneity. There are rounded foci of decreased T1 signal in the T10 and T12 vertebral bodies, without definite abnormal enhancement. No evidence of pathologic fracture or epidural tumor. Cord: The thoracic cord appears normal in signal and caliber.No evidence of abnormal intradural enhancement. Paraspinal and other soft tissues: No significant paraspinal findings. New moderate size left pleural effusion with mild dependent atelectasis at both lung bases. Disc levels: Sagittal localizing images of the cervical spine demonstrate multilevel spondylosis, greatest at C5-6 and C6-7. In the thoracic spine, there is only mild spondylosis and no evidence of significant disc herniation, spinal stenosis or nerve root encroachment. IMPRESSION: 1. Widespread marrow heterogeneity throughout the visualized cervical and thoracic spine limits assessment for metastatic disease. Signal changes and enhancement at the site of the previously demonstrated  hypermetabolic lesion within the T6 vertebral body on PET-CT suspicious for residual viable disease. It is difficult to be definitive about the presence or absence of active disease within the additional vertebral bodies. PET-CT follow-up suggested. 2. No evidence of pathologic fracture or epidural tumor. 3. New moderate size left pleural effusion with mild dependent atelectasis at both lung bases. 4. Mild thoracic spondylosis without significant disc herniation, spinal stenosis or nerve root encroachment. Electronically Signed   By: Richardean Sale M.D.   On: 09/02/2022 16:14   CT MAXILLOFACIAL WO CONTRAST  Result Date: 08/26/2022 CLINICAL DATA:  Provided history: Jaw pain. Right mandibular pain, dental infection versus osteonecrosis of the jaw. History of renal cell carcinoma metastatic to bone, currently on chemotherapy. EXAM: CT MAXILLOFACIAL WITHOUT CONTRAST TECHNIQUE: Multidetector CT imaging of the maxillofacial structures was performed. Multiplanar CT image reconstructions were also generated. RADIATION DOSE REDUCTION: This exam was performed according to the departmental dose-optimization program which includes automated exposure control, adjustment of the mA and/or kV according to patient size and/or use of iterative reconstruction technique. COMPARISON:  PET-CT 07/28/2022. Report from head CT 11/09/2019 (images unavailable). FINDINGS: Osseous: Periapical lucency surrounding the right mandibular second premolar tooth consistent with periodontal disease (series 9, image 43). The left mandibular first molar tooth is carious. No acute or aggressive osseous lesion is identified elsewhere. Orbits: No orbital mass or acute orbital finding. Sinuses: Mucosal thickening and fluid within the inferior frontal sinuses, bilaterally. Mucosal thickening and fluid within bilateral ethmoid air cells, overall moderate to severe. Mild mucosal thickening within the bilateral sphenoid sinuses. Trace mucosal thickening  within the right maxillary sinus. Mild mucosal thickening within the left maxillary sinus. Soft tissues: Right perimandibular soft tissue swelling and edema (for instance as seen on series 6, image 40). No soft tissue abscess is identified on this noncontrast examination. Limited intracranial: No evidence of an acute intracranial abnormality within the field of view. Impressions #1 and #2 will be called to the ordering clinician or representative by the Radiologist Assistant, and communication documented in the PACS or [employer].  IMPRESSION: 1. Right perimandibular soft tissue swelling and edema, nonspecific but possibly reflecting facial cellulitis. No soft tissue abscess is identified on this non-contrast examination. 2. Periapical lucency surrounding the adjacent right mandibular second premolar tooth consistent with periodontal disease, and possible periapical abscess. 3. The left mandibular first molar tooth is carious. 4. No acute or aggressive osseous lesion identified elsewhere. 5. Paranasal sinus disease, as described. Electronically Signed   By: Kellie Simmering D.O.   On: 08/26/2022 15:08

## 2022-09-05 NOTE — Progress Notes (Signed)
Port flushed with good blood return noted. No bruising or swelling at site. Bandaid applied and patient discharged in satisfactory condition. VVS stable with no signs or symptoms of distressed noted. 

## 2022-09-15 ENCOUNTER — Inpatient Hospital Stay: Payer: 59 | Attending: Hematology | Admitting: Licensed Clinical Social Worker

## 2022-09-15 ENCOUNTER — Other Ambulatory Visit (HOSPITAL_COMMUNITY): Payer: Self-pay

## 2022-09-15 ENCOUNTER — Telehealth: Payer: Self-pay

## 2022-09-15 DIAGNOSIS — Z803 Family history of malignant neoplasm of breast: Secondary | ICD-10-CM | POA: Insufficient documentation

## 2022-09-15 DIAGNOSIS — E876 Hypokalemia: Secondary | ICD-10-CM | POA: Insufficient documentation

## 2022-09-15 DIAGNOSIS — Z8051 Family history of malignant neoplasm of kidney: Secondary | ICD-10-CM | POA: Insufficient documentation

## 2022-09-15 DIAGNOSIS — C7951 Secondary malignant neoplasm of bone: Secondary | ICD-10-CM | POA: Insufficient documentation

## 2022-09-15 DIAGNOSIS — M546 Pain in thoracic spine: Secondary | ICD-10-CM | POA: Insufficient documentation

## 2022-09-15 DIAGNOSIS — F17210 Nicotine dependence, cigarettes, uncomplicated: Secondary | ICD-10-CM | POA: Insufficient documentation

## 2022-09-15 DIAGNOSIS — I10 Essential (primary) hypertension: Secondary | ICD-10-CM | POA: Insufficient documentation

## 2022-09-15 DIAGNOSIS — Z8042 Family history of malignant neoplasm of prostate: Secondary | ICD-10-CM | POA: Insufficient documentation

## 2022-09-15 DIAGNOSIS — C642 Malignant neoplasm of left kidney, except renal pelvis: Secondary | ICD-10-CM | POA: Insufficient documentation

## 2022-09-15 DIAGNOSIS — Z923 Personal history of irradiation: Secondary | ICD-10-CM | POA: Insufficient documentation

## 2022-09-15 DIAGNOSIS — C649 Malignant neoplasm of unspecified kidney, except renal pelvis: Secondary | ICD-10-CM

## 2022-09-15 DIAGNOSIS — Z801 Family history of malignant neoplasm of trachea, bronchus and lung: Secondary | ICD-10-CM | POA: Insufficient documentation

## 2022-09-15 NOTE — Telephone Encounter (Signed)
Oral Oncology Patient Advocate Encounter  Prior Authorization for Cabometyx has been approved.    PA# [id number]  Effective dates: 09/15/22 through 09/15/23  Patient must use [facility] per insurance.    Berdine Addison, [facility] [place]  [phone] (phone) [phone] (fax) 09/15/2022 4:08 PM

## 2022-09-15 NOTE — Progress Notes (Signed)
[place] CSW Progress Note  Holiday representative  received a referral to contact pt regarding transportation concerns.  Pt states he was having trouble finding someone to get him to radiation, but was able to find a friend to take him and has only 1 treatment left for which he has a ride.  Pt states he is concerned about finances however, as he was approved for SSDI ($1,000/month) and needed to change his health insurance.  Pt did not qualify for Medicaid and ended up w/ a market place plan through [employer] which was to go into affect at the beginning of April.  According to pt he was told at the radiation center that his [facility] is not accepted there; however, so far they have been able to get authorization for radiation through his work plan.    Pt is concerned he will need to file for bankruptcy if he needs to pay all of the medical bills.  CSW reminded pt of the hardship settlement for his [facility] balance and encouraged pt to call to apply.  Pt to reach out to CSW if the radiation bill ends up not being covered.  CSW to remain available as appropriate throughout duration of treatment.      Henriette Combs, LCSW

## 2022-09-15 NOTE — Telephone Encounter (Signed)
Oral Oncology Patient Advocate Encounter  Change in insurance   Received notification that prior authorization for Cabometyx is required.   PA submitted on 09/15/22  Key [id number]  Status is pending     Berdine Addison, [place] [facility] [place]  [phone] (phone) [phone] (fax) 09/15/2022 4:07 PM

## 2022-09-16 ENCOUNTER — Other Ambulatory Visit: Payer: Self-pay

## 2022-09-16 ENCOUNTER — Other Ambulatory Visit (HOSPITAL_COMMUNITY): Payer: Self-pay

## 2022-09-16 DIAGNOSIS — C649 Malignant neoplasm of unspecified kidney, except renal pelvis: Secondary | ICD-10-CM

## 2022-09-16 MED ORDER — CABOZANTINIB S-MALATE 60 MG PO TABS: 60.0000 mg | ORAL_TABLET | Freq: Every day | ORAL | 2 refills | Status: DC

## 2022-09-16 NOTE — Telephone Encounter (Signed)
Script has been e[facility]scribed to [facility] and all supporting documents, including co-pay card, were faxed to [facility]. Called and spoke to patient to let them know about pharmacy change. Patient expressed understanding and knows to expect a call from [facility] to set up fill and delivery of medication. Patient also knows to call me at [phone] with any questions or concerns regarding co-pay or receiving medication.    Ardeen Fillers, CPhT Oncology Pharmacy Patient Advocate  [facility]  [phone] (phone) [phone] (fax) 09/16/2022 1:06 PM

## 2022-09-19 NOTE — Progress Notes (Signed)
[facility] [address] [place] [address]    Clinic Day:  09/20/2022  Referring physician: [name], FNP  Patient Care Team: [name], FNP as PCP - General (Family Medicine) Therese Sarah, RN as Oncology Nurse Navigator (Medical Oncology) Doreatha Massed, MD as Medical Oncologist (Medical Oncology)   ASSESSMENT & PLAN:   Assessment: Metastatic kidney cancer to the bones: - CT chest lung cancer screening scan (08/31/2021): Lung RADS 1S.  Possible soft tissue fullness in the upper pole of the left kidney. - US renal (09/15/2021): Mass with solid component in the upper pole of the left kidney measuring 6.1 cm, highly suspicious for RCC. - MRI of the abdomen (09/22/2021): 6.5 cm left kidney upper pole solid enhancing renal mass favoring RCC.  Scattered enhancing bone lesions suspicious for osseous metastatic disease, largest at L3 and in the central sacrum.  1.3 x 1.4 cm mass of the left adrenal gland has indeterminate characteristics by MRI but on CT chest seems to have a low-density favoring adenoma.  No tumor thrombus in the left renal vein. - PET scan (10/07/2021): Hypermetabolic left kidney mass with SUV 7.1.  Bilateral abdominal retroperitoneal hypermetabolic lymph nodes.  Multifocal bone metastasis.  Central sacrum, inferior aspect of L3 vertebral body, T8 body, C7 spinous process and medial right clavicle. 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  Biopsy shows a poorly differentiated neoplasm, IHC positive for CK8/18 (subset), CD10 and cytokeratin AE 1/3.  Cells are negative for CK20, S100, HMB45, SOX10, calretinin, P504S, prostein, PSA, desmin and CDX2. - I have talked to our pathologist Dr. Charm Barges who thought it is most likely clear-cell histology although IHC does not support it. - IMDC criteria: 1 risk factor with less than 1 year from time of diagnosis to systemic  therapy.  Intermediate risk group - Opdivo and Yervoy cycle 1 started on 11/17/2021.  Discontinued on 08/01/2022 due to progression. 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  He is a potential candidate for belzutifan on further progression. - Cabozantinib 60 mg daily started on 08/03/2022 - Germline mutation testing is negative.  2. Social/family history: - His nephew lives with him.  He has a 12-year-old daughter who was given for adoption.  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  He also did warehouse work prior to that.  He is a current active smoker, 1 pack/day for close to 40 years.  He drinks 1 beer at night. - Maternal aunt had breast cancer.  Maternal cousin has prostate cancer and another maternal cousin had breast cancer.  Maternal cousin also had kidney removed, thought to be secondary to cancer.  Paternal grandfather had lung cancer.  His sister reportedly diagnosed with stage IV kidney cancer.   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 - He completed radiation last week, 5 sessions. - Since last week he reported decreased oral intake of both solids and liquids.  He reported decreased appetite.  He is sleeping a lot.  He also reported that his throat burns on drinking.  He felt tired more in the mornings and energy improves gradually. - Reviewed labs today: Normal LFTs and creatinine.  CBC grossly normal. - He will receive 1 L normal saline today. - Will decrease cabozantinib to 40  mg daily. - Will give him treatment for oral thrush with Diflucan. - RTC 2 weeks for follow-up.  2.  Upper back pain: - XRT to T4-T10, 2000 cGy 5 fractions from 09/12/2022 through 09/16/2022. - Pain has improved.  He did not take hydrocodone in the last 2 days.  3.  Bone metastatic disease: - We will start  denosumab after dental treatments done.   4.  Weight loss: - He has not been eating for the past few days.  He is drinking 1 boost/Ensure per day. - He lost 11 pounds in the last 1 week. - Recommend increasing Ensure and oral intake.   5.  Hypertension: - Cabozantinib induced.  Today blood pressure is 137/110. - Will increase his amlodipine to 10 mg daily.  No orders of the defined types were placed in this encounter.     I,Katie Daubenspeck,acting as a Neurosurgeon for Doreatha Massed, MD.,have documented all relevant documentation on the behalf of Doreatha Massed, MD,as directed by  Doreatha Massed, MD while in the presence of Doreatha Massed, MD.   I, Doreatha Massed MD, have reviewed the above documentation for accuracy and completeness, and I agree with the above.   Doreatha Massed, MD   4/9/20246:00 PM  CHIEF COMPLAINT:   Diagnosis: metastatic kidney cancer    Cancer Staging  Metastatic renal cell carcinoma to bone Staging form: Kidney, AJCC 8th Edition - Clinical stage from 11/04/2021: Stage IV (cT1b, cN1, pM1) - Unsigned    Prior Therapy: Nivolumab + Ipilimumab q21d / Nivolumab q28d   Current Therapy:  Cabozantinib    HISTORY OF PRESENT ILLNESS:   Oncology History  Metastatic renal cell carcinoma to bone  11/04/2021 Initial Diagnosis   Metastatic renal cell carcinoma to bone (HCC)   11/17/2021 - 02/10/2022 Chemotherapy   Patient is on Treatment Plan : RENAL CELL CARCINOMA Nivolumab + Ipilimumab q21d / Nivolumab q28d     11/17/2021 - 07/04/2022 Chemotherapy   Patient is on Treatment Plan : RENAL CELL CARCINOMA Nivolumab (3) + Ipilimumab (1) q21d x 4 cycles  / Nivolumab (480) q28d        INTERVAL HISTORY:   Jacob Reynolds is a 57 y.o. male presenting to clinic today for follow up of metastatic kidney cancer. He was last seen by me on 09/05/22.  Since his last visit, he completed radiation therapy on 09/16/22.  Today, he states that he is doing well  overall. His appetite level is at 10%. His energy level is at 50%.  PAST MEDICAL HISTORY:   Past Medical History: Past Medical History:  Diagnosis Date   Plantar fasciitis    Port-A-Cath in place 11/10/2021    Surgical History: Past Surgical History:  Procedure Laterality Date   IR IMAGING GUIDED PORT INSERTION  10/15/2021    Social History: Social History   Socioeconomic History   Marital status: Single    Spouse name: Not on file   Number of children: 1   Years of education: 11   Highest education level: 11th grade  Occupational History   Not on file  Tobacco Use   Smoking status: Every Day    Packs/day: 1.00    Years: 36.00    Additional pack years: 0.00    Total pack years: 36.00    Types: Cigarettes    Start date: 06/13/1978   Smokeless tobacco: Never  Vaping Use   Vaping Use: Never used  Substance and Sexual Activity   Alcohol use: Yes    Alcohol/week: 7.0 standard drinks  of alcohol    Types: 7 Cans of beer per week   Drug use: Yes    Frequency: 1.0 times per week    Types: Marijuana   Sexual activity: Not on file  Other Topics Concern   Not on file  Social History Narrative   Not on file   Social Determinants of Health   Financial Resource Strain: Not on file  Food Insecurity: Not on file  Transportation Needs: Not on file  Physical Activity: Not on file  Stress: Not on file  Social Connections: Not on file  Intimate Partner Violence: Not on file    Family History: Family History  Problem Relation Age of Onset   Hypertension Mother    Parkinson's disease Father    Breast cancer Maternal Aunt        dx >50   Lung cancer Paternal Grandfather    Breast cancer Cousin        dx 7050s   Prostate cancer Cousin        dx 350s    Current Medications:  Current Outpatient Medications:    albuterol (VENTOLIN HFA) 108 (90 Base) MCG/ACT inhaler, Inhale 2 puffs into the lungs every 6 (six) hours as needed for wheezing or shortness of breath., Disp: 18  g, Rfl: 1   Budeson-Glycopyrrol-Formoterol (BREZTRI AEROSPHERE) 160-9-4.8 MCG/ACT AERO, Inhale 2 puffs into the lungs 2 (two) times daily., Disp: 10.7 g, Rfl: 11   cabozantinib (CABOMETYX) 40 MG tablet, Take 1 tablet (40 mg total) by mouth daily. Take on an empty stomach, 1 hour before or 2 hours after meals., Disp: 30 tablet, Rfl: 0   HYDROcodone-acetaminophen (NORCO) 10-325 MG tablet, Take 1 tablet by mouth every 12 (twelve) hours as needed., Disp: 60 tablet, Rfl: 0   loperamide (IMODIUM) 2 MG capsule, Take 1 capsule (2 mg total) by mouth as needed for diarrhea or loose stools., Disp: 30 capsule, Rfl: 0   meloxicam (MOBIC) 15 MG tablet, Take 1 tablet (15 mg total) by mouth daily as needed for pain., Disp: 30 tablet, Rfl: 0   prochlorperazine (COMPAZINE) 10 MG tablet, Take 1 tablet (10 mg total) by mouth every 6 (six) hours as needed for nausea or vomiting., Disp: 30 tablet, Rfl: 6   rosuvastatin (CRESTOR) 5 MG tablet, Take 1 tablet (5 mg total) by mouth daily., Disp: 90 tablet, Rfl: 3   traMADol (ULTRAM) 50 MG tablet, Take 1 tablet (50 mg total) by mouth every 8 (eight) hours as needed., Disp: 30 tablet, Rfl: 0   amLODipine (NORVASC) 10 MG tablet, Take 1 tablet (10 mg total) by mouth daily., Disp: 30 tablet, Rfl: 6   fluconazole (DIFLUCAN) 100 MG tablet, Take 1 tablet (100 mg total) by mouth daily. Take 2 tablets by mouth on the first day then one tablet by mouth daily until complete, Disp: 6 tablet, Rfl: 0   ondansetron (ZOFRAN-ODT) 8 MG disintegrating tablet, Take 1 tablet (8 mg total) by mouth every 8 (eight) hours as needed for nausea or vomiting., Disp: 60 tablet, Rfl: 3 No current facility-administered medications for this visit.  Facility-Administered Medications Ordered in Other Visits:    diphenhydrAMINE (BENADRYL) 50 MG/ML injection, , , ,    famotidine (PEPCID) 20-0.9 MG/50ML-% IVPB, , , ,    sodium chloride flush (NS) 0.9 % injection 10 mL, 10 mL, Intravenous, PRN, Doreatha MassedKatragadda,  Chaka Jefferys, MD, 10 mL at 09/20/22 1410   Allergies: Allergies  Allergen Reactions   Iodine     Throat felt like  closed Was in hospital for allergy and almost died- per patient     REVIEW OF SYSTEMS:   Review of Systems  Constitutional:  Positive for fatigue. Negative for chills and fever.  HENT:   Positive for trouble swallowing. Negative for lump/mass, mouth sores, nosebleeds and sore throat.   Eyes:  Negative for eye problems.  Respiratory:  Negative for cough and shortness of breath.   Cardiovascular:  Negative for chest pain, leg swelling and palpitations.  Gastrointestinal:  Positive for nausea and vomiting. Negative for abdominal pain, constipation and diarrhea.  Genitourinary:  Negative for bladder incontinence, difficulty urinating, dysuria, frequency, hematuria and nocturia.   Musculoskeletal:  Negative for arthralgias, back pain, flank pain, myalgias and neck pain.  Skin:  Negative for itching and rash.  Neurological:  Negative for dizziness, headaches and numbness.  Hematological:  Does not bruise/bleed easily.  Psychiatric/Behavioral:  Negative for depression, sleep disturbance and suicidal ideas. The patient is not nervous/anxious.   All other systems reviewed and are negative.    VITALS:   Blood pressure (!) 137/114.  Wt Readings from Last 3 Encounters:  09/20/22 160 lb 9.6 oz (72.8 kg)  09/05/22 171 lb 9.6 oz (77.8 kg)  09/05/22 171 lb 9.6 oz (77.8 kg)    There is no height or weight on file to calculate BMI.  Performance status (ECOG): 1 - Symptomatic but completely ambulatory  PHYSICAL EXAM:   Physical Exam Vitals and nursing note reviewed. Exam conducted with a chaperone present.  Constitutional:      Appearance: Normal appearance.  HENT:     Mouth/Throat:     Comments: Oral thrush present. Cardiovascular:     Rate and Rhythm: Normal rate and regular rhythm.     Pulses: Normal pulses.     Heart sounds: Normal heart sounds.  Pulmonary:     Effort:  Pulmonary effort is normal.     Breath sounds: Normal breath sounds.  Abdominal:     Palpations: Abdomen is soft. There is no hepatomegaly, splenomegaly or mass.     Tenderness: There is no abdominal tenderness.  Musculoskeletal:     Right lower leg: No edema.     Left lower leg: No edema.  Lymphadenopathy:     Cervical: No cervical adenopathy.     Right cervical: No superficial, deep or posterior cervical adenopathy.    Left cervical: No superficial, deep or posterior cervical adenopathy.     Upper Body:     Right upper body: No supraclavicular or axillary adenopathy.     Left upper body: No supraclavicular or axillary adenopathy.  Neurological:     General: No focal deficit present.     Mental Status: He is alert and oriented to person, place, and time.  Psychiatric:        Mood and Affect: Mood normal.        Behavior: Behavior normal.     LABS:      Latest Ref Rng & Units 09/20/2022   10:23 AM 09/05/2022    2:22 PM 08/26/2022    9:10 AM  CBC  WBC 4.0 - 10.5 K/uL 4.8  6.8  8.1   Hemoglobin 13.0 - 17.0 g/dL 16.1  09.6  04.5   Hematocrit 39.0 - 52.0 % 46.1  45.5  43.1   Platelets 150 - 400 K/uL 231  226  285       Latest Ref Rng & Units 09/20/2022   10:23 AM 09/05/2022    2:22 PM 08/26/2022  9:10 AM  CMP  Glucose 70 - 99 mg/dL 294  86  765   BUN 6 - 20 mg/dL 17  18  14    Creatinine 0.61 - 1.24 mg/dL 4.65  0.35  4.65   Sodium 135 - 145 mmol/L 133  135  135   Potassium 3.5 - 5.1 mmol/L 3.8  4.1  4.1   Chloride 98 - 111 mmol/L 101  103  100   CO2 22 - 32 mmol/L 24  24  25    Calcium 8.9 - 10.3 mg/dL 8.5  8.3  8.5   Total Protein 6.5 - 8.1 g/dL 7.5  6.9  7.3   Total Bilirubin 0.3 - 1.2 mg/dL 0.8  0.4  0.4   Alkaline Phos 38 - 126 U/L 86  83  81   AST 15 - 41 U/L 29  40  30   ALT 0 - 44 U/L 24  34  28      No results found for: "CEA1", "CEA" / No results found for: "CEA1", "CEA" Lab Results  Component Value Date   PSA1 1.3 08/05/2021   No results found for:  "KCL275" No results found for: "CAN125"  No results found for: "TOTALPROTELP", "ALBUMINELP", "A1GS", "A2GS", "BETS", "BETA2SER", "GAMS", "MSPIKE", "SPEI" No results found for: "TIBC", "FERRITIN", "IRONPCTSAT" Lab Results  Component Value Date   LDH 144 11/04/2021     STUDIES:   MR Thoracic Spine W Wo Contrast  Result Date: 09/02/2022 CLINICAL DATA:  Metastatic renal cell carcinoma. EXAM: MRI THORACIC WITHOUT AND WITH CONTRAST TECHNIQUE: Multiplanar and multiecho pulse sequences of the thoracic spine were obtained without and with intravenous contrast. CONTRAST:  7.67mL GADAVIST GADOBUTROL 1 MMOL/ML IV SOLN COMPARISON:  PET-CT 07/28/2022 and chest CT 07/15/2022. FINDINGS: Alignment:  Physiologic. Vertebrae: Widespread heterogeneity of the bone marrow throughout the visualized cervical and thoracic spine. At the site of the previously demonstrated hypermetabolic metastasis within the T6 vertebral body on PET-CT there is decreased T1 signal and heterogeneous enhancement suspicious for residual viable disease. It is difficult to be definitive about the presence or absence of active disease within the additional vertebral bodies given the underlying marrow heterogeneity. There are rounded foci of decreased T1 signal in the T10 and T12 vertebral bodies, without definite abnormal enhancement. No evidence of pathologic fracture or epidural tumor. Cord: The thoracic cord appears normal in signal and caliber.No evidence of abnormal intradural enhancement. Paraspinal and other soft tissues: No significant paraspinal findings. New moderate size left pleural effusion with mild dependent atelectasis at both lung bases. Disc levels: Sagittal localizing images of the cervical spine demonstrate multilevel spondylosis, greatest at C5-6 and C6-7. In the thoracic spine, there is only mild spondylosis and no evidence of significant disc herniation, spinal stenosis or nerve root encroachment. IMPRESSION: 1. Widespread marrow  heterogeneity throughout the visualized cervical and thoracic spine limits assessment for metastatic disease. Signal changes and enhancement at the site of the previously demonstrated hypermetabolic lesion within the T6 vertebral body on PET-CT suspicious for residual viable disease. It is difficult to be definitive about the presence or absence of active disease within the additional vertebral bodies. PET-CT follow-up suggested. 2. No evidence of pathologic fracture or epidural tumor. 3. New moderate size left pleural effusion with mild dependent atelectasis at both lung bases. 4. Mild thoracic spondylosis without significant disc herniation, spinal stenosis or nerve root encroachment. Electronically Signed   By: Carey Bullocks M.D.   On: 09/02/2022 16:14   CT MAXILLOFACIAL WO CONTRAST  Result Date: 08/26/2022 CLINICAL DATA:  Provided history: Jaw pain. Right mandibular pain, dental infection versus osteonecrosis of the jaw. History of renal cell carcinoma metastatic to bone, currently on chemotherapy. EXAM: CT MAXILLOFACIAL WITHOUT CONTRAST TECHNIQUE: Multidetector CT imaging of the maxillofacial structures was performed. Multiplanar CT image reconstructions were also generated. RADIATION DOSE REDUCTION: This exam was performed according to the departmental dose-optimization program which includes automated exposure control, adjustment of the mA and/or kV according to patient size and/or use of iterative reconstruction technique. COMPARISON:  PET-CT 07/28/2022. Report from head CT 11/09/2019 (images unavailable). FINDINGS: Osseous: Periapical lucency surrounding the right mandibular second premolar tooth consistent with periodontal disease (series 9, image 43). The left mandibular first molar tooth is carious. No acute or aggressive osseous lesion is identified elsewhere. Orbits: No orbital mass or acute orbital finding. Sinuses: Mucosal thickening and fluid within the inferior frontal sinuses, bilaterally.  Mucosal thickening and fluid within bilateral ethmoid air cells, overall moderate to severe. Mild mucosal thickening within the bilateral sphenoid sinuses. Trace mucosal thickening within the right maxillary sinus. Mild mucosal thickening within the left maxillary sinus. Soft tissues: Right perimandibular soft tissue swelling and edema (for instance as seen on series 6, image 40). No soft tissue abscess is identified on this noncontrast examination. Limited intracranial: No evidence of an acute intracranial abnormality within the field of view. Impressions #1 and #2 will be called to the ordering clinician or representative by the Radiologist Assistant, and communication documented in the PACS or [employer]. IMPRESSION: 1. Right perimandibular soft tissue swelling and edema, nonspecific but possibly reflecting facial cellulitis. No soft tissue abscess is identified on this non-contrast examination. 2. Periapical lucency surrounding the adjacent right mandibular second premolar tooth consistent with periodontal disease, and possible periapical abscess. 3. The left mandibular first molar tooth is carious. 4. No acute or aggressive osseous lesion identified elsewhere. 5. Paranasal sinus disease, as described. Electronically Signed   By: Jackey Loge D.O.   On: 08/26/2022 15:08

## 2022-09-20 ENCOUNTER — Inpatient Hospital Stay: Payer: 59

## 2022-09-20 ENCOUNTER — Encounter: Payer: Self-pay | Admitting: Hematology

## 2022-09-20 ENCOUNTER — Inpatient Hospital Stay (HOSPITAL_BASED_OUTPATIENT_CLINIC_OR_DEPARTMENT_OTHER): Payer: 59 | Admitting: Hematology

## 2022-09-20 VITALS — BP 137/114

## 2022-09-20 VITALS — BP 141/107 | HR 97 | Temp 96.6°F | Resp 18 | Wt 160.6 lb

## 2022-09-20 DIAGNOSIS — C649 Malignant neoplasm of unspecified kidney, except renal pelvis: Secondary | ICD-10-CM

## 2022-09-20 DIAGNOSIS — M546 Pain in thoracic spine: Secondary | ICD-10-CM | POA: Insufficient documentation

## 2022-09-20 DIAGNOSIS — F17210 Nicotine dependence, cigarettes, uncomplicated: Secondary | ICD-10-CM | POA: Diagnosis not present

## 2022-09-20 DIAGNOSIS — Z8042 Family history of malignant neoplasm of prostate: Secondary | ICD-10-CM | POA: Diagnosis not present

## 2022-09-20 DIAGNOSIS — Z923 Personal history of irradiation: Secondary | ICD-10-CM | POA: Diagnosis not present

## 2022-09-20 DIAGNOSIS — I10 Essential (primary) hypertension: Secondary | ICD-10-CM | POA: Diagnosis not present

## 2022-09-20 DIAGNOSIS — Z803 Family history of malignant neoplasm of breast: Secondary | ICD-10-CM | POA: Diagnosis not present

## 2022-09-20 DIAGNOSIS — Z8051 Family history of malignant neoplasm of kidney: Secondary | ICD-10-CM | POA: Insufficient documentation

## 2022-09-20 DIAGNOSIS — E876 Hypokalemia: Secondary | ICD-10-CM | POA: Insufficient documentation

## 2022-09-20 DIAGNOSIS — C642 Malignant neoplasm of left kidney, except renal pelvis: Secondary | ICD-10-CM | POA: Diagnosis present

## 2022-09-20 DIAGNOSIS — C7951 Secondary malignant neoplasm of bone: Secondary | ICD-10-CM | POA: Insufficient documentation

## 2022-09-20 DIAGNOSIS — Z95828 Presence of other vascular implants and grafts: Secondary | ICD-10-CM

## 2022-09-20 DIAGNOSIS — Z801 Family history of malignant neoplasm of trachea, bronchus and lung: Secondary | ICD-10-CM | POA: Diagnosis not present

## 2022-09-20 LAB — CBC WITH DIFFERENTIAL/PLATELET
Abs Immature Granulocytes: 0.02 10*3/uL (ref 0.00–0.07)
Basophils Absolute: 0 10*3/uL (ref 0.0–0.1)
Basophils Relative: 1 %
Eosinophils Absolute: 0.1 10*3/uL (ref 0.0–0.5)
Eosinophils Relative: 2 %
HCT: 46.1 % (ref 39.0–52.0)
Hemoglobin: 14.9 g/dL (ref 13.0–17.0)
Immature Granulocytes: 0 %
Lymphocytes Relative: 13 %
Lymphs Abs: 0.6 10*3/uL — ABNORMAL LOW (ref 0.7–4.0)
MCH: 26.4 pg (ref 26.0–34.0)
MCHC: 32.3 g/dL (ref 30.0–36.0)
MCV: 81.7 fL (ref 80.0–100.0)
Monocytes Absolute: 0.6 10*3/uL (ref 0.1–1.0)
Monocytes Relative: 13 %
Neutro Abs: 3.4 10*3/uL (ref 1.7–7.7)
Neutrophils Relative %: 71 %
Platelets: 231 10*3/uL (ref 150–400)
RBC: 5.64 MIL/uL (ref 4.22–5.81)
RDW: 22.8 % — ABNORMAL HIGH (ref 11.5–15.5)
WBC: 4.8 10*3/uL (ref 4.0–10.5)
nRBC: 0 % (ref 0.0–0.2)

## 2022-09-20 LAB — COMPREHENSIVE METABOLIC PANEL
ALT: 24 U/L (ref 0–44)
AST: 29 U/L (ref 15–41)
Albumin: 2.8 g/dL — ABNORMAL LOW (ref 3.5–5.0)
Alkaline Phosphatase: 86 U/L (ref 38–126)
Anion gap: 8 (ref 5–15)
BUN: 17 mg/dL (ref 6–20)
CO2: 24 mmol/L (ref 22–32)
Calcium: 8.5 mg/dL — ABNORMAL LOW (ref 8.9–10.3)
Chloride: 101 mmol/L (ref 98–111)
Creatinine, Ser: 1.07 mg/dL (ref 0.61–1.24)
GFR, Estimated: >60 mL/min (ref >60)
Glucose, Bld: 134 mg/dL — ABNORMAL HIGH (ref 70–99)
Potassium: 3.8 mmol/L (ref 3.5–5.1)
Sodium: 133 mmol/L — ABNORMAL LOW (ref 135–145)
Total Bilirubin: 0.8 mg/dL (ref 0.3–1.2)
Total Protein: 7.5 g/dL (ref 6.5–8.1)

## 2022-09-20 LAB — MAGNESIUM: Magnesium: 2.1 mg/dL (ref 1.7–2.4)

## 2022-09-20 MED ORDER — HEPARIN SOD (PORK) LOCK FLUSH 100 UNIT/ML IV SOLN
500.0000 [IU] | Freq: Once | INTRAVENOUS | Status: AC
  Administered 2022-09-20: 500 [IU] via INTRAVENOUS

## 2022-09-20 MED ORDER — FLUCONAZOLE 100 MG PO TABS: 100.0000 mg | ORAL_TABLET | Freq: Every day | ORAL | 0 refills | Status: DC

## 2022-09-20 MED ORDER — ONDANSETRON 8 MG PO TBDP: 8.0000 mg | ORAL_TABLET | Freq: Three times a day (TID) | ORAL | 3 refills | Status: DC | PRN

## 2022-09-20 MED ORDER — SODIUM CHLORIDE 0.9% FLUSH
10.0000 mL | INTRAVENOUS | Status: DC | PRN
  Administered 2022-09-20 (×2): 10 mL via INTRAVENOUS

## 2022-09-20 MED ORDER — SODIUM CHLORIDE 0.9 % IV SOLN: Freq: Once | INTRAVENOUS | Status: AC

## 2022-09-20 MED ORDER — AMLODIPINE BESYLATE 10 MG PO TABS: 10.0000 mg | ORAL_TABLET | Freq: Every day | ORAL | 6 refills | Status: DC

## 2022-09-20 MED ORDER — CABOZANTINIB S-MALATE 40 MG PO TABS: 40.0000 mg | ORAL_TABLET | Freq: Every day | ORAL | 0 refills | Status: DC

## 2022-09-20 NOTE — Progress Notes (Signed)
Patient presents today for lab work and follow up appointment with Dr. Ellin Saba. Labs reviewed by MD. Orders received to infuse 1 liter of normal saline over 1.5 hours. Blood pressure elevated diastolic on arrival. MD aware. Patient denies any pain today. Patient takes his blood pressure medication in the evening per patient's words. Patient denies any headache, blurred vision or dizziness at present.   Reported blood pressure of 146/106. Orders received to instruct patient to take 2 Norvasc when he gets home. Script sent for 10 mg of Norvasc daily PO. Understanding verbalized.    1 liter of normal saline given today per MD orders. Tolerated infusion without adverse affects. Vital signs stable. No complaints at this time. Discharged from clinic ambulatory in stable condition. Alert and oriented x 3. F/U with [facility] as scheduled.

## 2022-09-20 NOTE — Patient Instructions (Addendum)
[facility] Discharge Instructions   You were seen and examined today by Dr. Ellin Saba.  He reviewed the results of your lab work which are normal/stable.   We sent a prescription for Zofran to your pharmacy. You may take this pill as prescribed along with the Compazine to help with feeling sick on your stomach. When you wake up you should take both pills, then you can take the Compazine every 6 hours and the Zofran every 8 hours.   You have thrush on your tongue. We will send a prescription for Diflucan to your pharmacy. You will take 2 pills the first day you take it and then one pill daily until complete.   You have lost 11 pounds in 2 weeks. You need to increase your intake of Ensure and regular food.   Return as scheduled.    Thank you for choosing [facility] to provide your oncology and hematology care.  To afford each patient quality time with our provider, please arrive at least 15 minutes before your scheduled appointment time.   If you have a lab appointment with the Cancer Center please come in thru the Main Entrance and check in at the main information desk.  You need to re-schedule your appointment should you arrive 10 or more minutes late.  We strive to give you quality time with our providers, and arriving late affects you and other patients whose appointments are after yours.  Also, if you no show three or more times for appointments you may be dismissed from the clinic at the providers discretion.     Again, thank you for choosing [facility].  Our hope is that these requests will decrease the amount of time that you wait before being seen by our physicians.       _____________________________________________________________  Should you have questions after your visit to [facility], please contact our office at [phone] and follow the prompts.  Our office hours are  8:00 a.m. and 4:30 p.m. Monday - Friday.  Please note that voicemails left after 4:00 p.m. may not be returned until the following business day.  We are closed weekends and major holidays.  You do have access to a nurse 24-7, just call the main number to the clinic [phone] and do not press any options, hold on the line and a nurse will answer the phone.    For prescription refill requests, have your pharmacy contact our office and allow 72 hours.    Due to Covid, you will need to wear a mask upon entering the hospital. If you do not have a mask, a mask will be given to you at the Main Entrance upon arrival. For doctor visits, patients may have 1 support person age 23 or older with them. For treatment visits, patients can not have anyone with them due to social distancing guidelines and our immunocompromised population.

## 2022-09-20 NOTE — Patient Instructions (Signed)
[facility]  Discharge Instructions: Thank you for choosing [facility] to provide your oncology and hematology care.  If you have a lab appointment with the Cancer Center - please note that after April 8th, 2024, all labs will be drawn in the cancer center.  You do not have to check in or register with the main entrance as you have in the past but will complete your check-in in the cancer center.  Wear comfortable clothing and clothing appropriate for easy access to any Portacath or PICC line.   We strive to give you quality time with your provider. You may need to reschedule your appointment if you arrive late (15 or more minutes).  Arriving late affects you and other patients whose appointments are after yours.  Also, if you miss three or more appointments without notifying the office, you may be dismissed from the clinic at the provider's discretion.      For prescription refill requests, have your pharmacy contact our office and allow 72 hours for refills to be completed.    Today you received the following chemotherapy and/or immunotherapy agents 1 liter of normal saline.       To help prevent nausea and vomiting after your treatment, we encourage you to take your nausea medication as directed.  BELOW ARE SYMPTOMS THAT SHOULD BE REPORTED IMMEDIATELY: *FEVER GREATER THAN 100.4 F (38 C) OR HIGHER *CHILLS OR SWEATING *NAUSEA AND VOMITING THAT IS NOT CONTROLLED WITH YOUR NAUSEA MEDICATION *UNUSUAL SHORTNESS OF BREATH *UNUSUAL BRUISING OR BLEEDING *URINARY PROBLEMS (pain or burning when urinating, or frequent urination) *BOWEL PROBLEMS (unusual diarrhea, constipation, pain near the anus) TENDERNESS IN MOUTH AND THROAT WITH OR WITHOUT PRESENCE OF ULCERS (sore throat, sores in mouth, or a toothache) UNUSUAL RASH, SWELLING OR PAIN  UNUSUAL VAGINAL DISCHARGE OR ITCHING   Items with * indicate a potential emergency and should be followed up as soon as  possible or go to the Emergency Department if any problems should occur.  Please show the CHEMOTHERAPY ALERT CARD or IMMUNOTHERAPY ALERT CARD at check-in to the Emergency Department and triage nurse.  Should you have questions after your visit or need to cancel or reschedule your appointment, please contact [facility] CENTER AT [facility] [phone]  and follow the prompts.  Office hours are 8:00 a.m. to 4:30 p.m. Monday - Friday. Please note that voicemails left after 4:00 p.m. may not be returned until the following business day.  We are closed weekends and major holidays. You have access to a nurse at all times for urgent questions. Please call the main number to the clinic [phone] and follow the prompts.  For any non-urgent questions, you may also contact your provider using MyChart. We now offer e-Visits for anyone 10 and older to request care online for non-urgent symptoms. For details visit [email or web address].   Also download the MyChart app! Go to the app store, search "MyChart", open the app, select [place], and log in with your MyChart username and password.

## 2022-09-20 NOTE — Progress Notes (Signed)
Patients port flushed without difficulty.  Good blood return noted with no bruising or swelling noted at site.  Remains accessed for possible treatment.

## 2022-09-23 ENCOUNTER — Other Ambulatory Visit: Payer: Self-pay

## 2022-09-23 MED ORDER — FLUCONAZOLE 100 MG PO TABS: 100.0000 mg | ORAL_TABLET | Freq: Every day | ORAL | 0 refills | Status: DC

## 2022-09-26 ENCOUNTER — Ambulatory Visit: Payer: Commercial Managed Care - PPO

## 2022-09-26 ENCOUNTER — Ambulatory Visit: Payer: Commercial Managed Care - PPO | Admitting: Hematology

## 2022-09-26 ENCOUNTER — Other Ambulatory Visit: Payer: Commercial Managed Care - PPO

## 2022-10-03 NOTE — Progress Notes (Signed)
[facility] 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  Possible soft tissue fullness in the upper pole of the left kidney. - US renal (09/15/2021): Mass with solid component in the upper pole of the left kidney measuring 6.1 cm, highly suspicious for RCC. - MRI of the abdomen (09/22/2021): 6.5 cm left kidney upper pole solid enhancing renal mass favoring RCC.  Scattered enhancing bone lesions suspicious for osseous metastatic disease, largest at L3 and in the central sacrum.  1.3 x 1.4 cm mass of the left adrenal gland has indeterminate characteristics by MRI but on CT chest seems to have a low-density favoring adenoma.  No tumor thrombus in the left renal vein. - PET scan (10/07/2021): Hypermetabolic left kidney mass with SUV 7.1.  Bilateral abdominal retroperitoneal hypermetabolic lymph nodes.  Multifocal bone metastasis.  Central sacrum, inferior aspect of L3 vertebral body, T8 body, C7 spinous process and medial right clavicle. 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  Biopsy shows a poorly differentiated neoplasm, IHC positive for CK8/18 (subset), CD10 and cytokeratin AE 1/3.  Cells are negative for CK20, S100, HMB45, SOX10, calretinin, P504S, prostein, PSA, desmin and CDX2. - I have talked to our pathologist Dr. Charm Barges who thought it is most likely clear-cell histology although IHC does not support it. - IMDC criteria: 1 risk factor with less than 1 year from time of diagnosis to systemic  therapy.  Intermediate risk group - Opdivo and Yervoy cycle 1 started on 11/17/2021.  Discontinued on 08/01/2022 due to progression. 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  He is a potential candidate for belzutifan on further progression. - Cabozantinib 60 mg daily started on 08/03/2022, dose decreased to 40 mg daily started on 09/28/2022 - Germline mutation testing is negative.   2. Social/family history: - His nephew lives with him.  He has a 65-year-old daughter who was given for adoption.  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  He also did warehouse work prior to that.  He is a current active smoker, 1 pack/day for close to 40 years.  He drinks 1 beer at night. - Maternal aunt had breast cancer.  Maternal cousin has prostate cancer and another maternal cousin had breast cancer.  Maternal cousin also had kidney removed, thought to be secondary to cancer.  Paternal grandfather had lung cancer.  His sister reportedly diagnosed with stage IV kidney cancer.    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 - At last visit, I decreased cabozantinib to 40 mg daily on 09/20/2022. - He actually started cabozantinib 40 mg daily on 09/28/2022. - He was also having dental problems and was unable to eat well.  He had tooth pulled yesterday and started eating better since yesterday.  He is drinking 2 to 3 cups of oval teen per day. - Labs today show severe low albumin of less  than 1.5 with calcium 4.5.  Severe hypokalemia with 2.1 potassium.  Magnesium is low at 1.1.  CBC was grossly normal. - I have recommended that he hold cabozantinib.  I will give him potassium and magnesium in the office today.  He was also given calcium intravenously. - We will start him on K-Dur 20 milliequivalents daily and magnesium 400  mg twice daily. - RTC 1 week for follow-up.  If she is not improving, we will consider changing to belzutifan.  2.  Upper back pain: - He finished radiation to the T4-T10.  Pain has improved.  3.  Bone metastatic disease: - We will start denosumab after dental extractions done completely.   4.  Weight loss: - He is drinking 2 to 3 cups of ovaltene per day. - He has been eating better since his tooth was pulled yesterday.   5.  Hypertension: - Continue amlodipine 10 mg daily.  Blood pressure is 144/90.  No orders of the defined types were placed in this encounter.     I,Katie Daubenspeck,acting as a Neurosurgeon for Doreatha Massed, MD.,have documented all relevant documentation on the behalf of Doreatha Massed, MD,as directed by  Doreatha Massed, MD while in the presence of Doreatha Massed, MD.   I, Doreatha Massed MD, have reviewed the above documentation for accuracy and completeness, and I agree with the above.   Doreatha Massed, MD   4/23/20245:58 PM  CHIEF COMPLAINT:   Diagnosis: metastatic kidney cancer    Cancer Staging  Metastatic renal cell carcinoma to bone Staging form: Kidney, AJCC 8th Edition - Clinical stage from 11/04/2021: Stage IV (cT1b, cN1, pM1) - Unsigned    Prior Therapy: Nivolumab + Ipilimumab q21d / Nivolumab q28d   Current Therapy:  Cabozantinib    HISTORY OF PRESENT ILLNESS:   Oncology History  Metastatic renal cell carcinoma to bone  11/04/2021 Initial Diagnosis   Metastatic renal cell carcinoma to bone (HCC)   11/17/2021 - 02/10/2022 Chemotherapy   Patient is on Treatment Plan : RENAL CELL CARCINOMA Nivolumab + Ipilimumab q21d / Nivolumab q28d     11/17/2021 - 07/04/2022 Chemotherapy   Patient is on Treatment Plan : RENAL CELL CARCINOMA Nivolumab (3) + Ipilimumab (1) q21d x 4 cycles  / Nivolumab (480) q28d        INTERVAL HISTORY:   Jacob Reynolds is a 57 y.o. male presenting to clinic today for follow up of metastatic kidney  cancer. He was last seen by me on 09/20/22.  Today, he states that he is doing well overall. His appetite level is at 75%. His energy level is at 70%.  PAST MEDICAL HISTORY:   Past Medical History: Past Medical History:  Diagnosis Date   Plantar fasciitis    Port-A-Cath in place 11/10/2021    Surgical History: Past Surgical History:  Procedure Laterality Date   IR IMAGING GUIDED PORT INSERTION  10/15/2021    Social History: Social History   Socioeconomic History   Marital status: Single    Spouse name: Not on file   Number of children: 1   Years of education: 11   Highest education level: 11th grade  Occupational History   Not on file  Tobacco Use   Smoking status: Every Day    Packs/day: 1.00    Years: 36.00    Additional pack years: 0.00    Total pack years: 36.00    Types: Cigarettes    Start date: 06/13/1978   Smokeless tobacco: Never  Vaping Use   Vaping  Use: Never used  Substance and Sexual Activity   Alcohol use: Yes    Alcohol/week: 7.0 standard drinks of alcohol    Types: 7 Cans of beer per week   Drug use: Yes    Frequency: 1.0 times per week    Types: Marijuana   Sexual activity: Not on file  Other Topics Concern   Not on file  Social History Narrative   Not on file   Social Determinants of Health   Financial Resource Strain: Not on file  Food Insecurity: Not on file  Transportation Needs: Not on file  Physical Activity: Not on file  Stress: Not on file  Social Connections: Not on file  Intimate Partner Violence: Not on file    Family History: Family History  Problem Relation Age of Onset   Hypertension Mother    Parkinson's disease Father    Breast cancer Maternal Aunt        dx >50   Lung cancer Paternal Grandfather    Breast cancer Cousin        dx 86s   Prostate cancer Cousin        dx 74s    Current Medications:  Current Outpatient Medications:    albuterol (VENTOLIN HFA) 108 (90 Base) MCG/ACT inhaler, Inhale 2 puffs into the  lungs every 6 (six) hours as needed for wheezing or shortness of breath., Disp: 18 g, Rfl: 1   amLODipine (NORVASC) 10 MG tablet, Take 1 tablet (10 mg total) by mouth daily., Disp: 30 tablet, Rfl: 6   Budeson-Glycopyrrol-Formoterol (BREZTRI AEROSPHERE) 160-9-4.8 MCG/ACT AERO, Inhale 2 puffs into the lungs 2 (two) times daily., Disp: 10.7 g, Rfl: 11   cabozantinib (CABOMETYX) 40 MG tablet, Take 1 tablet (40 mg total) by mouth daily. Take on an empty stomach, 1 hour before or 2 hours after meals., Disp: 30 tablet, Rfl: 0   fluconazole (DIFLUCAN) 100 MG tablet, Take 1 tablet (100 mg total) by mouth daily. Take 2 tablets by mouth on the first day then one tablet by mouth daily until complete, Disp: 6 tablet, Rfl: 0   HYDROcodone-acetaminophen (NORCO) 10-325 MG tablet, Take 1 tablet by mouth every 12 (twelve) hours as needed., Disp: 60 tablet, Rfl: 0   loperamide (IMODIUM) 2 MG capsule, Take 1 capsule (2 mg total) by mouth as needed for diarrhea or loose stools., Disp: 30 capsule, Rfl: 0   magnesium oxide (MAG-OX) 400 (240 Mg) MG tablet, Take 1 tablet (400 mg total) by mouth daily., Disp: 60 tablet, Rfl: 2   meloxicam (MOBIC) 15 MG tablet, Take 1 tablet (15 mg total) by mouth daily as needed for pain., Disp: 30 tablet, Rfl: 0   ondansetron (ZOFRAN-ODT) 8 MG disintegrating tablet, Take 1 tablet (8 mg total) by mouth every 8 (eight) hours as needed for nausea or vomiting., Disp: 60 tablet, Rfl: 3   potassium chloride SA (KLOR-CON M) 20 MEQ tablet, Take 1 tablet (20 mEq total) by mouth 2 (two) times daily., Disp: 30 tablet, Rfl: 2   prochlorperazine (COMPAZINE) 10 MG tablet, Take 1 tablet (10 mg total) by mouth every 6 (six) hours as needed for nausea or vomiting., Disp: 30 tablet, Rfl: 6   rosuvastatin (CRESTOR) 5 MG tablet, Take 1 tablet (5 mg total) by mouth daily., Disp: 90 tablet, Rfl: 3   traMADol (ULTRAM) 50 MG tablet, Take 1 tablet (50 mg total) by mouth every 8 (eight) hours as needed., Disp: 30  tablet, Rfl: 0 No current facility-administered medications for this  visit.  Facility-Administered Medications Ordered in Other Visits:    diphenhydrAMINE (BENADRYL) 50 MG/ML injection, , , ,    famotidine (PEPCID) 20-0.9 MG/50ML-% IVPB, , , ,    Allergies: Allergies  Allergen Reactions   Iodine     Throat felt like closed Was in hospital for allergy and almost died- per patient     REVIEW OF SYSTEMS:   Review of Systems  Constitutional:  Negative for chills, fatigue and fever.  HENT:   Negative for lump/mass, mouth sores, nosebleeds, sore throat and trouble swallowing.   Eyes:  Negative for eye problems.  Respiratory:  Positive for shortness of breath. Negative for cough.   Cardiovascular:  Negative for chest pain, leg swelling and palpitations.  Gastrointestinal:  Positive for constipation. Negative for abdominal pain, diarrhea, nausea and vomiting.  Genitourinary:  Negative for bladder incontinence, difficulty urinating, dysuria, frequency, hematuria and nocturia.   Musculoskeletal:  Negative for arthralgias, back pain, flank pain, myalgias and neck pain.  Skin:  Negative for itching and rash.  Neurological:  Negative for dizziness, headaches and numbness.  Hematological:  Does not bruise/bleed easily.  Psychiatric/Behavioral:  Negative for depression, sleep disturbance and suicidal ideas. The patient is not nervous/anxious.   All other systems reviewed and are negative.    VITALS:   There were no vitals taken for this visit.  Wt Readings from Last 3 Encounters:  10/04/22 164 lb 3.2 oz (74.5 kg)  09/20/22 160 lb 9.6 oz (72.8 kg)  09/05/22 171 lb 9.6 oz (77.8 kg)    There is no height or weight on file to calculate BMI.  Performance status (ECOG): 1 - Symptomatic but completely ambulatory  PHYSICAL EXAM:   Physical Exam Vitals and nursing note reviewed. Exam conducted with a chaperone present.  Constitutional:      Appearance: Normal appearance.  Cardiovascular:      Rate and Rhythm: Normal rate and regular rhythm.     Pulses: Normal pulses.     Heart sounds: Normal heart sounds.  Pulmonary:     Effort: Pulmonary effort is normal.     Breath sounds: Normal breath sounds.  Abdominal:     Palpations: Abdomen is soft. There is no hepatomegaly, splenomegaly or mass.     Tenderness: There is no abdominal tenderness.  Musculoskeletal:     Right lower leg: No edema.     Left lower leg: No edema.  Lymphadenopathy:     Cervical: No cervical adenopathy.     Right cervical: No superficial, deep or posterior cervical adenopathy.    Left cervical: No superficial, deep or posterior cervical adenopathy.     Upper Body:     Right upper body: No supraclavicular or axillary adenopathy.     Left upper body: No supraclavicular or axillary adenopathy.  Neurological:     General: No focal deficit present.     Mental Status: He is alert and oriented to person, place, and time.  Psychiatric:        Mood and Affect: Mood normal.        Behavior: Behavior normal.     LABS:      Latest Ref Rng & Units 10/04/2022    9:22 AM 09/20/2022   10:23 AM 09/05/2022    2:22 PM  CBC  WBC 4.0 - 10.5 K/uL 5.8  4.8  6.8   Hemoglobin 13.0 - 17.0 g/dL 65.7  84.6  96.2   Hematocrit 39.0 - 52.0 % 42.3  46.1  45.5  Platelets 150 - 400 K/uL 231  231  226       Latest Ref Rng & Units 10/04/2022    9:22 AM 09/20/2022   10:23 AM 09/05/2022    2:22 PM  CMP  Glucose 70 - 99 mg/dL 95  657  86   BUN 6 - 20 mg/dL Creatinine 0.61 - 1.24 mg/dL 8.46  9.62  9.52   Sodium 135 - 145 mmol/L 137  133  135   Potassium 3.5 - 5.1 mmol/L 2.1  3.8  4.1   Chloride 98 - 111 mmol/L 117  101  103   CO2 22 - 32 mmol/L Calcium 8.9 - 10.3 mg/dL 4.5  8.5  8.3   Total Protein 6.5 - 8.1 g/dL 3.6  7.5  6.9   Total Bilirubin 0.3 - 1.2 mg/dL 0.2  0.8  0.4   Alkaline Phos 38 - 126 U/L 54  86  83   AST 15 - 41 U/L 21  29  40   ALT 0 - 44 U/L 15  24  34      No results found  for: "CEA1", "CEA" / No results found for: "CEA1", "CEA" Lab Results  Component Value Date   PSA1 1.3 08/05/2021   No results found for: "WUX324" No results found for: "CAN125"  No results found for: "TOTALPROTELP", "ALBUMINELP", "A1GS", "A2GS", "BETS", "BETA2SER", "GAMS", "MSPIKE", "SPEI" No results found for: "TIBC", "FERRITIN", "IRONPCTSAT" Lab Results  Component Value Date   LDH 144 11/04/2021     STUDIES:   No results found.

## 2022-10-04 ENCOUNTER — Inpatient Hospital Stay: Payer: 59

## 2022-10-04 ENCOUNTER — Inpatient Hospital Stay (HOSPITAL_BASED_OUTPATIENT_CLINIC_OR_DEPARTMENT_OTHER): Payer: 59 | Admitting: Hematology

## 2022-10-04 DIAGNOSIS — C7951 Secondary malignant neoplasm of bone: Secondary | ICD-10-CM | POA: Diagnosis not present

## 2022-10-04 DIAGNOSIS — R634 Abnormal weight loss: Secondary | ICD-10-CM

## 2022-10-04 DIAGNOSIS — Z8042 Family history of malignant neoplasm of prostate: Secondary | ICD-10-CM

## 2022-10-04 DIAGNOSIS — C642 Malignant neoplasm of left kidney, except renal pelvis: Secondary | ICD-10-CM | POA: Diagnosis not present

## 2022-10-04 DIAGNOSIS — I10 Essential (primary) hypertension: Secondary | ICD-10-CM | POA: Diagnosis not present

## 2022-10-04 DIAGNOSIS — M899 Disorder of bone, unspecified: Secondary | ICD-10-CM

## 2022-10-04 DIAGNOSIS — N2889 Other specified disorders of kidney and ureter: Secondary | ICD-10-CM

## 2022-10-04 DIAGNOSIS — Z803 Family history of malignant neoplasm of breast: Secondary | ICD-10-CM | POA: Diagnosis not present

## 2022-10-04 DIAGNOSIS — C649 Malignant neoplasm of unspecified kidney, except renal pelvis: Secondary | ICD-10-CM

## 2022-10-04 DIAGNOSIS — E876 Hypokalemia: Secondary | ICD-10-CM | POA: Insufficient documentation

## 2022-10-04 DIAGNOSIS — Z801 Family history of malignant neoplasm of trachea, bronchus and lung: Secondary | ICD-10-CM | POA: Diagnosis not present

## 2022-10-04 DIAGNOSIS — F17210 Nicotine dependence, cigarettes, uncomplicated: Secondary | ICD-10-CM

## 2022-10-04 DIAGNOSIS — M549 Dorsalgia, unspecified: Secondary | ICD-10-CM

## 2022-10-04 LAB — COMPREHENSIVE METABOLIC PANEL
ALT: 15 U/L (ref 0–44)
AST: 21 U/L (ref 15–41)
Albumin: <1.5 g/dL — ABNORMAL LOW (ref 3.5–5.0)
Alkaline Phosphatase: 54 U/L (ref 38–126)
Anion gap: 6 (ref 5–15)
BUN: 11 mg/dL (ref 6–20)
CO2: 14 mmol/L — ABNORMAL LOW (ref 22–32)
Calcium: 4.5 mg/dL — CL (ref 8.9–10.3)
Chloride: 117 mmol/L — ABNORMAL HIGH (ref 98–111)
Creatinine, Ser: 0.51 mg/dL — ABNORMAL LOW (ref 0.61–1.24)
GFR, Estimated: >60 mL/min (ref >60)
Glucose, Bld: 95 mg/dL (ref 70–99)
Potassium: 2.1 mmol/L — CL (ref 3.5–5.1)
Sodium: 137 mmol/L (ref 135–145)
Total Bilirubin: 0.2 mg/dL — ABNORMAL LOW (ref 0.3–1.2)
Total Protein: 3.6 g/dL — ABNORMAL LOW (ref 6.5–8.1)

## 2022-10-04 LAB — CBC WITH DIFFERENTIAL/PLATELET
Abs Immature Granulocytes: 0.01 10*3/uL (ref 0.00–0.07)
Basophils Absolute: 0 10*3/uL (ref 0.0–0.1)
Basophils Relative: 0 %
Eosinophils Absolute: 0 10*3/uL (ref 0.0–0.5)
Eosinophils Relative: 1 %
HCT: 42.3 % (ref 39.0–52.0)
Hemoglobin: 13.8 g/dL (ref 13.0–17.0)
Immature Granulocytes: 0 %
Lymphocytes Relative: 11 %
Lymphs Abs: 0.6 10*3/uL — ABNORMAL LOW (ref 0.7–4.0)
MCH: 26.9 pg (ref 26.0–34.0)
MCHC: 32.6 g/dL (ref 30.0–36.0)
MCV: 82.5 fL (ref 80.0–100.0)
Monocytes Absolute: 0.6 10*3/uL (ref 0.1–1.0)
Monocytes Relative: 10 %
Neutro Abs: 4.6 10*3/uL (ref 1.7–7.7)
Neutrophils Relative %: 78 %
Platelets: 231 10*3/uL (ref 150–400)
RBC: 5.13 MIL/uL (ref 4.22–5.81)
RDW: 24.1 % — ABNORMAL HIGH (ref 11.5–15.5)
WBC: 5.8 10*3/uL (ref 4.0–10.5)
nRBC: 0 % (ref 0.0–0.2)

## 2022-10-04 LAB — MAGNESIUM: Magnesium: 1.1 mg/dL — ABNORMAL LOW (ref 1.7–2.4)

## 2022-10-04 MED ORDER — HEPARIN SOD (PORK) LOCK FLUSH 100 UNIT/ML IV SOLN: 500.0000 [IU] | Freq: Once | INTRAVENOUS | Status: DC

## 2022-10-04 MED ORDER — POTASSIUM CHLORIDE CRYS ER 20 MEQ PO TBCR: 20.0000 meq | EXTENDED_RELEASE_TABLET | Freq: Two times a day (BID) | ORAL | 2 refills | Status: DC

## 2022-10-04 MED ORDER — SODIUM CHLORIDE 0.9% FLUSH
10.0000 mL | Freq: Once | INTRAVENOUS | Status: AC
  Administered 2022-10-04: 10 mL via INTRAVENOUS

## 2022-10-04 MED ORDER — POTASSIUM CHLORIDE CRYS ER 20 MEQ PO TBCR
40.0000 meq | EXTENDED_RELEASE_TABLET | Freq: Once | ORAL | Status: AC
  Administered 2022-10-04: 40 meq via ORAL
  Filled 2022-10-04: qty 2

## 2022-10-04 MED ORDER — POTASSIUM CHLORIDE 10 MEQ/100ML IV SOLN
10.0000 meq | INTRAVENOUS | Status: AC
  Administered 2022-10-04 (×2): 10 meq via INTRAVENOUS
  Filled 2022-10-04 (×2): qty 100

## 2022-10-04 MED ORDER — SODIUM CHLORIDE 0.9 % IV SOLN: 2.0000 g | Freq: Once | INTRAVENOUS | Status: DC

## 2022-10-04 MED ORDER — MAGNESIUM OXIDE -MG SUPPLEMENT 400 (240 MG) MG PO TABS: 400.0000 mg | ORAL_TABLET | Freq: Every day | ORAL | 2 refills | Status: DC

## 2022-10-04 MED ORDER — MAGNESIUM SULFATE 2 GM/50ML IV SOLN
2.0000 g | INTRAVENOUS | Status: AC
  Administered 2022-10-04 (×2): 2 g via INTRAVENOUS
  Filled 2022-10-04 (×2): qty 50

## 2022-10-04 MED ORDER — SODIUM CHLORIDE 0.9 % IV SOLN: Freq: Once | INTRAVENOUS | Status: AC

## 2022-10-04 MED ORDER — CALCIUM GLUCONATE-NACL 1-0.675 GM/50ML-% IV SOLN
1.0000 g | INTRAVENOUS | Status: AC
  Administered 2022-10-04 (×2): 1000 mg via INTRAVENOUS
  Filled 2022-10-04: qty 50

## 2022-10-04 MED ORDER — SODIUM CHLORIDE 0.9% FLUSH: 10.0000 mL | Freq: Once | INTRAVENOUS | Status: DC

## 2022-10-04 MED ORDER — HEPARIN SOD (PORK) LOCK FLUSH 100 UNIT/ML IV SOLN
500.0000 [IU] | Freq: Once | INTRAVENOUS | Status: AC
  Administered 2022-10-04: 500 [IU] via INTRAVENOUS

## 2022-10-04 NOTE — Progress Notes (Addendum)
Patients port flushed without difficulty.  Good blood return noted with no bruising or swelling noted at site.  patient remains accessed for possible fluids.

## 2022-10-04 NOTE — Patient Instructions (Addendum)
[facility] Discharge Instructions   You were seen and examined today by Dr. Ellin Saba.  He reviewed the results of your lab work. Your potassium is critically low at 2.1. Your calcium is also critically low at 4.5. Your albumin (protein in the blood) is severely low at less than 1.5. This would be coming from lack of nutrition. You should increase the protein in your diet. Your magnesium is critically low at 1.1. We will give you IV magnesium, potassium, and calcium in the clinic today. He also sent a prescription for magnesium and potassium pills for your to take at home. Take as directed on the bottle.   We will see you back in 1 week.     Thank you for choosing [facility] at [facility] to provide your oncology and hematology care.  To afford each patient quality time with our provider, please arrive at least 15 minutes before your scheduled appointment time.   If you have a lab appointment with the Cancer Center please come in thru the Main Entrance and check in at the main information desk.  You need to re-schedule your appointment should you arrive 10 or more minutes late.  We strive to give you quality time with our providers, and arriving late affects you and other patients whose appointments are after yours.  Also, if you no show three or more times for appointments you may be dismissed from the clinic at the providers discretion.     Again, thank you for choosing [facility].  Our hope is that these requests will decrease the amount of time that you wait before being seen by our physicians.       _____________________________________________________________  Should you have questions after your visit to [facility], please contact our office at [phone] and follow the prompts.  Our office hours are 8:00 a.m. and 4:30 p.m. Monday - Friday.  Please note that voicemails left after 4:00 p.m. may  not be returned until the following business day.  We are closed weekends and major holidays.  You do have access to a nurse 24-7, just call the main number to the clinic [phone] and do not press any options, hold on the line and a nurse will answer the phone.    For prescription refill requests, have your pharmacy contact our office and allow 72 hours.    Due to Covid, you will need to wear a mask upon entering the hospital. If you do not have a mask, a mask will be given to you at the Main Entrance upon arrival. For doctor visits, patients may have 1 support person age 44 or older with them. For treatment visits, patients can not have anyone with them due to social distancing guidelines and our immunocompromised population.

## 2022-10-04 NOTE — Progress Notes (Signed)
Criticals given to Md today. Give K+ per our protocol and calcium gluconate 2 g IV per K.

## 2022-10-04 NOTE — Progress Notes (Signed)
CRITICAL VALUE ALERT Critical value received:  K+2.1, Calcium is 4.5 Date of notification:  10-04-22 Time of notification: 1000 Critical value read back:  Yes.   Nurse who received alert:  C.Johnnie Goynes RN MD notified time and response:  1003, Dr. Ellin Saba

## 2022-10-04 NOTE — Patient Instructions (Signed)
[facility]  Discharge Instructions: Thank you for choosing [facility] to provide your oncology and hematology care.  If you have a lab appointment with the Cancer Center - please note that after April 8th, 2024, all labs will be drawn in the cancer center.  You do not have to check in or register with the main entrance as you have in the past but will complete your check-in in the cancer center.  Wear comfortable clothing and clothing appropriate for easy access to any Portacath or PICC line.   We strive to give you quality time with your provider. You may need to reschedule your appointment if you arrive late (15 or more minutes).  Arriving late affects you and other patients whose appointments are after yours.  Also, if you miss three or more appointments without notifying the office, you may be dismissed from the clinic at the provider's discretion.      For prescription refill requests, have your pharmacy contact our office and allow 72 hours for refills to be completed.    Today you received the following chemotherapy and/or immunotherapy agents Potassium chloride, Magnesium Sulfate, Calcium gluconate      To help prevent nausea and vomiting after your treatment, we encourage you to take your nausea medication as directed.  BELOW ARE SYMPTOMS THAT SHOULD BE REPORTED IMMEDIATELY: *FEVER GREATER THAN 100.4 F (38 C) OR HIGHER *CHILLS OR SWEATING *NAUSEA AND VOMITING THAT IS NOT CONTROLLED WITH YOUR NAUSEA MEDICATION *UNUSUAL SHORTNESS OF BREATH *UNUSUAL BRUISING OR BLEEDING *URINARY PROBLEMS (pain or burning when urinating, or frequent urination) *BOWEL PROBLEMS (unusual diarrhea, constipation, pain near the anus) TENDERNESS IN MOUTH AND THROAT WITH OR WITHOUT PRESENCE OF ULCERS (sore throat, sores in mouth, or a toothache) UNUSUAL RASH, SWELLING OR PAIN  UNUSUAL VAGINAL DISCHARGE OR ITCHING   Items with * indicate a potential emergency and should be  followed up as soon as possible or go to the Emergency Department if any problems should occur.  Please show the CHEMOTHERAPY ALERT CARD or IMMUNOTHERAPY ALERT CARD at check-in to the Emergency Department and triage nurse.  Should you have questions after your visit or need to cancel or reschedule your appointment, please contact [facility]ENTER AT [facility] [phone]  and follow the prompts.  Office hours are 8:00 a.m. to 4:30 p.m. Monday - Friday. Please note that voicemails left after 4:00 p.m. may not be returned until the following business day.  We are closed weekends and major holidays. You have access to a nurse at all times for urgent questions. Please call the main number to the clinic [phone] and follow the prompts.  For any non-urgent questions, you may also contact your provider using MyChart. We now offer e-Visits for anyone 53 and older to request care online for non-urgent symptoms. For details visit [email or web address].   Also download the MyChart app! Go to the app store, search "MyChart", open the app, select [place], and log in with your MyChart username and password.

## 2022-10-04 NOTE — Addendum Note (Signed)
Addended by: Toniann Fail B on: 10/04/2022 01:11 PM   Modules accepted: Orders

## 2022-10-04 NOTE — Progress Notes (Signed)
Patient receiving Calcium gluconate IV, Magnesium sulfate IV, Potassium chloride IV and Potassium chloride PO, per providers order.    Stable during infusion without adverse affects.  Vital signs stable.  No complaints at this time.  Discharge from clinic ambulatory in stable condition.  Alert and oriented X 3.  Follow up with [facility] as scheduled.

## 2022-10-05 ENCOUNTER — Other Ambulatory Visit: Payer: Self-pay | Admitting: *Deleted

## 2022-10-05 ENCOUNTER — Other Ambulatory Visit: Payer: Self-pay | Admitting: Hematology

## 2022-10-05 NOTE — Telephone Encounter (Signed)
Refill approved for Cabometyx.  Patient is to continue therapy.

## 2022-10-08 ENCOUNTER — Observation Stay (HOSPITAL_COMMUNITY): Payer: 59

## 2022-10-08 ENCOUNTER — Other Ambulatory Visit: Payer: Self-pay

## 2022-10-08 ENCOUNTER — Inpatient Hospital Stay (HOSPITAL_COMMUNITY)
Admission: EM | Admit: 2022-10-08 | Discharge: 2022-10-10 | DRG: 641 | Disposition: A | Discharge status: Home / Self Care | Payer: 59 | Attending: Internal Medicine | Admitting: Internal Medicine

## 2022-10-08 ENCOUNTER — Encounter (HOSPITAL_COMMUNITY): Payer: Self-pay | Admitting: Emergency Medicine

## 2022-10-08 ENCOUNTER — Emergency Department (HOSPITAL_COMMUNITY): Payer: 59

## 2022-10-08 DIAGNOSIS — Z803 Family history of malignant neoplasm of breast: Secondary | ICD-10-CM

## 2022-10-08 DIAGNOSIS — E871 Hypo-osmolality and hyponatremia: Principal | ICD-10-CM | POA: Diagnosis present

## 2022-10-08 DIAGNOSIS — R188 Other ascites: Secondary | ICD-10-CM

## 2022-10-08 DIAGNOSIS — Z8042 Family history of malignant neoplasm of prostate: Secondary | ICD-10-CM

## 2022-10-08 DIAGNOSIS — J90 Pleural effusion, not elsewhere classified: Secondary | ICD-10-CM | POA: Diagnosis not present

## 2022-10-08 DIAGNOSIS — E782 Mixed hyperlipidemia: Secondary | ICD-10-CM | POA: Diagnosis present

## 2022-10-08 DIAGNOSIS — E860 Dehydration: Secondary | ICD-10-CM | POA: Diagnosis not present

## 2022-10-08 DIAGNOSIS — Z888 Allergy status to other drugs, medicaments and biological substances status: Secondary | ICD-10-CM | POA: Diagnosis not present

## 2022-10-08 DIAGNOSIS — Z820 Family history of epilepsy and other diseases of the nervous system: Secondary | ICD-10-CM

## 2022-10-08 DIAGNOSIS — Z743 Need for continuous supervision: Secondary | ICD-10-CM | POA: Diagnosis not present

## 2022-10-08 DIAGNOSIS — Z7951 Long term (current) use of inhaled steroids: Secondary | ICD-10-CM

## 2022-10-08 DIAGNOSIS — C7800 Secondary malignant neoplasm of unspecified lung: Secondary | ICD-10-CM | POA: Diagnosis not present

## 2022-10-08 DIAGNOSIS — G893 Neoplasm related pain (acute) (chronic): Secondary | ICD-10-CM

## 2022-10-08 DIAGNOSIS — F17210 Nicotine dependence, cigarettes, uncomplicated: Secondary | ICD-10-CM | POA: Diagnosis not present

## 2022-10-08 DIAGNOSIS — Z923 Personal history of irradiation: Secondary | ICD-10-CM | POA: Diagnosis not present

## 2022-10-08 DIAGNOSIS — J910 Malignant pleural effusion: Secondary | ICD-10-CM | POA: Diagnosis not present

## 2022-10-08 DIAGNOSIS — Z79899 Other long term (current) drug therapy: Secondary | ICD-10-CM

## 2022-10-08 DIAGNOSIS — Z801 Family history of malignant neoplasm of trachea, bronchus and lung: Secondary | ICD-10-CM

## 2022-10-08 DIAGNOSIS — Z791 Long term (current) use of non-steroidal anti-inflammatories (NSAID): Secondary | ICD-10-CM

## 2022-10-08 DIAGNOSIS — R918 Other nonspecific abnormal finding of lung field: Secondary | ICD-10-CM | POA: Diagnosis not present

## 2022-10-08 DIAGNOSIS — M549 Dorsalgia, unspecified: Secondary | ICD-10-CM | POA: Diagnosis not present

## 2022-10-08 DIAGNOSIS — R180 Malignant ascites: Secondary | ICD-10-CM

## 2022-10-08 DIAGNOSIS — C7951 Secondary malignant neoplasm of bone: Secondary | ICD-10-CM

## 2022-10-08 DIAGNOSIS — R112 Nausea with vomiting, unspecified: Secondary | ICD-10-CM | POA: Diagnosis not present

## 2022-10-08 DIAGNOSIS — C7972 Secondary malignant neoplasm of left adrenal gland: Secondary | ICD-10-CM | POA: Diagnosis not present

## 2022-10-08 DIAGNOSIS — R1084 Generalized abdominal pain: Secondary | ICD-10-CM | POA: Diagnosis not present

## 2022-10-08 DIAGNOSIS — Z8249 Family history of ischemic heart disease and other diseases of the circulatory system: Secondary | ICD-10-CM | POA: Diagnosis not present

## 2022-10-08 DIAGNOSIS — Z7189 Other specified counseling: Secondary | ICD-10-CM

## 2022-10-08 DIAGNOSIS — I10 Essential (primary) hypertension: Secondary | ICD-10-CM | POA: Diagnosis not present

## 2022-10-08 DIAGNOSIS — J9811 Atelectasis: Secondary | ICD-10-CM | POA: Diagnosis not present

## 2022-10-08 DIAGNOSIS — N1330 Unspecified hydronephrosis: Secondary | ICD-10-CM | POA: Diagnosis not present

## 2022-10-08 DIAGNOSIS — R000 Tachycardia, unspecified: Secondary | ICD-10-CM | POA: Diagnosis not present

## 2022-10-08 DIAGNOSIS — C649 Malignant neoplasm of unspecified kidney, except renal pelvis: Secondary | ICD-10-CM | POA: Diagnosis not present

## 2022-10-08 HISTORY — DX: Malignant (primary) neoplasm, unspecified: C80.1

## 2022-10-08 LAB — URINALYSIS, ROUTINE W REFLEX MICROSCOPIC
Bacteria, UA: NONE SEEN
Bilirubin Urine: NEGATIVE
Glucose, UA: NEGATIVE mg/dL
Hgb urine dipstick: NEGATIVE
Ketones, ur: 5 mg/dL — AB
Leukocytes,Ua: NEGATIVE
Nitrite: NEGATIVE
Protein, ur: 100 mg/dL — AB
Specific Gravity, Urine: 1.025 (ref 1.005–1.030)
pH: 6 (ref 5.0–8.0)

## 2022-10-08 LAB — COMPREHENSIVE METABOLIC PANEL
ALT: 38 U/L (ref 0–44)
AST: 40 U/L (ref 15–41)
Albumin: 2.5 g/dL — ABNORMAL LOW (ref 3.5–5.0)
Alkaline Phosphatase: 119 U/L (ref 38–126)
Anion gap: 10 (ref 5–15)
BUN: 19 mg/dL (ref 6–20)
CO2: 21 mmol/L — ABNORMAL LOW (ref 22–32)
Calcium: 8.7 mg/dL — ABNORMAL LOW (ref 8.9–10.3)
Chloride: 98 mmol/L (ref 98–111)
Creatinine, Ser: 1.03 mg/dL (ref 0.61–1.24)
GFR, Estimated: >60 mL/min (ref >60)
Glucose, Bld: 129 mg/dL — ABNORMAL HIGH (ref 70–99)
Potassium: 4.9 mmol/L (ref 3.5–5.1)
Sodium: 129 mmol/L — ABNORMAL LOW (ref 135–145)
Total Bilirubin: 0.7 mg/dL (ref 0.3–1.2)
Total Protein: 7.3 g/dL (ref 6.5–8.1)

## 2022-10-08 LAB — LACTATE DEHYDROGENASE, PLEURAL OR PERITONEAL FLUID: LD, Fluid: 982 U/L — ABNORMAL HIGH (ref 3–23)

## 2022-10-08 LAB — BODY FLUID CELL COUNT WITH DIFFERENTIAL
Eos, Fluid: 0 %
Lymphs, Fluid: 63 %
Monocyte-Macrophage-Serous Fluid: 17 % — ABNORMAL LOW (ref 50–90)
Neutrophil Count, Fluid: 20 % (ref 0–25)
Total Nucleated Cell Count, Fluid: 368 cu mm (ref 0–1000)

## 2022-10-08 LAB — CBC
HCT: 43.5 % (ref 39.0–52.0)
Hemoglobin: 14.6 g/dL (ref 13.0–17.0)
MCH: 27.1 pg (ref 26.0–34.0)
MCHC: 33.6 g/dL (ref 30.0–36.0)
MCV: 80.9 fL (ref 80.0–100.0)
Platelets: 221 10*3/uL (ref 150–400)
RBC: 5.38 MIL/uL (ref 4.22–5.81)
RDW: 24.8 % — ABNORMAL HIGH (ref 11.5–15.5)
WBC: 6.1 10*3/uL (ref 4.0–10.5)
nRBC: 0 % (ref 0.0–0.2)

## 2022-10-08 LAB — BODY FLUID CULTURE W GRAM STAIN

## 2022-10-08 LAB — GLUCOSE, PLEURAL OR PERITONEAL FLUID: Glucose, Fluid: 76 mg/dL

## 2022-10-08 LAB — LIPASE, BLOOD: Lipase: 25 U/L (ref 11–51)

## 2022-10-08 LAB — HIV ANTIBODY (ROUTINE TESTING W REFLEX): HIV Screen 4th Generation wRfx: NONREACTIVE

## 2022-10-08 LAB — MAGNESIUM: Magnesium: 1.9 mg/dL (ref 1.7–2.4)

## 2022-10-08 LAB — ALBUMIN, PLEURAL OR PERITONEAL FLUID: Albumin, Fluid: 2.1 g/dL

## 2022-10-08 MED ORDER — AMLODIPINE BESYLATE 10 MG PO TABS
10.0000 mg | ORAL_TABLET | Freq: Every day | ORAL | Status: DC
  Administered 2022-10-08 – 2022-10-10 (×3): 10 mg via ORAL
  Filled 2022-10-08 (×2): qty 1
  Filled 2022-10-08: qty 2

## 2022-10-08 MED ORDER — HYDROMORPHONE HCL 1 MG/ML IJ SOLN
1.0000 mg | Freq: Once | INTRAMUSCULAR | Status: AC
  Administered 2022-10-08: 1 mg via INTRAVENOUS
  Filled 2022-10-08: qty 1

## 2022-10-08 MED ORDER — NICOTINE 14 MG/24HR TD PT24
14.0000 mg | MEDICATED_PATCH | Freq: Every day | TRANSDERMAL | Status: DC
  Administered 2022-10-08 – 2022-10-10 (×3): 14 mg via TRANSDERMAL
  Filled 2022-10-08 (×3): qty 1

## 2022-10-08 MED ORDER — LORAZEPAM 2 MG/ML IJ SOLN
1.0000 mg | INTRAMUSCULAR | Status: DC | PRN
  Administered 2022-10-10: 1 mg via INTRAVENOUS
  Filled 2022-10-08: qty 1

## 2022-10-08 MED ORDER — ALBUMIN HUMAN 25 % IV SOLN
25.0000 g | Freq: Once | INTRAVENOUS | Status: AC
  Administered 2022-10-08: 25 g via INTRAVENOUS
  Filled 2022-10-08: qty 100

## 2022-10-08 MED ORDER — IOHEXOL 300 MG/ML  SOLN: 75.0000 mL | Freq: Once | INTRAMUSCULAR | Status: DC | PRN

## 2022-10-08 MED ORDER — LORAZEPAM 2 MG/ML IJ SOLN
1.0000 mg | Freq: Once | INTRAMUSCULAR | Status: AC
  Administered 2022-10-08: 1 mg via INTRAVENOUS
  Filled 2022-10-08: qty 1

## 2022-10-08 MED ORDER — SODIUM CHLORIDE 0.9% FLUSH
3.0000 mL | Freq: Two times a day (BID) | INTRAVENOUS | Status: DC
  Administered 2022-10-08 – 2022-10-09 (×4): 3 mL via INTRAVENOUS

## 2022-10-08 MED ORDER — LIDOCAINE HCL 1 % IJ SOLN
5.0000 mL | Freq: Once | INTRAMUSCULAR | Status: DC
  Filled 2022-10-08: qty 5

## 2022-10-08 MED ORDER — PROCHLORPERAZINE EDISYLATE 10 MG/2ML IJ SOLN: 10.0000 mg | INTRAMUSCULAR | Status: DC | PRN

## 2022-10-08 MED ORDER — ENOXAPARIN SODIUM 40 MG/0.4ML IJ SOSY
40.0000 mg | PREFILLED_SYRINGE | INTRAMUSCULAR | Status: DC
  Administered 2022-10-08 – 2022-10-09 (×2): 40 mg via SUBCUTANEOUS
  Filled 2022-10-08 (×2): qty 0.4

## 2022-10-08 MED ORDER — LACTATED RINGERS IV SOLN: INTRAVENOUS | Status: DC

## 2022-10-08 MED ORDER — LACTATED RINGERS IV BOLUS
1000.0000 mL | Freq: Once | INTRAVENOUS | Status: AC
  Administered 2022-10-08: 1000 mL via INTRAVENOUS

## 2022-10-08 MED ORDER — ALBUMIN HUMAN 25 % IV SOLN: 25.0000 g | Freq: Once | INTRAVENOUS | Status: DC

## 2022-10-08 MED ORDER — ROSUVASTATIN CALCIUM 5 MG PO TABS
5.0000 mg | ORAL_TABLET | Freq: Every day | ORAL | Status: DC
  Administered 2022-10-08 – 2022-10-10 (×3): 5 mg via ORAL
  Filled 2022-10-08 (×3): qty 1

## 2022-10-08 MED ORDER — ACETAMINOPHEN 650 MG RE SUPP: 650.0000 mg | Freq: Four times a day (QID) | RECTAL | Status: DC | PRN

## 2022-10-08 MED ORDER — BISACODYL 10 MG RE SUPP: 10.0000 mg | Freq: Every day | RECTAL | Status: DC | PRN

## 2022-10-08 MED ORDER — BUDESON-GLYCOPYRROL-FORMOTEROL 160-9-4.8 MCG/ACT IN AERO: 2.0000 | INHALATION_SPRAY | Freq: Two times a day (BID) | RESPIRATORY_TRACT | Status: DC

## 2022-10-08 MED ORDER — HYDROMORPHONE HCL 1 MG/ML IJ SOLN
1.0000 mg | INTRAMUSCULAR | Status: DC | PRN
  Administered 2022-10-08 – 2022-10-09 (×4): 1 mg via INTRAVENOUS
  Filled 2022-10-08 (×6): qty 1

## 2022-10-08 MED ORDER — UMECLIDINIUM BROMIDE 62.5 MCG/ACT IN AEPB
1.0000 | INHALATION_SPRAY | Freq: Every day | RESPIRATORY_TRACT | Status: DC
  Administered 2022-10-08 – 2022-10-10 (×2): 1 via RESPIRATORY_TRACT
  Filled 2022-10-08: qty 7

## 2022-10-08 MED ORDER — ONDANSETRON HCL 4 MG/2ML IJ SOLN: 4.0000 mg | Freq: Four times a day (QID) | INTRAMUSCULAR | Status: DC | PRN

## 2022-10-08 MED ORDER — ACETAMINOPHEN 325 MG PO TABS: 650.0000 mg | ORAL_TABLET | Freq: Four times a day (QID) | ORAL | Status: DC | PRN

## 2022-10-08 MED ORDER — FLUTICASONE FUROATE-VILANTEROL 100-25 MCG/ACT IN AEPB
1.0000 | INHALATION_SPRAY | Freq: Every day | RESPIRATORY_TRACT | Status: DC
  Administered 2022-10-08 – 2022-10-10 (×2): 1 via RESPIRATORY_TRACT
  Filled 2022-10-08: qty 28

## 2022-10-08 NOTE — ED Notes (Addendum)
Per Jearld Shines, Paracentesis and thoracentesis are done M-F. We do not have a radiologist/NP/PA on the week-ends. MD Clanford Laural Benes, included in chat. MD also made aware of albumin order that states it is to be given after paracentesis

## 2022-10-08 NOTE — ED Notes (Signed)
Per ultrasound at [facility], pt can arrive anytime before 2pm.

## 2022-10-08 NOTE — Assessment & Plan Note (Signed)
Patient is having significant pain involving abdomen and back. History of bony metastasis disease s/p radiation once. -Pain management with Dilaudid. -Patient take Norco and tramadol at home which can be restarted once able to tolerate p.o. -Palliative care consult

## 2022-10-08 NOTE — Assessment & Plan Note (Signed)
Patient had mild hyponatremia with sodium at 129.  Likely due to poor p.o. intake with persistent nausea and vomiting. -Giving some gentle fluid -Monitor sodium

## 2022-10-08 NOTE — H&P (Signed)
History and Physical    Patient: Jacob Reynolds:[id number] DOB: [birth year] DOA: 10/08/2022 DOS: the patient was seen and examined on 10/08/2022 PCP: [name], FNP  Patient coming from: Home  Chief Complaint:  Chief Complaint  Patient presents with   Abdominal Pain   Back Pain   Nausea   Emesis   HPI: Jacob Reynolds is a 57 y.o. male with medical history significant of stage IV renal cancer and hypertension presented to ED with intractable nausea, vomiting and abdominal pain.  Patient is in the process of changing medications as he continued to have disease progression on the prior regimen. Addition of a new medicine causes nausea and vomiting which continued to get worse despite stopping that medicine, to the point that he was unable to tolerate any p.o. intake over the past few days.  Also having worsening abdominal distention and pain.  He has back pain due to metastatic disease. He came to ED for pain and vomiting control.  Patient denies any other recent illnesses or sick contacts.  No diarrhea.  No recent bowel movement over the past few days.  Passing flatus.  Unable to take p.o. decreased appetite. No urinary symptoms.  ED course.  Vitals with mildly elevated blood pressure and mild tachycardia, labs pertinent for mild hyponatremia with sodium at 129, bicarb of 21.  Lipase normal CT abdomen with large left-sided pleural effusion and a small right-sided pleural effusion.  Large volume ascites.  There was also concern of lymphangitis spread of tumor. Also concern of disease progression-please see the full report.  Patient was admitted for intractable nausea and vomiting and for pain control.  Review of Systems: As mentioned in the history of present illness. All other systems reviewed and are negative. Past Medical History:  Diagnosis Date   Cancer (HCC)    Plantar fasciitis    Port-A-Cath in place 11/10/2021   Past Surgical History:  Procedure Laterality Date    IR IMAGING GUIDED PORT INSERTION  10/15/2021   Social History:  reports that he has been smoking cigarettes. He started smoking about 44 years ago. He has a 36.00 pack-year smoking history. He has never used smokeless tobacco. He reports current alcohol use of about 7.0 standard drinks of alcohol per week. He reports current drug use. Frequency: 1.00 time per week. Drug: Marijuana.  Allergies  Allergen Reactions   Iodine     Throat felt like closed Was in hospital for allergy and almost died- per patient     Family History  Problem Relation Age of Onset   Hypertension Mother    Parkinson's disease Father    Breast cancer Maternal Aunt        dx >50   Lung cancer Paternal Grandfather    Breast cancer Cousin        dx 74s   Prostate cancer Cousin        dx 47s    Prior to Admission medications   Medication Sig Start Date End Date Taking? Authorizing Provider  albuterol (VENTOLIN HFA) 108 (90 Base) MCG/ACT inhaler Inhale 2 puffs into the lungs every 6 (six) hours as needed for wheezing or shortness of breath. 08/08/22   [name], FNP  amLODipine (NORVASC) 10 MG tablet Take 1 tablet (10 mg total) by mouth daily. 09/20/22   Doreatha Massed, MD  Budeson-Glycopyrrol-Formoterol (BREZTRI AEROSPHERE) 160-9-4.8 MCG/ACT AERO Inhale 2 puffs into the lungs 2 (two) times daily. 08/08/22   [name], FNP  Turner Daniels  40 MG tablet TAKE 1 TABLET BY MOUTH 1 TIME A DAY 10/05/22   Doreatha Massed, MD  fluconazole (DIFLUCAN) 100 MG tablet Take 1 tablet (100 mg total) by mouth daily. Take 2 tablets by mouth on the first day then one tablet by mouth daily until complete 09/23/22   Doreatha Massed, MD  HYDROcodone-acetaminophen [facility]) 10-325 MG tablet Take 1 tablet by mouth every 12 (twelve) hours as needed. 09/05/22   Doreatha Massed, MD  loperamide (IMODIUM) 2 MG capsule Take 1 capsule (2 mg total) by mouth as needed for diarrhea or loose stools. 08/26/22   Carnella Guadalajara, PA-C  magnesium oxide (MAG-OX) 400 (240 Mg) MG tablet Take 1 tablet (400 mg total) by mouth daily. 10/04/22   Doreatha Massed, MD  meloxicam (MOBIC) 15 MG tablet Take 1 tablet (15 mg total) by mouth daily as needed for pain. 11/04/21 11/04/22  Doreatha Massed, MD  ondansetron (ZOFRAN-ODT) 8 MG disintegrating tablet Take 1 tablet (8 mg total) by mouth every 8 (eight) hours as needed for nausea or vomiting. 09/20/22   Doreatha Massed, MD  potassium chloride SA (KLOR-CON M) 20 MEQ tablet Take 1 tablet (20 mEq total) by mouth 2 (two) times daily. 10/04/22   Doreatha Massed, MD  prochlorperazine (COMPAZINE) 10 MG tablet Take 1 tablet (10 mg total) by mouth every 6 (six) hours as needed for nausea or vomiting. 08/01/22   Doreatha Massed, MD  rosuvastatin (CRESTOR) 5 MG tablet Take 1 tablet (5 mg total) by mouth daily. 08/08/22   [name], FNP  traMADol (ULTRAM) 50 MG tablet Take 1 tablet (50 mg total) by mouth every 8 (eight) hours as needed. 07/19/22   Doreatha Massed, MD    Physical Exam: Vitals:   10/08/22 0215 10/08/22 0410 10/08/22 0600 10/08/22 0630  BP:  (!) 164/124 (!) 142/117 (!) 138/114  Pulse:  99 (!) 103 100  Resp:  18 (!) 27 (!) 22  Temp:    98 F (36.7 C)  TempSrc:    Oral  SpO2:  96% 94% 94%  Weight: 72.6 kg     Height: 5\' 11"  (1.803 m)       General: Vital signs reviewed.  Malnourished gentleman, in no acute distress and cooperative with exam.  Head: Normocephalic and atraumatic. Eyes: EOMI, conjunctivae normal, no scleral icterus.  Neck: Supple, trachea midline, normal ROM,  Cardiovascular: RRR, S1 normal, S2 normal, no murmurs, gallops, or rubs. Pulmonary/Chest: Clear to auscultation bilaterally, no wheezes, rales, or rhonchi.  Decreased breath sound at bases Abdominal: Soft, mild diffuse tenderness, distended with fluid wave, BS positive. Extremities: No lower extremity edema bilaterally,  pulses symmetric and intact bilaterally. No cyanosis  or clubbing. Neurological: A&O x3, Strength is normal and symmetric bilaterally, cranial nerve II-XII are grossly intact, no focal motor deficit, sensory intact to light touch bilaterally.  Psychiatric: Normal mood and affect.   Data Reviewed: Prior data reviewed as mentioned above  Assessment and Plan: * Intractable nausea and vomiting Unable to tolerate p.o. for the past 1 week which daily get worse yesterday.  Patient was recently started on some cancer medication which worsen his nausea and vomiting and it was stopped. Apparently Ativan helped more than Zofran. -Admit to MedSurg under observation -Ordered Zofran, Phenergan and Ativan as needed -Clear liquid diet at patient's request -LR at 75 mill per hour until started taking good p.o.  Ascites, malignant CT abdomen with worsening large volume ascites which is contributory to his pain and discomfort.  Due to metastatic disease -US guided paracentesis ordered. -Albumin ordered to be given after paracentesis  Cancer-related pain Patient is having significant pain involving abdomen and back. History of bony metastasis disease s/p radiation once. -Pain management with Dilaudid. -Patient take Norco and tramadol at home which can be restarted once able to tolerate p.o. 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  CT abdomen was done which is concerning for large pleural effusion, left more than right and large volume ascites.  There was also concern of lymphangitic spread of tumor in the lung. Appears to have poor prognosis. -Ordered CT chest -Message sent to his oncologist Dr. Ellin Saba. -Palliative care consult  Essential hypertension -Continue home amlodipine  Mixed hyperlipidemia -Continue home Crestor  Hyponatremia Patient had mild hyponatremia with sodium at 129.  Likely due to poor p.o. intake with persistent  nausea and vomiting. -Giving some gentle fluid -Monitor sodium    Advance Care Planning:   Code Status: Full Code discussed with patient, stating that he does not want to give up yet.  Consults: Palliative care.  Also message sent to his oncologist  Family Communication: No family at bedside  Severity of Illness: The appropriate patient status for this patient is OBSERVATION. Observation status is judged to be reasonable and necessary in order to provide the required intensity of service to ensure the patient's safety. The patient's presenting symptoms, physical exam findings, and initial radiographic and laboratory data in the context of their medical condition is felt to place them at decreased risk for further clinical deterioration. Furthermore, it is anticipated that the patient will be medically stable for discharge from the hospital within 2 midnights of admission.   This record has been created using Conservation officer, historic buildings. Errors have been sought and corrected,but may not always be located. Such creation errors do not reflect on the standard of care.   Author: Arnetha Courser, MD 10/08/2022 7:03 AM  For on call review [email or web address].

## 2022-10-08 NOTE — ED Notes (Signed)
[facility], Tim, attempted to find another means of transportation for pt to be transported to [facility] for his paracentesis, as [facility] does not have another truck to send for pt, BLS truck will not be able to come and get him in time as [facility] states pt would need to be there before 2pm. MD made aware

## 2022-10-08 NOTE — ED Triage Notes (Signed)
Pt with c/o abdominal pain, back pain, nausea, and vomiting since last 1930 last night. Pt is a cancer pt and recently stopped the therapy he was on d/t complications and is set to discuss other therapy in place of one stopped. EMS states pt reports he vomited "at least 5 times" since their arrival. EMS placed IV and administered 4mg  Zofran IV PTA.

## 2022-10-08 NOTE — Consult Note (Cosign Needed Addendum)
Consultation Note Date: 10/08/2022   Patient Name: Jacob Reynolds  DOB: [birth year]  MRN: [id number]  Age / Sex: 57 y.o., male  PCP: [name], FNP Referring Physician: Cleora Fleet, MD  Reason for Consultation: Establishing goals of care  HPI/Patient Profile: 57 y.o. male  with past medical history of stage IV renal cancer and hypertension admitted on 10/08/2022 with nausea, vomiting, abdominal pain.   Patient is in the process of changing medications as he continued to have disease progression on the prior regimen. Addition of a new medicine causes nausea and vomiting which continued to get worse despite stopping that medicine, to the point that he was unable to tolerate any p.o. intake over the past few days.  Also having worsening abdominal distention and pain.  He has back pain due to metastatic disease.   PMT has been consulted to assist with goals of care conversation.  Clinical Assessment and Goals of Care:  I have reviewed medical records including EPIC notes, labs and imaging, discussed with RN, assessed the patient and then met at the bedside with patient to discuss diagnosis prognosis, GOC, EOL wishes, disposition and options.  I introduced Palliative Medicine as specialized medical care for people living with serious illness. It focuses on providing relief from the symptoms and stress of a serious illness. The goal is to improve quality of life for both the patient and the family.  We discussed a brief life review of the patient and then focused on their current illness.   I attempted to elicit values and goals of care important to the patient.    Medical History Review and Understanding:  Reviewed patient's acute illness as well as labs/imaging obtained in the ED, with emphasis on CT scanning showing disease progression.  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  Social History: Patient confirms he resides with his nephew.  Per oncology notes, he works at [employer] and worked at [employer].  He did not want to talk much about himself today.  Palliative Symptoms: Nausea is "so-so" 4/10 abdominal pain 5/10 back pain  Discussion: Patient's goals of care at this time are to "get rid of this pain, be able to eat, and keep going for as long as possible."  He does not have any short or long-term goals such as milestones or activities that he can think of.  In terms of his disease progression noted on today's imaging, he is still and states "I am used to it."  He wonders when able discharged so that he can continue discussing new medications with his oncologist.  We discussed that this will depend on when his pain and nausea/vomiting can be adequately controlled, as well as his oncologist being notified this morning.  Offered to assist with advocating for additional conversation with his oncologist if he has not heard back by tomorrow and he is appreciative.  He prefers to hold off on further conversations until tomorrow afternoon.  Assisted with ordering his dinner per his request.  Also counseled on his as needed medications available for pain and nausea/vomiting, encouraging him to request as needed.    Discussed the importance of continued conversation with family and the medical providers regarding overall plan of care and treatment options, ensuring decisions are within the context of the patient's values and GOCs.   Questions and concerns were addressed.  Hard Choices booklet  left for review. The family was encouraged to call with questions or concerns.  PMT will continue to support holistically.  SUMMARY OF RECOMMENDATIONS   -Continue full code/full scope treatment -Patient's goal are to his improve his pain, appetite, and prolong his  life is much as possible -Psychosocial and emotional support provided -PMT will continue to follow and support  Prognosis:  Poor long-term prognosis given disease progression and stage IV metastatic cancer, nutritional decline  Discharge Planning: To Be Determined      Primary Diagnoses: Present on Admission:  Mixed hyperlipidemia  Metastatic renal cell carcinoma to bone [facility])  Essential hypertension  Intractable nausea and vomiting  Ascites, malignant  Cancer-related pain  Hyponatremia  Physical Exam Vitals and nursing note reviewed.  Constitutional:      General: He is not in acute distress.    Appearance: He is ill-appearing.  Cardiovascular:     Rate and Rhythm: Normal rate.  Pulmonary:     Effort: Pulmonary effort is normal.  Neurological:     Mental Status: He is alert and oriented to person, place, and time.  Psychiatric:        Mood and Affect: Mood normal.        Behavior: Behavior normal.    Vital Signs: BP (!) 146/106 (BP Location: Left Arm)   Pulse 100   Temp (!) 97.3 F (36.3 C) (Oral)   Resp 19   Ht 5\' 11"  (1.803 m)   Wt 72.6 kg   SpO2 95%   BMI 22.32 kg/m  Pain Scale: 0-10   Pain Score: 5    SpO2: SpO2: 95 % O2 Device:SpO2: 95 % O2 Flow Rate: .    Palliative Assessment/Data: TBD     MDM: High    Nashton Belson Jeni Salles, PA-C  Palliative Medicine Team Team phone # [phone]  Thank you for allowing the Palliative Medicine Team to assist in the care of this patient. Please utilize secure chat with additional questions, if there is no response within 30 minutes please call the above phone number.  Palliative Medicine Team providers are available by phone from 7am to 7pm daily and can be reached through the team cell phone.  Should this patient require assistance outside of these hours, please call the patient's attending physician.

## 2022-10-08 NOTE — Assessment & Plan Note (Signed)
-   Continue home Crestor °

## 2022-10-08 NOTE — Procedures (Signed)
PROCEDURE SUMMARY:  Successful image-guided paracentesis from the rught lower abdomen.  Yielded 6.1 liters of cream colored fluid.  No immediate complications.  EBL = trace. Patient tolerated well.   Specimen was  sent for labs.  Please see imaging section of Epic for full dictation.   Lynann Bologna Nyeshia Mysliwiec PA-C 10/08/2022 2:54 PM

## 2022-10-08 NOTE — Assessment & Plan Note (Signed)
-  Continue home amlodipine 

## 2022-10-08 NOTE — ED Notes (Signed)
The first [facility] truck for pt arrived on scene in the [facility] ED EMS bay and encountered truck issues. [facility] states that they cannot transport the pt in this truck as it will likely have to be towed or someone will have to come and fix it as it is currently cannot by driven--MD made aware

## 2022-10-08 NOTE — Assessment & Plan Note (Addendum)
Appears to have disease progression and outpatient planning to change medications.  CT abdomen was done which is concerning for large pleural effusion, left more than right and large volume ascites.  There was also concern of lymphangitic spread of tumor in the lung. Appears to have poor prognosis. -Ordered CT chest -Message sent to his oncologist Dr. Ellin Saba. -Palliative care consult

## 2022-10-08 NOTE — ED Notes (Signed)
Signature pad unavailable for consent of transfer for procedure

## 2022-10-08 NOTE — ED Notes (Signed)
Patient transported to CT 

## 2022-10-08 NOTE — Progress Notes (Signed)
ASSUMPTION OF CARE NOTE   10/08/2022 4:15 PM  Jacob Reynolds was seen and examined.  The H&P by the admitting provider, orders, imaging was reviewed.  Please see new orders.  Will continue to follow.    Pt was admitted this morning by Dr. Nelson [facility] to [facility] under observation.  Unfortunately [facility] does not have ability to do paracentesis or thoracentesis procedures over the weekend.  Pt has massive ascites that cannot wait 2-3 days for treatment.  I have spent all morning working with the [facility] to get the patient transferred to [facility] to have the procedures done.  I have spoken with IR and they kindly agreed to stay longer today and wait for patient to get there to do the paracentesis today.  He can't have both procedures done on the same day.  Hopefully he will be able to get a palliative thoracentesis done tomorrow.  He is still holding in ED at [facility] and no beds available at [facility].  The [facility] requested that patient remain at [facility] after procedure because they have beds available and to offload ED at [facility] (multiple patients holding for beds at [facility] in ED).  Transfer order placed and signed out to Dr. Jerral [facility] at [facility].    Vitals:   10/08/22 1540 10/08/22 1548  BP: (!) 143/107 (!) 142/107  Pulse:    Resp:    Temp:    SpO2:      Results for orders placed or performed during the hospital encounter of 10/08/22  Lipase, blood  Result Value Ref Range   Lipase 25 11 - 51 U/L  Comprehensive metabolic panel  Result Value Ref Range   Sodium 129 (L) 135 - 145 mmol/L   Potassium 4.9 3.5 - 5.1 mmol/L   Chloride 98 98 - 111 mmol/L   CO2 21 (L) 22 - 32 mmol/L   Glucose, Bld 129 (H) 70 - 99 mg/dL   BUN 19 6 - 20 mg/dL   Creatinine, Ser 0.98 0.61 - 1.24 mg/dL   Calcium 8.7 (L) 8.9 - 10.3 mg/dL   Total Protein 7.3 6.5 - 8.1 g/dL   Albumin 2.5 (L) 3.5 - 5.0 g/dL   AST 40 15 - 41 U/L   ALT 38 0 - 44 U/L   Alkaline Phosphatase 119 38 - 126 U/L   Total Bilirubin 0.7 0.3 - 1.2 mg/dL   GFR, Estimated >11 >91  mL/min   Anion gap 10 5 - 15  CBC  Result Value Ref Range   WBC 6.1 4.0 - 10.5 K/uL   RBC 5.38 4.22 - 5.81 MIL/uL   Hemoglobin 14.6 13.0 - 17.0 g/dL   HCT 47.8 29.5 - 62.1 %   MCV 80.9 80.0 - 100.0 fL   MCH 27.1 26.0 - 34.0 pg   MCHC 33.6 30.0 - 36.0 g/dL   RDW 30.8 (H) 65.7 - 84.6 %   Platelets 221 150 - 400 K/uL   nRBC 0.0 0.0 - 0.2 %  Urinalysis, Routine w reflex microscopic -Urine, Clean Catch  Result Value Ref Range   Color, Urine AMBER (A) YELLOW   APPearance CLOUDY (A) CLEAR   Specific Gravity, Urine 1.025 1.005 - 1.030   pH 6.0 5.0 - 8.0   Glucose, UA NEGATIVE NEGATIVE mg/dL   Hgb urine dipstick NEGATIVE NEGATIVE   Bilirubin Urine NEGATIVE NEGATIVE   Ketones, ur 5 (A) NEGATIVE mg/dL   Protein, ur 962 (A) NEGATIVE mg/dL   Nitrite NEGATIVE NEGATIVE   Leukocytes,Ua NEGATIVE  NEGATIVE   RBC / HPF 6-10 0 - 5 RBC/hpf   WBC, UA 21-50 0 - 5 WBC/hpf   Bacteria, UA NONE SEEN NONE SEEN   Squamous Epithelial / HPF 0-5 0 - 5 /HPF   Mucus PRESENT    Ca Oxalate Crys, UA PRESENT   Magnesium  Result Value Ref Range   Magnesium 1.9 1.7 - 2.4 mg/dL  HIV Antibody (routine testing w rflx)  Result Value Ref Range   HIV Screen 4th Generation wRfx Non Reactive Non Reactive   Prolonged service time spent: 45 mins  Jacob Manuel, MD [facility]   10/08/2022  2:06 AM How to contact the [facility] Attending or Consulting provider 7A - 7P or covering provider during after hours 7P -7A, for this patient?  Check the care team in [facility] and look for a) attending/consulting [facility] provider listed and b) the [facility] team listed Log into [email or web address] and use [place]'s universal password to access. If you do not have the password, please contact the hospital operator. Locate the [facility] provider you are looking for under [facility] and page to a number that you can be directly reached. If you still have difficulty reaching the provider, please page the [facility] (Director on Call) for the Hospitalists  listed on amion for assistance.

## 2022-10-08 NOTE — ED Notes (Signed)
Hospitalist at bedside 

## 2022-10-08 NOTE — ED Notes (Signed)
[facility] at bedside to transport pt to [facility] for paracentesis

## 2022-10-08 NOTE — Progress Notes (Signed)
Pharmacy does not carry ordered DPI. Substitute will be administered once it is available.

## 2022-10-08 NOTE — ED Provider Notes (Signed)
AP-EMERGENCY DEPT [facility] Emergency Department Provider Note MRN:  [id number]  Arrival date & time: 10/08/22     Chief Complaint   Abdominal Pain, Back Pain, Nausea, and Emesis   History of Present Illness   Jacob Reynolds is a 57 y.o. year-old male with a history of renal cell carcinoma presenting to the ED with chief complaint of abdominal pain.  Patient has metastatic kidney cancer.  Having increased abdominal bloating recently, abdominal pain, persistent nausea vomiting this evening.  Also not having bowel movements or passing gas today.  Review of Systems  A thorough review of systems was obtained and all systems are negative except as noted in the HPI and PMH.   Patient's Health History    Past Medical History:  Diagnosis Date   Cancer (HCC)    Plantar fasciitis    Port-A-Cath in place 11/10/2021    Past Surgical History:  Procedure Laterality Date   IR IMAGING GUIDED PORT INSERTION  10/15/2021    Family History  Problem Relation Age of Onset   Hypertension Mother    Parkinson's disease Father    Breast cancer Maternal Aunt        dx >50   Lung cancer Paternal Grandfather    Breast cancer Cousin        dx 26s   Prostate cancer Cousin        dx 15s    Social History   Socioeconomic History   Marital status: Single    Spouse name: Not on file   Number of children: 1   Years of education: 11   Highest education level: 11th grade  Occupational History   Not on file  Tobacco Use   Smoking status: Every Day    Packs/day: 1.00    Years: 36.00    Additional pack years: 0.00    Total pack years: 36.00    Types: Cigarettes    Start date: 06/13/1978   Smokeless tobacco: Never  Vaping Use   Vaping Use: Never used  Substance and Sexual Activity   Alcohol use: Yes    Alcohol/week: 7.0 standard drinks of alcohol    Types: 7 Cans of beer per week   Drug use: Yes    Frequency: 1.0 times per week    Types: Marijuana   Sexual activity: Not on file   Other Topics Concern   Not on file  Social History Narrative   Not on file   Social Determinants of Health   Financial Resource Strain: Not on file  Food Insecurity: Not on file  Transportation Needs: Not on file  Physical Activity: Not on file  Stress: Not on file  Social Connections: Not on file  Intimate Partner Violence: Not on file     Physical Exam   Vitals:   10/08/22 0210 10/08/22 0410  BP: (!) 166/125 (!) 164/124  Pulse: 94 99  Resp: (!) 21 18  Temp: 97.9 F (36.6 C)   SpO2: 97% 96%    CONSTITUTIONAL: Chronically ill-appearing, NAD NEURO/PSYCH:  Alert and oriented x 3, no focal deficits EYES:  eyes equal and reactive ENT/NECK:  no LAD, no JVD CARDIO: Regular rate, well-perfused, normal S1 and S2 PULM:  CTAB no wheezing or rhonchi GI/GU: Prominently distended abdomen, mildly tender MSK/SPINE:  No gross deformities, no edema SKIN:  no rash, atraumatic   *Additional and/or pertinent findings included in MDM below  Diagnostic and Interventional Summary    EKG Interpretation  Date/Time:    Ventricular  Rate:    PR Interval:    QRS Duration:   QT Interval:    QTC Calculation:   R Axis:     Text Interpretation:         Labs Reviewed  COMPREHENSIVE METABOLIC PANEL - Abnormal; Notable for the following components:      Result Value   Sodium 129 (*)    CO2 21 (*)    Glucose, Bld 129 (*)    Calcium 8.7 (*)    Albumin 2.5 (*)    All other components within normal limits  CBC - Abnormal; Notable for the following components:   RDW 24.8 (*)    All other components within normal limits  LIPASE, BLOOD  MAGNESIUM  URINALYSIS, ROUTINE W REFLEX MICROSCOPIC    CT ABDOMEN PELVIS WO CONTRAST  Final Result      Medications  lactated ringers bolus 1,000 mL (1,000 mLs Intravenous New Bag/Given 10/08/22 0257)  HYDROmorphone (DILAUDID) injection 1 mg (1 mg Intravenous Given 10/08/22 0256)  LORazepam (ATIVAN) injection 1 mg (1 mg Intravenous Given 10/08/22  0408)     Procedures  /  Critical Care Procedures  ED Course and Medical Decision Making  Initial Impression and Ddx Concern for bowel obstruction related to metastatic cancer.  Past medical/surgical history that increases complexity of ED encounter: Metastatic renal cancer  Interpretation of Diagnostics I personally reviewed the EKG and my interpretation is as follows: Prolonged QT, sinus rhythm  Hyponatremia, otherwise no significant blood count or electrolyte disturbance CT imaging revealing large volume ascites but no obstruction  Patient Reassessment and Ultimate Disposition/Management     Continued nausea vomiting and pain, will admit for symptom control, question need for large-volume paracentesis, further goals of care discussion.  Patient management required discussion with the following services or consulting groups:  Hospitalist Service  Complexity of Problems Addressed Acute illness or injury that poses threat of life of bodily function  Additional Data Reviewed and Analyzed Further history obtained from: Prior labs/imaging results  Additional Factors Impacting ED Encounter Risk Use of parenteral controlled substances and Consideration of hospitalization  Elmer Sow. Pilar Plate, MD [facility] Emergency Medicine [facility] [email or web address]  Final Clinical Impressions(s) / ED Diagnoses     ICD-10-CM   1. Generalized abdominal pain  R10.84     2. Other ascites  R18.8       ED Discharge Orders     None        Discharge Instructions Discussed with and Provided to Patient:   Discharge Instructions   None      Sabas Sous, MD 10/08/22 [phone]

## 2022-10-08 NOTE — Assessment & Plan Note (Signed)
CT abdomen with worsening large volume ascites which is contributory to his pain and discomfort.  Due to metastatic disease -US guided paracentesis ordered. -Albumin ordered to be given after paracentesis

## 2022-10-08 NOTE — Assessment & Plan Note (Signed)
Unable to tolerate p.o. for the past 1 week which daily get worse yesterday.  Patient was recently started on some cancer medication which worsen his nausea and vomiting and it was stopped. Apparently Ativan helped more than Zofran. -Admit to MedSurg under observation -Ordered Zofran, Phenergan and Ativan as needed -Clear liquid diet at patient's request -LR at 75 mill per hour until started taking good p.o.

## 2022-10-09 ENCOUNTER — Inpatient Hospital Stay (HOSPITAL_COMMUNITY): Payer: 59

## 2022-10-09 DIAGNOSIS — G893 Neoplasm related pain (acute) (chronic): Secondary | ICD-10-CM | POA: Diagnosis not present

## 2022-10-09 DIAGNOSIS — E871 Hypo-osmolality and hyponatremia: Secondary | ICD-10-CM | POA: Diagnosis not present

## 2022-10-09 DIAGNOSIS — R112 Nausea with vomiting, unspecified: Secondary | ICD-10-CM | POA: Diagnosis not present

## 2022-10-09 DIAGNOSIS — R180 Malignant ascites: Secondary | ICD-10-CM | POA: Diagnosis not present

## 2022-10-09 DIAGNOSIS — C7951 Secondary malignant neoplasm of bone: Secondary | ICD-10-CM | POA: Diagnosis not present

## 2022-10-09 LAB — COMPREHENSIVE METABOLIC PANEL
ALT: 24 U/L (ref 0–44)
AST: 27 U/L (ref 15–41)
Albumin: 2.2 g/dL — ABNORMAL LOW (ref 3.5–5.0)
Alkaline Phosphatase: 86 U/L (ref 38–126)
Anion gap: 11 (ref 5–15)
BUN: 15 mg/dL (ref 6–20)
CO2: 22 mmol/L (ref 22–32)
Calcium: 8 mg/dL — ABNORMAL LOW (ref 8.9–10.3)
Chloride: 95 mmol/L — ABNORMAL LOW (ref 98–111)
Creatinine, Ser: 1.14 mg/dL (ref 0.61–1.24)
GFR, Estimated: >60 mL/min (ref >60)
Glucose, Bld: 92 mg/dL (ref 70–99)
Potassium: 4 mmol/L (ref 3.5–5.1)
Sodium: 128 mmol/L — ABNORMAL LOW (ref 135–145)
Total Bilirubin: 0.9 mg/dL (ref 0.3–1.2)
Total Protein: 5.8 g/dL — ABNORMAL LOW (ref 6.5–8.1)

## 2022-10-09 LAB — BODY FLUID CELL COUNT WITH DIFFERENTIAL
Eos, Fluid: 0 %
Lymphs, Fluid: 20 %
Monocyte-Macrophage-Serous Fluid: 67 % (ref 50–90)
Neutrophil Count, Fluid: 13 % (ref 0–25)
Total Nucleated Cell Count, Fluid: 2229 cu mm — ABNORMAL HIGH (ref 0–1000)

## 2022-10-09 LAB — CBC
HCT: 39.1 % (ref 39.0–52.0)
Hemoglobin: 13.3 g/dL (ref 13.0–17.0)
MCH: 27.4 pg (ref 26.0–34.0)
MCHC: 34 g/dL (ref 30.0–36.0)
MCV: 80.5 fL (ref 80.0–100.0)
Platelets: 200 10*3/uL (ref 150–400)
RBC: 4.86 MIL/uL (ref 4.22–5.81)
RDW: 25 % — ABNORMAL HIGH (ref 11.5–15.5)
WBC: 6.3 10*3/uL (ref 4.0–10.5)
nRBC: 0 % (ref 0.0–0.2)

## 2022-10-09 LAB — BODY FLUID CULTURE W GRAM STAIN

## 2022-10-09 LAB — LACTATE DEHYDROGENASE, PLEURAL OR PERITONEAL FLUID: LD, Fluid: 628 U/L — ABNORMAL HIGH (ref 3–23)

## 2022-10-09 LAB — PROTIME-INR
INR: 1.2 (ref 0.8–1.2)
Prothrombin Time: 15.2 seconds (ref 11.4–15.2)

## 2022-10-09 LAB — ALBUMIN, PLEURAL OR PERITONEAL FLUID: Albumin, Fluid: 2.2 g/dL

## 2022-10-09 MED ORDER — LIDOCAINE HCL (PF) 1 % IJ SOLN
5.0000 mL | Freq: Once | INTRAMUSCULAR | Status: AC
  Administered 2022-10-09: 5 mL via INTRADERMAL

## 2022-10-09 MED ORDER — LIDOCAINE 5 % EX PTCH
1.0000 | MEDICATED_PATCH | CUTANEOUS | Status: DC
  Administered 2022-10-09: 1 via TRANSDERMAL
  Filled 2022-10-09: qty 1

## 2022-10-09 NOTE — Progress Notes (Signed)
MEWS Progress Note  Patient Details Name: Jacob Reynolds MRN: [id number] DOB: June 04, 1966 Today's Date: 10/09/2022   MEWS Flowsheet Documentation:  Assess: MEWS Score Temp: 98 F (36.7 C) BP: (!) 124/102 MAP (mmHg): 109 Pulse Rate: (!) 114 ECG Heart Rate: (!) 101 Resp: 16 Level of Consciousness: Alert SpO2: 98 % O2 Device: Room Air Assess: MEWS Score MEWS Temp: 0 MEWS Systolic: 0 MEWS Pulse: 2 MEWS RR: 0 MEWS LOC: 0 MEWS Score: 2 MEWS Score Color: Yellow Assess: SIRS CRITERIA SIRS Temperature : 0 SIRS Respirations : 0 SIRS Pulse: 1 SIRS WBC: 0 SIRS Score Sum : 1 Assess: if the MEWS score is Yellow or Red Were vital signs taken at a resting state?: Yes Focused Assessment: No change from prior assessment Does the patient meet 2 or more of the SIRS criteria?: No MEWS guidelines implemented : Yes, yellow Treat MEWS Interventions: Considered administering scheduled or prn medications/treatments as ordered Take Vital Signs Increase Vital Sign Frequency : Yellow: Q2hr x1, continue Q4hrs until patient remains green for 12hrs Escalate MEWS: Escalate: Yellow: Discuss with charge nurse and consider notifying provider and/or RRT        Baldwin Jamaica 10/09/2022, 9:10 PM

## 2022-10-09 NOTE — Procedures (Signed)
PROCEDURE SUMMARY:  Successful image-guided left thoracentesis. Yielded 1.6 L of cram colored fluid. Pt tolerated procedure well. No immediate complications. EBL = trace   Specimen was sent for labs. CXR ordered.  Please see imaging section of Epic for full dictation.  Lynann Bologna Chamya Hunton PA-C 10/09/2022 10:52 AM

## 2022-10-09 NOTE — Progress Notes (Signed)
[facility] Progress Note  Patient: Jacob Reynolds    NWG:[id number]  DOA: 10/08/2022    Date of Service: the patient was seen and examined on 10/09/2022  Brief hospital course: 57 year old male with past medical history of stage IV renal cancer with bone metastases and hypertension who presented to the emergency room at [facility] in [place] on the early morning hours of 4/27 with intractable nausea, vomiting and abdominal pain that been going on for several days.  Due to cancer progression, patient had had his chemo medications adjusted (with a change in his cabozantinib from 60 mg to 40 mg, starting 40 mg on 4/17.)  Patient presented to the ED with significant abdominal distention and found to have large amount ascites with concerns of lymphangitis spread of tumor as well as large left-sided pleural effusion.  (Patient not hypoxic.)  Patient admitted to the hospitalist service that morning and paracentesis and thoracentesis ordered.  Unfortunately, the services not available at [facility] weekend and patient transferred to [facility] on afternoon of 4/27.  Following arrival, patient underwent ultrasound-guided paracentesis at [facility] and 6.1 L of fluid were removed.  Palliative care consulted and at this time, patient would like to continue doing everything although understanding his terminal condition.   Assessment and Plan: Malignant ascites with secondary abdominal pain causing nausea and vomiting: Status post 6 L removed.  IV albumin given after.  Patient reports feeling better afterwards.  Currently pain controlled.  He had been placed on clear liquids and diet being advanced to regular diet as per patient's request.  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  IR consulted for thoracentesis as well.  Patient not hypoxic at this time.  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  Patient has completed radiation therapy.   Chemotherapy medications adjusted accordingly.  CT scan of chest confirms progression of disease with concerns for lymphangitic spread.  Message sent to patient's oncologist.  He has a follow-up appointment with the patient on Tuesday.  Essential hypertension:  Hyperlipidemia: Continue Crestor.  Hyponatremia: In part due to poor p.o. intake and dehydration.  Sodium at 128 today.    Body mass index is 22.35 kg/m.        Consultants: Interventional radiology Palliative care  Procedures: Status post paracentesis  Antimicrobials: None  Code Status: Full code   Subjective: Patient states feeling better, nausea and pain controlled currently  Objective: Vital signs were reviewed and unremarkable. Vitals:   10/08/22 2310 10/09/22 0356  BP: (!) 120/91 110/86  Pulse: 94 93  Resp: 17 18  Temp: (!) 97.5 F (36.4 C) 97.9 F (36.6 C)  SpO2: 91% 92%    Intake/Output Summary (Last 24 hours) at 10/09/2022 0942 Last data filed at 10/08/2022 2342 Gross per 24 hour  Intake 120 ml  Output --  Net 120 ml   Filed Weights   10/08/22 0215 10/09/22 0423  Weight: 72.6 kg 72.7 kg   Body mass index is 22.35 kg/m.  Exam:  General: Alert and oriented x 3, MAC HEENT: Normocephalic, atraumatic, mucous membranes are moist Cardiovascular: Regular rate and rhythm, S1-S2 Respiratory: Clear to auscultation bilaterally, decreased breath sounds on the left greater than right Abdomen: Soft, nontender, minimal distention, normal active bowel sounds Musculoskeletal: No clubbing or cyanosis, trace pitting edema Skin: No skin breaks, tears or lesions Psychiatry: Appropriate, no evidence of psychoses Neurology: No focal deficits  Data Reviewed: Sodium of 128, creatinine of 1.14  Disposition:  Status is: Inpatient Remains inpatient appropriate because:  -Thoracentesis -Ensure patient able to take p.o.    Anticipated discharge date: 4/29  Family Communication: Will call patient's  nephew DVT Prophylaxis: enoxaparin (LOVENOX) injection 40 mg Start: 10/08/22 2200    Author: Hollice Espy ,MD 10/09/2022 9:42 AM  To reach On-call, see care teams to locate the attending and reach out via [email or web address]. Between 7PM-7AM, please contact night-coverage If you still have difficulty reaching the attending provider, please page the [facility] (Director on Call) for [facility] on amion for assistance.

## 2022-10-09 NOTE — Progress Notes (Signed)
Daily Progress Note   Patient Name: Jacob Reynolds       Date: 10/09/2022 DOB: August 25, 1965  Age: 57 y.o. MRN#: [id number] Attending Physician: Hollice Espy, MD Primary Care Physician: [name], FNP Admit Date: 10/08/2022  Reason for Consultation/Follow-up: Establishing goals of care  Subjective: Medical records reviewed including progress notes, labs, and imaging. Patient assessed at the bedside. He reports feeling better today, though still with 4/10 abdominal pain and 5/10 back pain. He states breakfast was tolerable and he craving solid foods. Lunch arrived during my visit. No family present. He expects a visitor soon and does not want to request PRN dilaudid, for fear of being too sleepy for interaction.  Created space and opportunity for patient's thoughts and feelings on his current illness. He is "taking it one day at a time" and trying to figure things out. He has been reflecting on his priorities and in what circumstances he would want to discuss a new care plan or focus on his care. Encouraged him to share with the medical team and his family, as he will be supported throughout his illness and difficult decision-making. Outpatient palliative care was explained and offered. Patient is agreeable. He states that he has already completed advanced directives.  Patient also hopes for assistance with financial programs that cover unpaid medical expenses over $5000. Offered to reach out to [facility] for guidance and he is appreciative. Patient would also like advocacy for his primary attending to return to the bedside. I reviewed his lab work with him and updated MD on his request. Also messaged his oncologist at his request. Patient states his desire for "fast-tracking" of his next cancer  treatment, while also mentioning he is considering stopping cancer treatment. His goal is to get another few months of life and he is unsure if this next medication would be a last line of treatment.  Questions and concerns addressed. PMT will continue to support holistically.   Length of Stay: 1  Physical Exam Vitals and nursing note reviewed.  Constitutional:      General: He is not in acute distress.    Appearance: He is ill-appearing.  Pulmonary:     Effort: Pulmonary effort is normal. No respiratory distress.  Skin:    General: Skin is warm and dry.  Neurological:  Mental Status: He is alert and oriented to person, place, and time.  Psychiatric:        Behavior: Behavior is cooperative.    Vital Signs: BP 112/83   Pulse 93   Temp 97.9 F (36.6 C) (Oral)   Resp 18   Ht 5\' 11"  (1.803 m)   Wt 72.7 kg   SpO2 92%   BMI 22.35 kg/m  SpO2: SpO2: 92 % O2 Device: O2 Device: Room Air O2 Flow Rate:        Palliative Care Assessment & Plan   Patient Profile: 57 y.o. male  with past medical history of stage IV renal cancer and hypertension admitted on 10/08/2022 with nausea, vomiting, abdominal pain.    Patient is in the process of changing medications as he continued to have disease progression on the prior regimen. Addition of a new medicine causes nausea and vomiting which continued to get worse despite stopping that medicine, to the point that he was unable to tolerate any p.o. intake over the past few days.  Also having worsening abdominal distention and pain.  He has back pain due to metastatic disease.    PMT has been consulted to assist with goals of care conversation.  Assessment: Goals of care conversation Stage 4 renal cancer with mets to the bone, lungs Malignant ascites s/p paracentesis Hyponatremia  Recommendations/Plan: Continue full code/full scope treatment TOC consulted for financial assistance programs to review with patient, appreciate their  guidance Placed referral to outpatient palliative care clinic at [facility] Patient is interested in ongoing cancer treatments at this time, starting to consider when he would desire to stop in the future Psychosocial and emotional support provided PMT remains available as needed. Patient encouraged to call team line for additional needs   Prognosis: Poor long-term prognosis given disease progression and stage IV metastatic cancer, nutritional decline. Hospice appropriate when aligned with GOC   Discharge Planning: Home with Palliative Services  Care plan was discussed with patient    MDM High         Jodelle Fausto Jeni Salles, PA-C  Palliative Medicine Team Team phone # [phone]  Thank you for allowing the Palliative Medicine Team to assist in the care of this patient. Please utilize secure chat with additional questions, if there is no response within 30 minutes please call the above phone number.  Palliative Medicine Team providers are available by phone from 7am to 7pm daily and can be reached through the team cell phone.  Should this patient require assistance outside of these hours, please call the patient's attending physician.

## 2022-10-10 ENCOUNTER — Telehealth: Payer: Self-pay

## 2022-10-10 ENCOUNTER — Other Ambulatory Visit (HOSPITAL_COMMUNITY): Payer: Self-pay

## 2022-10-10 ENCOUNTER — Other Ambulatory Visit: Payer: Self-pay | Admitting: Hematology

## 2022-10-10 ENCOUNTER — Other Ambulatory Visit: Payer: Self-pay

## 2022-10-10 DIAGNOSIS — C649 Malignant neoplasm of unspecified kidney, except renal pelvis: Secondary | ICD-10-CM

## 2022-10-10 DIAGNOSIS — C7951 Secondary malignant neoplasm of bone: Secondary | ICD-10-CM | POA: Diagnosis not present

## 2022-10-10 DIAGNOSIS — R112 Nausea with vomiting, unspecified: Secondary | ICD-10-CM | POA: Diagnosis not present

## 2022-10-10 DIAGNOSIS — R180 Malignant ascites: Secondary | ICD-10-CM | POA: Diagnosis not present

## 2022-10-10 DIAGNOSIS — E871 Hypo-osmolality and hyponatremia: Secondary | ICD-10-CM | POA: Diagnosis not present

## 2022-10-10 LAB — BASIC METABOLIC PANEL
Anion gap: 12 (ref 5–15)
BUN: 21 mg/dL — ABNORMAL HIGH (ref 6–20)
CO2: 22 mmol/L (ref 22–32)
Calcium: 8.2 mg/dL — ABNORMAL LOW (ref 8.9–10.3)
Chloride: 96 mmol/L — ABNORMAL LOW (ref 98–111)
Creatinine, Ser: 1.23 mg/dL (ref 0.61–1.24)
GFR, Estimated: >60 mL/min (ref >60)
Glucose, Bld: 130 mg/dL — ABNORMAL HIGH (ref 70–99)
Potassium: 4.5 mmol/L (ref 3.5–5.1)
Sodium: 130 mmol/L — ABNORMAL LOW (ref 135–145)

## 2022-10-10 LAB — PATHOLOGIST SMEAR REVIEW

## 2022-10-10 LAB — CYTOLOGY - NON PAP

## 2022-10-10 LAB — BODY FLUID CULTURE W GRAM STAIN: Culture: NO GROWTH

## 2022-10-10 MED ORDER — BELZUTIFAN 40 MG PO TABS
120.0000 mg | ORAL_TABLET | Freq: Every day | ORAL | 0 refills | Status: DC
Start: 2022-10-10 — End: 2022-10-10
  Filled 2022-10-10: qty 90, 30d supply, fill #0

## 2022-10-10 MED ORDER — BELZUTIFAN 40 MG PO TABS
120.0000 mg | ORAL_TABLET | Freq: Every day | ORAL | 0 refills | Status: DC
Start: 2022-10-10 — End: 2022-10-30

## 2022-10-10 MED ORDER — BELZUTIFAN 40 MG PO TABS: 120.0000 mg | ORAL_TABLET | Freq: Every day | ORAL | 0 refills | Status: DC

## 2022-10-10 NOTE — Telephone Encounter (Signed)
Oral Oncology Pharmacist Encounter  Received new prescription for [facility] for the treatment of metastatic kidney cancer, planned duration until disease progression or unacceptable toxicity.  Labs from 10/10/22 assessed, potassium has slightly increased and sodium has decreased. Potassium will be monitored as Annett Fabian can cause increased potassium levels. Of note, patient is currently inpatient and sodium levels are being monitored while admitted. Prescription dose and frequency assessed for appropriateness.  Current medication list in Epic reviewed, no DDIs with prior to admission medications and Welireg identified. Furthermore, no DDIs with current inpatient medications and Welireg identified.   Evaluated chart and no patient barriers to medication adherence noted.   Patient agreement for treatment documented in MD note on 10/04/22.  Prescription has been e-scribed to the [facility] for benefits analysis and approval.  Oral Oncology Clinic will continue to follow for insurance authorization, copayment issues, initial counseling and start date.  Lennie Muckle, PharmD Candidate 10/10/2022 9:19 AM Oral Oncology Clinic [phone]

## 2022-10-10 NOTE — Progress Notes (Signed)
Malen Gauze to be D/C'd  per MD order.  Discussed with the patient and all questions fully answered.  VSS, Skin clean, dry and intact without evidence of skin break down, no evidence of skin tears noted.  IV catheter discontinued intact. Site without signs and symptoms of complications. Dressing and pressure applied.  An After Visit Summary was printed and given to the patient.   D/c education completed with patient/family including follow up instructions, medication list, d/c activities limitations if indicated, with other d/c instructions as indicated by MD - patient able to verbalize understanding, all questions fully answered.   Patient instructed to return to ED, call 911, or call MD for any changes in condition.   Patient to be escorted via WC, and D/C home via private auto.

## 2022-10-10 NOTE — Progress Notes (Signed)
[facility] [address] [place] [address]    Clinic Day:  10/11/2022  Referring physician: [name], FNP  Patient Care Team: [name], FNP as PCP - General (Family Medicine) Therese Sarah, RN as Oncology Nurse Navigator (Medical Oncology) Doreatha Massed, MD as Medical Oncologist (Medical Oncology)   ASSESSMENT & PLAN:   Assessment: Metastatic kidney cancer to the bones: - CT chest lung cancer screening scan (08/31/2021): Lung RADS 1S.  Possible soft tissue fullness in the upper pole of the left kidney. - US renal (09/15/2021): Mass with solid component in the upper pole of the left kidney measuring 6.1 cm, highly suspicious for RCC. - MRI of the abdomen (09/22/2021): 6.5 cm left kidney upper pole solid enhancing renal mass favoring RCC.  Scattered enhancing bone lesions suspicious for osseous metastatic disease, largest at L3 and in the central sacrum.  1.3 x 1.4 cm mass of the left adrenal gland has indeterminate characteristics by MRI but on CT chest seems to have a low-density favoring adenoma.  No tumor thrombus in the left renal vein. - PET scan (10/07/2021): Hypermetabolic left kidney mass with SUV 7.1.  Bilateral abdominal retroperitoneal hypermetabolic lymph nodes.  Multifocal bone metastasis.  Central sacrum, inferior aspect of L3 vertebral body, T8 body, C7 spinous process and medial right clavicle. 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  Biopsy shows a poorly differentiated neoplasm, IHC positive for CK8/18 (subset), CD10 and cytokeratin AE 1/3.  Cells are negative for CK20, S100, HMB45, SOX10, calretinin, P504S, prostein, PSA, desmin and CDX2. - I have talked to our pathologist Dr. Charm Barges who thought it is most likely clear-cell histology although IHC does not support it. - IMDC criteria: 1 risk factor with less than 1 year from time of diagnosis to systemic  therapy.  Intermediate risk group - Opdivo and Yervoy cycle 1 started on 11/17/2021.  Discontinued on 08/01/2022 due to progression. 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  Jacob Reynolds is a potential candidate for belzutifan on further progression. - Cabozantinib 60 mg daily started on 08/03/2022, dose decreased to 40 mg daily started on 09/28/2022, discontinued on 10/04/2022 - Germline mutation testing is negative. - Belzutifan 120 mg daily started on   2. Social/family history: - His nephew lives with him.  Jacob Reynolds has a 67-year-old daughter who was given for adoption.  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  Jacob Reynolds is a current active smoker, 1 pack/day for close to 40 years.  Jacob Reynolds drinks 1 beer at night. - Maternal aunt had breast cancer.  Maternal cousin has prostate cancer and another maternal cousin had breast cancer.  Maternal cousin also had kidney removed, thought to be secondary to cancer.  Paternal grandfather had lung cancer.  His sister reportedly diagnosed with stage IV kidney cancer.    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 - I reviewed CT CAP without contrast from hospitalization which showed progressive disease and worsening left pleural effusion and ascites. - Jacob Reynolds had a paracentesis with 6.1 L of fluid removed and a left thoracentesis with 1.6 L of fluid removed.  Both were cytologically positive for malignancy. - Jacob Reynolds is gradually eating more solid foods since discharge  from the hospital. - I have recommended switching treatment at this time. - We discussed the next best option being belzutifan 120 mg daily.  I have coded response rates around 60% with most common side effects being anemia, hypoxia and  fatigue. - We have sent a prescription to specialty pharmacy.  Jacob Reynolds will start taking them once Jacob Reynolds gets the medication. - I will see him back next Thursday with repeat labs.  2.  Upper back pain: - Jacob Reynolds had finished radiation to the T4-T10 level. - Jacob Reynolds reports some pain.  Will give him Dilaudid 2 mg every 6 hours as needed.  3.  Bone metastatic disease: - We will start denosumab after dental extractions done completely.   4.  Weight loss: - I have recommended that Jacob Reynolds maintain a 2000-calorie diet with high-protein.   5.  Hypertension: - Continue amlodipine 10 mg daily.  Blood pressure is 130/93.  No orders of the defined types were placed in this encounter.     I,Katie Daubenspeck,acting as a Neurosurgeon for Doreatha Massed, MD.,have documented all relevant documentation on the behalf of Doreatha Massed, MD,as directed by  Doreatha Massed, MD while in the presence of Doreatha Massed, MD.   I, Doreatha Massed MD, have reviewed the above documentation for accuracy and completeness, and I agree with the above.   Doreatha Massed, MD   4/30/202412:42 PM  CHIEF COMPLAINT:   Diagnosis: metastatic kidney cancer    Cancer Staging  Metastatic renal cell carcinoma to bone [facility]) Staging form: Kidney, AJCC 8th Edition - Clinical stage from 11/04/2021: Stage IV (cT1b, cN1, pM1) - Unsigned    Prior Therapy: 1. Nivolumab + Ipilimumab q21d / Nivolumab q28d  2. Cabozantinib   Current Therapy:  Welireg    HISTORY OF PRESENT ILLNESS:   Oncology History  Metastatic renal cell carcinoma to bone (HCC)  11/04/2021 Initial Diagnosis   Metastatic renal cell carcinoma to bone (HCC)   11/17/2021 - 02/10/2022 Chemotherapy   Patient is on Treatment Plan : RENAL CELL CARCINOMA Nivolumab + Ipilimumab q21d / Nivolumab q28d     11/17/2021 - 07/04/2022 Chemotherapy   Patient is on Treatment Plan : RENAL CELL CARCINOMA Nivolumab (3) + Ipilimumab (1) q21d x 4 cycles  / Nivolumab (480) q28d         INTERVAL HISTORY:   Jacob Reynolds is a 57 y.o. male presenting to clinic today for follow up of metastatic kidney cancer. Jacob Reynolds was last seen by me on 10/04/22.  Since his last visit, Jacob Reynolds presented to the [facility] ED on 10/08/22 with nausea, vomiting, and abdominal pain.  CT A/P showed: incompletely visualized large left-sided pleural effusion with passive atelectasis at left base; small right-sided pleural effusion; new suspected peribronchovascular nodularity in RLL; increased abdominopelvic ascites; decrease in size of left upper pole kidney mass; increased size of left adrenal metastatic lesion.  Chest CT the same day showed: interval progression of disease-- increase in size of left supraclavicular and right paratracheal and subcarinal lymph nodes, new small right pleural effusion, significant increase in volume of left pleural effusion, multiple new tiny lung nodules measuring 2-3 mm, new interlobular septal thickening, new 2.1 cm nodule within anterior left apex, increase in size of left adrenal gland metastasis and in abdominal ascites with peritoneal nodularity; new circumferential wall thickening involving mid and distal esophagus.  Jacob Reynolds was transferred to [facility] to proceed with paracentesis and thoracentesis. Paracentesis the same day yielded 6.1 L of fluid, and cytology confirmed malignant cells consistent with poorly differentiated  carcinoma. Thoracentesis the next day yielded 1.6 L of fluid, and cytology again showed malignant cells consistent with poorly differentiated carcinoma.  Jacob Reynolds was able to be discharged yesterday, 10/09/22. Jacob Reynolds was prescribed Welireg to start in the near future.  Today, Jacob Reynolds states that Jacob Reynolds is doing well overall. His appetite level is at 40%. His energy level is at 10%.  PAST MEDICAL HISTORY:   Past Medical History: Past Medical History:  Diagnosis Date   Cancer (HCC)    Plantar fasciitis    Port-A-Cath in place 11/10/2021    Surgical History: Past Surgical  History:  Procedure Laterality Date   IR IMAGING GUIDED PORT INSERTION  10/15/2021    Social History: Social History   Socioeconomic History   Marital status: Single    Spouse name: Not on file   Number of children: 1   Years of education: 11   Highest education level: 11th grade  Occupational History   Not on file  Tobacco Use   Smoking status: Every Day    Packs/day: 1.00    Years: 36.00    Additional pack years: 0.00    Total pack years: 36.00    Types: Cigarettes    Start date: 06/13/1978   Smokeless tobacco: Never  Vaping Use   Vaping Use: Never used  Substance and Sexual Activity   Alcohol use: Yes    Alcohol/week: 7.0 standard drinks of alcohol    Types: 7 Cans of beer per week   Drug use: Yes    Frequency: 1.0 times per week    Types: Marijuana   Sexual activity: Not on file  Other Topics Concern   Not on file  Social History Narrative   Not on file   Social Determinants of Health   Financial Resource Strain: Not on file  Food Insecurity: Not on file  Transportation Needs: Not on file  Physical Activity: Not on file  Stress: Not on file  Social Connections: Not on file  Intimate Partner Violence: Not on file    Family History: Family History  Problem Relation Age of Onset   Hypertension Mother    Parkinson's disease Father    Breast cancer Maternal Aunt        dx >50   Lung cancer Paternal Grandfather    Breast cancer Cousin        dx 35s   Prostate cancer Cousin        dx 15s    Current Medications:  Current Outpatient Medications:    albuterol (VENTOLIN HFA) 108 (90 Base) MCG/ACT inhaler, Inhale 2 puffs into the lungs every 6 (six) hours as needed for wheezing or shortness of breath., Disp: 18 g, Rfl: 1   amLODipine (NORVASC) 10 MG tablet, Take 1 tablet (10 mg total) by mouth daily., Disp: 30 tablet, Rfl: 6   belzutifan (WELIREG) 40 MG tablet, Take 3 tablets (120 mg total) by mouth daily., Disp: 90 tablet, Rfl: 0    Budeson-Glycopyrrol-Formoterol (BREZTRI AEROSPHERE) 160-9-4.8 MCG/ACT AERO, Inhale 2 puffs into the lungs 2 (two) times daily., Disp: 10.7 g, Rfl: 11   HYDROcodone-acetaminophen (NORCO) 10-325 MG tablet, Take 1 tablet by mouth every 12 (twelve) hours as needed. (Patient taking differently: Take 1 tablet by mouth every 12 (twelve) hours as needed for moderate pain or severe pain.), Disp: 60 tablet, Rfl: 0   loperamide (IMODIUM) 2 MG capsule, Take 1 capsule (2 mg total) by mouth as needed for diarrhea or loose stools., Disp: 30 capsule, Rfl: 0  magnesium oxide (MAG-OX) 400 (240 Mg) MG tablet, Take 1 tablet (400 mg total) by mouth daily., Disp: 60 tablet, Rfl: 2   ondansetron (ZOFRAN-ODT) 8 MG disintegrating tablet, Take 1 tablet (8 mg total) by mouth every 8 (eight) hours as needed for nausea or vomiting., Disp: 60 tablet, Rfl: 3   potassium chloride SA (KLOR-CON M) 20 MEQ tablet, Take 1 tablet (20 mEq total) by mouth 2 (two) times daily., Disp: 30 tablet, Rfl: 2   rosuvastatin (CRESTOR) 5 MG tablet, Take 1 tablet (5 mg total) by mouth daily., Disp: 90 tablet, Rfl: 3 No current facility-administered medications for this visit.  Facility-Administered Medications Ordered in Other Visits:    diphenhydrAMINE (BENADRYL) 50 MG/ML injection, , , ,    famotidine (PEPCID) 20-0.9 MG/50ML-% IVPB, , , ,    Allergies: Allergies  Allergen Reactions   Iodinated Contrast Media Anaphylaxis    Throat felt like closed Was in hospital for allergy and almost died- per patient  Pt verified that it was from CT contrast   Iodine     Throat felt like closed Was in hospital for allergy and almost died- per patient     REVIEW OF SYSTEMS:   Review of Systems  Constitutional:  Negative for chills, fatigue and fever.  HENT:   Negative for lump/mass, mouth sores, nosebleeds, sore throat and trouble swallowing.   Eyes:  Negative for eye problems.  Respiratory:  Positive for shortness of breath. Negative for cough.    Cardiovascular:  Positive for chest pain. Negative for leg swelling and palpitations.  Gastrointestinal:  Negative for abdominal pain, constipation, diarrhea, nausea and vomiting.  Genitourinary:  Negative for bladder incontinence, difficulty urinating, dysuria, frequency, hematuria and nocturia.   Musculoskeletal:  Positive for back pain. Negative for arthralgias, flank pain, myalgias and neck pain.  Skin:  Negative for itching and rash.  Neurological:  Negative for dizziness, headaches and numbness.  Hematological:  Does not bruise/bleed easily.  Psychiatric/Behavioral:  Positive for sleep disturbance. Negative for depression and suicidal ideas. The patient is not nervous/anxious.   All other systems reviewed and are negative.    VITALS:   Blood pressure (!) 131/93, pulse (!) 112, resp. rate 20, height 5\' 11"  (1.803 m), weight 146 lb 3.2 oz (66.3 kg), SpO2 98 %.  Wt Readings from Last 3 Encounters:  10/11/22 146 lb 3.2 oz (66.3 kg)  10/09/22 160 lb 4.4 oz (72.7 kg)  10/04/22 164 lb 3.2 oz (74.5 kg)    Body mass index is 20.39 kg/m.  Performance status (ECOG): 1 - Symptomatic but completely ambulatory  PHYSICAL EXAM:   Physical Exam Vitals and nursing note reviewed. Exam conducted with a chaperone present.  Constitutional:      Appearance: Normal appearance.  Cardiovascular:     Rate and Rhythm: Normal rate and regular rhythm.     Pulses: Normal pulses.     Heart sounds: Normal heart sounds.  Pulmonary:     Effort: Pulmonary effort is normal.     Breath sounds: Normal breath sounds.  Abdominal:     Palpations: Abdomen is soft. There is no hepatomegaly, splenomegaly or mass.     Tenderness: There is no abdominal tenderness.  Musculoskeletal:     Right lower leg: No edema.     Left lower leg: No edema.  Lymphadenopathy:     Cervical: No cervical adenopathy.     Right cervical: No superficial, deep or posterior cervical adenopathy.    Left cervical: No superficial, deep  or posterior cervical adenopathy.     Upper Body:     Right upper body: No supraclavicular or axillary adenopathy.     Left upper body: No supraclavicular or axillary adenopathy.  Neurological:     General: No focal deficit present.     Mental Status: Jacob Reynolds is alert and oriented to person, place, and time.  Psychiatric:        Mood and Affect: Mood normal.        Behavior: Behavior normal.     LABS:      Latest Ref Rng & Units 10/09/2022    2:11 AM 10/08/2022    2:40 AM 10/04/2022    9:22 AM  CBC  WBC 4.0 - 10.5 K/uL 6.3  6.1  5.8   Hemoglobin 13.0 - 17.0 g/dL 25.9  56.3  87.5   Hematocrit 39.0 - 52.0 % 39.1  43.5  42.3   Platelets 150 - 400 K/uL 200  221  231       Latest Ref Rng & Units 10/10/2022    8:48 AM 10/09/2022    2:11 AM 10/08/2022    2:40 AM  CMP  Glucose 70 - 99 mg/dL 643  92  329   BUN 6 - 20 mg/dL 21  15  19    Creatinine 0.61 - 1.24 mg/dL 5.18  8.41  6.60   Sodium 135 - 145 mmol/L 130  128  129   Potassium 3.5 - 5.1 mmol/L 4.5  4.0  4.9   Chloride 98 - 111 mmol/L 96  95  98   CO2 22 - 32 mmol/L 22  22  21    Calcium 8.9 - 10.3 mg/dL 8.2  8.0  8.7   Total Protein 6.5 - 8.1 g/dL  5.8  7.3   Total Bilirubin 0.3 - 1.2 mg/dL  0.9  0.7   Alkaline Phos 38 - 126 U/L  86  119   AST 15 - 41 U/L  27  40   ALT 0 - 44 U/L  24  38      No results found for: "CEA1", "CEA" / No results found for: "CEA1", "CEA" Lab Results  Component Value Date   PSA1 1.3 08/05/2021   No results found for: "YTK160" No results found for: "CAN125"  No results found for: "TOTALPROTELP", "ALBUMINELP", "A1GS", "A2GS", "BETS", "BETA2SER", "GAMS", "MSPIKE", "SPEI" No results found for: "TIBC", "FERRITIN", "IRONPCTSAT" Lab Results  Component Value Date   LDH 144 11/04/2021     STUDIES:   US Paracentesis  Result Date: 10/09/2022 INDICATION: History of stage IV renal cancer presents with abdominal distension. Previous imaging showed large ascites. Request for therapeutic and diagnostic  paracentesis. EXAM: ULTRASOUND GUIDED  PARACENTESIS MEDICATIONS: 10 mL 1% lidocaine COMPLICATIONS: None immediate. PROCEDURE: Informed written consent was obtained from the patient after a discussion of the risks, benefits and alternatives to treatment. A timeout was performed prior to the initiation of the procedure. Initial ultrasound scanning demonstrates a large amount of ascites within the right lower abdominal quadrant. The right lower abdomen was prepped and draped in the usual sterile fashion. 1% lidocaine was used for local anesthesia. Following this, a 19 gauge, 7-cm, Yueh catheter was introduced. An ultrasound image was saved for documentation purposes. The paracentesis was performed. The catheter was removed and a dressing was applied. The patient tolerated the procedure well without immediate post procedural complication. FINDINGS: A total of approximately 6.1 L of cream colored fluid was removed. Samples were sent to the laboratory  as requested by the clinical team. IMPRESSION: Successful ultrasound-guided paracentesis yielding 6.1 liters of peritoneal fluid. Read by: Lawernce Ion, PA-C Electronically Signed   By: Malachy Moan M.D.   On: 10/09/2022 15:54   US THORACENTESIS ASP PLEURAL SPACE W/IMG GUIDE  Result Date: 10/09/2022 INDICATION: Patient with history of stage IV renal cancer presents with shortness of breath. Previous CT showed bilateral pleural effusion left greater than right. Request for therapeutic and diagnostic thoracentesis. EXAM: ULTRASOUND GUIDED LEFT THORACENTESIS MEDICATIONS: 10 mL 1% lidocaine COMPLICATIONS: None immediate. PROCEDURE: An ultrasound guided thoracentesis was thoroughly discussed with the patient and questions answered. The benefits, risks, alternatives and complications were also discussed. The patient understands and wishes to proceed with the procedure. Written consent was obtained. Ultrasound was performed to localize and mark an adequate pocket of fluid in  the left chest. The area was then prepped and draped in the normal sterile fashion. 1% Lidocaine was used for local anesthesia. Under ultrasound guidance a 6 Fr Safe-T-Centesis catheter was introduced. Thoracentesis was performed. The catheter was removed and a dressing applied. FINDINGS: A total of approximately 1.6 L of cream colored fluid was removed. Samples were sent to the laboratory as requested by the clinical team. Post procedure chest X-ray reviewed, negative for pneumothorax. IMPRESSION: Successful ultrasound guided left thoracentesis yielding 1.6 L of pleural fluid. Read by: Lawernce Ion, PA-C Electronically Signed   By: Malachy Moan M.D.   On: 10/09/2022 15:54   DG Chest 1 View  Result Date: 10/09/2022 CLINICAL DATA:  Status post thoracentesis. EXAM: CHEST  1 VIEW COMPARISON:  None Available. FINDINGS: A right Port-A-Cath is in good position. A small left pleural effusion with underlying atelectasis is identified. No pneumothorax after thoracentesis. The cardiomediastinal silhouette is normal. Increased interstitial markings bilaterally. No other acute abnormalities. IMPRESSION: 1. No pneumothorax after thoracentesis. 2. Small left pleural effusion with underlying atelectasis. 3. Increased interstitial markings bilaterally may represent edema. Electronically Signed   By: Gerome Sam III M.D.   On: 10/09/2022 11:28   CT CHEST WO CONTRAST  Result Date: 10/08/2022 CLINICAL DATA:  Evaluate for malignancy. History of stage IV renal cell carcinoma. * Tracking Code: BO * EXAM: CT CHEST WITHOUT CONTRAST TECHNIQUE: Multidetector CT imaging of the chest was performed following the standard protocol without IV contrast. RADIATION DOSE REDUCTION: This exam was performed according to the departmental dose-optimization program which includes automated exposure control, adjustment of the mA and/or kV according to patient size and/or use of iterative reconstruction technique. COMPARISON:  PET-CT from  07/28/2022 CT chest from 07/15/2022. MR thoracic spine 08/30/2022. FINDINGS: Cardiovascular: Heart size is normal. Aortic atherosclerosis and coronary artery calcifications. No pericardial effusion. Mediastinum/Nodes: Thyroid gland and trachea appear normal. There is new circumferential wall thickening involving the mid and distal esophagus. Previous tracer avid left supraclavicular lymph node measures 1.1 cm, image 10/2. Previously 0.8 cm. Previous tracer avid left internal mammary lymph node measures 5 mm, image 86/2. previously 6 mm. The previously reference tracer avid hilar lymph nodes are suboptimally visualized on today's exam reflecting lack of IV contrast material. Right paratracheal lymph node measures 1.6 cm, image 55/2. Formally 0.6 cm. Previous tracer avid subcarinal lymph node measures 1.1 cm, image 60/2. Previously 1 cm. Lungs/Pleura: New small right pleural effusion. Moderate to large left pleural effusion is identified. Previously trace. Interlobular septal thickening is identified within both lungs which has a lower lung zone predominance. There are multiple new, subtle tiny lung nodules identified all measuring on the order of  2-3 mm. Some of these have a perilymphatic distribution within the subpleural and perifissural lungs. Larger, subpleural nodule within the anterior left apex is new from the previous exam measuring 2 x 2.1 cm, image 26/2. Upper Abdomen: Limited imaging through the upper abdomen shows significant interval increase and abdominal ascites. Peritoneal nodularity identified compatible with previously demonstrated metastasis. Left adrenal gland metastasis measures 6.1 x 4.6 cm on today's study. This is compared with 5.1 x 3.9 cm previously. Musculoskeletal: Multifocal bone metastases are better seen on the PET-CT from 10/26/2022 and MRI from 08/30/2022. Similar appearance of compression deformity involving the T6 vertebra. IMPRESSION: 1. Interval progression of disease. 2. Interval  increase in size of left supraclavicular, right paratracheal and subcarinal lymph nodes. 3. New small right pleural effusion. Significant increase in volume of the left pleural effusion which is now moderate to large in volume. 4. Multiple new, subtle tiny lung nodules are identified all measuring on the order of 2-3 mm. Some of these have a perilymphatic distribution within the subpleural and perifissural lungs. Additionally, there is new interlobular septal thickening. Although nonspecific, septal thickening may be seen in the setting of lymphangitic spread of disease within the lungs. 5. New 2.1 cm large nodule identified within the anterior left apex concerning for metastasis. 6. Interval increase in size of left adrenal gland metastasis. 7. Interval increase in abdominal ascites with peritoneal nodularity compatible with peritoneal carcinomatosis. 8. New circumferential wall thickening involving the mid and distal esophagus. Correlate for any clinical signs or symptoms of esophagitis. 9. Multifocal bone metastases are better seen on the PET-CT from 10/26/2022 and MRI from 08/30/2022. 10.  Aortic Atherosclerosis (ICD10-I70.0). Electronically Signed   By: Signa Kell M.D.   On: 10/08/2022 08:35   CT ABDOMEN PELVIS WO CONTRAST  Result Date: 10/08/2022 CLINICAL DATA:  Nausea vomiting EXAM: CT ABDOMEN AND PELVIS WITHOUT CONTRAST TECHNIQUE: Multidetector CT imaging of the abdomen and pelvis was performed following the standard protocol without IV contrast. RADIATION DOSE REDUCTION: This exam was performed according to the departmental dose-optimization program which includes automated exposure control, adjustment of the mA and/or kV according to patient size and/or use of iterative reconstruction technique. COMPARISON:  CT 07/15/2022, PET CT 07/28/2022 FINDINGS: Lower chest: Lung bases demonstrate incompletely visualized large left pleural effusion. Small right-sided pleural effusion and dependent atelectasis  at the right base. Suspicion of mild peribronchovascular nodularity in the right lower lobe, for example series 5, image 18. Hepatobiliary: No calcified gallstone or biliary dilatation. No focal hepatic abnormality allowing for absence of contrast. Pancreas: No pancreatic inflammatory change or ductal dilatation Spleen: Normal in size without focal abnormality. Adrenals/Urinary Tract: Right adrenal gland is normal. Large left adrenal mass measuring 6.3 x 4.6 cm, previously 3.7 x 3.6 cm. Numerous soft tissue nodules in the left Peri renal space concerning for metastatic disease. Overall decreased size of left kidney. Poorly defined upper pole mass appears slightly smaller in the interim, possibly measuring 6.4 by 5.7 cm, previously 8 cm. Mild left hydronephrosis without visible stone. Decompressed urinary bladder. Right kidney shows no hydronephrosis. Stomach/Bowel: The stomach is nonenlarged. No dilated small bowel. No acute bowel wall thickening. Vascular/Lymphatic: Moderate aortic atherosclerosis. No aneurysm. Extensive retroperitoneal adenopathy, though slightly decreased compared to the prior CT. Multiple small pelvic sidewall lymph nodes. Reproductive: Negative prostate Other: No free air. Interim increase in abdominopelvic ascites, now large. Extensive peritoneal metastatic disease with omental caking and diffuse peritoneal nodularity. Musculoskeletal: No acute osseous abnormality IMPRESSION: 1. Incompletely visualized large left-sided pleural effusion  with passive atelectasis at the left base. Small right-sided pleural effusion. New suspected peribronchovascular nodularity in the right lower lobe raising concern for possible lymphangitic spread of tumor 2. No evidence for a bowel obstruction. Increased abdominopelvic ascites now large in volume. 3. Poorly defined mass upper pole left kidney corresponding to history of carcinoma. The mass itself appears smaller compared to the prior CT. Findings consistent  with advanced metastatic disease including extensive peritoneal and retroperitoneal metastatic disease. Increased size of left adrenal metastatic lesion. Aortic Atherosclerosis (ICD10-I70.0). Electronically Signed   By: Jasmine Pang M.D.   On: 10/08/2022 03:46

## 2022-10-10 NOTE — Discharge Summary (Addendum)
Physician Discharge Summary   Patient: Jacob Reynolds MRN: [id number] DOB: 1965-07-16  Admit date:     10/08/2022  Discharge date: 10/10/22  Discharge Physician: Hollice Espy   PCP: [name], FNP   Recommendations at discharge:   Patient will keep his follow-up appointment with oncology on 4/30 Patient approved for [facility] and can be started right away. Will advise oncology that given complaints of increased pain, that patient's Norco 10/325 be increased to every 8 hours or every 6 hours as needed.  Currently at every 12 hours.  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 *  Hospital Course: 57 year old male with past medical history of stage IV renal cancer with bone metastases and hypertension who presented to the emergency room at [facility] in [place] on the early morning hours of 4/27 with intractable nausea, vomiting and abdominal pain that been going on for several days.  Due to cancer progression, patient had had his chemo medications adjusted (with a change in his cabozantinib from 60 mg to 40 mg, starting 40 mg on 4/17.)  Patient presented to the ED with significant abdominal distention and found to have large amount ascites with concerns of lymphangitis spread of tumor as well as large left-sided pleural effusion.  (Patient not hypoxic.)  Patient admitted to the hospitalist service that morning and paracentesis and thoracentesis ordered.  Unfortunately, the services not available at [facility] weekend and patient transferred to [facility] on afternoon of 4/27.   Following arrival, patient underwent ultrasound-guided paracentesis at [facility] and 6.1 L of fluid were removed.  On 4/28, he underwent  a left sided thoracentesis removing 1.6 L of fluid.  Cytology pending, no signs of bacterial infection although cultures pending.  Palliative care consulted and at this time, patient would like to continue doing everything although understanding his terminal condition.  Assessment and Plan: Malignant ascites with secondary abdominal pain causing nausea and vomiting: Status post 6 L removed.  IV albumin given after.  Patient reports feeling better afterwards.  Currently pain controlled.  He had been placed on clear liquids and diet being advanced to regular diet as per patient's request.   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  Status post thoracentesis of left side removing 1.6 L.  Patient not hypoxic.   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  Patient has completed radiation therapy.  Chemotherapy medications adjusted accordingly.  CT scan of chest confirms progression of disease with concerns for lymphangitic spread.  Message sent to patient's oncologist.  He has a follow-up appointment with the patient on Tuesday.  Approved for [employer].   Essential hypertension: Resume home blood pressure medications   Hyperlipidemia: Continue Crestor.   Hyponatremia: In part due to poor p.o. intake and dehydration.  Sodium at 130 on day of discharge      Pain control - [facility] Controlled Substance Reporting System database was reviewed. and patient was instructed, not to drive, operate heavy machinery, perform activities at heights, swimming or participation in water activities or provide baby-sitting services while on Pain, Sleep and Anxiety Medications; until their outpatient Physician has advised to do so again. Also recommended to not to take more than prescribed Pain,  Sleep and Anxiety Medications.  Consultants:  -Palliative care -Interventional radiology  Procedures performed:  Status post paracentesis Status post thoracentesis of left  lung  Disposition: Home Diet recommendation:  Discharge Diet Orders (From admission, onward)     Start     Ordered   10/10/22 0000  Diet - low sodium heart healthy        10/10/22 1035           Regular diet DISCHARGE MEDICATION: Allergies as of 10/10/2022       Reactions   Iodinated Contrast Media Anaphylaxis   Throat felt like closed Was in hospital for allergy and almost died- per patient  Pt verified that it was from CT contrast   Iodine    Throat felt like closed Was in hospital for allergy and almost died- per patient         Medication List     TAKE these medications    albuterol 108 (90 Base) MCG/ACT inhaler Commonly known as: VENTOLIN HFA Inhale 2 puffs into the lungs every 6 (six) hours as needed for wheezing or shortness of breath.   amLODipine 10 MG tablet Commonly known as: Norvasc Take 1 tablet (10 mg total) by mouth daily.   belzutifan 40 MG tablet Commonly known as: WELIREG Take 3 tablets (120 mg total) by mouth daily.   Breztri Aerosphere 160-9-4.8 MCG/ACT Aero Generic drug: Budeson-Glycopyrrol-Formoterol Inhale 2 puffs into the lungs 2 (two) times daily.   HYDROcodone-acetaminophen 10-325 MG tablet Commonly known as: Norco Take 1 tablet by mouth every 12 (twelve) hours as needed. What changed: reasons to take this   loperamide 2 MG capsule Commonly known as: IMODIUM Take 1 capsule (2 mg total) by mouth as needed for diarrhea or loose stools.   magnesium oxide 400 (240 Mg) MG tablet Commonly known as: MAG-OX Take 1 tablet (400 mg total) by mouth daily.   ondansetron 8 MG disintegrating tablet Commonly known as: ZOFRAN-ODT Take 1 tablet (8 mg total) by mouth every 8 (eight) hours as needed for nausea or vomiting.   potassium chloride SA 20 MEQ tablet Commonly known as: KLOR-CON M Take 1 tablet (20 mEq total) by mouth 2 (two) times daily.   rosuvastatin 5 MG tablet Commonly known as: Crestor Take 1 tablet (5 mg total) by mouth  daily.        Discharge Exam: Filed Weights   10/08/22 0215 10/09/22 0423  Weight: 72.6 kg 72.7 kg   General: Alert and oriented x 3, no acute distress Cardiovascular: Regular rate and rhythm, S1-S2  Condition at discharge: poor  The results of significant diagnostics from this hospitalization (including imaging, microbiology, ancillary and laboratory) are listed below for reference.   Imaging Studies: US Paracentesis  Result Date: 10/09/2022 INDICATION: History of stage IV renal cancer presents with abdominal distension. Previous imaging showed large ascites. Request for therapeutic and diagnostic paracentesis. EXAM: ULTRASOUND GUIDED  PARACENTESIS MEDICATIONS: 10 mL 1% lidocaine COMPLICATIONS: None immediate. PROCEDURE: Informed written consent was obtained from the patient after a discussion of the risks, benefits and alternatives to treatment. A timeout was performed prior to the initiation of the procedure. Initial ultrasound scanning demonstrates a large amount of ascites within the right lower abdominal quadrant. The right lower abdomen was prepped and draped in the usual sterile fashion. 1% lidocaine was used for local anesthesia. Following this, a 19 gauge, 7-cm, Yueh catheter was introduced. An ultrasound image was saved for documentation purposes. The paracentesis was performed. The catheter was removed  and a dressing was applied. The patient tolerated the procedure well without immediate post procedural complication. FINDINGS: A total of approximately 6.1 L of cream colored fluid was removed. Samples were sent to the laboratory as requested by the clinical team. IMPRESSION: Successful ultrasound-guided paracentesis yielding 6.1 liters of peritoneal fluid. Read by: Lawernce Ion, PA-C Electronically Signed   By: Malachy Moan M.D.   On: 10/09/2022 15:54   US THORACENTESIS ASP PLEURAL SPACE W/IMG GUIDE  Result Date: 10/09/2022 INDICATION: Patient with history of stage IV renal  cancer presents with shortness of breath. Previous CT showed bilateral pleural effusion left greater than right. Request for therapeutic and diagnostic thoracentesis. EXAM: ULTRASOUND GUIDED LEFT THORACENTESIS MEDICATIONS: 10 mL 1% lidocaine COMPLICATIONS: None immediate. PROCEDURE: An ultrasound guided thoracentesis was thoroughly discussed with the patient and questions answered. The benefits, risks, alternatives and complications were also discussed. The patient understands and wishes to proceed with the procedure. Written consent was obtained. Ultrasound was performed to localize and mark an adequate pocket of fluid in the left chest. The area was then prepped and draped in the normal sterile fashion. 1% Lidocaine was used for local anesthesia. Under ultrasound guidance a 6 Fr Safe-T-Centesis catheter was introduced. Thoracentesis was performed. The catheter was removed and a dressing applied. FINDINGS: A total of approximately 1.6 L of cream colored fluid was removed. Samples were sent to the laboratory as requested by the clinical team. Post procedure chest X-ray reviewed, negative for pneumothorax. IMPRESSION: Successful ultrasound guided left thoracentesis yielding 1.6 L of pleural fluid. Read by: Lawernce Ion, PA-C Electronically Signed   By: Malachy Moan M.D.   On: 10/09/2022 15:54   DG Chest 1 View  Result Date: 10/09/2022 CLINICAL DATA:  Status post thoracentesis. EXAM: CHEST  1 VIEW COMPARISON:  None Available. FINDINGS: A right Port-A-Cath is in good position. A small left pleural effusion with underlying atelectasis is identified. No pneumothorax after thoracentesis. The cardiomediastinal silhouette is normal. Increased interstitial markings bilaterally. No other acute abnormalities. IMPRESSION: 1. No pneumothorax after thoracentesis. 2. Small left pleural effusion with underlying atelectasis. 3. Increased interstitial markings bilaterally may represent edema. Electronically Signed   By: Gerome Sam III M.D.   On: 10/09/2022 11:28   CT CHEST WO CONTRAST  Result Date: 10/08/2022 CLINICAL DATA:  Evaluate for malignancy. History of stage IV renal cell carcinoma. * Tracking Code: BO * EXAM: CT CHEST WITHOUT CONTRAST TECHNIQUE: Multidetector CT imaging of the chest was performed following the standard protocol without IV contrast. RADIATION DOSE REDUCTION: This exam was performed according to the departmental dose-optimization program which includes automated exposure control, adjustment of the mA and/or kV according to patient size and/or use of iterative reconstruction technique. COMPARISON:  PET-CT from 07/28/2022 CT chest from 07/15/2022. MR thoracic spine 08/30/2022. FINDINGS: Cardiovascular: Heart size is normal. Aortic atherosclerosis and coronary artery calcifications. No pericardial effusion. Mediastinum/Nodes: Thyroid gland and trachea appear normal. There is new circumferential wall thickening involving the mid and distal esophagus. Previous tracer avid left supraclavicular lymph node measures 1.1 cm, image 10/2. Previously 0.8 cm. Previous tracer avid left internal mammary lymph node measures 5 mm, image 86/2. previously 6 mm. The previously reference tracer avid hilar lymph nodes are suboptimally visualized on today's exam reflecting lack of IV contrast material. Right paratracheal lymph node measures 1.6 cm, image 55/2. Formally 0.6 cm. Previous tracer avid subcarinal lymph node measures 1.1 cm, image 60/2. Previously 1 cm. Lungs/Pleura: New small right pleural effusion. Moderate to large left pleural  effusion is identified. Previously trace. Interlobular septal thickening is identified within both lungs which has a lower lung zone predominance. There are multiple new, subtle tiny lung nodules identified all measuring on the order of 2-3 mm. Some of these have a perilymphatic distribution within the subpleural and perifissural lungs. Larger, subpleural nodule within the anterior left apex  is new from the previous exam measuring 2 x 2.1 cm, image 26/2. Upper Abdomen: Limited imaging through the upper abdomen shows significant interval increase and abdominal ascites. Peritoneal nodularity identified compatible with previously demonstrated metastasis. Left adrenal gland metastasis measures 6.1 x 4.6 cm on today's study. This is compared with 5.1 x 3.9 cm previously. Musculoskeletal: Multifocal bone metastases are better seen on the PET-CT from 10/26/2022 and MRI from 08/30/2022. Similar appearance of compression deformity involving the T6 vertebra. IMPRESSION: 1. Interval progression of disease. 2. Interval increase in size of left supraclavicular, right paratracheal and subcarinal lymph nodes. 3. New small right pleural effusion. Significant increase in volume of the left pleural effusion which is now moderate to large in volume. 4. Multiple new, subtle tiny lung nodules are identified all measuring on the order of 2-3 mm. Some of these have a perilymphatic distribution within the subpleural and perifissural lungs. Additionally, there is new interlobular septal thickening. Although nonspecific, septal thickening may be seen in the setting of lymphangitic spread of disease within the lungs. 5. New 2.1 cm large nodule identified within the anterior left apex concerning for metastasis. 6. Interval increase in size of left adrenal gland metastasis. 7. Interval increase in abdominal ascites with peritoneal nodularity compatible with peritoneal carcinomatosis. 8. New circumferential wall thickening involving the mid and distal esophagus. Correlate for any clinical signs or symptoms of esophagitis. 9. Multifocal bone metastases are better seen on the PET-CT from 10/26/2022 and MRI from 08/30/2022. 10.  Aortic Atherosclerosis (ICD10-I70.0). Electronically Signed   By: Signa Kell M.D.   On: 10/08/2022 08:35   CT ABDOMEN PELVIS WO CONTRAST  Result Date: 10/08/2022 CLINICAL DATA:  Nausea vomiting EXAM: CT  ABDOMEN AND PELVIS WITHOUT CONTRAST TECHNIQUE: Multidetector CT imaging of the abdomen and pelvis was performed following the standard protocol without IV contrast. RADIATION DOSE REDUCTION: This exam was performed according to the departmental dose-optimization program which includes automated exposure control, adjustment of the mA and/or kV according to patient size and/or use of iterative reconstruction technique. COMPARISON:  CT 07/15/2022, PET CT 07/28/2022 FINDINGS: Lower chest: Lung bases demonstrate incompletely visualized large left pleural effusion. Small right-sided pleural effusion and dependent atelectasis at the right base. Suspicion of mild peribronchovascular nodularity in the right lower lobe, for example series 5, image 18. Hepatobiliary: No calcified gallstone or biliary dilatation. No focal hepatic abnormality allowing for absence of contrast. Pancreas: No pancreatic inflammatory change or ductal dilatation Spleen: Normal in size without focal abnormality. Adrenals/Urinary Tract: Right adrenal gland is normal. Large left adrenal mass measuring 6.3 x 4.6 cm, previously 3.7 x 3.6 cm. Numerous soft tissue nodules in the left Peri renal space concerning for metastatic disease. Overall decreased size of left kidney. Poorly defined upper pole mass appears slightly smaller in the interim, possibly measuring 6.4 by 5.7 cm, previously 8 cm. Mild left hydronephrosis without visible stone. Decompressed urinary bladder. Right kidney shows no hydronephrosis. Stomach/Bowel: The stomach is nonenlarged. No dilated small bowel. No acute bowel wall thickening. Vascular/Lymphatic: Moderate aortic atherosclerosis. No aneurysm. Extensive retroperitoneal adenopathy, though slightly decreased compared to the prior CT. Multiple small pelvic sidewall lymph nodes. Reproductive: Negative prostate  Other: No free air. Interim increase in abdominopelvic ascites, now large. Extensive peritoneal metastatic disease with omental  caking and diffuse peritoneal nodularity. Musculoskeletal: No acute osseous abnormality IMPRESSION: 1. Incompletely visualized large left-sided pleural effusion with passive atelectasis at the left base. Small right-sided pleural effusion. New suspected peribronchovascular nodularity in the right lower lobe raising concern for possible lymphangitic spread of tumor 2. No evidence for a bowel obstruction. Increased abdominopelvic ascites now large in volume. 3. Poorly defined mass upper pole left kidney corresponding to history of carcinoma. The mass itself appears smaller compared to the prior CT. Findings consistent with advanced metastatic disease including extensive peritoneal and retroperitoneal metastatic disease. Increased size of left adrenal metastatic lesion. Aortic Atherosclerosis (ICD10-I70.0). Electronically Signed   By: Jasmine Pang M.D.   On: 10/08/2022 03:46    Microbiology: Results for orders placed or performed during the hospital encounter of 10/08/22  Body fluid culture w Gram Stain     Status: None (Preliminary result)   Collection Time: 10/08/22  4:08 PM   Specimen: PATH Cytology Peritoneal fluid  Result Value Ref Range Status   Specimen Description   Final    PERITONEAL Performed at [facility], [address] [place], [place] [address]    Special Requests   Final    NONE Performed at [facility] [address] [place], [place] [address]    Gram Stain   Final    RARE WBC PRESENT, PREDOMINANTLY PMN NO ORGANISMS SEEN    Culture   Final    NO GROWTH 2 DAYS Performed at [facility], [address] [place], [place] [address]    Report Status PENDING  Incomplete  Body fluid culture w Gram Stain     Status: None (Preliminary result)   Collection Time: 10/09/22 10:56 AM   Specimen: Pleura; Body Fluid  Result Value Ref Range Status   Specimen Description PLEURAL  Final   Special Requests NONE  Final   Gram Stain RARE WBC SEEN NO ORGANISMS SEEN   Final    Culture   Final    NO GROWTH 1 DAY Performed at [facility], [address] [place], [place] [address]    Report Status PENDING  Incomplete    Labs: CBC: Recent Labs  Lab 10/04/22 0922 10/08/22 0240 10/09/22 0211  WBC 5.8 6.1 6.3  NEUTROABS 4.6  --   --   HGB 13.8 14.6 13.3  HCT 42.3 43.5 39.1  MCV 82.5 80.9 80.5  PLT 231 221 200   Basic Metabolic Panel: Recent Labs  Lab 10/04/22 0922 10/08/22 0240 10/09/22 0211 10/10/22 0848  NA 137 129* 128* 130*  K 2.1* 4.9 4.0 4.5  CL 117* 98 95* 96*  CO2 14* 21* 22 22  GLUCOSE 95 129* 92 130*  BUN 11 19 15  21*  CREATININE 0.51* 1.03 1.14 1.23  CALCIUM 4.5* 8.7* 8.0* 8.2*  MG 1.1* 1.9  --   --    Liver Function Tests: Recent Labs  Lab 10/04/22 0922 10/08/22 0240 10/09/22 0211  AST 21 40 27  ALT 15 38 24  ALKPHOS 54 119 86  BILITOT 0.2* 0.7 0.9  PROT 3.6* 7.3 5.8*  ALBUMIN <1.5* 2.5* 2.2*   CBG: No results for input(s): "GLUCAP" in the last 168 hours.  Discharge time spent: less than 30 minutes.  Signed: Hollice Espy, MD [facility] 10/10/2022

## 2022-10-10 NOTE — TOC Initial Note (Addendum)
Transition of Care (TOC) - Initial/Assessment Note   Spoke to patient at bedside. From home alone but has family close by.   PCP Jacob Reynolds   Has transportation to appointments.   Consult : Patient wishes to discuss financial assistance program, has heard of one that pays off medical bills over $5000    Patient stated he was told by "someone" Jacob Reynolds has a hardship program.   Explain to patient he will receive a bill 2 weeks after discharge and there will be a number on there to call to set up a payment and also to see if he qualifies for any assistance.    NCM also emailed Jacob Reynolds with [facility] business office, Jacob Reynolds - [employer] , and oncology NCM at [employer]. Await responses.   Response from Ms Jacob Reynolds : Jacob Reynolds has the Financial Hardship program that requires the patient to have a self pay balance of at least $5000 after insurance.  He does not meet that as he has less than $700 in self pay balances at this time.  Patient aware   Patient voiced understanding   Patient Details  Name: Jacob Reynolds MRN: [id number] Date of Birth: Nov 08, 1965  Transition of Care [facility]) CM/SW Contact:    Kingsley Plan, RN Phone Number: 10/10/2022, 10:34 AM  Clinical Narrative:                   Expected Discharge Plan: Home/Self Care Barriers to Discharge: Continued Medical Work up   Patient Goals and CMS Choice Patient states their goals for this hospitalization and ongoing recovery are:: to return to home     [place] ownership interest in [facility].provided to:: Patient    Expected Discharge Plan and Services     Post Acute Care Choice: NA Living arrangements for the past 2 months: Single Family Home                 DME Arranged: N/A DME Agency: NA       HH Arranged: NA          Prior Living Arrangements/Services Living arrangements for the past 2 months: Single Family Home Lives with:: Self Patient language and need  for interpreter reviewed:: Yes Do you feel safe going back to the place where you live?: Yes      Need for Family Participation in Patient Care: Yes (Comment) Care giver support system in place?: Yes (comment)   Criminal Activity/Legal Involvement Pertinent to Current Situation/Hospitalization: No - Comment as needed  Activities of Daily Living Home Assistive Devices/Equipment: None ADL Screening (condition at time of admission) Patient's cognitive ability adequate to safely complete daily activities?: Yes Is the patient deaf or have difficulty hearing?: No Does the patient have difficulty seeing, even when wearing glasses/contacts?: No Does the patient have difficulty concentrating, remembering, or making decisions?: No Patient able to express need for assistance with ADLs?: Yes Does the patient have difficulty dressing or bathing?: No Independently performs ADLs?: Yes (appropriate for developmental age) Does the patient have difficulty walking or climbing stairs?: Yes Weakness of Legs: Both Weakness of Arms/Hands: None  Permission Sought/Granted   Permission granted to share information with : No              Emotional Assessment Appearance:: Appears stated age Attitude/Demeanor/Rapport: Engaged Affect (typically observed): Accepting Orientation: : Oriented to Self, Oriented to Place, Oriented to  Time, Oriented to Situation Alcohol / Substance Use: Not Applicable Psych Involvement:  No (comment)  Admission diagnosis:  Generalized abdominal pain [R10.84] Other ascites [R18.8] Intractable nausea and vomiting [R11.2] Patient Active Problem List   Diagnosis Date Noted   Essential hypertension 10/08/2022   Intractable nausea and vomiting 10/08/2022   Ascites, malignant 10/08/2022   Cancer-related pain 10/08/2022   Hyponatremia 10/08/2022   Hypokalemia 10/04/2022   Mixed hyperlipidemia 08/08/2022   Tobacco abuse 08/08/2022   Port-A-Cath in place 11/10/2021   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 [place], [place] - [address] [place] [address] Phone: [phone] Fax: [phone]  [id number] [place] [place] - [address] [place] [address] Phone: [phone] Fax: [phone]  [facility] [place], [place] - [address] [place] [address] Phone: [phone] Fax: [phone]     Social Determinants of Health (SDOH) Social History: SDOH Screenings   Depression [phone]): Medium Risk (08/08/2022)  Tobacco Use: High Risk (10/08/2022)   SDOH Interventions:     Readmission Risk Interventions     No data to display

## 2022-10-10 NOTE — Telephone Encounter (Addendum)
Oral Oncology Patient Advocate Encounter  Prior Authorization for Annett Fabian has been approved.    PA# [id number]  Effective dates: 10/10/22 through 10/10/23  Patient must fill through [facility] since medication is limited distribution.   Script and all supporting documentation sent to [facility] for processing  Patient scheduled to receive Welireg from [facility] on 2022-10-19  Patient has a $0.00 co-pay   Ardeen Fillers, CPhT Oncology Pharmacy Patient Advocate  [facility]  [phone] (phone) [phone] (fax) 10/10/2022 10:29 AM

## 2022-10-10 NOTE — Telephone Encounter (Signed)
Oral Oncology Patient Advocate Encounter  New authorization   Received notification that prior authorization for Jacob Reynolds is required.   PA submitted on 10/10/22  Key [id number]  Status is pending     Ardeen Fillers, CPhT Oncology Pharmacy Patient Advocate  [facility]  [phone] (phone) [phone] (fax) 10/10/2022 8:56 AM

## 2022-10-11 ENCOUNTER — Other Ambulatory Visit (HOSPITAL_COMMUNITY): Payer: Self-pay | Admitting: *Deleted

## 2022-10-11 ENCOUNTER — Other Ambulatory Visit: Payer: Self-pay

## 2022-10-11 ENCOUNTER — Inpatient Hospital Stay: Payer: 59

## 2022-10-11 ENCOUNTER — Other Ambulatory Visit (HOSPITAL_COMMUNITY): Payer: Self-pay

## 2022-10-11 ENCOUNTER — Other Ambulatory Visit: Payer: Self-pay | Admitting: *Deleted

## 2022-10-11 ENCOUNTER — Inpatient Hospital Stay: Payer: 59 | Admitting: Hematology

## 2022-10-11 ENCOUNTER — Inpatient Hospital Stay (HOSPITAL_BASED_OUTPATIENT_CLINIC_OR_DEPARTMENT_OTHER): Payer: 59 | Admitting: Hematology

## 2022-10-11 VITALS — BP 131/93 | HR 112 | Resp 20 | Ht 71.0 in | Wt 146.2 lb

## 2022-10-11 DIAGNOSIS — C642 Malignant neoplasm of left kidney, except renal pelvis: Secondary | ICD-10-CM | POA: Diagnosis not present

## 2022-10-11 DIAGNOSIS — E222 Syndrome of inappropriate secretion of antidiuretic hormone: Secondary | ICD-10-CM | POA: Diagnosis not present

## 2022-10-11 DIAGNOSIS — F17210 Nicotine dependence, cigarettes, uncomplicated: Secondary | ICD-10-CM

## 2022-10-11 DIAGNOSIS — J90 Pleural effusion, not elsewhere classified: Secondary | ICD-10-CM | POA: Diagnosis not present

## 2022-10-11 DIAGNOSIS — Z8042 Family history of malignant neoplasm of prostate: Secondary | ICD-10-CM | POA: Diagnosis not present

## 2022-10-11 DIAGNOSIS — Z801 Family history of malignant neoplasm of trachea, bronchus and lung: Secondary | ICD-10-CM

## 2022-10-11 DIAGNOSIS — J029 Acute pharyngitis, unspecified: Secondary | ICD-10-CM | POA: Diagnosis not present

## 2022-10-11 DIAGNOSIS — I10 Essential (primary) hypertension: Secondary | ICD-10-CM

## 2022-10-11 DIAGNOSIS — E871 Hypo-osmolality and hyponatremia: Secondary | ICD-10-CM | POA: Diagnosis not present

## 2022-10-11 DIAGNOSIS — C7951 Secondary malignant neoplasm of bone: Secondary | ICD-10-CM

## 2022-10-11 DIAGNOSIS — R634 Abnormal weight loss: Secondary | ICD-10-CM

## 2022-10-11 DIAGNOSIS — M549 Dorsalgia, unspecified: Secondary | ICD-10-CM

## 2022-10-11 DIAGNOSIS — Z803 Family history of malignant neoplasm of breast: Secondary | ICD-10-CM | POA: Diagnosis not present

## 2022-10-11 DIAGNOSIS — R0602 Shortness of breath: Secondary | ICD-10-CM | POA: Diagnosis not present

## 2022-10-11 DIAGNOSIS — R0789 Other chest pain: Secondary | ICD-10-CM | POA: Diagnosis not present

## 2022-10-11 DIAGNOSIS — C649 Malignant neoplasm of unspecified kidney, except renal pelvis: Secondary | ICD-10-CM

## 2022-10-11 LAB — BODY FLUID CULTURE W GRAM STAIN

## 2022-10-11 MED ORDER — HYDROMORPHONE HCL 2 MG PO TABS: 2.0000 mg | ORAL_TABLET | Freq: Four times a day (QID) | ORAL | 0 refills | Status: DC | PRN

## 2022-10-11 NOTE — Patient Instructions (Addendum)
[facility] at [facility] Discharge Instructions   You were seen and examined today by Dr. Ellin Saba.  He reviewed the labs from your hospital stay that are stable/improving.   He discussed with you that the fluid that was removed from your lungs and your abdomen had cancer cells in the fluid. This means the fluid build up is at least partially coming from the cancer. The fluid is likely also coming from the protein in your blood being low. Try to take in at least 2,000 calories per day, the higher the protein content the better.   Dr. Kirtland Bouchard wants you to stop the Cabometyx pill. He sent a new pill for you to take that we will try to see if you tolerate better.      Thank you for choosing [facility] at [facility] to provide your oncology and hematology care.  To afford each patient quality time with our provider, please arrive at least 15 minutes before your scheduled appointment time.   If you have a lab appointment with the Cancer Center please come in thru the Main Entrance and check in at the main information desk.  You need to re-schedule your appointment should you arrive 10 or more minutes late.  We strive to give you quality time with our providers, and arriving late affects you and other patients whose appointments are after yours.  Also, if you no show three or more times for appointments you may be dismissed from the clinic at the providers discretion.     Again, thank you for choosing [facility].  Our hope is that these requests will decrease the amount of time that you wait before being seen by our physicians.       _____________________________________________________________  Should you have questions after your visit to [facility], please contact our office at [phone] and follow the prompts.  Our office hours are 8:00 a.m. and 4:30 p.m. Monday - Friday.  Please note that voicemails left after 4:00  p.m. may not be returned until the following business day.  We are closed weekends and major holidays.  You do have access to a nurse 24-7, just call the main number to the clinic [phone] and do not press any options, hold on the line and a nurse will answer the phone.    For prescription refill requests, have your pharmacy contact our office and allow 72 hours.    Due to Covid, you will need to wear a mask upon entering the hospital. If you do not have a mask, a mask will be given to you at the Main Entrance upon arrival. For doctor visits, patients may have 1 support person age 87 or older with them. For treatment visits, patients can not have anyone with them due to social distancing guidelines and our immunocompromised population.

## 2022-10-13 ENCOUNTER — Emergency Department (HOSPITAL_COMMUNITY): Payer: 59

## 2022-10-13 ENCOUNTER — Observation Stay (HOSPITAL_COMMUNITY): Payer: 59

## 2022-10-13 ENCOUNTER — Inpatient Hospital Stay (HOSPITAL_COMMUNITY)
Admission: EM | Admit: 2022-10-13 | Discharge: 2022-11-12 | DRG: 643 | Disposition: E | Payer: 59 | Attending: Internal Medicine | Admitting: Internal Medicine

## 2022-10-13 ENCOUNTER — Other Ambulatory Visit: Payer: Self-pay

## 2022-10-13 ENCOUNTER — Encounter (HOSPITAL_COMMUNITY): Payer: Self-pay

## 2022-10-13 DIAGNOSIS — R1310 Dysphagia, unspecified: Secondary | ICD-10-CM | POA: Diagnosis present

## 2022-10-13 DIAGNOSIS — Z888 Allergy status to other drugs, medicaments and biological substances status: Secondary | ICD-10-CM

## 2022-10-13 DIAGNOSIS — R531 Weakness: Secondary | ICD-10-CM | POA: Diagnosis not present

## 2022-10-13 DIAGNOSIS — M545 Low back pain, unspecified: Secondary | ICD-10-CM | POA: Diagnosis present

## 2022-10-13 DIAGNOSIS — Z743 Need for continuous supervision: Secondary | ICD-10-CM | POA: Diagnosis not present

## 2022-10-13 DIAGNOSIS — Z820 Family history of epilepsy and other diseases of the nervous system: Secondary | ICD-10-CM

## 2022-10-13 DIAGNOSIS — R64 Cachexia: Secondary | ICD-10-CM | POA: Diagnosis present

## 2022-10-13 DIAGNOSIS — R0602 Shortness of breath: Secondary | ICD-10-CM | POA: Diagnosis not present

## 2022-10-13 DIAGNOSIS — E8770 Fluid overload, unspecified: Secondary | ICD-10-CM | POA: Diagnosis present

## 2022-10-13 DIAGNOSIS — E43 Unspecified severe protein-calorie malnutrition: Secondary | ICD-10-CM

## 2022-10-13 DIAGNOSIS — Z515 Encounter for palliative care: Secondary | ICD-10-CM

## 2022-10-13 DIAGNOSIS — T7840XA Allergy, unspecified, initial encounter: Secondary | ICD-10-CM | POA: Diagnosis not present

## 2022-10-13 DIAGNOSIS — Z9221 Personal history of antineoplastic chemotherapy: Secondary | ICD-10-CM

## 2022-10-13 DIAGNOSIS — E875 Hyperkalemia: Secondary | ICD-10-CM | POA: Diagnosis present

## 2022-10-13 DIAGNOSIS — R061 Stridor: Secondary | ICD-10-CM | POA: Diagnosis not present

## 2022-10-13 DIAGNOSIS — R0609 Other forms of dyspnea: Principal | ICD-10-CM

## 2022-10-13 DIAGNOSIS — G8929 Other chronic pain: Secondary | ICD-10-CM | POA: Diagnosis present

## 2022-10-13 DIAGNOSIS — J029 Acute pharyngitis, unspecified: Secondary | ICD-10-CM | POA: Diagnosis not present

## 2022-10-13 DIAGNOSIS — R079 Chest pain, unspecified: Secondary | ICD-10-CM | POA: Diagnosis not present

## 2022-10-13 DIAGNOSIS — E869 Volume depletion, unspecified: Secondary | ICD-10-CM | POA: Diagnosis present

## 2022-10-13 DIAGNOSIS — I5020 Unspecified systolic (congestive) heart failure: Secondary | ICD-10-CM | POA: Diagnosis present

## 2022-10-13 DIAGNOSIS — Z8042 Family history of malignant neoplasm of prostate: Secondary | ICD-10-CM

## 2022-10-13 DIAGNOSIS — Z803 Family history of malignant neoplasm of breast: Secondary | ICD-10-CM

## 2022-10-13 DIAGNOSIS — E871 Hypo-osmolality and hyponatremia: Secondary | ICD-10-CM | POA: Diagnosis not present

## 2022-10-13 DIAGNOSIS — I959 Hypotension, unspecified: Secondary | ICD-10-CM | POA: Diagnosis present

## 2022-10-13 DIAGNOSIS — Z91041 Radiographic dye allergy status: Secondary | ICD-10-CM

## 2022-10-13 DIAGNOSIS — R7989 Other specified abnormal findings of blood chemistry: Secondary | ICD-10-CM

## 2022-10-13 DIAGNOSIS — Z1152 Encounter for screening for COVID-19: Secondary | ICD-10-CM

## 2022-10-13 DIAGNOSIS — C7951 Secondary malignant neoplasm of bone: Secondary | ICD-10-CM | POA: Diagnosis not present

## 2022-10-13 DIAGNOSIS — R9431 Abnormal electrocardiogram [ECG] [EKG]: Secondary | ICD-10-CM | POA: Diagnosis present

## 2022-10-13 DIAGNOSIS — R0789 Other chest pain: Secondary | ICD-10-CM | POA: Diagnosis not present

## 2022-10-13 DIAGNOSIS — R000 Tachycardia, unspecified: Secondary | ICD-10-CM | POA: Diagnosis not present

## 2022-10-13 DIAGNOSIS — Z923 Personal history of irradiation: Secondary | ICD-10-CM

## 2022-10-13 DIAGNOSIS — I110 Hypertensive heart disease with heart failure: Secondary | ICD-10-CM | POA: Diagnosis present

## 2022-10-13 DIAGNOSIS — E785 Hyperlipidemia, unspecified: Secondary | ICD-10-CM | POA: Diagnosis present

## 2022-10-13 DIAGNOSIS — M79605 Pain in left leg: Secondary | ICD-10-CM | POA: Diagnosis present

## 2022-10-13 DIAGNOSIS — I471 Supraventricular tachycardia, unspecified: Secondary | ICD-10-CM | POA: Diagnosis present

## 2022-10-13 DIAGNOSIS — I10 Essential (primary) hypertension: Secondary | ICD-10-CM

## 2022-10-13 DIAGNOSIS — F17210 Nicotine dependence, cigarettes, uncomplicated: Secondary | ICD-10-CM | POA: Diagnosis present

## 2022-10-13 DIAGNOSIS — Z79899 Other long term (current) drug therapy: Secondary | ICD-10-CM

## 2022-10-13 DIAGNOSIS — J189 Pneumonia, unspecified organism: Secondary | ICD-10-CM | POA: Diagnosis not present

## 2022-10-13 DIAGNOSIS — F419 Anxiety disorder, unspecified: Secondary | ICD-10-CM | POA: Diagnosis present

## 2022-10-13 DIAGNOSIS — R54 Age-related physical debility: Secondary | ICD-10-CM | POA: Diagnosis present

## 2022-10-13 DIAGNOSIS — R180 Malignant ascites: Secondary | ICD-10-CM

## 2022-10-13 DIAGNOSIS — C649 Malignant neoplasm of unspecified kidney, except renal pelvis: Secondary | ICD-10-CM | POA: Diagnosis not present

## 2022-10-13 DIAGNOSIS — Z66 Do not resuscitate: Secondary | ICD-10-CM | POA: Diagnosis not present

## 2022-10-13 DIAGNOSIS — I429 Cardiomyopathy, unspecified: Secondary | ICD-10-CM | POA: Diagnosis present

## 2022-10-13 DIAGNOSIS — D849 Immunodeficiency, unspecified: Secondary | ICD-10-CM | POA: Diagnosis present

## 2022-10-13 DIAGNOSIS — I499 Cardiac arrhythmia, unspecified: Secondary | ICD-10-CM | POA: Diagnosis not present

## 2022-10-13 DIAGNOSIS — E222 Syndrome of inappropriate secretion of antidiuretic hormone: Principal | ICD-10-CM | POA: Diagnosis present

## 2022-10-13 DIAGNOSIS — R627 Adult failure to thrive: Secondary | ICD-10-CM | POA: Diagnosis present

## 2022-10-13 DIAGNOSIS — J910 Malignant pleural effusion: Secondary | ICD-10-CM | POA: Diagnosis present

## 2022-10-13 DIAGNOSIS — Z801 Family history of malignant neoplasm of trachea, bronchus and lung: Secondary | ICD-10-CM

## 2022-10-13 DIAGNOSIS — D497 Neoplasm of unspecified behavior of endocrine glands and other parts of nervous system: Secondary | ICD-10-CM | POA: Diagnosis present

## 2022-10-13 DIAGNOSIS — J9601 Acute respiratory failure with hypoxia: Secondary | ICD-10-CM | POA: Diagnosis present

## 2022-10-13 DIAGNOSIS — R112 Nausea with vomiting, unspecified: Secondary | ICD-10-CM | POA: Diagnosis not present

## 2022-10-13 DIAGNOSIS — Z6820 Body mass index (BMI) 20.0-20.9, adult: Secondary | ICD-10-CM

## 2022-10-13 DIAGNOSIS — R200 Anesthesia of skin: Secondary | ICD-10-CM | POA: Diagnosis not present

## 2022-10-13 DIAGNOSIS — E8809 Other disorders of plasma-protein metabolism, not elsewhere classified: Secondary | ICD-10-CM | POA: Diagnosis present

## 2022-10-13 DIAGNOSIS — Z8249 Family history of ischemic heart disease and other diseases of the circulatory system: Secondary | ICD-10-CM

## 2022-10-13 DIAGNOSIS — J90 Pleural effusion, not elsewhere classified: Secondary | ICD-10-CM | POA: Diagnosis not present

## 2022-10-13 LAB — I-STAT VENOUS BLOOD GAS, ED
Acid-Base Excess: 2 mmol/L (ref 0.0–2.0)
Bicarbonate: 23.1 mmol/L (ref 20.0–28.0)
Calcium, Ion: 1 mmol/L — ABNORMAL LOW (ref 1.15–1.40)
HCT: 44 % (ref 39.0–52.0)
Hemoglobin: 15 g/dL (ref 13.0–17.0)
O2 Saturation: 74 %
Potassium: 5 mmol/L (ref 3.5–5.1)
Sodium: 123 mmol/L — ABNORMAL LOW (ref 135–145)
TCO2: 24 mmol/L (ref 22–32)
pCO2, Ven: 27 mmHg — ABNORMAL LOW (ref 44–60)
pH, Ven: 7.54 — ABNORMAL HIGH (ref 7.25–7.43)
pO2, Ven: 33 mmHg (ref 32–45)

## 2022-10-13 LAB — CBC WITH DIFFERENTIAL/PLATELET
Abs Immature Granulocytes: 0 10*3/uL (ref 0.00–0.07)
Basophils Absolute: 0.1 10*3/uL (ref 0.0–0.1)
Basophils Relative: 1 %
Eosinophils Absolute: 0.1 10*3/uL (ref 0.0–0.5)
Eosinophils Relative: 2 %
HCT: 42.5 % (ref 39.0–52.0)
Hemoglobin: 14.6 g/dL (ref 13.0–17.0)
Lymphocytes Relative: 8 %
Lymphs Abs: 0.6 10*3/uL — ABNORMAL LOW (ref 0.7–4.0)
MCH: 27.8 pg (ref 26.0–34.0)
MCHC: 34.4 g/dL (ref 30.0–36.0)
MCV: 80.8 fL (ref 80.0–100.0)
Monocytes Absolute: 0.5 10*3/uL (ref 0.1–1.0)
Monocytes Relative: 7 %
Neutro Abs: 6.1 10*3/uL (ref 1.7–7.7)
Neutrophils Relative %: 82 %
Platelets: 190 10*3/uL (ref 150–400)
RBC: 5.26 MIL/uL (ref 4.22–5.81)
RDW: 25.2 % — ABNORMAL HIGH (ref 11.5–15.5)
WBC: 7.4 10*3/uL (ref 4.0–10.5)
nRBC: 0 % (ref 0.0–0.2)
nRBC: 0 /100 WBC

## 2022-10-13 LAB — I-STAT CHEM 8, ED
BUN: 25 mg/dL — ABNORMAL HIGH (ref 6–20)
Calcium, Ion: 0.98 mmol/L — ABNORMAL LOW (ref 1.15–1.40)
Chloride: 94 mmol/L — ABNORMAL LOW (ref 98–111)
Creatinine, Ser: 1.2 mg/dL (ref 0.61–1.24)
Glucose, Bld: 121 mg/dL — ABNORMAL HIGH (ref 70–99)
HCT: 45 % (ref 39.0–52.0)
Hemoglobin: 15.3 g/dL (ref 13.0–17.0)
Potassium: 5 mmol/L (ref 3.5–5.1)
Sodium: 123 mmol/L — ABNORMAL LOW (ref 135–145)
TCO2: 22 mmol/L (ref 22–32)

## 2022-10-13 LAB — RESPIRATORY PANEL BY PCR

## 2022-10-13 LAB — SARS CORONAVIRUS 2 BY RT PCR: SARS Coronavirus 2 by RT PCR: NEGATIVE

## 2022-10-13 LAB — PHOSPHORUS: Phosphorus: 3.3 mg/dL (ref 2.5–4.6)

## 2022-10-13 LAB — COMPREHENSIVE METABOLIC PANEL
ALT: 24 U/L (ref 0–44)
AST: 41 U/L (ref 15–41)
Albumin: 1.9 g/dL — ABNORMAL LOW (ref 3.5–5.0)
Alkaline Phosphatase: 162 U/L — ABNORMAL HIGH (ref 38–126)
Anion gap: 14 (ref 5–15)
BUN: 22 mg/dL — ABNORMAL HIGH (ref 6–20)
CO2: 19 mmol/L — ABNORMAL LOW (ref 22–32)
Calcium: 8.5 mg/dL — ABNORMAL LOW (ref 8.9–10.3)
Chloride: 89 mmol/L — ABNORMAL LOW (ref 98–111)
Creatinine, Ser: 1.23 mg/dL (ref 0.61–1.24)
GFR, Estimated: >60 mL/min (ref >60)
Glucose, Bld: 120 mg/dL — ABNORMAL HIGH (ref 70–99)
Potassium: 5 mmol/L (ref 3.5–5.1)
Sodium: 122 mmol/L — ABNORMAL LOW (ref 135–145)
Total Bilirubin: 0.7 mg/dL (ref 0.3–1.2)
Total Protein: 6 g/dL — ABNORMAL LOW (ref 6.5–8.1)

## 2022-10-13 LAB — PREALBUMIN: Prealbumin: 7 mg/dL — ABNORMAL LOW (ref 18–38)

## 2022-10-13 LAB — TROPONIN I (HIGH SENSITIVITY)
Troponin I (High Sensitivity): 24 ng/L — ABNORMAL HIGH (ref <18)
Troponin I (High Sensitivity): 24 ng/L — ABNORMAL HIGH (ref <18)

## 2022-10-13 LAB — SODIUM, URINE, RANDOM: Sodium, Ur: <10 mmol/L

## 2022-10-13 LAB — PROCALCITONIN: Procalcitonin: 0.63 ng/mL

## 2022-10-13 LAB — OSMOLALITY, URINE: Osmolality, Ur: 758 mOsm/kg (ref 300–900)

## 2022-10-13 LAB — BRAIN NATRIURETIC PEPTIDE: B Natriuretic Peptide: 22.3 pg/mL (ref 0.0–100.0)

## 2022-10-13 MED ORDER — SODIUM CHLORIDE 0.9% FLUSH
3.0000 mL | Freq: Two times a day (BID) | INTRAVENOUS | Status: DC
  Administered 2022-10-13 – 2022-10-28 (×18): 3 mL via INTRAVENOUS

## 2022-10-13 MED ORDER — SODIUM CHLORIDE 0.9 % IV SOLN
100.0000 mg | Freq: Two times a day (BID) | INTRAVENOUS | Status: DC
  Administered 2022-10-13 – 2022-10-14 (×2): 100 mg via INTRAVENOUS
  Filled 2022-10-13 (×2): qty 100

## 2022-10-13 MED ORDER — TECHNETIUM TO 99M ALBUMIN AGGREGATED
3.7000 | Freq: Once | INTRAVENOUS | Status: AC | PRN
  Administered 2022-10-13: 3.7 via INTRAVENOUS

## 2022-10-13 MED ORDER — ACETAMINOPHEN 325 MG PO TABS
650.0000 mg | ORAL_TABLET | Freq: Four times a day (QID) | ORAL | Status: DC | PRN
  Administered 2022-10-16 – 2022-10-19 (×5): 650 mg via ORAL
  Administered 2022-10-20 – 2022-10-21 (×3): 325 mg via ORAL
  Filled 2022-10-13 (×8): qty 2

## 2022-10-13 MED ORDER — AMLODIPINE BESYLATE 10 MG PO TABS
10.0000 mg | ORAL_TABLET | Freq: Every day | ORAL | Status: DC
  Administered 2022-10-13 – 2022-10-16 (×4): 10 mg via ORAL
  Filled 2022-10-13: qty 1
  Filled 2022-10-13: qty 2
  Filled 2022-10-13 (×2): qty 1

## 2022-10-13 MED ORDER — TRIMETHOBENZAMIDE HCL 100 MG/ML IM SOLN: 200.0000 mg | Freq: Four times a day (QID) | INTRAMUSCULAR | Status: DC | PRN

## 2022-10-13 MED ORDER — ALBUTEROL SULFATE (2.5 MG/3ML) 0.083% IN NEBU: 2.5000 mg | INHALATION_SOLUTION | RESPIRATORY_TRACT | Status: DC | PRN

## 2022-10-13 MED ORDER — ALBUTEROL SULFATE (2.5 MG/3ML) 0.083% IN NEBU
2.5000 mg | INHALATION_SOLUTION | Freq: Once | RESPIRATORY_TRACT | Status: AC
  Administered 2022-10-13: 2.5 mg via RESPIRATORY_TRACT
  Filled 2022-10-13: qty 3

## 2022-10-13 MED ORDER — ROSUVASTATIN CALCIUM 5 MG PO TABS
5.0000 mg | ORAL_TABLET | Freq: Every day | ORAL | Status: DC
  Administered 2022-10-13 – 2022-10-28 (×16): 5 mg via ORAL
  Filled 2022-10-13 (×16): qty 1

## 2022-10-13 MED ORDER — EPINEPHRINE 0.3 MG/0.3ML IJ SOAJ: 0.3000 mg | Freq: Once | INTRAMUSCULAR | Status: DC | PRN

## 2022-10-13 MED ORDER — BELZUTIFAN 40 MG PO TABS: 120.0000 mg | ORAL_TABLET | Freq: Every day | ORAL | Status: DC

## 2022-10-13 MED ORDER — LOPERAMIDE HCL 2 MG PO CAPS: 2.0000 mg | ORAL_CAPSULE | ORAL | Status: DC | PRN

## 2022-10-13 MED ORDER — HYDROMORPHONE HCL 2 MG PO TABS
2.0000 mg | ORAL_TABLET | Freq: Four times a day (QID) | ORAL | Status: DC | PRN
  Administered 2022-10-13 – 2022-10-27 (×27): 2 mg via ORAL
  Filled 2022-10-13 (×27): qty 1

## 2022-10-13 MED ORDER — CALCIUM GLUCONATE-NACL 2-0.675 GM/100ML-% IV SOLN
2.0000 g | Freq: Once | INTRAVENOUS | Status: AC
  Administered 2022-10-13: 2000 mg via INTRAVENOUS
  Filled 2022-10-13 (×2): qty 100

## 2022-10-13 MED ORDER — HYDROCODONE-ACETAMINOPHEN 10-325 MG PO TABS: 1.0000 | ORAL_TABLET | Freq: Two times a day (BID) | ORAL | Status: DC | PRN

## 2022-10-13 MED ORDER — LIDOCAINE 5 % EX PTCH
2.0000 | MEDICATED_PATCH | CUTANEOUS | Status: DC
  Administered 2022-10-13 – 2022-10-28 (×15): 2 via TRANSDERMAL
  Filled 2022-10-13 (×16): qty 2

## 2022-10-13 MED ORDER — SODIUM CHLORIDE 0.9 % IV SOLN
2.0000 g | INTRAVENOUS | Status: DC
  Administered 2022-10-13 – 2022-10-21 (×9): 2 g via INTRAVENOUS
  Filled 2022-10-13 (×9): qty 20

## 2022-10-13 MED ORDER — ENSURE ENLIVE PO LIQD
237.0000 mL | Freq: Two times a day (BID) | ORAL | Status: DC
  Administered 2022-10-13 – 2022-10-21 (×10): 237 mL via ORAL

## 2022-10-13 MED ORDER — ACETAMINOPHEN 650 MG RE SUPP: 650.0000 mg | Freq: Four times a day (QID) | RECTAL | Status: DC | PRN

## 2022-10-13 MED ORDER — ENSURE ENLIVE PO LIQD
237.0000 mL | Freq: Two times a day (BID) | ORAL | Status: DC
  Administered 2022-10-13: 237 mL via ORAL

## 2022-10-13 MED ORDER — ENOXAPARIN SODIUM 40 MG/0.4ML IJ SOSY
40.0000 mg | PREFILLED_SYRINGE | INTRAMUSCULAR | Status: DC
  Administered 2022-10-13 – 2022-10-28 (×16): 40 mg via SUBCUTANEOUS
  Filled 2022-10-13 (×16): qty 0.4

## 2022-10-13 NOTE — ED Triage Notes (Addendum)
Patient arrived with EMS from home reports  "throat closing"with SOB this evening , received Epi nebulizer treatment prior to arrival with mild relief by EMS. No cough or fever . Patient suspects allergic reaction , feels similar to allergic reaction in the past.

## 2022-10-13 NOTE — H&P (Signed)
History and Physical    Patient: Jacob Reynolds UJW:[id number] DOB: 20-Jul-1965 DOA: 10/13/2022 DOS: the patient was seen and examined on 10/13/2022 PCP: [name], FNP  Patient coming from: Home lives alone  Chief Complaint:  Chief Complaint  Patient presents with   Throat closing   HPI: Jacob Reynolds is a 57 y.o. male with medical history significant of  hypertension and stage IV renal cancer with metastases to bone who presents with complaints of feeling like his throat was closing up yesterday evening.  He question if it was an allergic reaction as he is on so many medications.  He had not darted belzutifan 120 mg daily he had as he was supposed to start today.  He is unsure of what he may have come in contact with onset symptoms.  Patient just recently hospitalized 4/27-4/29 with intractable nausea, vomiting, and abdominal pain ascites and pleural effusion requiring paracentesis along with thoracentesis.  Palliative care has been consulted and noted at that time patient wanted to continue doing everything understanding illness was terminal.  Since being home patient notes that he has had increased swelling in his abdomen, but not to the extent when hospitalized last.  He reports that he has been dealing with dry mouth and everything tastes funny.  He reports that whenever he tries to drink sodas it causes a slight burning sensation.  At this time he still reports having some discomfort in his throat.  He previously had thrush, but reported completing pills that were previously prescribed.  When occurred patient also reported having associated symptoms of some shortness of breath, substernal chest discomfort, reported that he had been having difficulty with getting around home, nausea, and back pain..  En route with EMS patient had been given epinephrine nebulizer treatment with some improvement in symptoms.  In the the emergency department patient was noted to be tachycardic and  tachypneic.  O2 saturations were maintained on room air.  Labs significant for sodium 122, CO2 19, BUN 22 creatinine 1.23, calcium 8.5, and high-sensitivity troponins 24 x 2.  Venous blood gas did not show any signs of hypercapnia.  X-rays of the neck have been obtained due to patient reporting throat closing up which were negative.Chest x-ray noted diffuse interstitial groundglass opacities mildly increased compared to prior possibly representing edema superimposed on interstitial changes seen on prior CT with a small left-sided effusion.  VQ scan was ordered to rule out the possibility of pulmonary embolism given history of contrast allergy.  Review of Systems: As mentioned in the history of present illness. All other systems reviewed and are negative. Past Medical History:  Diagnosis Date   Cancer (HCC)    Plantar fasciitis    Port-A-Cath in place 11/10/2021   Past Surgical History:  Procedure Laterality Date   IR IMAGING GUIDED PORT INSERTION  10/15/2021   Social History:  reports that he has been smoking cigarettes. He started smoking about 44 years ago. He has a 36.00 pack-year smoking history. He has never used smokeless tobacco. He reports current alcohol use of about 7.0 standard drinks of alcohol per week. He reports current drug use. Frequency: 1.00 time per week. Drug: Marijuana.  Allergies  Allergen Reactions   Iodinated Contrast Media Anaphylaxis    Throat felt like closed Was in hospital for allergy and almost died- per patient  Pt verified that it was from CT contrast   Iodine     Throat felt like closed Was in hospital for allergy and  almost died- per patient     Family History  Problem Relation Age of Onset   Hypertension Mother    Parkinson's disease Father    Breast cancer Maternal Aunt        dx >50   Lung cancer Paternal Grandfather    Breast cancer Cousin        dx 4s   Prostate cancer Cousin        dx 62s    Prior to Admission medications   Medication Sig  Start Date End Date Taking? Authorizing Provider  albuterol (VENTOLIN HFA) 108 (90 Base) MCG/ACT inhaler Inhale 2 puffs into the lungs every 6 (six) hours as needed for wheezing or shortness of breath. 08/08/22   [name], FNP  amLODipine (NORVASC) 10 MG tablet Take 1 tablet (10 mg total) by mouth daily. 09/20/22   Doreatha Massed, MD  belzutifan [facility]) 40 MG tablet Take 3 tablets (120 mg total) by mouth daily. 10/10/22   Doreatha Massed, MD  Budeson-Glycopyrrol-Formoterol (BREZTRI AEROSPHERE) 160-9-4.8 MCG/ACT AERO Inhale 2 puffs into the lungs 2 (two) times daily. 08/08/22   [name], FNP  HYDROcodone-acetaminophen (NORCO) 10-325 MG tablet Take 1 tablet by mouth every 12 (twelve) hours as needed. Patient taking differently: Take 1 tablet by mouth every 12 (twelve) hours as needed for moderate pain or severe pain. 09/05/22   Doreatha Massed, MD  HYDROmorphone (DILAUDID) 2 MG tablet Take 1 tablet (2 mg total) by mouth every 6 (six) hours as needed for severe pain. 10/11/22   Doreatha Massed, MD  loperamide (IMODIUM) 2 MG capsule Take 1 capsule (2 mg total) by mouth as needed for diarrhea or loose stools. 08/26/22   Carnella [place], PA-C  magnesium oxide (MAG-OX) 400 (240 Mg) MG tablet Take 1 tablet (400 mg total) by mouth daily. 10/04/22   Doreatha Massed, MD  ondansetron (ZOFRAN-ODT) 8 MG disintegrating tablet Take 1 tablet (8 mg total) by mouth every 8 (eight) hours as needed for nausea or vomiting. 09/20/22   Doreatha Massed, MD  potassium chloride SA (KLOR-CON M) 20 MEQ tablet Take 1 tablet (20 mEq total) by mouth 2 (two) times daily. 10/04/22   Doreatha Massed, MD  rosuvastatin (CRESTOR) 5 MG tablet Take 1 tablet (5 mg total) by mouth daily. 08/08/22   [name], FNP    Physical Exam: Vitals:   10/13/22 0515 10/13/22 0530 10/13/22 0545 10/13/22 0600  BP: (!) 120/91 (!) 128/94 (!) 110/92 (!) 113/93  Pulse: (!) 101 (!) 101 (!) 101 (!) 101   Resp: 19 (!) 28 (!) 22 (!) 26  Temp:      TempSrc:      SpO2: 94% 94% 94% 96%   Exam  Constitutional: Chronically ill-appearing male in no acute distress Eyes: PERRL, lids and conjunctivae normal ENMT: Mucous membranes are dry.  No visible signs of thrush Neck: normal, supple.  No stridor appreciated. Respiratory: Mildly decreased aeration without significant wheezes or rhonchi appreciated. Cardiovascular: Regular rate and rhythm, no murmurs / rubs / gallops. No extremity edema. 2+ pedal pulses. No carotid bruits.  Abdomen: Distended abdomen with some tenderness to palpation.  Bowel sounds appreciated quadrants. Musculoskeletal: no clubbing / cyanosis. No joint deformity upper and lower extremities. Good ROM, no contractures.  Muscle wasting appreciated.  Skin: no rashes, lesions, ulcers.  Poor skin turgor. Neurologic: CN 2-12 grossly intact.  Patient able to move all extremities. Psychiatric: Normal judgment and insight. Alert and oriented x 3. Normal mood.  Data Reviewed:  EKG revealed sinus tachycardia 104 bpm with short PR interval and prolonged QT interval of 690.  Reviewed labs, imaging, and pertinent records as noted above in HPI.  Assessment and Plan:  Throat discomfort Shortness of breath Possible community-acquired pneumonia Acute.  Patient presented with complaints of feeling like his throat was closing and shortness of breath.  No signs of thrush noted on a.m.  Initial chest x-ray noted diffuse interstitial and groundglass opacities probably increased compared to prior concerning for edema superimposed on interstitial changes seen on prior chest CT.  Unclear at this time if symptoms are secondary to progression of patient's metastatic disease vs. Infection vs. edema. -Admit to a progressive bed -Check procalcitonin(0.63) and BNP(22.3) -Follow-up VQ scan(no signs of a pulmonary embolism) -Check CT scan of the neck( -Empiric antibiotics of Rocephin and doxycycline as  patient is immunocompromised  Hyponatremia Acute on chronic.  Sodium 122 on admission, but appears alert and oriented at this point in time.  Sodium had been 131 discharged on 4/29.   -Check urine sodium and urine osmolarity  Elevated troponin Acute.  Patient did report having some chest discomfort.  High-sensitivity troponins essentially flat. 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 Ellin Saba of oncology.  He completed radiation therapy and medications have been adjusted.  Recent CT scan confirmed progression of disease with left pleural effusion, ascites, and concerns for lymphangitic spread.  Patient was supposed  to start on belzutifan today after getting delivered to his house after  his last appointment with oncology on 4/30.   -May require additional paracentesis -Dr. Ellin Saba of oncology added to treatment team -Consider having palliative care consulted again during this hospitalization  Generalized weakness Patient lives alone and reports having to difficulty getting around. -PT to eval and treat  Prolonged QT interval Acute.  QTc was noted to be 690. -Avoid QT prolonging medications -Correct electrolyte abnormalities  Hypocalcemia Acute.  Calcium noted to be low at 8.5 with ionized calcium 1. -Give 2 g of calcium gluconate IV -Continue to monitor and replace as needed  Essential hypertension Diastolic blood pressures were noted to be elevated 92-106. -Continue amlodipine  Hyperlipidemia -Continue Crestor  Severe protein calorie malnutrition Albumin noted to be significantly low at 1.9.  Patient reported change in taste. -Check prealbumin(7) -Ensure shakes between meals  DVT prophylaxis: Lovenox Advance Care Planning:   Code Status: Full Code    Consults:  None Family Communication: none  Severity of Illness: The appropriate patient status for  this patient is OBSERVATION. Observation status is judged to be reasonable and necessary in order to provide the required intensity of service to ensure the patient's safety. The patient's presenting symptoms, physical exam findings, and initial radiographic and laboratory data in the context of their medical condition is felt to place them at decreased risk for further clinical deterioration. Furthermore, it is anticipated that the patient will be medically stable for discharge from the hospital within 2 midnights of admission.   Author: Clydie Braun, MD 10/13/2022 7:08 AM  For on call review [email or web address].

## 2022-10-13 NOTE — ED Provider Notes (Signed)
[place] EMERGENCY DEPARTMENT AT [facility] Provider Note   CSN: [id number] Arrival date & time: 10/13/22  0305     History  Chief Complaint  Patient presents with   Shortness of Breath    Jacob Reynolds is a 57 y.o. male.  The history is provided by the patient, the EMS personnel and medical records.  Shortness of Breath Jacob Reynolds is a 57 y.o. male who presents to the Emergency Department complaining of shortness of breath.  He reports developing sudden onset difficulty breathing about 1 hour prior to calling EMS.  He feels like his throat is closing.  EMS reports normal respirations on their initial assessment but he did develop stridor on route and received nebulized epinephrine.  He does have a history of metastatic renal cell carcinoma.  He has experienced similar episode of difficulty breathing in the past related to iodine.  No itching, nausea, dizziness, chest pain.  He did just start oral Dilaudid but has been on this medication previously.     Home Medications Prior to Admission medications   Medication Sig Start Date End Date Taking? Authorizing Provider  albuterol (VENTOLIN HFA) 108 (90 Base) MCG/ACT inhaler Inhale 2 puffs into the lungs every 6 (six) hours as needed for wheezing or shortness of breath. 08/08/22   [name], FNP  amLODipine (NORVASC) 10 MG tablet Take 1 tablet (10 mg total) by mouth daily. 09/20/22   Doreatha Massed, MD  belzutifan [facility]) 40 MG tablet Take 3 tablets (120 mg total) by mouth daily. 10/10/22   Doreatha Massed, MD  Budeson-Glycopyrrol-Formoterol (BREZTRI AEROSPHERE) 160-9-4.8 MCG/ACT AERO Inhale 2 puffs into the lungs 2 (two) times daily. 08/08/22   [name], FNP  HYDROcodone-acetaminophen (NORCO) 10-325 MG tablet Take 1 tablet by mouth every 12 (twelve) hours as needed. Patient taking differently: Take 1 tablet by mouth every 12 (twelve) hours as needed for moderate pain or severe pain. 09/05/22    Doreatha Massed, MD  HYDROmorphone (DILAUDID) 2 MG tablet Take 1 tablet (2 mg total) by mouth every 6 (six) hours as needed for severe pain. 10/11/22   Doreatha Massed, MD  loperamide (IMODIUM) 2 MG capsule Take 1 capsule (2 mg total) by mouth as needed for diarrhea or loose stools. 08/26/22   Carnella Guadalajara, PA-C  magnesium oxide (MAG-OX) 400 (240 Mg) MG tablet Take 1 tablet (400 mg total) by mouth daily. 10/04/22   Doreatha Massed, MD  ondansetron (ZOFRAN-ODT) 8 MG disintegrating tablet Take 1 tablet (8 mg total) by mouth every 8 (eight) hours as needed for nausea or vomiting. 09/20/22   Doreatha Massed, MD  potassium chloride SA (KLOR-CON M) 20 MEQ tablet Take 1 tablet (20 mEq total) by mouth 2 (two) times daily. 10/04/22   Doreatha Massed, MD  rosuvastatin (CRESTOR) 5 MG tablet Take 1 tablet (5 mg total) by mouth daily. 08/08/22   [name], FNP      Allergies    Iodinated contrast media and Iodine    Review of Systems   Review of Systems  Respiratory:  Positive for shortness of breath.   All other systems reviewed and are negative.   Physical Exam Updated Vital Signs BP (!) 113/93   Pulse (!) 101   Temp 97.9 F (36.6 C) (Axillary)   Resp (!) 26   SpO2 96%  Physical Exam Vitals and nursing note reviewed.  Constitutional:      Appearance: He is well-developed.  HENT:  Head: Normocephalic and atraumatic.  Cardiovascular:     Rate and Rhythm: Normal rate and regular rhythm.     Heart sounds: No murmur heard. Pulmonary:     Effort: Pulmonary effort is normal. No respiratory distress.     Breath sounds: Normal breath sounds.  Abdominal:     General: There is distension.     Palpations: Abdomen is soft.     Tenderness: There is no abdominal tenderness. There is no guarding or rebound.  Musculoskeletal:        General: No swelling or tenderness.  Skin:    General: Skin is warm and dry.  Neurological:     Mental Status: He is alert and  oriented to person, place, and time.  Psychiatric:        Behavior: Behavior normal.     ED Results / Procedures / Treatments   Labs (all labs ordered are listed, but only abnormal results are displayed) Labs Reviewed  COMPREHENSIVE METABOLIC PANEL - Abnormal; Notable for the following components:      Result Value   Sodium 122 (*)    Chloride 89 (*)    CO2 19 (*)    Glucose, Bld 120 (*)    BUN 22 (*)    Calcium 8.5 (*)    Total Protein 6.0 (*)    Albumin 1.9 (*)    Alkaline Phosphatase 162 (*)    All other components within normal limits  CBC WITH DIFFERENTIAL/PLATELET - Abnormal; Notable for the following components:   RDW 25.2 (*)    Lymphs Abs 0.6 (*)    All other components within normal limits  I-STAT CHEM 8, ED - Abnormal; Notable for the following components:   Sodium 123 (*)    Chloride 94 (*)    BUN 25 (*)    Glucose, Bld 121 (*)    Calcium, Ion 0.98 (*)    All other components within normal limits  I-STAT VENOUS BLOOD GAS, ED - Abnormal; Notable for the following components:   pH, Ven 7.540 (*)    pCO2, Ven 27.0 (*)    Sodium 123 (*)    Calcium, Ion 1.00 (*)    All other components within normal limits  TROPONIN I (HIGH SENSITIVITY) - Abnormal; Notable for the following components:   Troponin I (High Sensitivity) 24 (*)    All other components within normal limits  TROPONIN I (HIGH SENSITIVITY) - Abnormal; Notable for the following components:   Troponin I (High Sensitivity) 24 (*)    All other components within normal limits    EKG None  Radiology DG Neck Soft Tissue  Result Date: 10/13/2022 CLINICAL DATA:  Sore throat EXAM: NECK SOFT TISSUES - 1+ VIEW COMPARISON:  None Available. FINDINGS: There is no evidence of retropharyngeal soft tissue swelling or epiglottic enlargement. The cervical airway is unremarkable and no radio-opaque foreign body identified. Moderate degenerative changes C5-C6 and C6-C7 IMPRESSION: Negative. Electronically Signed   By:  Jasmine Pang M.D.   On: 10/13/2022 03:37   DG Chest Port 1 View  Result Date: 10/13/2022 CLINICAL DATA:  Shortness of breath EXAM: PORTABLE CHEST 1 VIEW COMPARISON:  10/09/2022 CT 10/08/2022 FINDINGS: Right-sided central venous port tip over the right atrium. Small left-sided pleural effusion. Diffuse interstitial and ground-glass opacity, mildly increased compared to prior, could represent edema superimposed on interstitial changes seen on the prior chest CT. Stable cardiomediastinal silhouette. No pneumothorax IMPRESSION: Diffuse interstitial and ground-glass opacity, mildly increased compared to prior, could represent edema superimposed  on interstitial changes seen on prior chest CT (possibility of lymphangitic disease raised). Small left effusion. Electronically Signed   By: Jasmine Pang M.D.   On: 10/13/2022 03:37    Procedures Procedures    Medications Ordered in ED Medications  EPINEPHrine (EPI-PEN) injection 0.3 mg (has no administration in time range)  albuterol (PROVENTIL) (2.5 MG/3ML) 0.083% nebulizer solution 2.5 mg (2.5 mg Nebulization Given 10/13/22 2440)    ED Course/ Medical Decision Making/ A&P                             Medical Decision Making Amount and/or Complexity of Data Reviewed Labs: ordered. Radiology: ordered.  Risk Prescription drug management.   Patient with history of metastatic renal cell carcinoma here for evaluation of shortness of breath.  Initially patient felt like his throat was closing but he had no stridor on examination, no edema of his oropharynx.  His sensation of throat closing resolved but he did have persistent tachypnea and shortness of breath.  He is mildly tachycardic.  EKG is abnormal but similar when compared to priors and he has 2 mildly elevated but flat troponins.  Given his contrast allergy of anaphylaxis unable to obtain CTA.  Plan to obtain VQ scan to rule out PE.  Patient does have pain on exertion during his ED stay.  Medicine  consulted for observation given his elevated troponins and exertional chest pain.        Final Clinical Impression(s) / ED Diagnoses Final diagnoses:  None    Rx / DC Orders ED Discharge Orders     None         Tilden Fossa, MD 10/13/22 [phone]

## 2022-10-13 NOTE — Progress Notes (Signed)
Prior-To-Admission Oral Chemotherapy for Treatment of Oncologic Disease   Order noted from Dr. Madelyn Flavors to continue prior-to-admission oral chemotherapy regimen of belzutifan.  Procedure Per Pharmacy & Therapeutics Committee Policy: Orders for continuation of home oral chemotherapy for treatment of an oncologic disease will be held unless approved by an oncologist during current admission.    For patients receiving oncology care at [facility], inpatient pharmacist contacts patient's oncologist during regular office hours to review. If earlier review is medically necessary, attending physician consults [facility] on-call oncologist   For patients receiving oncology care outside of [facility], attending physician consults patient's oncologist to review. If this oncologist or their coverage cannot be reached, attending physician consults [facility] on-call oncologist   Oral chemotherapy continuation order is on hold pending oncologist review, [facility] oncologist Marice Potter will be notified by inpatient pharmacy during office hours   Update 5/2 - Per discussion with Dr. Ellin Saba, hold belzutifan while inpatient (patient hasn't actually started med yet, states was supposed to be delivered to his house 5/2). Inpatient belzutifan d/c'd.   Leia Alf, PharmD, BCPS Please check AMION for all [facility] contact numbers Clinical Pharmacist 10/13/2022 8:38 AM

## 2022-10-13 NOTE — ED Notes (Signed)
ED TO INPATIENT HANDOFF REPORT  ED Nurse Name and Phone #: Brett Canales [phone]  S Name/Age/Gender Malen Gauze 57 y.o. male Room/Bed: 007C/007C  Code Status   Code Status: Full Code  Home/SNF/Other Home Patient oriented to: self, place, time, and situation Is this baseline? Yes   Triage Complete: Triage complete  Chief Complaint SOB (shortness of breath) [R06.02] Hyponatremia [E87.1]  Triage Note Patient arrived with EMS from home reports  "throat closing"with SOB this evening , received Epi nebulizer treatment prior to arrival with mild relief by EMS. No cough or fever . Patient suspects allergic reaction , feels similar to allergic reaction in the past.    Allergies Allergies  Allergen Reactions   Iodinated Contrast Media Anaphylaxis    Throat felt like closed Was in hospital for allergy and almost died- per patient  Pt verified that it was from CT contrast   Iodine     Throat felt like closed Was in hospital for allergy and almost died- per patient     Level of Care/Admitting Diagnosis ED Disposition     ED Disposition  Admit   Condition  --   Comment  Hospital Area: [facility] [[id number]]  Level of Care: Progressive [102]  Admit to Progressive based on following criteria: NEPHROLOGY stable condition requiring close monitoring for AKI, requiring Hemodialysis or Peritoneal Dialysis either from expected electrolyte imbalance, acidosis, or fluid overload that can be managed by NIPPV or high flow oxygen.  May place patient in observation at [facility] or [facility] [place] if equivalent level of care is available:: No  Covid Evaluation: Asymptomatic - no recent exposure (last 10 days) testing not required  Diagnosis: Hyponatremia [198519]  Admitting Physician: Clydie Braun [[id number]]  Attending Physician: Clydie Braun [[id number]]          B Medical/Surgery History Past Medical History:  Diagnosis Date   Cancer (HCC)    Plantar fasciitis     Port-A-Cath in place 11/10/2021   Past Surgical History:  Procedure Laterality Date   IR IMAGING GUIDED PORT INSERTION  10/15/2021     A IV Location/Drains/Wounds Patient Lines/Drains/Airways Status     Active Line/Drains/Airways     Name Placement date Placement time Site Days   Implanted Port 10/15/21 Right Chest 10/15/21  1102  Chest  363   Peripheral IV 10/13/22 18 G Left Antecubital 10/13/22  --  Antecubital  less than 1            Intake/Output Last 24 hours No intake or output data in the 24 hours ending 10/13/22 1157  Labs/Imaging Results for orders placed or performed during the hospital encounter of 10/13/22 (from the past 48 hour(s))  Comprehensive metabolic panel     Status: Abnormal   Collection Time: 10/13/22  3:10 AM  Result Value Ref Range   Sodium 122 (L) 135 - 145 mmol/L   Potassium 5.0 3.5 - 5.1 mmol/L   Chloride 89 (L) 98 - 111 mmol/L   CO2 19 (L) 22 - 32 mmol/L   Glucose, Bld 120 (H) 70 - 99 mg/dL    Comment: Glucose reference range applies only to samples taken after fasting for at least 8 hours.   BUN 22 (H) 6 - 20 mg/dL   Creatinine, Ser 7.84 0.61 - 1.24 mg/dL   Calcium 8.5 (L) 8.9 - 10.3 mg/dL   Total Protein 6.0 (L) 6.5 - 8.1 g/dL   Albumin 1.9 (L) 3.5 - 5.0 g/dL   AST 41  15 - 41 U/L   ALT 24 0 - 44 U/L   Alkaline Phosphatase 162 (H) 38 - 126 U/L   Total Bilirubin 0.7 0.3 - 1.2 mg/dL   GFR, Estimated >16 >10 mL/min    Comment: (NOTE) Calculated using the CKD-EPI Creatinine Equation (2021)    Anion gap 14 5 - 15    Comment: Performed at [facility], [address], [place], [place] [address]  CBC with Differential     Status: Abnormal   Collection Time: 10/13/22  3:10 AM  Result Value Ref Range   WBC 7.4 4.0 - 10.5 K/uL   RBC 5.26 4.22 - 5.81 MIL/uL   Hemoglobin 14.6 13.0 - 17.0 g/dL   HCT 40.9 81.1 - 91.4 %   MCV 80.8 80.0 - 100.0 fL   MCH 27.8 26.0 - 34.0 pg   MCHC 34.4 30.0 - 36.0 g/dL   RDW 78.2 (H) 95.6 - 21.3 %    Platelets 190 150 - 400 K/uL   nRBC 0.0 0.0 - 0.2 %   Neutrophils Relative % 82 %   Neutro Abs 6.1 1.7 - 7.7 K/uL   Lymphocytes Relative 8 %   Lymphs Abs 0.6 (L) 0.7 - 4.0 K/uL   Monocytes Relative 7 %   Monocytes Absolute 0.5 0.1 - 1.0 K/uL   Eosinophils Relative 2 %   Eosinophils Absolute 0.1 0.0 - 0.5 K/uL   Basophils Relative 1 %   Basophils Absolute 0.1 0.0 - 0.1 K/uL   nRBC 0 0 /100 WBC   Abs Immature Granulocytes 0.00 0.00 - 0.07 K/uL   Burr Cells PRESENT     Comment: Performed at [facility], [address], [place], [place] [address]  Troponin I (High Sensitivity)     Status: Abnormal   Collection Time: 10/13/22  3:10 AM  Result Value Ref Range   Troponin I (High Sensitivity) 24 (H) <18 ng/L    Comment: (NOTE) Elevated high sensitivity troponin I (hsTnI) values and significant  changes across serial measurements may suggest ACS but many other  chronic and acute conditions are known to elevate hsTnI results.  Refer to the "Links" section for chest pain algorithms and additional  guidance. Performed at [facility], [address], [place], [place] [address]   I-stat chem 8, ED     Status: Abnormal   Collection Time: 10/13/22  3:16 AM  Result Value Ref Range   Sodium 123 (L) 135 - 145 mmol/L   Potassium 5.0 3.5 - 5.1 mmol/L   Chloride 94 (L) 98 - 111 mmol/L   BUN 25 (H) 6 - 20 mg/dL   Creatinine, Ser 2.95 0.61 - 1.24 mg/dL   Glucose, Bld 284 (H) 70 - 99 mg/dL    Comment: Glucose reference range applies only to samples taken after fasting for at least 8 hours.   Calcium, Ion 0.98 (L) 1.15 - 1.40 mmol/L   TCO2 22 22 - 32 mmol/L   Hemoglobin 15.3 13.0 - 17.0 g/dL   HCT 13.2 44.0 - 10.2 %  I-Stat venous blood gas, ED     Status: Abnormal   Collection Time: 10/13/22  3:16 AM  Result Value Ref Range   pH, Ven 7.540 (H) 7.25 - 7.43   pCO2, Ven 27.0 (L) 44 - 60 mmHg   pO2, Ven 33 32 - 45 mmHg   Bicarbonate 23.1 20.0 - 28.0 mmol/L   TCO2 24 22 - 32 mmol/L    O2  Saturation 74 %   Acid-Base Excess 2.0 0.0 - 2.0 mmol/L   Sodium 123 (L) 135 - 145 mmol/L   Potassium 5.0 3.5 - 5.1 mmol/L   Calcium, Ion 1.00 (L) 1.15 - 1.40 mmol/L   HCT 44.0 39.0 - 52.0 %   Hemoglobin 15.0 13.0 - 17.0 g/dL   Sample type VENOUS    Comment NOTIFIED PHYSICIAN   Troponin I (High Sensitivity)     Status: Abnormal   Collection Time: 10/13/22  5:30 AM  Result Value Ref Range   Troponin I (High Sensitivity) 24 (H) <18 ng/L    Comment: (NOTE) Elevated high sensitivity troponin I (hsTnI) values and significant  changes across serial measurements may suggest ACS but many other  chronic and acute conditions are known to elevate hsTnI results.  Refer to the "Links" section for chest pain algorithms and additional  guidance. Performed at [facility], [address], [place], [place] [address]   Phosphorus     Status: None   Collection Time: 10/13/22  5:30 AM  Result Value Ref Range   Phosphorus 3.3 2.5 - 4.6 mg/dL    Comment: Performed at [facility], [address], [place], [place] [address]  Prealbumin     Status: Abnormal   Collection Time: 10/13/22  5:30 AM  Result Value Ref Range   Prealbumin 7 (L) 18 - 38 mg/dL    Comment: Performed at [facility], [address] [place], [place] [address]  SARS Coronavirus 2 by RT PCR (hospital order, performed in [facility] hospital lab) *cepheid single result test* Anterior Nasal Swab     Status: None   Collection Time: 10/13/22  8:12 AM   Specimen: Anterior Nasal Swab  Result Value Ref Range   SARS Coronavirus 2 by RT PCR NEGATIVE NEGATIVE    Comment: Performed at [facility], [address], [place], [place] [address]  Respiratory (~20 pathogens) panel by PCR     Status: None   Collection Time: 10/13/22  9:05 AM   Specimen: Anterior Nasal Swab; Respiratory  Result Value Ref Range   Adenovirus NOT DETECTED NOT DETECTED   Coronavirus 229E NOT DETECTED NOT DETECTED    Comment: (NOTE) The  Coronavirus on the Respiratory Panel, DOES NOT test for the novel  Coronavirus (2019 nCoV)    Coronavirus HKU1 NOT DETECTED NOT DETECTED   Coronavirus NL63 NOT DETECTED NOT DETECTED   Coronavirus OC43 NOT DETECTED NOT DETECTED   Metapneumovirus NOT DETECTED NOT DETECTED   Rhinovirus / Enterovirus NOT DETECTED NOT DETECTED   Influenza A NOT DETECTED NOT DETECTED   Influenza B NOT DETECTED NOT DETECTED   Parainfluenza Virus 1 NOT DETECTED NOT DETECTED   Parainfluenza Virus 2 NOT DETECTED NOT DETECTED   Parainfluenza Virus 3 NOT DETECTED NOT DETECTED   Parainfluenza Virus 4 NOT DETECTED NOT DETECTED   Respiratory Syncytial Virus NOT DETECTED NOT DETECTED   Bordetella pertussis NOT DETECTED NOT DETECTED   Bordetella Parapertussis NOT DETECTED NOT DETECTED   Chlamydophila pneumoniae NOT DETECTED NOT DETECTED   Mycoplasma pneumoniae NOT DETECTED NOT DETECTED    Comment: Performed at [facility], 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 Ventilation  scans intentionally deferred if perfusion scan and chest x-ray adequate for interpretation during COVID 19 epidemic. RADIOPHARMACEUTICALS:  3.7 mCi Tc-22m MAA IV COMPARISON:  Chest radiograph 10/13/2022 FINDINGS: Slight blunting of the LEFT costophrenic angle by tiny effusion. No segmental or subsegmental perfusion defects identified to suggest pulmonary embolism. IMPRESSION: No evidence of pulmonary embolism. Electronically Signed   By: Ulyses Southward M.D.   On: 10/13/2022 10:18   DG Neck Soft Tissue  Result Date: 10/13/2022 CLINICAL DATA:  Sore throat EXAM: NECK SOFT TISSUES - 1+ VIEW COMPARISON:  None Available. FINDINGS: There  is no evidence of retropharyngeal soft tissue swelling or epiglottic enlargement. The cervical airway is unremarkable and no radio-opaque foreign body identified. Moderate degenerative changes C5-C6 and C6-C7 IMPRESSION: Negative. Electronically Signed   By: Jasmine Pang M.D.   On: 10/13/2022 03:37   DG Chest Port 1 View  Result Date: 10/13/2022 CLINICAL DATA:  Shortness of breath EXAM: PORTABLE CHEST 1 VIEW COMPARISON:  10/09/2022 CT 10/08/2022 FINDINGS: Right-sided central venous port tip over the right atrium. Small left-sided pleural effusion. Diffuse interstitial and ground-glass opacity, mildly increased compared to prior, could represent edema superimposed on interstitial changes seen on the prior chest CT. Stable cardiomediastinal silhouette. No pneumothorax IMPRESSION: Diffuse interstitial and ground-glass opacity, mildly increased compared to prior, could represent edema superimposed on interstitial changes seen on prior chest CT (possibility of lymphangitic disease raised). Small left effusion. Electronically Signed   By: Jasmine Pang M.D.   On: 10/13/2022 03:37    Pending Labs Unresulted Labs (From admission, onward)     Start     Ordered   10/14/22 0500  CBC  Tomorrow morning,   R        10/13/22 0812   10/14/22 0500  Basic metabolic panel  Tomorrow morning,   R        10/13/22 0812   10/14/22 0500  Magnesium  Tomorrow morning,   R        10/13/22 0812   10/13/22 0821  Osmolality, urine  Once,   R        10/13/22 0820   10/13/22 0813  Sodium, urine, random  Once,   R        10/13/22 0820            Vitals/Pain Today's Vitals   10/13/22 0800 10/13/22 0830 10/13/22 1137 10/13/22 1140  BP: (!) 116/93 119/89 (!) 125/98   Pulse: (!) 102 (!) 105 100   Resp: (!) 21 (!) 25 (!) 22   Temp:    97.9 F (36.6 C)  TempSrc:    Oral  SpO2: 93% 96% 98%   PainSc:        Isolation Precautions Airborne and Contact precautions  Medications Medications  EPINEPHrine (EPI-PEN)  injection 0.3 mg (has no administration in time range)  enoxaparin (LOVENOX) injection 40 mg (40 mg Subcutaneous Given 10/13/22 0848)  sodium chloride flush (NS) 0.9 % injection 3 mL (3 mLs Intravenous Not Given 10/13/22 0910)  acetaminophen (TYLENOL) tablet 650 mg (has no administration in time range)    Or  acetaminophen (TYLENOL) suppository 650 mg (has no administration in time range)  albuterol (PROVENTIL) (2.5 MG/3ML) 0.083% nebulizer solution 2.5 mg (has no administration in time range)  lidocaine (LIDODERM) 5 % 2 patch (has no administration in time range)  HYDROmorphone (DILAUDID) tablet 2 mg (has no administration in time range)  rosuvastatin (CRESTOR) tablet 5 mg (has no administration in time range)  amLODipine (NORVASC) tablet  10 mg (has no administration in time range)  loperamide (IMODIUM) capsule 2 mg (has no administration in time range)  feeding supplement (ENSURE ENLIVE / ENSURE PLUS) liquid 237 mL (has no administration in time range)  albuterol (PROVENTIL) (2.5 MG/3ML) 0.083% nebulizer solution 2.5 mg (2.5 mg Nebulization Given 10/13/22 0658)  technetium albumin aggregated (MAA) injection solution 3.7 millicurie (3.7 millicuries Intravenous Contrast Given 10/13/22 0955)    Mobility walks     Focused Assessments Pulmonary Assessment Handoff:  Lung sounds: Bilateral Breath Sounds: Clear L Breath Sounds: Fine crackles R Breath Sounds: Fine crackles O2 Device: Room Air      R Recommendations: See Admitting Provider Note  Report given to:   Additional Notes:

## 2022-10-14 ENCOUNTER — Inpatient Hospital Stay (HOSPITAL_COMMUNITY): Payer: 59

## 2022-10-14 DIAGNOSIS — N179 Acute kidney failure, unspecified: Secondary | ICD-10-CM | POA: Diagnosis not present

## 2022-10-14 DIAGNOSIS — R7989 Other specified abnormal findings of blood chemistry: Secondary | ICD-10-CM | POA: Diagnosis not present

## 2022-10-14 DIAGNOSIS — R0602 Shortness of breath: Secondary | ICD-10-CM

## 2022-10-14 DIAGNOSIS — D849 Immunodeficiency, unspecified: Secondary | ICD-10-CM | POA: Diagnosis not present

## 2022-10-14 DIAGNOSIS — I959 Hypotension, unspecified: Secondary | ICD-10-CM | POA: Diagnosis not present

## 2022-10-14 DIAGNOSIS — R112 Nausea with vomiting, unspecified: Secondary | ICD-10-CM | POA: Diagnosis not present

## 2022-10-14 DIAGNOSIS — R0609 Other forms of dyspnea: Secondary | ICD-10-CM | POA: Diagnosis not present

## 2022-10-14 DIAGNOSIS — R64 Cachexia: Secondary | ICD-10-CM | POA: Diagnosis not present

## 2022-10-14 DIAGNOSIS — R627 Adult failure to thrive: Secondary | ICD-10-CM | POA: Diagnosis not present

## 2022-10-14 DIAGNOSIS — R531 Weakness: Secondary | ICD-10-CM | POA: Diagnosis not present

## 2022-10-14 DIAGNOSIS — E875 Hyperkalemia: Secondary | ICD-10-CM | POA: Diagnosis not present

## 2022-10-14 DIAGNOSIS — I471 Supraventricular tachycardia, unspecified: Secondary | ICD-10-CM | POA: Diagnosis not present

## 2022-10-14 DIAGNOSIS — E785 Hyperlipidemia, unspecified: Secondary | ICD-10-CM | POA: Diagnosis not present

## 2022-10-14 DIAGNOSIS — F17210 Nicotine dependence, cigarettes, uncomplicated: Secondary | ICD-10-CM | POA: Diagnosis not present

## 2022-10-14 DIAGNOSIS — E222 Syndrome of inappropriate secretion of antidiuretic hormone: Secondary | ICD-10-CM | POA: Diagnosis not present

## 2022-10-14 DIAGNOSIS — Z515 Encounter for palliative care: Secondary | ICD-10-CM | POA: Diagnosis not present

## 2022-10-14 DIAGNOSIS — C649 Malignant neoplasm of unspecified kidney, except renal pelvis: Secondary | ICD-10-CM | POA: Diagnosis not present

## 2022-10-14 DIAGNOSIS — Z1152 Encounter for screening for COVID-19: Secondary | ICD-10-CM | POA: Diagnosis not present

## 2022-10-14 DIAGNOSIS — C7900 Secondary malignant neoplasm of unspecified kidney and renal pelvis: Secondary | ICD-10-CM | POA: Diagnosis not present

## 2022-10-14 DIAGNOSIS — I10 Essential (primary) hypertension: Secondary | ICD-10-CM | POA: Diagnosis not present

## 2022-10-14 DIAGNOSIS — Z7189 Other specified counseling: Secondary | ICD-10-CM | POA: Diagnosis not present

## 2022-10-14 DIAGNOSIS — I429 Cardiomyopathy, unspecified: Secondary | ICD-10-CM | POA: Diagnosis not present

## 2022-10-14 DIAGNOSIS — E43 Unspecified severe protein-calorie malnutrition: Secondary | ICD-10-CM | POA: Diagnosis not present

## 2022-10-14 DIAGNOSIS — Z66 Do not resuscitate: Secondary | ICD-10-CM | POA: Diagnosis not present

## 2022-10-14 DIAGNOSIS — E871 Hypo-osmolality and hyponatremia: Secondary | ICD-10-CM | POA: Diagnosis not present

## 2022-10-14 DIAGNOSIS — E8809 Other disorders of plasma-protein metabolism, not elsewhere classified: Secondary | ICD-10-CM | POA: Diagnosis not present

## 2022-10-14 DIAGNOSIS — R180 Malignant ascites: Secondary | ICD-10-CM | POA: Diagnosis not present

## 2022-10-14 DIAGNOSIS — J189 Pneumonia, unspecified organism: Secondary | ICD-10-CM | POA: Diagnosis not present

## 2022-10-14 DIAGNOSIS — N1330 Unspecified hydronephrosis: Secondary | ICD-10-CM | POA: Diagnosis not present

## 2022-10-14 DIAGNOSIS — C7951 Secondary malignant neoplasm of bone: Secondary | ICD-10-CM | POA: Diagnosis not present

## 2022-10-14 DIAGNOSIS — J9601 Acute respiratory failure with hypoxia: Secondary | ICD-10-CM | POA: Diagnosis not present

## 2022-10-14 DIAGNOSIS — I110 Hypertensive heart disease with heart failure: Secondary | ICD-10-CM | POA: Diagnosis not present

## 2022-10-14 DIAGNOSIS — J910 Malignant pleural effusion: Secondary | ICD-10-CM | POA: Diagnosis not present

## 2022-10-14 DIAGNOSIS — R188 Other ascites: Secondary | ICD-10-CM | POA: Diagnosis not present

## 2022-10-14 DIAGNOSIS — J90 Pleural effusion, not elsewhere classified: Secondary | ICD-10-CM | POA: Diagnosis not present

## 2022-10-14 DIAGNOSIS — I509 Heart failure, unspecified: Secondary | ICD-10-CM | POA: Diagnosis not present

## 2022-10-14 DIAGNOSIS — I5020 Unspecified systolic (congestive) heart failure: Secondary | ICD-10-CM | POA: Diagnosis not present

## 2022-10-14 DIAGNOSIS — R9431 Abnormal electrocardiogram [ECG] [EKG]: Secondary | ICD-10-CM | POA: Diagnosis not present

## 2022-10-14 HISTORY — PX: IR PARACENTESIS: IMG2679

## 2022-10-14 LAB — CBC
HCT: 39 % (ref 39.0–52.0)
Hemoglobin: 13.2 g/dL (ref 13.0–17.0)
MCH: 27.3 pg (ref 26.0–34.0)
MCHC: 33.8 g/dL (ref 30.0–36.0)
MCV: 80.7 fL (ref 80.0–100.0)
Platelets: 199 10*3/uL (ref 150–400)
RBC: 4.83 MIL/uL (ref 4.22–5.81)
RDW: 25 % — ABNORMAL HIGH (ref 11.5–15.5)
WBC: 6.3 10*3/uL (ref 4.0–10.5)
nRBC: 0 % (ref 0.0–0.2)

## 2022-10-14 LAB — BASIC METABOLIC PANEL
Anion gap: 13 (ref 5–15)
BUN: 22 mg/dL — ABNORMAL HIGH (ref 6–20)
CO2: 20 mmol/L — ABNORMAL LOW (ref 22–32)
Calcium: 8.2 mg/dL — ABNORMAL LOW (ref 8.9–10.3)
Chloride: 92 mmol/L — ABNORMAL LOW (ref 98–111)
Creatinine, Ser: 0.85 mg/dL (ref 0.61–1.24)
GFR, Estimated: >60 mL/min (ref >60)
Glucose, Bld: 124 mg/dL — ABNORMAL HIGH (ref 70–99)
Potassium: 4.8 mmol/L (ref 3.5–5.1)
Sodium: 125 mmol/L — ABNORMAL LOW (ref 135–145)

## 2022-10-14 LAB — GLUCOSE, PLEURAL OR PERITONEAL FLUID: Glucose, Fluid: 87 mg/dL

## 2022-10-14 LAB — ALBUMIN, PLEURAL OR PERITONEAL FLUID: Albumin, Fluid: 1.6 g/dL

## 2022-10-14 LAB — PROTEIN, PLEURAL OR PERITONEAL FLUID: Total protein, fluid: 3.9 g/dL

## 2022-10-14 LAB — MAGNESIUM: Magnesium: 1.8 mg/dL (ref 1.7–2.4)

## 2022-10-14 LAB — LACTATE DEHYDROGENASE, PLEURAL OR PERITONEAL FLUID: LD, Fluid: 1169 U/L — ABNORMAL HIGH (ref 3–23)

## 2022-10-14 LAB — BODY FLUID CULTURE W GRAM STAIN

## 2022-10-14 MED ORDER — DOXYCYCLINE HYCLATE 100 MG PO TABS
100.0000 mg | ORAL_TABLET | Freq: Two times a day (BID) | ORAL | Status: AC
  Administered 2022-10-14 – 2022-10-18 (×8): 100 mg via ORAL
  Filled 2022-10-14 (×8): qty 1

## 2022-10-14 MED ORDER — LIDOCAINE HCL 1 % IJ SOLN
INTRAMUSCULAR | Status: AC
  Filled 2022-10-14: qty 20

## 2022-10-14 MED ORDER — FLUTICASONE FUROATE-VILANTEROL 200-25 MCG/ACT IN AEPB
1.0000 | INHALATION_SPRAY | Freq: Every day | RESPIRATORY_TRACT | Status: DC
  Administered 2022-10-15 – 2022-10-28 (×13): 1 via RESPIRATORY_TRACT
  Filled 2022-10-14: qty 28

## 2022-10-14 MED ORDER — CHLORHEXIDINE GLUCONATE CLOTH 2 % EX PADS
6.0000 | MEDICATED_PAD | Freq: Every day | CUTANEOUS | Status: DC
  Administered 2022-10-14 – 2022-10-28 (×15): 6 via TOPICAL

## 2022-10-14 MED ORDER — ALBUMIN HUMAN 25 % IV SOLN
25.0000 g | Freq: Four times a day (QID) | INTRAVENOUS | Status: AC
  Administered 2022-10-14 (×2): 25 g via INTRAVENOUS
  Filled 2022-10-14 (×2): qty 100

## 2022-10-14 MED ORDER — ALBUMIN HUMAN 25 % IV SOLN: 25.0000 g | Freq: Four times a day (QID) | INTRAVENOUS | Status: DC

## 2022-10-14 MED ORDER — SODIUM CHLORIDE 0.9% FLUSH
10.0000 mL | Freq: Two times a day (BID) | INTRAVENOUS | Status: DC
  Administered 2022-10-14: 20 mL
  Administered 2022-10-15: 30 mL
  Administered 2022-10-15 – 2022-10-28 (×21): 10 mL
  Administered 2022-10-29: 20 mL

## 2022-10-14 MED ORDER — MAGNESIUM OXIDE -MG SUPPLEMENT 400 (240 MG) MG PO TABS
400.0000 mg | ORAL_TABLET | Freq: Every day | ORAL | Status: DC
  Administered 2022-10-14 – 2022-10-28 (×15): 400 mg via ORAL
  Filled 2022-10-14 (×15): qty 1

## 2022-10-14 MED ORDER — SODIUM CHLORIDE 0.9% FLUSH
10.0000 mL | INTRAVENOUS | Status: DC | PRN
  Administered 2022-10-25 – 2022-10-27 (×2): 10 mL

## 2022-10-14 MED ORDER — UMECLIDINIUM BROMIDE 62.5 MCG/ACT IN AEPB
1.0000 | INHALATION_SPRAY | Freq: Every day | RESPIRATORY_TRACT | Status: DC
  Administered 2022-10-15 – 2022-10-28 (×13): 1 via RESPIRATORY_TRACT
  Filled 2022-10-14 (×2): qty 7

## 2022-10-14 NOTE — Evaluation (Signed)
Physical Therapy Evaluation Patient Details Name: Jacob Reynolds MRN: [id number] DOB: Feb 14, 1966 Today's Date: 10/14/2022  History of Present Illness  Pt is 57 year old presented to [facility] on  10/13/22 for SOB and feeling of throat closing. Possible PNA.  Parencentesis of 6.1L on admission. 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 Will work on home exercise program and pt could benefit from HHPT to maximize his functional activity.        Recommendations for follow up therapy are one component of a multi-disciplinary discharge planning process, led by the attending physician.  Recommendations may be updated based on patient status, additional functional criteria and insurance authorization.  Follow Up Recommendations       Assistance Recommended at Discharge PRN  Patient can return home with the following  Assist for transportation    Equipment Recommendations None recommended by PT  Recommendations for Other Services       Functional Status Assessment Patient has had a recent decline in their functional status and demonstrates the ability to make significant improvements in function in a reasonable and predictable amount of time.     Precautions / Restrictions Precautions Precautions: None      Mobility  Bed Mobility Overal bed mobility: Modified Independent                  Transfers Overall transfer level: Modified independent Equipment used: None                    Ambulation/Gait Ambulation/Gait assistance: Modified independent (Device/Increase time) Gait Distance (Feet): 350 Feet Assistive device: None Gait Pattern/deviations: Step-through pattern, Decreased stride length, Wide base of support Gait velocity: decr Gait velocity interpretation: 1.31 - 2.62 ft/sec, indicative of limited community ambulator   General  Gait Details: Steady gait  Stairs            Wheelchair Mobility    Modified Rankin (Stroke Patients Only)       Balance Overall balance assessment: Mild deficits observed, not formally tested                                           Pertinent Vitals/Pain Pain Assessment Pain Assessment: Faces Faces Pain Scale: Hurts little more Pain Location: back Pain Descriptors / Indicators: Grimacing Pain Intervention(s): Limited activity within patient's tolerance, Monitored during session, Repositioned    Home Living Family/patient expects to be discharged to:: Private residence Living Arrangements: Alone   Type of Home: House Home Access: Stairs to enter   Secretary/administrator of Steps: 1-2   Home Layout: One level Home Equipment: None Additional Comments: Friend is going to let him borrow a cane if needed    Prior Function Prior Level of Function : Independent/Modified Independent             Mobility Comments: No assistive device. ADLs Comments: No longer drives     Hand Dominance        Extremity/Trunk Assessment   Upper Extremity Assessment Upper Extremity Assessment: Overall WFL for tasks assessed    Lower Extremity Assessment Lower Extremity Assessment: Generalized weakness       Communication   Communication: No difficulties  Cognition Arousal/Alertness: Awake/alert Behavior During Therapy: WFL for tasks assessed/performed Overall Cognitive Status: Within  Functional Limits for tasks assessed                                          General Comments General comments (skin integrity, edema, etc.): VSS on RA    Exercises     Assessment/Plan    PT Assessment Patient needs continued PT services  PT Problem List Decreased strength;Decreased mobility       PT Treatment Interventions Patient/family education;Therapeutic exercise;Therapeutic activities;Gait training;Stair training    PT Goals  (Current goals can be found in the Care Plan section)  Acute Rehab PT Goals Patient Stated Goal: get stronger PT Goal Formulation: With patient Time For Goal Achievement: 10/21/22 Potential to Achieve Goals: Good    Frequency Min 3X/week     Co-evaluation               AM-PAC PT "6 Clicks" Mobility  Outcome Measure Help needed turning from your back to your side while in a flat bed without using bedrails?: None Help needed moving from lying on your back to sitting on the side of a flat bed without using bedrails?: None Help needed moving to and from a bed to a chair (including a wheelchair)?: None Help needed standing up from a chair using your arms (e.g., wheelchair or bedside chair)?: None Help needed to walk in hospital room?: None Help needed climbing 3-5 steps with a railing? : A Little 6 Click Score: 23    End of Session   Activity Tolerance: Patient tolerated treatment well Patient left: in chair;with call bell/phone within reach   PT Visit Diagnosis: Muscle weakness (generalized) (M62.81)    Time: 1210-1226 PT Time Calculation (min) (ACUTE ONLY): 16 min   Charges:   PT Evaluation $PT Eval Low Complexity: 1 Low          [facility] PT Acute Rehabilitation Services Office [phone]   Angelina Ok [facility] 10/14/2022, 2:02 PM

## 2022-10-14 NOTE — Progress Notes (Signed)
Jacob Reynolds   DOB:[birth year]   ZO#:[id number]   JWJ#:[id number]  Hem/Onc   Subjective: Pt is known to our service, under care of Dr. Ellin Saba for metastatic renal cell carcinoma to bones he has progressed on first-line immunotherapy Opdivo and Yervoy, and recently progressed on second line cabozantinib.  Dr. Kirtland Bouchard has recommended third line therapy with  Belzutifan, which he was going to start today.  He presented to hospital with dysphagia and slightly worsening dyspnea on exertion, and abdominal bloating.  He has developed large-volume ascites in the left side pleural effusion lately, status post 2 paracentesis, the second one was today.  He has lost a significant amount of weight, overall feel quite fatigued.  He lives alone, he has been getting difficult to take care of self at home.   Objective:  Vitals:   10/14/22 0400 10/14/22 1047  BP: 103/77 (!) 125/99  Pulse: 97 100  Resp: (!) 21 20  Temp:  97.7 F (36.5 C)  SpO2: 91% 92%    Body mass index is 20.66 kg/m.  Intake/Output Summary (Last 24 hours) at 10/14/2022 1828 Last data filed at 10/14/2022 1700 Gross per 24 hour  Intake 613.2 ml  Output --  Net 613.2 ml     Sclerae unicteric, cachectic appearing  Oropharynx clear  No peripheral adenopathy  Lungs clear, decreased breath sounds on left side  Heart regular rate and rhythm  Abdomen soft, slightly distended  MSK no focal spinal tenderness, no peripheral edema  Neuro nonfocal    CBG (last 3)  No results for input(s): "GLUCAP" in the last 72 hours.   Labs:  Urine Studies No results for input(s): "UHGB", "CRYS" in the last 72 hours.  Invalid input(s): "UACOL", "UAPR", "USPG", "UPH", "UTP", "UGL", "UKET", "UBIL", "UNIT", "UROB", "ULEU", "UEPI", "UWBC", "URBC", "UBAC", "CAST", "UCOM", "BILUA"  Basic Metabolic Panel: Recent Labs  Lab 10/08/22 0240 10/09/22 0211 10/10/22 0848 10/13/22 0310 10/13/22 0316 10/13/22 0530 10/14/22 0023  NA 129* 128* 130* 122* 123*   123*  --  125*  K 4.9 4.0 4.5 5.0 5.0  5.0  --  4.8  CL 98 95* 96* 89* 94*  --  92*  CO2 21* 22 22 19*  --   --  20*  GLUCOSE 129* 92 130* 120* 121*  --  124*  BUN 19 15 21* 22* 25*  --  22*  CREATININE 1.03 1.14 1.23 1.23 1.20  --  0.85  CALCIUM 8.7* 8.0* 8.2* 8.5*  --   --  8.2*  MG 1.9  --   --   --   --   --  1.8  PHOS  --   --   --   --   --  3.3  --    GFR Estimated Creatinine Clearance: 92.2 mL/min (by C-G formula based on SCr of 0.85 mg/dL). Liver Function Tests: Recent Labs  Lab 10/08/22 0240 10/09/22 0211 10/13/22 0310  AST 40 27 41  ALT 38 24 24  ALKPHOS 119 86 162*  BILITOT 0.7 0.9 0.7  PROT 7.3 5.8* 6.0*  ALBUMIN 2.5* 2.2* 1.9*   Recent Labs  Lab 10/08/22 0240  LIPASE 25   No results for input(s): "AMMONIA" in the last 168 hours. Coagulation profile Recent Labs  Lab 10/09/22 0211  INR 1.2    CBC: Recent Labs  Lab 10/08/22 0240 10/09/22 0211 10/13/22 0310 10/13/22 0316 10/14/22 0023  WBC 6.1 6.3 7.4  --  6.3  NEUTROABS  --   --  6.1  --   --   HGB 14.6 13.3 14.6 15.3  15.0 13.2  HCT 43.5 39.1 42.5 45.0  44.0 39.0  MCV 80.9 80.5 80.8  --  80.7  PLT 221 200 190  --  199   Cardiac Enzymes: No results for input(s): "CKTOTAL", "CKMB", "CKMBINDEX", "TROPONINI" in the last 168 hours. BNP: Invalid input(s): "POCBNP" CBG: No results for input(s): "GLUCAP" in the last 168 hours. D-Dimer No results for input(s): "DDIMER" in the last 72 hours. Hgb A1c No results for input(s): "HGBA1C" in the last 72 hours. Lipid Profile No results for input(s): "CHOL", "HDL", "LDLCALC", "TRIG", "CHOLHDL", "LDLDIRECT" in the last 72 hours. Thyroid function studies No results for input(s): "TSH", "T4TOTAL", "T3FREE", "THYROIDAB" in the last 72 hours.  Invalid input(s): "FREET3" Anemia work up No results for input(s): "VITAMINB12", "FOLATE", "FERRITIN", "TIBC", "IRON", "RETICCTPCT" in the last 72 hours. Microbiology Recent Results (from the past 240 hour(s))   Body fluid culture w Gram Stain     Status: None   Collection Time: 10/08/22  4:08 PM   Specimen: PATH Cytology Peritoneal fluid  Result Value Ref Range Status   Specimen Description   Final    PERITONEAL Performed at [facility], [address] [place], [place] [address]    Special Requests   Final    NONE Performed at [facility], [address], [place], [place] [address]    Gram Stain   Final    RARE WBC PRESENT, PREDOMINANTLY PMN NO ORGANISMS SEEN    Culture   Final    NO GROWTH 3 DAYS Performed at [facility], [address], [place], [place] [address]    Report Status 10/11/2022 FINAL  Final  Body fluid culture w Gram Stain     Status: None   Collection Time: 10/09/22 10:56 AM   Specimen: Pleura; Body Fluid  Result Value Ref Range Status   Specimen Description PLEURAL  Final   Special Requests NONE  Final   Gram Stain RARE WBC SEEN NO ORGANISMS SEEN   Final   Culture   Final    NO GROWTH 3 DAYS Performed at [facility], [address], [place], [place] [address]    Report Status 10/12/2022 FINAL  Final  SARS Coronavirus 2 by RT PCR (hospital order, performed in [facility] hospital lab) *cepheid single result test* Anterior Nasal Swab     Status: None   Collection Time: 10/13/22  8:12 AM   Specimen: Anterior Nasal Swab  Result Value Ref Range Status   SARS Coronavirus 2 by RT PCR NEGATIVE NEGATIVE Final    Comment: Performed at [facility], [address] [place], [place] [address]  Respiratory (~20 pathogens) panel by PCR     Status: None   Collection Time: 10/13/22  9:05 AM   Specimen: Anterior Nasal Swab; Respiratory  Result Value Ref Range Status   Adenovirus NOT DETECTED NOT DETECTED Final   Coronavirus 229E NOT DETECTED NOT DETECTED Final    Comment: (NOTE) The Coronavirus on the Respiratory Panel, DOES NOT test for the novel  Coronavirus (2019 nCoV)    Coronavirus HKU1 NOT DETECTED NOT DETECTED Final   Coronavirus NL63  NOT DETECTED NOT DETECTED Final   Coronavirus OC43 NOT DETECTED NOT DETECTED Final   Metapneumovirus NOT DETECTED NOT DETECTED Final   Rhinovirus / Enterovirus NOT DETECTED NOT DETECTED Final   Influenza A NOT DETECTED NOT DETECTED Final   Influenza B  NOT DETECTED NOT DETECTED Final   Parainfluenza Virus 1 NOT DETECTED NOT DETECTED Final   Parainfluenza Virus 2 NOT DETECTED NOT DETECTED Final   Parainfluenza Virus 3 NOT DETECTED NOT DETECTED Final   Parainfluenza Virus 4 NOT DETECTED NOT DETECTED Final   Respiratory Syncytial Virus NOT DETECTED NOT DETECTED Final   Bordetella pertussis NOT DETECTED NOT DETECTED Final   Bordetella Parapertussis NOT DETECTED NOT DETECTED Final   Chlamydophila pneumoniae NOT DETECTED NOT DETECTED Final   Mycoplasma pneumoniae NOT DETECTED NOT DETECTED Final    Comment: Performed at [facility], [address], [place], [place] [address]      Studies:  IR Paracentesis  Result Date: 10/14/2022 INDICATION: 57 year old male recently diagnosed with metastatic renal cancer. Found to have ascites. Request for therapeutic and diagnostic paracentesis EXAM: ULTRASOUND GUIDED THERAPEUTIC AND DIAGNOSTIC LEFT SIDED PARACENTESIS MEDICATIONS: Lidocaine % 10 mL. COMPLICATIONS: None immediate. PROCEDURE: Informed written consent was obtained from the patient after a discussion of the risks, benefits and alternatives to treatment. A timeout was performed prior to the initiation of the procedure. Initial ultrasound scanning demonstrates a moderate amount of ascites within the left lower abdominal quadrant. The left lower abdomen was prepped and draped in the usual sterile fashion. 1% lidocaine was used for local anesthesia. Following this, a 19 gauge, 7-cm, Yueh catheter was introduced. An ultrasound image was saved for documentation purposes. The paracentesis was performed. The catheter was removed and a dressing was applied. The patient tolerated the procedure well without  immediate post procedural complication. FINDINGS: A total of approximately 3.9 L of cloudy yellow fluid was removed. Samples were sent to the laboratory as requested by the clinical team. IMPRESSION: Successful ultrasound-guided therapeutic and diagnostic paracentesis yielding 3.9 liters of peritoneal fluid. Read by: Anders Grant, NP Electronically Signed   By: Richarda Overlie M.D.   On: 10/14/2022 16:34   CT SOFT TISSUE NECK WO CONTRAST  Result Date: 10/13/2022 CLINICAL DATA:  Shortness of breath and difficulty breathing. Feels like throat closing. EXAM: CT NECK WITHOUT CONTRAST TECHNIQUE: Multidetector CT imaging of the neck was performed following the standard protocol without intravenous contrast. RADIATION DOSE REDUCTION: This exam was performed according to the departmental dose-optimization program which includes automated exposure control, adjustment of the mA and/or kV according to patient size and/or use of iterative reconstruction technique. COMPARISON:  Neck radiographs obtained earlier the same day. Cervical spine CT 11/09/2019, PET-CT 07/28/2022. FINDINGS: Pharynx and larynx: The nasal cavity and nasopharynx are unremarkable. The oral cavity and oropharynx are unremarkable. The parapharyngeal spaces are clear. The hypopharynx and larynx are unremarkable. The epiglottis is normal. There is no retropharyngeal fluid collection. The airway is widely patent throughout. Salivary glands: The parotid and submandibular glands are unremarkable. Thyroid: Unremarkable. Lymph nodes: There is an enlarged left supraclavicular lymph measuring up to 1.2 cm in short axis. There are additional smaller left supraclavicular lymph nodes more inferiorly measuring up to 0.6-0.7 cm. These nodes appear increased in size since the PET-CT from 07/28/2022 but similar in size compared to the recent chest CT from 08/09/2022. There is no pathologic lymphadenopathy on the right. Vascular: Grossly unremarkable, within the confines  of noncontrast technique. A right chest wall port is partially imaged. Limited intracranial: Unremarkable. Visualized orbits: Unremarkable. Mastoids and visualized paranasal sinuses: The imaged paranasal sinuses are clear. The mastoid air cells and middle ear cavities are clear. Skeleton: There is no acute osseous abnormality or suspicious osseous lesion. Upper chest: Nodular opacity in the lung  apices is similar to the recent chest CT from 10/08/2022. The left pleural effusion appears decreased in volume but remains at least moderate in size. Other: None. IMPRESSION: 1. No acute finding along the aerodigestive tract. Widely patent airway throughout. 2. Left supraclavicular lymph nodes are increased in size since the PET-CT from 07/28/2022 but stable in size compared to the recent chest CT. 3. The left pleural effusion is decreased in volume compared to the prior chest CT following interval thoracentesis but remains at least moderate in volume. Electronically Signed   By: Lesia Hausen M.D.   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 Ventilation scans intentionally deferred if perfusion scan and chest x-ray adequate for interpretation during COVID 19 epidemic. RADIOPHARMACEUTICALS:  3.7 mCi Tc-44m MAA IV COMPARISON:  Chest radiograph 10/13/2022 FINDINGS: Slight blunting of the LEFT costophrenic angle by tiny effusion. No segmental or subsegmental perfusion defects identified to suggest pulmonary embolism. IMPRESSION: No evidence of pulmonary embolism. Electronically Signed   By: Ulyses Southward M.D.   On: 10/13/2022 10:18   DG Neck Soft Tissue  Result Date: 10/13/2022 CLINICAL DATA:  Sore throat EXAM: NECK SOFT TISSUES  - 1+ VIEW COMPARISON:  None Available. FINDINGS: There is no evidence of retropharyngeal soft tissue swelling or epiglottic enlargement. The cervical airway is unremarkable and no radio-opaque foreign body identified. Moderate degenerative changes C5-C6 and C6-C7 IMPRESSION: Negative. Electronically Signed   By: Jasmine Pang M.D.   On: 10/13/2022 03:37   DG Chest Port 1 View  Result Date: 10/13/2022 CLINICAL DATA:  Shortness of breath EXAM: PORTABLE CHEST 1 VIEW COMPARISON:  10/09/2022 CT 10/08/2022 FINDINGS: Right-sided central venous port tip over the right atrium. Small left-sided pleural effusion. Diffuse interstitial and ground-glass opacity, mildly increased compared to prior, could represent edema superimposed on interstitial changes seen on the prior chest CT. Stable cardiomediastinal silhouette. No pneumothorax IMPRESSION: Diffuse interstitial and ground-glass opacity, mildly increased compared to prior, could represent edema superimposed on interstitial changes seen on prior chest CT (possibility of lymphangitic disease raised). Small left effusion. Electronically Signed   By: Jasmine Pang M.D.   On: 10/13/2022 03:37    Assessment: 57 y.o. male  Dysphagia and dyspnea, probably secondary to left pleural effusion, questionable pneumonia Metastatic renal cell carcinoma to bones Recently developed large volume ascites and left pleural effusion, malignant Chronic hyponatremia Hypertension Deconditioning Severe protein and calorie malnutrition    Plan:  -I have reviewed his chart, including his recent labs and images.  He was diagnosed with metastatic renal cell carcinoma 1 year ago, his disease appears to be pretty aggressive, he has progressed through 2 lines of therapy, and now he has had a significant disease progression on recent CT scan, including new malignant ascites, and left pleural effusion.  His prognosis is very poor. -Would consider thoracentesis, due to his dyspnea -He also  complains of dysphagia, his recent CT scan showed new circumferential wall thickening involving the mid and distal esophagus, the etiology is unclear.  Not sure if he will benefit from EGD, but it would be reasonable to evaluate with a barium swallow evaluation, will consult GI. -Patient really wants to start Belzutifan.  I do not see a contraindication for that, although I did not think it makes any  difference if he starts after discharge.  I will send a message to Dr. Ellin Saba.  -If patient does not respond to belzutifan, or if his condition continues to deteriorate in the near future, then we will need to discuss comfort care and hospice.  He lives alone, has his niece and nephew who helps him at home.  Needed PT evaluation to see if he needs rehab, will be beneficial to set up palliative home care on discharge.  -I will f/u as needed.    Malachy Mood, MD 10/14/2022  6:28 PM

## 2022-10-14 NOTE — Procedures (Signed)
Ultrasound-guided diagnostic and therapeutic paracentesis performed yielding 3.9 L liters of cloudy yellow colored fluid.  Fluid was sent to lab for analysis. No immediate complications. EBL is none.

## 2022-10-14 NOTE — Progress Notes (Signed)
[facility]                                                                              Buel Lemm, is a 57 y.o. male, DOB - 10-18-1965, ZOX:[id number] Admit date - 10/13/2022    Outpatient Primary MD for the patient is [name], FNP  LOS - 1  days  Chief Complaint  Patient presents with   Shortness of Breath       Brief summary   Patient is a 57 year old male with HTN, stage IV renal cancer with metastasis to bone presented with feeling of " throat closing up".  He questioned if it was an allergic reaction as he was on several medications. He had not darted belzutifan 120 mg daily he had as he was supposed to start today. Patient just recently hospitalized 4/27-4/29 with intractable nausea, vomiting, and abdominal Per patient, since being home he had increased abdominal swelling, dry mouth.  His throat symptoms improved in the ER however still felt some discomfort.  He previously had thrush.  Also reported nausea, back pain, fatigue, shortness of breath, substernal chest discomfort during the episode. En route with EMS, patient was given epinephrine nebulizer treatment with improvement in his symptoms  In ED, noted to be tachycardia, tachypnea, O2 sats on room air. Sodium 122, CO2 19, BUN 22, creatinine 1.23 X-rays of the neck negative.  Chest x-ray showed diffuse interstitial groundglass opacities, mildly increased, representing edema superimposed on interstitial changes.  VQ scan negative for PE Patient was admitted for further workup.  Assessment & Plan    Principal Problem:   SOB (shortness of breath), ?  Throat closing, probable CAP pneumonia -Presented acute feeling of throat closing and shortness of breath.  No thrush noted. -Chest x-ray noted diffuse interstitial and groundglass opacities  -VQ scan negative for PE. -CT soft tissue neck showed no acute findings, left supraclavicular lymph nodes increased in size, left pleural effusion  decreased. -Patient was placed on empiric doxycycline and IV Rocephin.  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 Ellin Saba oncology, recently completed XRT. Recent CT scan confirmed progression of disease with left pleural effusion, ascites, and concerns for lymphangitic spread. He was supposed  to start on belzutifan on the day of admission - + Tense ascites, patient requesting for paracentesis, recent paracentesis on 4/27 yielding 6.1 L. -Will get therapeutic ultrasound-guided paracentesis today. -Patient asking about starting chemotherapy, oncology consulted, discussed with Dr. Mosetta Putt, will evaluate -Also asking about thoracentesis, however currently no hypoxia, acute SOB and imaging with improving pleural effusion.  Does not need urgent thoracentesis at this time. -Will await oncology evaluation, likely need palliative for goals of care  Generalized debility -Lives alone and reports having to difficulty getting around. -PT OT evaluation   Prolonged QT interval - QTc was noted to be 690. -Avoid QT prolonging meds, follow K, Mag   Hypocalcemia -Patient received 2 g IV calcium gluconate in ED   Essential hypertension -Continue amlodipine   Hyperlipidemia -Continue Crestor   Severe protein calorie malnutrition Albumin noted to be significantly low at 1.9 Estimated body mass index is 20.66 kg/m as calculated from the following:   Height as of this encounter: 5\' 11"  (1.803  m).   Weight as of this encounter: 67.2 kg.  Code Status: Full code DVT Prophylaxis:  enoxaparin (LOVENOX) injection 40 mg Start: 10/13/22 0815   Level of Care: Level of care: Progressive Family Communication: Updated patient Disposition Plan:      Remains inpatient appropriate:      Procedures:    Consultants:   Oncology, Dr. Mosetta Putt  Antimicrobials:   Anti-infectives (From admission, onward)    Start     Dose/Rate Route Frequency Ordered Stop   10/13/22 2200  doxycycline (VIBRAMYCIN) 100 mg in sodium chloride 0.9 % 250 mL IVPB        100 mg 125 mL/hr over 120 Minutes Intravenous Every 12 hours 10/13/22 2050     10/13/22 2100  cefTRIAXone (ROCEPHIN) 2 g in sodium chloride 0.9 % 100 mL IVPB        2 g 200 mL/hr over 30 Minutes Intravenous Every 24 hours 10/13/22 2048            Medications  amLODipine  10 mg Oral Daily   enoxaparin (LOVENOX) injection  40 mg Subcutaneous Q24H   feeding supplement  237 mL Oral BID BM   lidocaine  2 patch Transdermal Q24H   rosuvastatin  5 mg Oral Daily   sodium chloride flush  3 mL Intravenous Q12H      Subjective:   Marcia Rodas was seen and examined today.  No acute throat closing or chest pain at this time.  Feels abdomen is tense and needs paracentesis.  No acute nausea vomiting, dizziness or lightheadedness.   Objective:   Vitals:   10/13/22 2333 10/14/22 0300 10/14/22 0400 10/14/22 1047  BP:  102/86 103/77 (!) 125/99  Pulse: 100 96 97 100  Resp: (!) 32 18 (!) 21 20  Temp:  98 F (36.7 C)  97.7 F (36.5 C)  TempSrc:  Oral  Oral  SpO2: 93% 95% 91% 92%  Weight:      Height:       No intake or output data in the 24 hours ending 10/14/22 1104   Wt Readings from Last 3 Encounters:  10/13/22 67.2 kg  10/11/22 66.3 kg  10/09/22 72.7 kg     Exam General: Alert and oriented x 3, NAD, ill-appearing Cardiovascular: S1 S2 auscultated,  RRR Respiratory: Diminished breath sounds at the bases Gastrointestinal: Soft,  distended, with some tenderness to palpation, NBS  Ext: no pedal edema bilaterally Neuro: no new deficits Psych: Normal affect     Data Reviewed:  I have personally reviewed following labs    CBC Lab Results  Component Value Date   WBC 6.3 10/14/2022   RBC 4.83 10/14/2022   HGB 13.2 10/14/2022   HCT 39.0 10/14/2022   MCV 80.7 10/14/2022   MCH 27.3 10/14/2022   PLT 199 10/14/2022   MCHC 33.8 10/14/2022   RDW 25.0 (H) 10/14/2022   LYMPHSABS 0.6 (L) 10/13/2022   MONOABS 0.5 10/13/2022   EOSABS  0.1 10/13/2022   BASOSABS 0.1 10/13/2022     Last metabolic panel Lab Results  Component Value Date   NA 125 (L) 10/14/2022   K 4.8 10/14/2022   CL 92 (L) 10/14/2022   CO2 20 (L) 10/14/2022   BUN 22 (H) 10/14/2022   CREATININE 0.85 10/14/2022   GLUCOSE 124 (H) 10/14/2022   GFRNONAA >60 10/14/2022   GFRAA 119 09/05/2015   CALCIUM 8.2 (L) 10/14/2022   PHOS 3.3 10/13/2022   PROT 6.0 (L) 10/13/2022   ALBUMIN 1.9 (L) 10/13/2022   LABGLOB 3.0 08/05/2021   AGRATIO 1.5 08/05/2021   BILITOT 0.7 10/13/2022   ALKPHOS 162 (H) 10/13/2022   AST 41 10/13/2022   ALT 24 10/13/2022   ANIONGAP 13 10/14/2022    CBG (last 3)  No results for input(s): "GLUCAP" in the last 72 hours.    Coagulation Profile: Recent Labs  Lab 10/09/22 0211  INR 1.2     Radiology Studies: I have personally reviewed the imaging studies  CT SOFT TISSUE NECK WO CONTRAST  Result Date: 10/13/2022 CLINICAL DATA:  Shortness of breath and difficulty breathing. Feels like throat closing. EXAM: CT NECK WITHOUT CONTRAST TECHNIQUE: Multidetector CT imaging of the neck was performed following the standard protocol without intravenous contrast. RADIATION DOSE REDUCTION: This exam was performed according to the departmental dose-optimization program which includes automated exposure control, adjustment of the mA and/or kV according to patient size and/or use of iterative reconstruction technique. COMPARISON:  Neck  radiographs obtained earlier the same day. Cervical spine CT 11/09/2019, PET-CT 07/28/2022. FINDINGS: Pharynx and larynx: The nasal cavity and nasopharynx are unremarkable. The oral cavity and oropharynx are unremarkable. The parapharyngeal spaces are clear. The hypopharynx and larynx are unremarkable. The epiglottis is normal. There is no retropharyngeal fluid collection. The airway is widely patent throughout. Salivary glands: The parotid and submandibular glands are unremarkable. Thyroid: Unremarkable. Lymph nodes: There is an enlarged left supraclavicular lymph measuring up to 1.2 cm in short axis. There are additional smaller left supraclavicular lymph nodes more inferiorly measuring up to 0.6-0.7 cm. These nodes appear increased in size since the PET-CT from 07/28/2022 but similar in size compared to the recent chest CT from 08/09/2022. There is no pathologic lymphadenopathy on the right. Vascular: Grossly unremarkable, within the confines of noncontrast technique. A right chest wall port is partially imaged. Limited intracranial: Unremarkable. Visualized orbits: Unremarkable. Mastoids and visualized paranasal sinuses: The imaged paranasal sinuses are clear. The mastoid air cells and middle ear cavities are clear. Skeleton: There is no acute osseous abnormality or suspicious osseous lesion. Upper chest: Nodular opacity in the lung apices is similar to the recent chest CT from 10/08/2022. The left pleural effusion appears decreased in volume but remains at least moderate in size. Other: None. IMPRESSION: 1. No acute finding along the aerodigestive tract. Widely patent airway throughout. 2. Left supraclavicular lymph nodes are increased in size since the PET-CT from 07/28/2022 but stable in size compared to the recent chest CT. 3. The left pleural effusion is decreased in volume compared to the prior chest CT following interval thoracentesis but remains at least moderate in volume. Electronically Signed   By:  Lesia Hausen M.D.   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 Ventilation  scans intentionally deferred if perfusion scan and chest x-ray adequate for interpretation during COVID 19 epidemic. RADIOPHARMACEUTICALS:  3.7 mCi Tc-38m MAA IV COMPARISON:  Chest radiograph 10/13/2022 FINDINGS: Slight blunting of the LEFT costophrenic angle by tiny effusion. No segmental or subsegmental perfusion defects identified to suggest pulmonary embolism. IMPRESSION: No evidence of pulmonary embolism. Electronically Signed   By: Ulyses Southward M.D.   On: 10/13/2022 10:18   DG Neck Soft Tissue  Result Date: 10/13/2022 CLINICAL DATA:  Sore throat EXAM: NECK SOFT TISSUES - 1+ VIEW COMPARISON:  None Available. FINDINGS: There is no evidence of retropharyngeal soft tissue swelling or epiglottic enlargement. The cervical airway is unremarkable and no radio-opaque foreign body identified. Moderate degenerative changes C5-C6 and C6-C7 IMPRESSION: Negative. Electronically Signed   By: Jasmine Pang M.D.   On: 10/13/2022 03:37   DG Chest Port 1 View  Result Date: 10/13/2022 CLINICAL DATA:  Shortness of breath EXAM: PORTABLE CHEST 1 VIEW COMPARISON:  10/09/2022 CT 10/08/2022 FINDINGS: Right-sided central venous port tip over the right atrium. Small left-sided pleural effusion. Diffuse interstitial and ground-glass opacity, mildly increased compared to prior, could represent edema superimposed on interstitial changes seen on the prior chest CT. Stable cardiomediastinal silhouette. No pneumothorax IMPRESSION: Diffuse interstitial and ground-glass opacity, mildly increased compared to prior, could represent edema superimposed on interstitial  changes seen on prior chest CT (possibility of lymphangitic disease raised). Small left effusion. Electronically Signed   By: Jasmine Pang M.D.   On: 10/13/2022 03:37       Braedyn Kauk M.D. [facility] 10/14/2022, 11:04 AM  Available via Epic secure chat 7am-7pm After 7 pm, please refer to night coverage provider listed on amion.

## 2022-10-14 NOTE — TOC Initial Note (Signed)
Transition of Care [facility]) - Initial/Assessment Note    Patient Details  Name: Jacob Reynolds MRN: [id number] Date of Birth: 25-Dec-1965  Transition of Care [facility]) CM/SW Contact:    Kingsley Plan, RN Phone Number: 10/14/2022, 12:29 PM  Clinical Narrative:                 Spoke to patient on phone. Patient confirmed face sheet information.   Patient from home alone, however has family close by who can assist (niece, nephew and brother).   Patient for   paracentesis today,  oncology consulted, and PT evaluation . Per MD note  likely need palliative for goals of care.   Discussed above with patient. WIll await recommendations from consults.   Patient aware if recommendation for HHPT, HHPT only visits one to two times a week for about a hour. Insurance does not pay for assistance with house work , shopping, personnel caregivers . Patient voiced understanding.     Expected Discharge Plan:  (await PT recommendations) Barriers to Discharge: Continued Medical Work up   Patient Goals and CMS Choice Patient states their goals for this hospitalization and ongoing recovery are:: to return to home     [place] ownership interest in [employer]provided to:: Patient    Expected Discharge Plan and Services   Discharge Planning Services: CM Consult   Living arrangements for the past 2 months: Single Family Home                 DME Arranged:  (Await PT recommendations)                    Prior Living Arrangements/Services Living arrangements for the past 2 months: Single Family Home Lives with:: Self Patient language and need for interpreter reviewed:: Yes Do you feel safe going back to the place where you live?: Yes      Need for Family Participation in Patient Care: Yes (Comment) Care giver support system in place?: Yes (comment)   Criminal Activity/Legal Involvement Pertinent to Current Situation/Hospitalization: No - Comment as needed  Activities of Daily  Living      Permission Sought/Granted   Permission granted to share information with : No              Emotional Assessment   Attitude/Demeanor/Rapport: Engaged Affect (typically observed): Accepting Orientation: : Oriented to Self, Oriented to Place, Oriented to  Time, Oriented to Situation Alcohol / Substance Use: Not Applicable Psych Involvement: No (comment)  Admission diagnosis:  SOB (shortness of breath) [R06.02] Hyponatremia [E87.1] Patient Active Problem List   Diagnosis Date Noted   SOB (shortness of breath) 10/13/2022   CAP (community acquired pneumonia) 10/13/2022   Generalized weakness 10/13/2022   Elevated troponin 10/13/2022   Prolonged QT interval 10/13/2022   Hypocalcemia 10/13/2022   Protein-calorie malnutrition, severe (HCC) 10/13/2022   Essential hypertension 10/08/2022   Intractable nausea and vomiting 10/08/2022   Ascites, malignant 10/08/2022   Cancer-related pain 10/08/2022   Hyponatremia 10/08/2022   Hypokalemia 10/04/2022   Mixed hyperlipidemia 08/08/2022   Tobacco abuse 08/08/2022   Port-A-Cath in place 11/10/2021   Metastatic renal cell carcinoma to bone (HCC) 11/04/2021   Aortic atherosclerosis (HCC) 09/07/2021   Pulmonary emphysema (HCC) 09/07/2021   Depression, major, single episode, mild (HCC) 08/05/2021   Foot pain, right 07/27/2021   Callus 02/01/2021   Calcaneal spur of left foot 02/01/2021   Generalized anxiety disorder 02/01/2021   Cervical radiculopathy 02/01/2021   PCP:  [name], FNP Pharmacy:   [facility] [address] [place], [place] - [address] [place] [address] Phone: [phone] Fax: [phone]  [id number] [place], [place] - [address] [place] [address] Phone: [phone] Fax: [phone]  [facility] [place], [place] - [address] [place] [address] Phone:  [phone] Fax: [phone]  [facility], [place] - [address] [facility] [address] [place] [address] Phone: [phone] Fax: [phone]  [facility] [phone] - [place], [place] - [address] AT [facility] [address] [place] [address] Phone: [phone] Fax: [phone]     Social Determinants of Health (SDOH) Social History: SDOH Screenings   Depression (PHQ2-9): Medium Risk (08/08/2022)  Tobacco Use: High Risk (10/13/2022)   SDOH Interventions:     Readmission Risk Interventions     No data to display

## 2022-10-15 ENCOUNTER — Inpatient Hospital Stay (HOSPITAL_COMMUNITY): Payer: 59

## 2022-10-15 DIAGNOSIS — J189 Pneumonia, unspecified organism: Secondary | ICD-10-CM | POA: Diagnosis not present

## 2022-10-15 DIAGNOSIS — C7951 Secondary malignant neoplasm of bone: Secondary | ICD-10-CM | POA: Diagnosis not present

## 2022-10-15 DIAGNOSIS — E871 Hypo-osmolality and hyponatremia: Secondary | ICD-10-CM | POA: Diagnosis not present

## 2022-10-15 DIAGNOSIS — R0602 Shortness of breath: Secondary | ICD-10-CM | POA: Diagnosis not present

## 2022-10-15 LAB — BASIC METABOLIC PANEL
Anion gap: 15 (ref 5–15)
BUN: 19 mg/dL (ref 6–20)
CO2: 22 mmol/L (ref 22–32)
Calcium: 8.6 mg/dL — ABNORMAL LOW (ref 8.9–10.3)
Chloride: 88 mmol/L — ABNORMAL LOW (ref 98–111)
Creatinine, Ser: 0.98 mg/dL (ref 0.61–1.24)
GFR, Estimated: >60 mL/min (ref >60)
Glucose, Bld: 105 mg/dL — ABNORMAL HIGH (ref 70–99)
Potassium: 4.2 mmol/L (ref 3.5–5.1)
Sodium: 125 mmol/L — ABNORMAL LOW (ref 135–145)

## 2022-10-15 LAB — BODY FLUID CELL COUNT WITH DIFFERENTIAL
Eos, Fluid: 0 %
Lymphs, Fluid: 36 %
Monocyte-Macrophage-Serous Fluid: 41 % — ABNORMAL LOW (ref 50–90)
Neutrophil Count, Fluid: 23 % (ref 0–25)
Total Nucleated Cell Count, Fluid: 2534 cu mm — ABNORMAL HIGH (ref 0–1000)

## 2022-10-15 LAB — CBC
HCT: 36.9 % — ABNORMAL LOW (ref 39.0–52.0)
Hemoglobin: 12.7 g/dL — ABNORMAL LOW (ref 13.0–17.0)
MCH: 28 pg (ref 26.0–34.0)
MCHC: 34.4 g/dL (ref 30.0–36.0)
MCV: 81.3 fL (ref 80.0–100.0)
Platelets: 193 10*3/uL (ref 150–400)
RBC: 4.54 MIL/uL (ref 4.22–5.81)
RDW: 25.1 % — ABNORMAL HIGH (ref 11.5–15.5)
WBC: 6.3 10*3/uL (ref 4.0–10.5)
nRBC: 0 % (ref 0.0–0.2)

## 2022-10-15 LAB — PROTEIN, PLEURAL OR PERITONEAL FLUID: Total protein, fluid: 4.4 g/dL

## 2022-10-15 LAB — GLUCOSE, PLEURAL OR PERITONEAL FLUID: Glucose, Fluid: 31 mg/dL

## 2022-10-15 LAB — LACTATE DEHYDROGENASE, PLEURAL OR PERITONEAL FLUID: LD, Fluid: 632 U/L — ABNORMAL HIGH (ref 3–23)

## 2022-10-15 LAB — BODY FLUID CULTURE W GRAM STAIN

## 2022-10-15 MED ORDER — BELZUTIFAN 40 MG PO TABS
120.0000 mg | ORAL_TABLET | Freq: Every day | ORAL | Status: DC
  Administered 2022-10-15 – 2022-10-28 (×14): 120 mg via ORAL
  Filled 2022-10-15 (×14): qty 3

## 2022-10-15 MED ORDER — LIDOCAINE HCL (PF) 1 % IJ SOLN
INTRAMUSCULAR | Status: AC
  Filled 2022-10-15: qty 30

## 2022-10-15 MED ORDER — LIDOCAINE HCL (PF) 1 % IJ SOLN
5.0000 mL | Freq: Once | INTRAMUSCULAR | Status: AC
  Administered 2022-10-15: 5 mL via INTRADERMAL

## 2022-10-15 NOTE — Progress Notes (Signed)
[facility]                                                                              Jacob Reynolds, is a 57 y.o. 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  He questioned if it was an allergic reaction as he was on several medications. He had not darted belzutifan 120 mg daily he had as he was supposed to start today. Patient just recently hospitalized 4/27-4/29 with intractable nausea, vomiting, and abdominal Per patient, since being home he had increased abdominal swelling, dry mouth.  His throat symptoms improved in the ER however still felt some discomfort.  He previously had thrush.  Also reported nausea, back pain, fatigue, shortness of breath, substernal chest discomfort during the episode. En route with EMS, patient was given epinephrine nebulizer treatment with improvement in his symptoms  In ED, noted to be tachycardia, tachypnea, O2 sats on room air. Sodium 122, CO2 19, BUN 22, creatinine 1.23 X-rays of the neck negative.  Chest x-ray showed diffuse interstitial groundglass opacities, mildly increased, representing edema superimposed on interstitial changes.  VQ scan negative for PE Patient was admitted for further workup.  Assessment & Plan    Principal Problem:   SOB (shortness of breath), ?  Throat closing, probable CAP pneumonia -Presented acute feeling of throat closing and shortness of breath.  No thrush noted. -Chest x-ray noted diffuse interstitial and groundglass opacities  -VQ scan negative for PE. -CT soft tissue neck showed no acute findings, left supraclavicular lymph nodes increased in size, left pleural effusion  decreased. -Continue doxycycline and IV Rocephin.  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 Ellin Saba oncology, recently completed XRT. Recent CT scan confirmed progression of disease with left pleural effusion, ascites, and concerns for lymphangitic spread. He was supposed  to start on belzutifan on the day of admission - + Tense ascites, patient requesting for paracentesis, recent paracentesis on 4/27 yielding 6.1 L. -Underwent paracentesis 5/3, 3.9 L removed -Ordered US guided left thoracentesis today -Will get therapeutic ultrasound-guided paracentesis today. -Appreciate oncology evaluation, resume Belzutifan per recommendations -PT OT eval   Generalized debility -Lives alone and reports having to difficulty getting around. -PT OT evaluation   Prolonged QT interval -  QTc was noted to be 690. -Avoid QT prolonging meds, follow K, Mag   Hypocalcemia -Patient received 2 g IV calcium gluconate in ED   Essential hypertension -Continue amlodipine   Hyperlipidemia -Continue Crestor   Severe protein calorie malnutrition Albumin noted to be significantly low at 1.9 Estimated body mass index is 20.66 kg/m as calculated from the following:   Height as of this encounter: 5\' 11"  (1.803 m).   Weight as of this encounter: 67.2 kg.  Code Status: Full code DVT Prophylaxis:  enoxaparin (LOVENOX) injection 40 mg  Start: 10/13/22 0815   Level of Care: Level of care: Progressive Family Communication: Updated patient Disposition Plan:      Remains inpatient appropriate:      Procedures:  Ultrasound-guided paracentesis 5/3  Consultants:   Oncology, Dr. Mosetta Putt  Antimicrobials:   Anti-infectives (From admission, onward)    Start     Dose/Rate Route Frequency Ordered Stop   10/14/22 2200  doxycycline (VIBRA-TABS) tablet 100 mg        100 mg Oral Every 12 hours 10/14/22 1140 10/18/22 2159   10/13/22 2200  doxycycline (VIBRAMYCIN) 100 mg in sodium chloride 0.9 % 250 mL IVPB  Status:  Discontinued        100 mg 125 mL/hr over 120 Minutes Intravenous Every 12 hours 10/13/22 2050 10/14/22 1140   10/13/22 2100  cefTRIAXone (ROCEPHIN) 2 g in sodium chloride 0.9 % 100 mL IVPB        2 g 200 mL/hr over 30 Minutes Intravenous Every 24 hours 10/13/22 2048            Medications  amLODipine  10 mg Oral Daily   belzutifan  120 mg Oral Daily   Chlorhexidine Gluconate Cloth  6 each Topical Daily   doxycycline  100 mg Oral Q12H   enoxaparin (LOVENOX) injection  40 mg Subcutaneous Q24H   feeding supplement  237 mL Oral BID BM   fluticasone furoate-vilanterol  1 puff Inhalation Daily   lidocaine  2 patch Transdermal Q24H   magnesium oxide  400 mg Oral Daily   rosuvastatin  5 mg Oral Daily   sodium chloride flush  10-40 mL Intracatheter Q12H   sodium chloride flush  3 mL Intravenous Q12H   umeclidinium bromide  1 puff Inhalation Daily      Subjective:   Jacob Reynolds was seen and examined today.  Ambulating in the room, still feels distended abdomen, wondering about thoracentesis.  No acute fever chills, chest pain, dizziness or lightheadedness.   Objective:   Vitals:   10/15/22 0300 10/15/22 0500 10/15/22 0600 10/15/22 0727  BP: 102/86 106/76 106/80 107/83  Pulse: 94 92 94 98  Resp: 18 19 15  (!) 23  Temp: 97.6 F (36.4 C)   (!) 97.1 F (36.2 C)  TempSrc: Oral   Axillary  SpO2: 90% (!)  88% 93% 93%  Weight:      Height:        Intake/Output Summary (Last 24 hours) at 10/15/2022 1146 Last data filed at 10/15/2022 0200 Gross per 24 hour  Intake 822.43 ml  Output --  Net 822.43 ml     Wt Readings from Last 3 Encounters:  10/13/22 67.2 kg  10/11/22 66.3 kg  10/09/22 72.7 kg    Physical Exam General: Alert and oriented x 3, NAD Cardiovascular: S1 S2 clear, RRR.  Respiratory: Diminished breath sounds at the bases, L>R Gastrointestinal: Soft, distended/ascites, NBS  Ext: no pedal edema bilaterally Neuro: no new deficits  Psych: Normal affect    Data Reviewed:  I have personally reviewed following labs    CBC Lab Results  Component Value Date   WBC 6.3 10/15/2022   RBC 4.54 10/15/2022   HGB 12.7 (L) 10/15/2022   HCT 36.9 (L) 10/15/2022   MCV 81.3 10/15/2022   MCH 28.0 10/15/2022   PLT 193 10/15/2022   MCHC 34.4 10/15/2022   RDW 25.1 (H) 10/15/2022   LYMPHSABS 0.6 (L) 10/13/2022   MONOABS 0.5 10/13/2022   EOSABS 0.1 10/13/2022   BASOSABS 0.1 10/13/2022     Last metabolic panel Lab Results  Component Value Date   NA 125 (L) 10/15/2022   K 4.2 10/15/2022   CL 88 (L) 10/15/2022   CO2 22 10/15/2022   BUN 19 10/15/2022   CREATININE 0.98 10/15/2022   GLUCOSE 105 (H) 10/15/2022   GFRNONAA >60 10/15/2022   GFRAA 119 09/05/2015   CALCIUM 8.6 (L) 10/15/2022   PHOS 3.3 10/13/2022   PROT 6.0 (L) 10/13/2022   ALBUMIN 1.9 (L) 10/13/2022   LABGLOB 3.0 08/05/2021   AGRATIO 1.5 08/05/2021   BILITOT 0.7 10/13/2022   ALKPHOS 162 (H) 10/13/2022   AST 41 10/13/2022   ALT 24 10/13/2022   ANIONGAP 15 10/15/2022    CBG (last 3)  No results for input(s): "GLUCAP" in the last 72 hours.    Coagulation Profile: Recent Labs  Lab 10/09/22 0211  INR 1.2     Radiology Studies: I have personally reviewed the imaging studies  IR Paracentesis  Result Date: 10/14/2022 INDICATION: 57 year old male recently diagnosed with metastatic renal cancer. Found to have  ascites. Request for therapeutic and diagnostic paracentesis EXAM: ULTRASOUND GUIDED THERAPEUTIC AND DIAGNOSTIC LEFT SIDED PARACENTESIS MEDICATIONS: Lidocaine % 10 mL. COMPLICATIONS: None immediate. PROCEDURE: Informed written consent was obtained from the patient after a discussion of the risks, benefits and alternatives to treatment. A timeout was performed prior to the initiation of the procedure. Initial ultrasound scanning demonstrates a moderate amount of ascites within the left lower abdominal quadrant. The left lower abdomen was prepped and draped in the usual sterile fashion. 1% lidocaine was used for local anesthesia. Following this, a 19 gauge, 7-cm, Yueh catheter was introduced. An ultrasound image was saved for documentation purposes. The paracentesis was performed. The catheter was removed and a dressing was applied. The patient tolerated the procedure well without immediate post procedural complication. FINDINGS: A total of approximately 3.9 L of cloudy yellow fluid was removed. Samples were sent to the laboratory as requested by the clinical team. IMPRESSION: Successful ultrasound-guided therapeutic and diagnostic paracentesis yielding 3.9 liters of peritoneal fluid. Read by: Anders Grant, NP Electronically Signed   By: Richarda Overlie M.D.   On: 10/14/2022 16:34   CT SOFT TISSUE NECK WO CONTRAST  Result Date: 10/13/2022 CLINICAL DATA:  Shortness of breath and difficulty breathing. Feels like throat closing. EXAM: CT NECK WITHOUT CONTRAST TECHNIQUE: Multidetector CT imaging of the neck was performed following the standard protocol without intravenous contrast. RADIATION DOSE REDUCTION: This exam was performed according to the departmental dose-optimization program which includes automated exposure control, adjustment of the mA and/or kV according to patient size and/or use of iterative reconstruction technique. COMPARISON:  Neck radiographs obtained earlier the same day. Cervical spine CT  11/09/2019, PET-CT 07/28/2022. FINDINGS: Pharynx and larynx: The nasal cavity and nasopharynx are unremarkable. The oral cavity and oropharynx are unremarkable. The parapharyngeal spaces are clear. The hypopharynx and larynx are unremarkable. The epiglottis is normal. There is no retropharyngeal  fluid collection. The airway is widely patent throughout. Salivary glands: The parotid and submandibular glands are unremarkable. Thyroid: Unremarkable. Lymph nodes: There is an enlarged left supraclavicular lymph measuring up to 1.2 cm in short axis. There are additional smaller left supraclavicular lymph nodes more inferiorly measuring up to 0.6-0.7 cm. These nodes appear increased in size since the PET-CT from 07/28/2022 but similar in size compared to the recent chest CT from 08/09/2022. There is no pathologic lymphadenopathy on the right. Vascular: Grossly unremarkable, within the confines of noncontrast technique. A right chest wall port is partially imaged. Limited intracranial: Unremarkable. Visualized orbits: Unremarkable. Mastoids and visualized paranasal sinuses: The imaged paranasal sinuses are clear. The mastoid air cells and middle ear cavities are clear. Skeleton: There is no acute osseous abnormality or suspicious osseous lesion. Upper chest: Nodular opacity in the lung apices is similar to the recent chest CT from 10/08/2022. The left pleural effusion appears decreased in volume but remains at least moderate in size. Other: None. IMPRESSION: 1. No acute finding along the aerodigestive tract. Widely patent airway throughout. 2. Left supraclavicular lymph nodes are increased in size since the PET-CT from 07/28/2022 but stable in size compared to the recent chest CT. 3. The left pleural effusion is decreased in volume compared to the prior chest CT following interval thoracentesis but remains at least moderate in volume. Electronically Signed   By: Lesia Hausen M.D.   On: 10/13/2022 12:16       Jacob Reynolds M.D. [facility] 10/15/2022, 11:46 AM  Available via Epic secure chat 7am-7pm After 7 pm, please refer to night coverage provider listed on amion.

## 2022-10-15 NOTE — Progress Notes (Signed)
   10/15/22 1818  Assess: MEWS Score  Temp 98.1 F (36.7 C)  BP 113/89  MAP (mmHg) 96  ECG Heart Rate (!) 106  Resp (!) 22  Assess: MEWS Score  MEWS Temp 0  MEWS Systolic 0  MEWS Pulse 1  MEWS RR 1  MEWS LOC 0  MEWS Score 2  MEWS Score Color Yellow  Assess: if the MEWS score is Yellow or Red  Were vital signs taken at a resting state? Yes  Focused Assessment Change from prior assessment (see assessment flowsheet)  Does the patient meet 2 or more of the SIRS criteria? No  Does the patient have a confirmed or suspected source of infection? No  MEWS guidelines implemented  Yes, yellow  Treat  MEWS Interventions Considered administering scheduled or prn medications/treatments as ordered  Take Vital Signs  Increase Vital Sign Frequency  Yellow: Q2hr x1, continue Q4hrs until patient remains green for 12hrs  Escalate  MEWS: Escalate Yellow: Discuss with charge nurse and consider notifying provider and/or RRT  Notify: Charge Nurse/RN  Name of Charge Nurse/RN Notified Amina  Assess: SIRS CRITERIA  SIRS Temperature  0  SIRS Pulse 1  SIRS Respirations  1  SIRS WBC 0  SIRS Score Sum  2   Charge nurse aware, trying to presuade to take pain meds

## 2022-10-15 NOTE — Progress Notes (Signed)
Med/onc brief note   I spoke with pt's primary oncologist Dr. Ellin Saba and both of [place] feel OK to let him start Belzutifan 120mg  daily today. Pt brought the medicine to hospital and it's in his room. I called pt and he wants to start today. I called MD inpt pharmacy and they will start him on today as a home medication. Please monitor CBC and CMP. Daily. I will f/u.  Malachy Mood MD 10/15/2022

## 2022-10-15 NOTE — Procedures (Signed)
PROCEDURE SUMMARY:  Successful image-guided left thoracentesis. Yielded 520 milliliters of cloudy yellow (chylous appearing - consider sending for triglycerides if there are concerns) fluid. Patient tolerated procedure well. EBL < 1 mL No immediate complications.  Specimen was sent for labs. Post procedure CXR pending.  Please see imaging section of Epic for full dictation.  Villa Herb PA-C 10/15/2022 1:55 PM

## 2022-10-16 ENCOUNTER — Encounter (HOSPITAL_COMMUNITY): Payer: Self-pay | Admitting: Internal Medicine

## 2022-10-16 DIAGNOSIS — J189 Pneumonia, unspecified organism: Secondary | ICD-10-CM | POA: Diagnosis not present

## 2022-10-16 DIAGNOSIS — E871 Hypo-osmolality and hyponatremia: Secondary | ICD-10-CM | POA: Diagnosis not present

## 2022-10-16 DIAGNOSIS — R0602 Shortness of breath: Secondary | ICD-10-CM | POA: Diagnosis not present

## 2022-10-16 DIAGNOSIS — R0609 Other forms of dyspnea: Secondary | ICD-10-CM

## 2022-10-16 LAB — BASIC METABOLIC PANEL
Anion gap: 14 (ref 5–15)
BUN: 20 mg/dL (ref 6–20)
CO2: 21 mmol/L — ABNORMAL LOW (ref 22–32)
Calcium: 8.4 mg/dL — ABNORMAL LOW (ref 8.9–10.3)
Chloride: 87 mmol/L — ABNORMAL LOW (ref 98–111)
Creatinine, Ser: 0.84 mg/dL (ref 0.61–1.24)
GFR, Estimated: >60 mL/min (ref >60)
Glucose, Bld: 118 mg/dL — ABNORMAL HIGH (ref 70–99)
Potassium: 4.4 mmol/L (ref 3.5–5.1)
Sodium: 122 mmol/L — ABNORMAL LOW (ref 135–145)

## 2022-10-16 LAB — CBC
HCT: 39.4 % (ref 39.0–52.0)
Hemoglobin: 13.6 g/dL (ref 13.0–17.0)
MCH: 28 pg (ref 26.0–34.0)
MCHC: 34.5 g/dL (ref 30.0–36.0)
MCV: 81.2 fL (ref 80.0–100.0)
Platelets: 206 10*3/uL (ref 150–400)
RBC: 4.85 MIL/uL (ref 4.22–5.81)
RDW: 24.8 % — ABNORMAL HIGH (ref 11.5–15.5)
WBC: 6.5 10*3/uL (ref 4.0–10.5)
nRBC: 0 % (ref 0.0–0.2)

## 2022-10-16 LAB — BODY FLUID CULTURE W GRAM STAIN: Culture: NO GROWTH

## 2022-10-16 MED ORDER — SODIUM CHLORIDE 0.9 % IV SOLN: INTRAVENOUS | Status: DC

## 2022-10-16 NOTE — Progress Notes (Addendum)
[facility]                                                                              Jacob Reynolds, is a 57 y.o. male, DOB - October 04, 1965, ZOX:[id number] Admit date - 10/13/2022    Outpatient Primary MD for the patient is [name], FNP  LOS - 3  days  Chief Complaint  Patient presents with   Shortness of Breath       Brief summary   Patient is a 57 year old male with HTN, stage IV renal cancer with metastasis to bone presented with feeling of " throat closing up".  He questioned if it was an allergic reaction as he was on several medications. He had not darted belzutifan 120 mg daily he had as he was supposed to start today. Patient just recently hospitalized 4/27-4/29 with intractable nausea, vomiting, and abdominal Per patient, since being home he had increased abdominal swelling, dry mouth.  His throat symptoms improved in the ER however still felt some discomfort.  He previously had thrush.  Also reported nausea, back pain, fatigue, shortness of breath, substernal chest discomfort during the episode. En route with EMS, patient was given epinephrine nebulizer treatment with improvement in his symptoms  In ED, noted to be tachycardia, tachypnea, O2 sats on room air. Sodium 122, CO2 19, BUN 22, creatinine 1.23 X-rays of the neck negative.  Chest x-ray showed diffuse interstitial groundglass opacities, mildly increased, representing edema superimposed on interstitial changes.  VQ scan negative for PE Patient was admitted for further workup.  Assessment & Plan    Principal Problem:   SOB (shortness of breath), ?  Throat closing, probable CAP pneumonia -Presented acute feeling of throat closing and shortness of breath.  No thrush noted. -Chest x-ray noted diffuse interstitial and groundglass opacities  -VQ scan negative for PE. -CT soft tissue neck showed no acute findings, left supraclavicular lymph nodes increased in size, left pleural effusion  decreased. -Continue doxycycline and IV Rocephin.  Procalcitonin 0.63 -Underwent ultrasound-guided thoracentesis, Left on 5/4.  520 cc of cloudy yellow chylous appearing fluid removed.  Sent for triglycerides. 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 Ellin Saba oncology, recently completed XRT. Recent CT scan confirmed progression of disease with left pleural effusion, ascites, and concerns for lymphangitic spread. He was supposed  to start on belzutifan on the day of admission - Underwent paracentesis 5/3, 3.9 L removed -Underwent left-sided thoracentesis on 5/4, 520 cc removed  -Appreciate oncology evaluation,  started Belzutifan per recommendations -PT OT eval   Generalized debility -Lives alone and reports having to difficulty getting around. -PT OT evaluation   Prolonged QT interval - QTc was noted to be 690. -Avoid QT prolonging meds, follow K, Mag   Hypocalcemia -Patient received 2 g IV calcium gluconate in ED   Essential hypertension -Continue amlodipine   Hyperlipidemia -Continue Crestor   Severe protein calorie malnutrition Albumin noted to be significantly low at 1.9 Estimated body mass index is 20.66 kg/m as calculated from the following:   Height  as of this encounter: 5\' 11"  (1.803 m).   Weight as of this encounter: 67.2 kg.  Code Status: Full code DVT Prophylaxis:  enoxaparin (LOVENOX) injection 40 mg Start: 10/13/22 0815   Level of Care: Level of care: Progressive Family Communication: Updated patient Disposition Plan:      Remains inpatient appropriate:   Hyponatremia, hopefully DC home in next 24 to 48 hours if improving.   Procedures:  Ultrasound-guided paracentesis 5/3  Consultants:   Oncology, Dr. Mosetta Putt Nephrology  Antimicrobials:   Anti-infectives (From admission, onward)    Start     Dose/Rate Route Frequency Ordered Stop   10/14/22 2200  doxycycline (VIBRA-TABS) tablet 100 mg        100 mg Oral Every 12 hours 10/14/22 1140 10/18/22 2159   10/13/22 2200  doxycycline (VIBRAMYCIN) 100 mg in sodium chloride 0.9 % 250 mL IVPB  Status:  Discontinued        100 mg 125 mL/hr over 120 Minutes Intravenous Every 12 hours 10/13/22 2050 10/14/22 1140   10/13/22 2100  cefTRIAXone (ROCEPHIN) 2 g in sodium chloride 0.9 % 100 mL IVPB        2 g 200 mL/hr over 30 Minutes Intravenous Every 24 hours 10/13/22 2048            Medications  amLODipine  10 mg Oral Daily   belzutifan  120 mg Oral Daily   Chlorhexidine Gluconate Cloth  6 each Topical Daily   doxycycline  100 mg Oral Q12H   enoxaparin (LOVENOX) injection  40 mg Subcutaneous Q24H   feeding supplement  237 mL Oral BID BM   fluticasone furoate-vilanterol  1 puff Inhalation Daily   lidocaine  2 patch Transdermal Q24H   magnesium oxide  400 mg Oral Daily   rosuvastatin  5 mg Oral Daily   sodium chloride flush  10-40 mL Intracatheter Q12H   sodium chloride flush  3 mL Intravenous Q12H   umeclidinium bromide  1 puff Inhalation Daily      Subjective:   Jacob Reynolds was seen and examined today.  Sitting up in the chair, eating breakfast.  No acute complaints, still has generalized weakness.  Feels somewhat better after thoracentesis.  Still with abdominal  distention and pain.   Objective:   Vitals:   10/16/22 0200 10/16/22 0354 10/16/22 0704 10/16/22 0928  BP: 109/85 (!) 120/92 106/87   Pulse: 92     Resp: (!) 21  20   Temp:  (!) 97.3 F (36.3 C) 97.6 F (36.4 C)   TempSrc:  Axillary Axillary   SpO2: 92%   99%  Weight:      Height:        Intake/Output Summary (Last 24 hours) at 10/16/2022 1036 Last data filed at 10/16/2022 0900 Gross per 24 hour  Intake 342 ml  Output 0 ml  Net 342 ml     Wt Readings from Last 3 Encounters:  10/13/22 67.2  kg  10/11/22 66.3 kg  10/09/22 72.7 kg   Physical Exam General: Alert and oriented x 3, NAD, ill-appearing, cachectic Cardiovascular: S1 S2 clear, RRR.  Respiratory: Diminished breath sounds at the bases Gastrointestinal: Soft, diffuse TTP, distended, NBS  Ext: no pedal edema bilaterally Neuro: no new deficits Psych: Normal affect    Data Reviewed:  I have personally reviewed following labs    CBC Lab Results  Component Value Date   WBC 6.5 10/16/2022   RBC 4.85 10/16/2022   HGB 13.6 10/16/2022   HCT 39.4 10/16/2022   MCV 81.2 10/16/2022   MCH 28.0 10/16/2022   PLT 206 10/16/2022   MCHC 34.5 10/16/2022   RDW 24.8 (H) 10/16/2022   LYMPHSABS 0.6 (L) 10/13/2022   MONOABS 0.5 10/13/2022   EOSABS 0.1 10/13/2022   BASOSABS 0.1 10/13/2022     Last metabolic panel Lab Results  Component Value Date   NA 122 (L) 10/16/2022   K 4.4 10/16/2022   CL 87 (L) 10/16/2022   CO2 21 (L) 10/16/2022   BUN 20 10/16/2022   CREATININE 0.84 10/16/2022   GLUCOSE 118 (H) 10/16/2022   GFRNONAA >60 10/16/2022   GFRAA 119 09/05/2015   CALCIUM 8.4 (L) 10/16/2022   PHOS 3.3 10/13/2022   PROT 6.0 (L) 10/13/2022   ALBUMIN 1.9 (L) 10/13/2022   LABGLOB 3.0 08/05/2021   AGRATIO 1.5 08/05/2021   BILITOT 0.7 10/13/2022   ALKPHOS 162 (H) 10/13/2022   AST 41 10/13/2022   ALT 24 10/13/2022   ANIONGAP 14 10/16/2022    CBG (last 3)  No results for input(s): "GLUCAP" in the last 72 hours.     Coagulation Profile: No results for input(s): "INR", "PROTIME" in the last 168 hours.    Radiology Studies: I have personally reviewed the imaging studies  DG Chest Port 1 View  Result Date: 10/15/2022 CLINICAL DATA:  Pleural effusion on the left. Status post left thoracentesis. EXAM: PORTABLE CHEST 1 VIEW COMPARISON:  10/13/2022 FINDINGS: Stable position of the right jugular Port-A-Cath with the tip near the superior cavoatrial junction. Again noted are diffuse interstitial lung densities, right side slightly greater than left. Slightly improved aeration at the left lung base compared to the recent comparison examination. Probable small bilateral pleural effusions or blunting at the costophrenic angles. Negative for a pneumothorax. Heart size is normal. Trachea is midline. IMPRESSION: 1. Negative for a pneumothorax following the left thoracentesis. 2. Slightly improved aeration at the left lung base compared to the recent comparison examination. 3. Cannot exclude small bilateral pleural effusions. 4. Persistent prominent interstitial lung markings. Electronically Signed   By: Richarda Overlie M.D.   On: 10/15/2022 14:11   US THORACENTESIS ASP PLEURAL SPACE W/IMG GUIDE  Result Date: 10/15/2022 INDICATION: Patient with history of renal cell carcinoma, recurrent ascites and left pleural effusion. Request for diagnostic and therapeutic left thoracentesis. EXAM: ULTRASOUND GUIDED LEFT THORACENTESIS MEDICATIONS: 5 mL 1% lidocaine COMPLICATIONS: None immediate. PROCEDURE: An ultrasound guided thoracentesis was thoroughly discussed with the patient and questions answered. The benefits, risks, alternatives and complications were also discussed. The patient understands and wishes to proceed with the procedure. Written consent was obtained. Ultrasound was performed to localize and mark an adequate pocket of fluid in the left chest. The area was then prepped and draped in the normal sterile fashion. 1% Lidocaine was  used for local anesthesia. Under ultrasound guidance a 6 Fr Safe-T-Centesis catheter was introduced. Thoracentesis was performed. The catheter was removed and a dressing applied. FINDINGS: A  total of approximately 520 mL of cloudy yellow, chylous appearing fluid was removed. Samples were sent to the laboratory as requested by the clinical team. IMPRESSION: Successful ultrasound guided left thoracentesis yielding 520 mL of pleural fluid. Performed by Lynnette Caffey, PA-C Electronically Signed   By: Simonne Come M.D.   On: 10/15/2022 14:10   IR Paracentesis  Result Date: 10/14/2022 INDICATION: 57 year old male recently diagnosed with metastatic renal cancer. Found to have ascites. Request for therapeutic and diagnostic paracentesis EXAM: ULTRASOUND GUIDED THERAPEUTIC AND DIAGNOSTIC LEFT SIDED PARACENTESIS MEDICATIONS: Lidocaine % 10 mL. COMPLICATIONS: None immediate. PROCEDURE: Informed written consent was obtained from the patient after a discussion of the risks, benefits and alternatives to treatment. A timeout was performed prior to the initiation of the procedure. Initial ultrasound scanning demonstrates a moderate amount of ascites within the left lower abdominal quadrant. The left lower abdomen was prepped and draped in the usual sterile fashion. 1% lidocaine was used for local anesthesia. Following this, a 19 gauge, 7-cm, Yueh catheter was introduced. An ultrasound image was saved for documentation purposes. The paracentesis was performed. The catheter was removed and a dressing was applied. The patient tolerated the procedure well without immediate post procedural complication. FINDINGS: A total of approximately 3.9 L of cloudy yellow fluid was removed. Samples were sent to the laboratory as requested by the clinical team. IMPRESSION: Successful ultrasound-guided therapeutic and diagnostic paracentesis yielding 3.9 liters of peritoneal fluid. Read by: Anders Grant, NP Electronically Signed   By:  Richarda Overlie M.D.   On: 10/14/2022 16:34       Frandy Basnett M.D. [facility] 10/16/2022, 10:36 AM  Available via Epic secure chat 7am-7pm After 7 pm, please refer to night coverage provider listed on amion.

## 2022-10-16 NOTE — Progress Notes (Signed)
Jacob Reynolds   DOB:1965-12-15   WJ#:[id number]   AOZ#:[id number]  Hem/Onc   Subjective: Pt started Belzutifan yesterday, more fatigue today, no nausea, diarrhea, or other noticeable side effect.  She underwent left thoracentesis yesterday with 520 mL fluid removed.  His dyspnea has slightly improved.  Also noticed some dizziness when he stands up.  He has not been out of bed much.   Objective:  Vitals:   10/16/22 1112 10/16/22 1533  BP: 106/83   Pulse: 94   Resp: (!) 22 18  Temp: 98.3 F (36.8 C) 97.6 F (36.4 C)  SpO2: 93%     Body mass index is 20.66 kg/m.  Intake/Output Summary (Last 24 hours) at 10/16/2022 1623 Last data filed at 10/16/2022 1200 Gross per 24 hour  Intake 642 ml  Output 0 ml  Net 642 ml     Sclerae unicteric, cachectic appearing  Oropharynx clear  No peripheral adenopathy  Lungs clear, decreased breath sounds on left side  Heart regular rate and rhythm  Abdomen soft, slightly distended  MSK no focal spinal tenderness, no peripheral edema  Neuro nonfocal    CBG (last 3)  No results for input(s): "GLUCAP" in the last 72 hours.   Labs:  Urine Studies No results for input(s): "UHGB", "CRYS" in the last 72 hours.  Invalid input(s): "UACOL", "UAPR", "USPG", "UPH", "UTP", "UGL", "UKET", "UBIL", "UNIT", "UROB", "ULEU", "UEPI", "UWBC", "URBC", "UBAC", "CAST", "UCOM", "BILUA"  Basic Metabolic Panel: Recent Labs  Lab 10/10/22 0848 10/13/22 0310 10/13/22 0316 10/13/22 0530 10/14/22 0023 10/15/22 0157 10/16/22 0428  NA 130* 122* 123*  123*  --  125* 125* 122*  K 4.5 5.0 5.0  5.0  --  4.8 4.2 4.4  CL 96* 89* 94*  --  92* 88* 87*  CO2 22 19*  --   --  20* 22 21*  GLUCOSE 130* 120* 121*  --  124* 105* 118*  BUN 21* 22* 25*  --  22* 19 20  CREATININE 1.23 1.23 1.20  --  0.85 0.98 0.84  CALCIUM 8.2* 8.5*  --   --  8.2* 8.6* 8.4*  MG  --   --   --   --  1.8  --   --   PHOS  --   --   --  3.3  --   --   --    GFR Estimated Creatinine Clearance: 93.3  mL/min (by C-G formula based on SCr of 0.84 mg/dL). Liver Function Tests: Recent Labs  Lab 10/13/22 0310  AST 41  ALT 24  ALKPHOS 162*  BILITOT 0.7  PROT 6.0*  ALBUMIN 1.9*   No results for input(s): "LIPASE", "AMYLASE" in the last 168 hours.  No results for input(s): "AMMONIA" in the last 168 hours. Coagulation profile No results for input(s): "INR", "PROTIME" in the last 168 hours.   CBC: Recent Labs  Lab 10/13/22 0310 10/13/22 0316 10/14/22 0023 10/15/22 0157 10/16/22 0428  WBC 7.4  --  6.3 6.3 6.5  NEUTROABS 6.1  --   --   --   --   HGB 14.6 15.3  15.0 13.2 12.7* 13.6  HCT 42.5 45.0  44.0 39.0 36.9* 39.4  MCV 80.8  --  80.7 81.3 81.2  PLT 190  --  199 193 206   Cardiac Enzymes: No results for input(s): "CKTOTAL", "CKMB", "CKMBINDEX", "TROPONINI" in the last 168 hours. BNP: Invalid input(s): "POCBNP" CBG: No results for input(s): "GLUCAP" in the last 168 hours. D-Dimer No  results for input(s): "DDIMER" in the last 72 hours. Hgb A1c No results for input(s): "HGBA1C" in the last 72 hours. Lipid Profile No results for input(s): "CHOL", "HDL", "LDLCALC", "TRIG", "CHOLHDL", "LDLDIRECT" in the last 72 hours. Thyroid function studies No results for input(s): "TSH", "T4TOTAL", "T3FREE", "THYROIDAB" in the last 72 hours.  Invalid input(s): "FREET3" Anemia work up No results for input(s): "VITAMINB12", "FOLATE", "FERRITIN", "TIBC", "IRON", "RETICCTPCT" in the last 72 hours. Microbiology Recent Results (from the past 240 hour(s))  Body fluid culture w Gram Stain     Status: None   Collection Time: 10/08/22  4:08 PM   Specimen: PATH Cytology Peritoneal fluid  Result Value Ref Range Status   Specimen Description   Final    PERITONEAL Performed at [facility], [address], [place], [place] [address]    Special Requests   Final    NONE Performed at [facility], [address], [place], [place] [address]    Gram Stain   Final    RARE WBC PRESENT,  PREDOMINANTLY PMN NO ORGANISMS SEEN    Culture   Final    NO GROWTH 3 DAYS Performed at [facility], [address], [place], [place] [address]    Report Status 10/11/2022 FINAL  Final  Body fluid culture w Gram Stain     Status: None   Collection Time: 10/09/22 10:56 AM   Specimen: Pleura; Body Fluid  Result Value Ref Range Status   Specimen Description PLEURAL  Final   Special Requests NONE  Final   Gram Stain RARE WBC SEEN NO ORGANISMS SEEN   Final   Culture   Final    NO GROWTH 3 DAYS Performed at [facility], [address], [place], [place] [address]    Report Status 10/12/2022 FINAL  Final  SARS Coronavirus 2 by RT PCR (hospital order, performed in [facility] hospital lab) *cepheid single result test* Anterior Nasal Swab     Status: None   Collection Time: 10/13/22  8:12 AM   Specimen: Anterior Nasal Swab  Result Value Ref Range Status   SARS Coronavirus 2 by RT PCR NEGATIVE NEGATIVE Final    Comment: Performed at [facility], [address], [place], [place] [address]  Respiratory (~20 pathogens) panel by PCR     Status: None   Collection Time: 10/13/22  9:05 AM   Specimen: Anterior Nasal Swab; Respiratory  Result Value Ref Range Status   Adenovirus NOT DETECTED NOT DETECTED Final   Coronavirus 229E NOT DETECTED NOT DETECTED Final    Comment: (NOTE) The Coronavirus on the Respiratory Panel, DOES NOT test for the novel  Coronavirus (2019 nCoV)    Coronavirus HKU1 NOT DETECTED NOT DETECTED Final   Coronavirus NL63 NOT DETECTED NOT DETECTED Final   Coronavirus OC43 NOT DETECTED NOT DETECTED Final   Metapneumovirus NOT DETECTED NOT DETECTED Final   Rhinovirus / Enterovirus NOT DETECTED NOT DETECTED Final   Influenza A NOT DETECTED NOT DETECTED Final   Influenza B NOT DETECTED NOT DETECTED Final   Parainfluenza Virus 1 NOT DETECTED NOT DETECTED Final   Parainfluenza Virus 2 NOT DETECTED NOT DETECTED Final   Parainfluenza Virus 3 NOT DETECTED NOT  DETECTED Final   Parainfluenza Virus 4 NOT DETECTED NOT DETECTED Final   Respiratory Syncytial Virus NOT DETECTED NOT DETECTED Final   Bordetella pertussis NOT DETECTED NOT DETECTED Final   Bordetella Parapertussis NOT DETECTED NOT DETECTED Final   Chlamydophila pneumoniae  NOT DETECTED NOT DETECTED Final   Mycoplasma pneumoniae NOT DETECTED NOT DETECTED Final    Comment: Performed at [facility], [address], [place], [place] [address]  Body fluid culture w Gram Stain     Status: None (Preliminary result)   Collection Time: 10/14/22  3:14 PM   Specimen: Abdomen; Peritoneal Fluid  Result Value Ref Range Status   Specimen Description FLUID PERITONEAL ABDOMEN  Final   Special Requests NONE  Final   Gram Stain NO WBC SEEN NO ORGANISMS SEEN   Final   Culture   Final    NO GROWTH 2 DAYS Performed at [facility], [address] [place], [place] [address]    Report Status PENDING  Incomplete  Body fluid culture w Gram Stain     Status: None (Preliminary result)   Collection Time: 10/15/22  1:32 PM   Specimen: PATH Cytology Pleural fluid  Result Value Ref Range Status   Specimen Description PLEURAL  Final   Special Requests NONE  Final   Gram Stain   Final    RARE WBC PRESENT,BOTH PMN AND MONONUCLEAR NO ORGANISMS SEEN    Culture   Final    NO GROWTH < 24 HOURS Performed at [facility], [address], [place], [place] [address]    Report Status PENDING  Incomplete      Studies:  DG Chest Port 1 View  Result Date: 10/15/2022 CLINICAL DATA:  Pleural effusion on the left. Status post left thoracentesis. EXAM: PORTABLE CHEST 1 VIEW COMPARISON:  10/13/2022 FINDINGS: Stable position of the right jugular Port-A-Cath with the tip near the superior cavoatrial junction. Again noted are diffuse interstitial lung densities, right side slightly greater than left. Slightly improved aeration at the left lung base compared to the recent comparison examination. Probable  small bilateral pleural effusions or blunting at the costophrenic angles. Negative for a pneumothorax. Heart size is normal. Trachea is midline. IMPRESSION: 1. Negative for a pneumothorax following the left thoracentesis. 2. Slightly improved aeration at the left lung base compared to the recent comparison examination. 3. Cannot exclude small bilateral pleural effusions. 4. Persistent prominent interstitial lung markings. Electronically Signed   By: Richarda Overlie M.D.   On: 10/15/2022 14:11   US THORACENTESIS ASP PLEURAL SPACE W/IMG GUIDE  Result Date: 10/15/2022 INDICATION: Patient with history of renal cell carcinoma, recurrent ascites and left pleural effusion. Request for diagnostic and therapeutic left thoracentesis. EXAM: ULTRASOUND GUIDED LEFT THORACENTESIS MEDICATIONS: 5 mL 1% lidocaine COMPLICATIONS: None immediate. PROCEDURE: An ultrasound guided thoracentesis was thoroughly discussed with the patient and questions answered. The benefits, risks, alternatives and complications were also discussed. The patient understands and wishes to proceed with the procedure. Written consent was obtained. Ultrasound was performed to localize and mark an adequate pocket of fluid in the left chest. The area was then prepped and draped in the normal sterile fashion. 1% Lidocaine was used for local anesthesia. Under ultrasound guidance a 6 Fr Safe-T-Centesis catheter was introduced. Thoracentesis was performed. The catheter was removed and a dressing applied. FINDINGS: A total of approximately 520 mL of cloudy yellow, chylous appearing fluid was removed. Samples were sent to the laboratory as requested by the clinical team. IMPRESSION: Successful ultrasound guided left thoracentesis yielding 520 mL of pleural fluid. Performed by Lynnette Caffey, PA-C Electronically Signed   By: Simonne Come M.D.   On: 10/15/2022 14:10    Assessment: 56 y.o. male  Dysphagia and dyspnea, probably secondary to  left pleural effusion,  questionable pneumonia Metastatic renal cell carcinoma to bones Recently developed large volume ascites and left pleural effusion, malignant Chronic hyponatremia Hypertension Deconditioning Severe protein and calorie malnutrition    Plan:  -Continue Belzutifan  120mg  daily -Continue supportive care. -He is excited is seem to be increasing rapidly, he may need another paracentesis -I will follow-up as needed during his hospital stay, please call me if anything I can help. -He will follow-up with Dr. Ellin Saba after discharge   Malachy Mood, MD 10/16/2022  4:23 PM

## 2022-10-16 NOTE — Consult Note (Signed)
Renal Service Consult Note [facility]  KENSUKE PANZARELLA 10/16/2022 Maree Krabbe, MD Requesting Physician: Dr. Isidoro Donning  Reason for Consult: Hyponatremia HPI: The patient is a 57 y.o. year-old w/ PMH as below who presented 5/02 to ED w/ SOB and "throat closing". Received Epi nebs treatment w/ EMS. H/o metastatic renal cell cancer. Recent hospital stay 4/27- 4/29 for intractable N/V and abd pain, ascites and pleural effusion and he needed paracentesis and thoracentesis. Palliative care consulted. In ED SpO2 were wnl, Na 122 CO2 19 BUN 22 creat 1.23. Ca 8.5, VBG w/o hypercapnia. Neck xrays negative, CXR showed diffuse IS GG opacities. BP's were 115/90, HR 93-101, RR 15- 21. On RA w/ 96% SpO2. 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 Pt was admitted. UOsm was 758, UN < 10, calc serum osm 259. He was given IV albumin on 5/03, po norvasc, IV rocephin and IV doxycycline, prn dilaudid po. Over the last 4 days, HR's have slowed down to 90- 100s, BP's staying 116/ 87 range, RR up and down. Afebrile. Na+ levels improved up to 125 on 5/03 and 5/04, then was down to 122 today. We are asked to see for hyponatremia.   Pt seen in room. Has lost a lot of weight. States his urine is a bit dark, not cloudy and no LUTS. No SOB, no LE edema. No hx renal filaure.     ROS - denies CP, no joint pain, no HA, no blurry vision, no rash, no diarrhea, no nausea/ vomiting, no dysuria, no difficulty voiding   Past Medical History  Past Medical History:  Diagnosis Date   Cancer (HCC)    Plantar fasciitis    Port-A-Cath in place 11/10/2021   Past Surgical History  Past Surgical History:  Procedure Laterality Date   IR IMAGING GUIDED PORT INSERTION  10/15/2021   IR PARACENTESIS  10/14/2022   Family History  Family History  Problem Relation Age of Onset   Hypertension Mother     Parkinson's disease Father    Breast cancer Maternal Aunt        dx >50   Lung cancer Paternal Grandfather    Breast cancer Cousin        dx 56s   Prostate cancer Cousin        dx 68s   Social History  reports that he has been smoking cigarettes. He started smoking about 44 years ago. He has a 7.20 pack-year smoking history. He has never used smokeless tobacco. He reports that he does not currently use alcohol after a past usage of about 7.0 standard drinks of alcohol per week. He reports that he does not currently use drugs after having used the following drugs: Marijuana. Frequency: 1.00 time per week. Allergies  Allergies  Allergen Reactions   Iodinated Contrast Media Anaphylaxis    Throat felt like closed Was in hospital for allergy and almost died- per patient  Pt verified that it was from CT contrast   Iodine     Throat felt like closed Was in hospital for allergy and almost died- per patient    Home medications Prior to Admission medications   Medication Sig Start Date End Date Taking? Authorizing Provider  albuterol (VENTOLIN HFA) 108 (90 Base) MCG/ACT inhaler Inhale 2 puffs into the lungs every 6 (six) hours as needed for wheezing or shortness of breath. 08/08/22  Yes [name]  M, FNP  amLODipine (NORVASC) 10 MG tablet Take 1 tablet (10 mg total) by mouth daily. 09/20/22  Yes Doreatha Massed, MD  Budeson-Glycopyrrol-Formoterol (BREZTRI AEROSPHERE) 160-9-4.8 MCG/ACT AERO Inhale 2 puffs into the lungs 2 (two) times daily. 08/08/22  Yes [name], FNP  HYDROcodone-acetaminophen (NORCO) 10-325 MG tablet Take 1 tablet by mouth every 12 (twelve) hours as needed. Patient taking differently: Take 1 tablet by mouth every 12 (twelve) hours as needed for moderate pain or severe pain. 09/05/22  Yes Doreatha Massed, MD  HYDROmorphone (DILAUDID) 2 MG tablet Take 1 tablet (2 mg total) by mouth every 6 (six) hours as needed for severe pain. 10/11/22  Yes Doreatha Massed,  MD  loperamide (IMODIUM) 2 MG capsule Take 1 capsule (2 mg total) by mouth as needed for diarrhea or loose stools. 08/26/22  Yes Pennington, Rebekah M, PA-C  ondansetron (ZOFRAN-ODT) 8 MG disintegrating tablet Take 1 tablet (8 mg total) by mouth every 8 (eight) hours as needed for nausea or vomiting. 09/20/22  Yes Doreatha Massed, MD  rosuvastatin (CRESTOR) 5 MG tablet Take 1 tablet (5 mg total) by mouth daily. 08/08/22  Yes [name], FNP  belzutifan [facility]) 40 MG tablet Take 3 tablets (120 mg total) by mouth daily. 10/10/22   Doreatha Massed, MD  magnesium oxide (MAG-OX) 400 (240 Mg) MG tablet Take 1 tablet (400 mg total) by mouth daily. Patient not taking: Reported on 10/13/2022 10/04/22   Doreatha Massed, MD  potassium chloride SA (KLOR-CON M) 20 MEQ tablet Take 1 tablet (20 mEq total) by mouth 2 (two) times daily. Patient not taking: Reported on 10/13/2022 10/04/22   Doreatha Massed, MD     Vitals:   10/16/22 9811 10/16/22 1112 10/16/22 1533 10/16/22 2034  BP:  106/83  106/89  Pulse:  94  99  Resp:  (!) 22 18 (!) 24  Temp:  98.3 F (36.8 C) 97.6 F (36.4 C) 97.6 F (36.4 C)  TempSrc:  Axillary Oral Oral  SpO2: 99% 93%  92%  Weight:      Height:       Exam Gen alert, no distress No rash, cyanosis or gangrene Sclera anicteric, throat clear  No jvd or bruits Chest clear bilat to bases, no rales/ wheezing RRR no MRG Abd soft ntnd no mass, 3+ ascites , tight, +bs GU normal male MS no joint effusions or deformity Ext no LE or UE edema, no wounds or ulcers Neuro is alert, Ox 3 , nf      CXR 5/4 - IMPRESSION: 1. Negative for a pneumothorax following the left thoracentesis. 2. Slightly improved aeration at the left lung base compared to the recent comparison examination. 3. Cannot exclude small bilateral pleural effusions. 4. Persistent prominent interstitial lung markings.   Home meds include - albuterol prn, breztri aeroshpere, zofran, klor-con 20 bid,  norvasc 10 qd, norco prn, hydromorphone 2mg  qid prn, imodium prn, crestor, belzutifan, mg O2, prns/ vits/ supps    UA 4/27  - cloudy, ket 5, prot 100, 6-10 rbc, 21-50 wbc, no bacteria   5/02 UNa  < 10   5/02 UOsm  759   Assessment/ Plan: Hyponatremia - exam is not sig for volume overload, looks euvolemic to a bit dry except for tight ascites 3+. No LE edema. CXR clear on exam, coarse IS markings by CXR. May have lymphangitic spread. UA is non-descript. UNa is < 10 which goes against SIADH (should be normal, not low w/ SIADH). Low UNa could be vol  depletion, poor cardiac function or hepatorenal equivalent caused by severe ascites (in this case malignant ascites). High UOsm can be vol depletion and /or SIADH. Will try vol depletion 1st by giving isotonic saline and see if this helps overnight. Will get echo to r/o low EF. For HRS equivalent could consider octreotide or IV albumin. BP's don't look low enough for midodrine. Will follow.  CAP - getting IV abx for suspected PNA Stage IV RCC w/ metastases - poor prognosis, cachectic Malignant ascites - recurrent problem, sp paracentesis HTN - getting norvasc 10 qd      Vinson Moselle  MD [facility] 10/16/2022, 9:42 PM  Recent Labs  Lab 10/13/22 0310 10/13/22 0316 10/13/22 0530 10/14/22 0023 10/15/22 0157 10/16/22 0428  HGB 14.6   < >  --    < > 12.7* 13.6  ALBUMIN 1.9*  --   --   --   --   --   CALCIUM 8.5*  --   --    < > 8.6* 8.4*  PHOS  --   --  3.3  --   --   --   CREATININE 1.23   < >  --    < > 0.98 0.84  K 5.0   < >  --    < > 4.2 4.4   < > = values in this interval not displayed.   Inpatient medications:  amLODipine  10 mg Oral Daily   belzutifan  120 mg Oral Daily   Chlorhexidine Gluconate Cloth  6 each Topical Daily   doxycycline  100 mg Oral Q12H   enoxaparin (LOVENOX) injection  40 mg Subcutaneous Q24H   feeding supplement  237 mL Oral BID BM   fluticasone furoate-vilanterol  1 puff Inhalation Daily   lidocaine  2 patch  Transdermal Q24H   magnesium oxide  400 mg Oral Daily   rosuvastatin  5 mg Oral Daily   sodium chloride flush  10-40 mL Intracatheter Q12H   sodium chloride flush  3 mL Intravenous Q12H   umeclidinium bromide  1 puff Inhalation Daily    cefTRIAXone (ROCEPHIN)  IV 2 g (10/16/22 2120)   acetaminophen **OR** acetaminophen, albuterol, EPINEPHrine, HYDROmorphone, loperamide, sodium chloride flush, trimethobenzamide

## 2022-10-17 ENCOUNTER — Inpatient Hospital Stay (HOSPITAL_COMMUNITY): Payer: 59

## 2022-10-17 ENCOUNTER — Other Ambulatory Visit: Payer: Self-pay | Admitting: *Deleted

## 2022-10-17 DIAGNOSIS — R0602 Shortness of breath: Secondary | ICD-10-CM | POA: Diagnosis not present

## 2022-10-17 DIAGNOSIS — E871 Hypo-osmolality and hyponatremia: Secondary | ICD-10-CM | POA: Diagnosis not present

## 2022-10-17 DIAGNOSIS — I509 Heart failure, unspecified: Secondary | ICD-10-CM | POA: Diagnosis not present

## 2022-10-17 DIAGNOSIS — J189 Pneumonia, unspecified organism: Secondary | ICD-10-CM | POA: Diagnosis not present

## 2022-10-17 DIAGNOSIS — R0609 Other forms of dyspnea: Secondary | ICD-10-CM | POA: Diagnosis not present

## 2022-10-17 DIAGNOSIS — R180 Malignant ascites: Secondary | ICD-10-CM

## 2022-10-17 LAB — COMPREHENSIVE METABOLIC PANEL
ALT: 21 U/L (ref 0–44)
AST: 26 U/L (ref 15–41)
Albumin: 1.9 g/dL — ABNORMAL LOW (ref 3.5–5.0)
Alkaline Phosphatase: 176 U/L — ABNORMAL HIGH (ref 38–126)
Anion gap: 15 (ref 5–15)
BUN: 23 mg/dL — ABNORMAL HIGH (ref 6–20)
CO2: 21 mmol/L — ABNORMAL LOW (ref 22–32)
Calcium: 8.4 mg/dL — ABNORMAL LOW (ref 8.9–10.3)
Chloride: 89 mmol/L — ABNORMAL LOW (ref 98–111)
Creatinine, Ser: 0.9 mg/dL (ref 0.61–1.24)
GFR, Estimated: >60 mL/min (ref >60)
Glucose, Bld: 105 mg/dL — ABNORMAL HIGH (ref 70–99)
Potassium: 4.9 mmol/L (ref 3.5–5.1)
Sodium: 125 mmol/L — ABNORMAL LOW (ref 135–145)
Total Bilirubin: 0.3 mg/dL (ref 0.3–1.2)
Total Protein: 5.5 g/dL — ABNORMAL LOW (ref 6.5–8.1)

## 2022-10-17 LAB — PATHOLOGIST SMEAR REVIEW

## 2022-10-17 LAB — CBC
HCT: 39.5 % (ref 39.0–52.0)
Hemoglobin: 13.1 g/dL (ref 13.0–17.0)
MCH: 27.6 pg (ref 26.0–34.0)
MCHC: 33.2 g/dL (ref 30.0–36.0)
MCV: 83.3 fL (ref 80.0–100.0)
Platelets: 222 10*3/uL (ref 150–400)
RBC: 4.74 MIL/uL (ref 4.22–5.81)
RDW: 24.8 % — ABNORMAL HIGH (ref 11.5–15.5)
WBC: 7.2 10*3/uL (ref 4.0–10.5)
nRBC: 0 % (ref 0.0–0.2)

## 2022-10-17 LAB — BODY FLUID CULTURE W GRAM STAIN: Gram Stain: NONE SEEN

## 2022-10-17 LAB — PH, BODY FLUID: pH, Body Fluid: 7.4

## 2022-10-17 LAB — ECHOCARDIOGRAM COMPLETE
Area-P 1/2: 4.12 cm2
Calc EF: 56.7 %
Height: 71 in
S' Lateral: 2.5 cm
Single Plane A2C EF: 56.1 %
Single Plane A4C EF: 55.6 %
Weight: 2370.39 oz

## 2022-10-17 LAB — SODIUM: Sodium: 124 mmol/L — ABNORMAL LOW (ref 135–145)

## 2022-10-17 MED ORDER — PERFLUTREN LIPID MICROSPHERE
1.0000 mL | INTRAVENOUS | Status: AC | PRN
  Administered 2022-10-17: 5 mL via INTRAVENOUS

## 2022-10-17 NOTE — TOC Progression Note (Signed)
Transition of Care [facility]) - Progression Note    Patient Details  Name: Jacob Reynolds MRN: [id number] Date of Birth: [birth year]  [facility]) CM/SW Contact  Harriet Masson, RN Phone Number: 10/17/2022, 1:02 PM  Clinical Narrative:    Spoke to patient at bedside regarding transition needs.  Patient is agreeable to home health.  Choice offered and patient defers to this RNCM to find highly rated agency.  Amy with [facility] accepted referral.  Address, Phone number and PCP verified. Need home health PT orders.    Expected Discharge Plan: Home w Home Health Services Barriers to Discharge: Continued Medical Work up  Expected Discharge Plan and Services   Discharge Planning Services: CM Consult   Living arrangements for the past 2 months: Single Family Home                 DME Arranged:  (Await PT recommendations)         HH Arranged: PT HH Agency: [facility] Date HH Agency Contacted: 10/17/22 Time HH Agency Contacted: 1257 Representative spoke with at [facility] Agency: Amy   Social Determinants of Health (SDOH) Interventions SDOH Screenings   Depression (PHQ2-9): Medium Risk (08/08/2022)  Tobacco Use: High Risk (10/16/2022)    Readmission Risk Interventions     No data to display

## 2022-10-17 NOTE — Plan of Care (Signed)
  Problem: Coping: Goal: Level of anxiety will decrease Outcome: Progressing   Problem: Pain Managment: Goal: General experience of comfort will improve Outcome: Progressing   Problem: Safety: Goal: Ability to remain free from injury will improve Outcome: Progressing   

## 2022-10-17 NOTE — Progress Notes (Signed)
Nephrology Follow-Up Consult note   Assessment/Recommendations: Jacob Reynolds is a/an 57 y.o. male with a past medical history significant for stage IV renal cancer admitted for hyponatremia.       Hypervolemic hyponatremia: In the setting of volume overload with low urine sodium.  Likely some form of HRS physiology -Agree with paracentesis -Consider urea -Likely start diuretics tomorrow after paracentesis to prevent reaccumulation -Primary team and oncology to consider palliative peritoneal drain -Stop IV fluids -Consider IV albumin -Monitor sodium twice daily for now  Recurrent ascites: Status post paracentesis on 5/3.  Now with recurrence.  Consulting IR for repeat paracentesis.  Considering palliative drain.  Will consider diuretics tomorrow  Metastatic cancer: Renal cell.  Management per oncology  Hypertension: Given low blood pressures we will stop amlodipine    Recommendations conveyed to primary service.    [name] [facility] 10/17/2022 9:08 AM  ___________________________________________________________  CC: ascites, hyponatremia  Interval History/Subjective: Patient feels okay with some tense abdomen.    Medications:  Current Facility-Administered Medications  Medication Dose Route Frequency Provider Last Rate Last Admin   acetaminophen (TYLENOL) tablet 650 mg  650 mg Oral Q6H PRN Madelyn Flavors A, MD   650 mg at 10/17/22 4098   Or   acetaminophen (TYLENOL) suppository 650 mg  650 mg Rectal Q6H PRN Madelyn Flavors A, MD       albuterol (PROVENTIL) (2.5 MG/3ML) 0.083% nebulizer solution 2.5 mg  2.5 mg Nebulization Q2H PRN Clydie Braun, MD       belzutifan [facility]) tablet 120 mg  120 mg Oral Daily Malachy Mood, MD   120 mg at 10/16/22 1118   cefTRIAXone (ROCEPHIN) 2 g in sodium chloride 0.9 % 100 mL IVPB  2 g Intravenous Q24H Clydie Braun, MD   Stopped at 10/16/22 2341   Chlorhexidine Gluconate Cloth 2 % PADS 6 each  6 each Topical  Daily Madelyn Flavors A, MD   6 each at 10/16/22 1020   doxycycline (VIBRA-TABS) tablet 100 mg  100 mg Oral Q12H Leander Rams, RPH   100 mg at 10/16/22 2114   enoxaparin (LOVENOX) injection 40 mg  40 mg Subcutaneous Q24H Madelyn Flavors A, MD   40 mg at 10/17/22 0758   EPINEPHrine (EPI-PEN) injection 0.3 mg  0.3 mg Intramuscular Once PRN Tilden Fossa, MD       feeding supplement (ENSURE ENLIVE / ENSURE PLUS) liquid 237 mL  237 mL Oral BID BM Opyd, Lavone Neri, MD   237 mL at 10/16/22 1602   fluticasone furoate-vilanterol (BREO ELLIPTA) 200-25 MCG/ACT 1 puff  1 puff Inhalation Daily Rai, Ripudeep K, MD   1 puff at 10/17/22 0817   HYDROmorphone (DILAUDID) tablet 2 mg  2 mg Oral Q6H PRN Madelyn Flavors A, MD   2 mg at 10/16/22 2358   lidocaine (LIDODERM) 5 % 2 patch  2 patch Transdermal Q24H Madelyn Flavors A, MD   2 patch at 10/15/22 1059   loperamide (IMODIUM) capsule 2 mg  2 mg Oral PRN Madelyn Flavors A, MD       magnesium oxide (MAG-OX) tablet 400 mg  400 mg Oral Daily Rai, Ripudeep K, MD   400 mg at 10/16/22 0900   rosuvastatin (CRESTOR) tablet 5 mg  5 mg Oral Daily Smith, Rondell A, MD   5 mg at 10/16/22 0900   sodium chloride flush (NS) 0.9 % injection 10-40 mL  10-40 mL Intracatheter Q12H Smith, Rondell A, MD   10 mL  at 10/16/22 2209   sodium chloride flush (NS) 0.9 % injection 10-40 mL  10-40 mL Intracatheter PRN Madelyn Flavors A, MD       sodium chloride flush (NS) 0.9 % injection 3 mL  3 mL Intravenous Q12H Smith, Rondell A, MD   3 mL at 10/16/22 1104   trimethobenzamide (TIGAN) injection 200 mg  200 mg Intramuscular Q6H PRN Madelyn Flavors A, MD       umeclidinium bromide (INCRUSE ELLIPTA) 62.5 MCG/ACT 1 puff  1 puff Inhalation Daily Rai, Ripudeep K, MD   1 puff at 10/17/22 0816   Facility-Administered Medications Ordered in Other Encounters  Medication Dose Route Frequency Provider Last Rate Last Admin   diphenhydrAMINE (BENADRYL) 50 MG/ML injection            famotidine (PEPCID) 20-0.9  MG/50ML-% IVPB               Review of Systems: 10 systems reviewed and negative except per interval history/subjective  Physical Exam: Vitals:   10/17/22 0331 10/17/22 0750  BP: 114/84   Pulse: 87   Resp: 19   Temp: 97.8 F (36.6 C) 97.8 F (36.6 C)  SpO2: 92%    No intake/output data recorded.  Intake/Output Summary (Last 24 hours) at 10/17/2022 0908 Last data filed at 10/17/2022 0400 Gross per 24 hour  Intake 1062.06 ml  Output 100 ml  Net 962.06 ml   Constitutional: chronically ill appearing ENMT: ears and nose without scars or lesions, MMM CV: normal rate, no edema Respiratory: bilateral chest rise, normal work of breathing Gastrointestinal: distended, nontender Skin: no visible lesions or rashes Psych: alert, judgement/insight appropriate, appropriate mood and affect   Test Results I personally reviewed new and old clinical labs and radiology tests Lab Results  Component Value Date   NA 125 (L) 10/17/2022   K 4.9 10/17/2022   CL 89 (L) 10/17/2022   CO2 21 (L) 10/17/2022   BUN 23 (H) 10/17/2022   CREATININE 0.90 10/17/2022   CALCIUM 8.4 (L) 10/17/2022   ALBUMIN 1.9 (L) 10/17/2022   PHOS 3.3 10/13/2022    CBC Recent Labs  Lab 10/13/22 0310 10/13/22 0316 10/15/22 0157 10/16/22 0428 10/17/22 0340  WBC 7.4   < > 6.3 6.5 7.2  NEUTROABS 6.1  --   --   --   --   HGB 14.6   < > 12.7* 13.6 13.1  HCT 42.5   < > 36.9* 39.4 39.5  MCV 80.8   < > 81.3 81.2 83.3  PLT 190   < > 193 206 222   < > = values in this interval not displayed.

## 2022-10-17 NOTE — Progress Notes (Signed)
Physical Therapy Treatment Patient Details Name: Jacob Reynolds MRN: [id number] DOB: [birth year] Today's Date: 10/17/2022   History of Present Illness Pt is 57 year old presented to [facility] on  10/13/22 for SOB and feeling of throat closing. Possible PNA.  Parencentesis of 6.1L on admission. PMH - stage IV renal cell CA with bone mets and malignant ascites, HTN    PT Comments    Pt finishing in bathroom as PT entered. Pt reports friends coming and he is hopeful to get some rest before they come. Agreeable to go over HEP. Pt able to perform exercises each a few times. Left printed copy of exercises. PT will follow back tomorrow as able. D/c plan remains appropriate.  Access Code: [email or web address] URL: [email or web address] Date: 10/17/2022 Prepared by: Lanora Manis  Exercises - Supine Heel Slides  - 1x daily - 7 x weekly - 3 sets - 10 reps - Supine Hip Abduction  - 1 x daily - 7 x weekly - 3 sets - 10 reps - Straight Leg Raise  - 1 x daily - 7 x weekly - 3 sets - 10 reps - Seated Heel Toe Raises   - 1 x daily - 7 x weekly - 3 sets - 10 reps - 5 hold - Seated Long Arc Quad  - 1x daily - 7 x weekly - 3 sets - 10 reps - Seated March  - 1x daily - 7 x weekly - 3 sets - 10 reps - Seated Shoulder Flexion Full Range Single Arm  - 1 x daily - 7 x weekly - 3 sets - 10 reps - Seated Single Arm Shoulder Abduction  - 1 x daily - 7 x weekly - 3 sets - 10 reps - Seated Elbow Flexion Extension AROM  - 1 x daily - 7 x weekly - 3 sets - 10 reps   Recommendations for follow up therapy are one component of a multi-disciplinary discharge planning process, led by the attending physician.  Recommendations may be updated based on patient status, additional functional criteria and insurance authorization.     Assistance Recommended at Discharge PRN  Patient can return home with the following Assist for transportation   Equipment Recommendations  None recommended by PT       Precautions / Restrictions  Precautions Precautions: None     Mobility  Bed Mobility Overal bed mobility: Modified Independent                  Transfers Overall transfer level: Modified independent Equipment used: None                    Ambulation/Gait Ambulation/Gait assistance: Modified independent (Device/Increase time) Gait Distance (Feet): 10 Feet Assistive device: None Gait Pattern/deviations: Step-through pattern, Decreased stride length, Wide base of support Gait velocity: decr Gait velocity interpretation: <1.8 ft/sec, indicate of risk for recurrent falls   General Gait Details: Steady gait         Balance Overall balance assessment: Mild deficits observed, not formally tested                                          Cognition Arousal/Alertness: Awake/alert Behavior During Therapy: WFL for tasks assessed/performed Overall Cognitive Status: Within Functional Limits for tasks assessed  Exercises  See above    General Comments General comments (skin integrity, edema, etc.): VSS on RA      Pertinent Vitals/Pain Pain Assessment Pain Assessment: Faces Faces Pain Scale: Hurts little more Pain Location: back Pain Descriptors / Indicators: Grimacing     PT Goals (current goals can now be found in the care plan section) Acute Rehab PT Goals Patient Stated Goal: get stronger PT Goal Formulation: With patient Time For Goal Achievement: 10/21/22 Potential to Achieve Goals: Good Progress towards PT goals: PT to reassess next treatment    Frequency    Min 3X/week      PT Plan Current plan remains appropriate       AM-PAC PT "6 Clicks" Mobility   Outcome Measure  Help needed turning from your back to your side while in a flat bed without using bedrails?: None Help needed moving from lying on your back to sitting on the side of a flat bed without using bedrails?: None Help needed  moving to and from a bed to a chair (including a wheelchair)?: None Help needed standing up from a chair using your arms (e.g., wheelchair or bedside chair)?: None Help needed to walk in hospital room?: None Help needed climbing 3-5 steps with a railing? : A Little 6 Click Score: 23    End of Session   Activity Tolerance: Patient tolerated treatment well Patient left: with call bell/phone within reach;in bed   PT Visit Diagnosis: Muscle weakness (generalized) (M62.81)     Time: 1308-6578 PT Time Calculation (min) (ACUTE ONLY): 10 min  Charges:  $Therapeutic Exercise: 8-22 mins                     Rafeef Lau B. Beverely Risen PT, DPT Acute Rehabilitation Services Please use secure chat or  Call Office [phone]    Elon Alas [facility] 10/17/2022, 3:57 PM

## 2022-10-17 NOTE — Progress Notes (Signed)
Echocardiogram 2D Echocardiogram has been performed.  Toni Amend 10/17/2022, 10:18 AM

## 2022-10-17 NOTE — Progress Notes (Addendum)
[facility]                                                                              Jacob Reynolds, is a 57 y.o. 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  He questioned if it was an allergic reaction as he was on several medications. He had not darted belzutifan 120 mg daily he had as he was supposed to start today. Patient just recently hospitalized 4/27-4/29 with intractable nausea, vomiting, and abdominal Per patient, since being home he had increased abdominal swelling, dry mouth.  His throat symptoms improved in the ER however still felt some discomfort.  He previously had thrush.  Also reported nausea, back pain, fatigue, shortness of breath, substernal chest discomfort during the episode. En route with EMS, patient was given epinephrine nebulizer treatment with improvement in his symptoms  In ED, noted to be tachycardia, tachypnea, O2 sats on room air. Sodium 122, CO2 19, BUN 22, creatinine 1.23 X-rays of the neck negative.  Chest x-ray showed diffuse interstitial groundglass opacities, mildly increased, representing edema superimposed on interstitial changes.  VQ scan negative for PE Patient was admitted for further workup.  Significant events 5/3: Underwent paracentesis, 3.9 L removed.  Oncology consulted 5/4: Underwent thoracentesis, left, 520 cc removed.  Started on chemotherapy, Belzutifan 5/5: Sodium trending down, nephrology consulted  Assessment & Plan    Principal Problem:   SOB (shortness of breath), ?  Throat closing, probable CAP pneumonia -Presented with acute feeling of throat closing and shortness of breath.   -Chest x-ray  noted diffuse interstitial and groundglass opacities  -VQ scan negative for PE. -CT soft tissue neck showed no acute findings, left supraclavicular lymph nodes increased in size, left pleural effusion decreased. -Continue doxycycline and IV Rocephin.  Procalcitonin 0.63 -s/p left sided thoracentesis on 5/4.  520 cc of cloudy yellow chylous appearing fluid removed.  Sent for triglycerides. 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 Ellin Saba oncology, recently completed XRT. Recent CT scan confirmed progression of disease with left pleural effusion, ascites, and concerns for lymphangitic spread. He was supposed  to start on belzutifan on the day of admission - Underwent paracentesis 5/3, 3.9 L removed -Underwent left-sided thoracentesis on 5/4, 520 cc removed  -Appreciate oncology evaluation, started Belzutifan per  recommendations -d/w Oncology, Dr Mosetta Putt, PleurX in abdomen is generally not recommended by IR due to risk of infection.  Can be done outpatient 5 to 7 days. -Complaining of abdominal distention, shortness of breath and discomfort, will order another paracentesis - d/w Dr Ellin Saba, will arrange outpatient paracentesis at [facility] weekly.   Generalized debility -Lives alone and reports having to difficulty getting around. -PT OT evaluation   Prolonged QT interval - QTc was noted to be 690. -Avoid QT prolonging meds, follow K, Mag   Hypocalcemia -Patient  received 2 g IV calcium gluconate in ED   Essential hypertension -Continue amlodipine   Hyperlipidemia -Continue Crestor   Severe protein calorie malnutrition Albumin noted to be significantly low at 1.9 Estimated body mass index is 20.66 kg/m as calculated from the following:   Height as of this encounter: 5\' 11"  (1.803 m).   Weight as of this encounter: 67.2 kg.  Code Status: Full code DVT Prophylaxis:  enoxaparin (LOVENOX) injection 40 mg Start: 10/13/22 0815   Level of Care: Level of care: Progressive Family Communication: Updated patient Disposition Plan:      Remains inpatient appropriate:     Procedures:  Ultrasound-guided paracentesis 5/3  Consultants:   Oncology, Dr. Mosetta Putt Nephrology  Antimicrobials:   Anti-infectives (From admission, onward)    Start     Dose/Rate Route Frequency Ordered Stop   10/14/22 2200  doxycycline (VIBRA-TABS) tablet 100 mg        100 mg Oral Every 12 hours 10/14/22 1140 10/18/22 2159   10/13/22 2200  doxycycline (VIBRAMYCIN) 100 mg in sodium chloride 0.9 % 250 mL IVPB  Status:  Discontinued        100 mg 125 mL/hr over 120 Minutes Intravenous Every 12 hours 10/13/22 2050 10/14/22 1140   10/13/22 2100  cefTRIAXone (ROCEPHIN) 2 g in sodium chloride 0.9 % 100 mL IVPB        2 g 200 mL/hr over 30 Minutes Intravenous Every 24 hours 10/13/22 2048            Medications  belzutifan  120 mg Oral Daily   Chlorhexidine Gluconate Cloth  6 each Topical Daily   doxycycline  100 mg Oral Q12H   enoxaparin (LOVENOX) injection  40 mg Subcutaneous Q24H   feeding supplement  237 mL Oral BID BM   fluticasone furoate-vilanterol  1 puff Inhalation Daily   lidocaine  2 patch Transdermal Q24H   magnesium oxide  400 mg Oral Daily   rosuvastatin  5 mg Oral Daily   sodium chloride flush  10-40 mL Intracatheter Q12H   sodium chloride flush  3 mL Intravenous Q12H   umeclidinium bromide  1 puff Inhalation Daily      Subjective:   Jacob Reynolds  was seen and examined today.  Sitting up in the bed, complaining of abdominal discomfort, shortness of breath and abdominal distention.  Hoping to get paracentesis again.  No fevers or chills, cough, chest pain.     Objective:   Vitals:   10/16/22 2340 10/17/22 0331 10/17/22 0750 10/17/22 1152  BP: 108/85 114/84  103/81  Pulse: 91 87  92  Resp: 20 19  20   Temp: 97.8 F (36.6 C) 97.8 F (36.6 C) 97.8 F (36.6 C) 97.6 F (36.4 C)  TempSrc: Oral Oral Oral Oral  SpO2: 91% 92%  91%  Weight:      Height:        Intake/Output Summary (Last 24 hours) at 10/17/2022 1227 Last data filed  at 10/17/2022 7829 Gross per 24 hour  Intake 772.06 ml  Output 100 ml  Net 672.06 ml     Wt Readings from Last 3 Encounters:  10/13/22 67.2 kg  10/11/22 66.3 kg  10/09/22 72.7 kg   Physical Exam General: Alert and oriented x 3, NAD, ill-appearing, cachectic, uncomfortable Cardiovascular: S1 S2 clear, RRR.  Respiratory: Diminished breath sound bases Gastrointestinal: Soft, diffuse TTP, distended/ascites Ext: no pedal edema bilaterally Neuro: no new deficits Psych: Normal affect   Data Reviewed:  I have personally reviewed following labs    CBC Lab Results  Component Value Date   WBC 7.2 10/17/2022   RBC 4.74 10/17/2022   HGB 13.1 10/17/2022   HCT 39.5 10/17/2022   MCV 83.3 10/17/2022   MCH 27.6 10/17/2022   PLT 222 10/17/2022   MCHC 33.2 10/17/2022   RDW 24.8 (H) 10/17/2022   LYMPHSABS 0.6 (L) 10/13/2022   MONOABS 0.5 10/13/2022   EOSABS 0.1 10/13/2022   BASOSABS 0.1 10/13/2022     Last metabolic panel Lab Results  Component Value Date   NA 125 (L) 10/17/2022   K 4.9 10/17/2022   CL 89 (L) 10/17/2022   CO2 21 (L) 10/17/2022   BUN 23 (H) 10/17/2022   CREATININE 0.90 10/17/2022   GLUCOSE 105 (H) 10/17/2022   GFRNONAA >60 10/17/2022   GFRAA 119 09/05/2015   CALCIUM 8.4 (L) 10/17/2022   PHOS 3.3 10/13/2022   PROT 5.5 (L) 10/17/2022   ALBUMIN 1.9 (L) 10/17/2022   LABGLOB  3.0 08/05/2021   AGRATIO 1.5 08/05/2021   BILITOT 0.3 10/17/2022   ALKPHOS 176 (H) 10/17/2022   AST 26 10/17/2022   ALT 21 10/17/2022   ANIONGAP 15 10/17/2022    CBG (last 3)  No results for input(s): "GLUCAP" in the last 72 hours.    Coagulation Profile: No results for input(s): "INR", "PROTIME" in the last 168 hours.    Radiology Studies: I have personally reviewed the imaging studies  ECHOCARDIOGRAM COMPLETE  Result Date: 10/17/2022    ECHOCARDIOGRAM REPORT   Patient Name:   Jacob Reynolds Date of Exam: 10/17/2022 Medical Rec #:  [id number]      Height:       71.0 in Accession #:    [id number]     Weight:       148.1 lb Date of Birth:  [birth year]     BSA:          1.856 m Patient Age:    56 years       BP:           103/79 mmHg Patient Gender: M              HR:           93 bpm. Exam Location:  Inpatient Procedure: 2D Echo, Cardiac Doppler and Color Doppler Indications:    chf  History:        Patient has no prior history of Echocardiogram examinations.  Sonographer:    Mike Gip Referring Phys: [id number] ROBERT SCHERTZ IMPRESSIONS  1. Left ventricular ejection fraction, by estimation, is 45 to 50%. The LV was not well-visualized but does appear abnormal. The left ventricle has mildly decreased function. The left ventricle demonstrates global hypokinesis. Cardiac MRI may allow a better view and quantification of LV systolic function. Left ventricular diastolic parameters are consistent with Grade I diastolic dysfunction (impaired relaxation).  2. Right ventricular systolic function is normal. The right ventricular size is normal. Tricuspid  regurgitation signal is inadequate for assessing PA pressure.  3. The mitral valve is normal in structure. No evidence of mitral valve regurgitation. No evidence of mitral stenosis.  4. The aortic valve is tricuspid. Aortic valve regurgitation is not visualized. No aortic stenosis is present.  5. Aortic dilatation noted. There is mild dilatation of the aortic  root, measuring 38 mm.  6. The inferior vena cava is normal in size with greater than 50% respiratory variability, suggesting right atrial pressure of 3 mmHg. FINDINGS  Left Ventricle: Left ventricular ejection fraction, by estimation, is 45 to 50%. The left ventricle has mildly decreased function. The left ventricle demonstrates global hypokinesis. The left ventricular internal cavity size was normal in size. There is  no left ventricular hypertrophy. Left ventricular diastolic parameters are consistent with Grade I diastolic dysfunction (impaired relaxation). Right Ventricle: The right ventricular size is normal. No increase in right ventricular wall thickness. Right ventricular systolic function is normal. Tricuspid regurgitation signal is inadequate for assessing PA pressure. Left Atrium: Left atrial size was normal in size. Right Atrium: Right atrial size was normal in size. Pericardium: There is no evidence of pericardial effusion. Mitral Valve: The mitral valve is normal in structure. Mild to moderate mitral annular calcification. No evidence of mitral valve regurgitation. No evidence of mitral valve stenosis. Tricuspid Valve: The tricuspid valve is normal in structure. Tricuspid valve regurgitation is not demonstrated. Aortic Valve: The aortic valve is tricuspid. Aortic valve regurgitation is not visualized. No aortic stenosis is present. Pulmonic Valve: The pulmonic valve was normal in structure. Pulmonic valve regurgitation is not visualized. Aorta: Aortic dilatation noted. There is mild dilatation of the aortic root, measuring 38 mm. Venous: The inferior vena cava is normal in size with greater than 50% respiratory variability, suggesting right atrial pressure of 3 mmHg. IAS/Shunts: No atrial level shunt detected by color flow Doppler.  LEFT VENTRICLE PLAX 2D LVIDd:         4.20 cm      Diastology LVIDs:         2.50 cm      LV e' medial:    5.22 cm/s LV PW:         0.90 cm      LV E/e' medial:  5.8 LV  IVS:        1.00 cm      LV e' lateral:   18.10 cm/s LVOT diam:     2.20 cm      LV E/e' lateral: 1.7 LV SV:         50 LV SV Index:   27 LVOT Area:     3.80 cm  LV Volumes (MOD) LV vol d, MOD A2C: 99.4 ml LV vol d, MOD A4C: 107.0 ml LV vol s, MOD A2C: 43.6 ml LV vol s, MOD A4C: 47.5 ml LV SV MOD A2C:     55.8 ml LV SV MOD A4C:     107.0 ml LV SV MOD BP:      60.2 ml RIGHT VENTRICLE             IVC RV Basal diam:  3.10 cm     IVC diam: 1.20 cm RV S prime:     12.10 cm/s TAPSE (M-mode): 1.6 cm LEFT ATRIUM           Index        RIGHT ATRIUM           Index LA diam:  2.70 cm 1.45 cm/m   RA Area:     11.80 cm LA Vol (A4C): 24.1 ml 12.98 ml/m  RA Volume:   22.70 ml  12.23 ml/m  AORTIC VALVE LVOT Vmax:   73.30 cm/s LVOT Vmean:  49.500 cm/s LVOT VTI:    0.132 m  AORTA Ao Root diam: 3.80 cm Ao Asc diam:  3.20 cm MITRAL VALVE MV Area (PHT): 4.12 cm    SHUNTS MV Decel Time: 184 msec    Systemic VTI:  0.13 m MV E velocity: 30.10 cm/s  Systemic Diam: 2.20 cm MV A velocity: 47.40 cm/s MV E/A ratio:  0.64 Dalton McleanMD Electronically signed by Wilfred Lacy Signature Date/Time: 10/17/2022/10:30:22 AM    Final    DG Chest Port 1 View  Result Date: 10/15/2022 CLINICAL DATA:  Pleural effusion on the left. Status post left thoracentesis. EXAM: PORTABLE CHEST 1 VIEW COMPARISON:  10/13/2022 FINDINGS: Stable position of the right jugular Port-A-Cath with the tip near the superior cavoatrial junction. Again noted are diffuse interstitial lung densities, right side slightly greater than left. Slightly improved aeration at the left lung base compared to the recent comparison examination. Probable small bilateral pleural effusions or blunting at the costophrenic angles. Negative for a pneumothorax. Heart size is normal. Trachea is midline. IMPRESSION: 1. Negative for a pneumothorax following the left thoracentesis. 2. Slightly improved aeration at the left lung base compared to the recent comparison examination. 3. Cannot  exclude small bilateral pleural effusions. 4. Persistent prominent interstitial lung markings. Electronically Signed   By: Richarda Overlie M.D.   On: 10/15/2022 14:11   US THORACENTESIS ASP PLEURAL SPACE W/IMG GUIDE  Result Date: 10/15/2022 INDICATION: Patient with history of renal cell carcinoma, recurrent ascites and left pleural effusion. Request for diagnostic and therapeutic left thoracentesis. EXAM: ULTRASOUND GUIDED LEFT THORACENTESIS MEDICATIONS: 5 mL 1% lidocaine COMPLICATIONS: None immediate. PROCEDURE: An ultrasound guided thoracentesis was thoroughly discussed with the patient and questions answered. The benefits, risks, alternatives and complications were also discussed. The patient understands and wishes to proceed with the procedure. Written consent was obtained. Ultrasound was performed to localize and mark an adequate pocket of fluid in the left chest. The area was then prepped and draped in the normal sterile fashion. 1% Lidocaine was used for local anesthesia. Under ultrasound guidance a 6 Fr Safe-T-Centesis catheter was introduced. Thoracentesis was performed. The catheter was removed and a dressing applied. FINDINGS: A total of approximately 520 mL of cloudy yellow, chylous appearing fluid was removed. Samples were sent to the laboratory as requested by the clinical team. IMPRESSION: Successful ultrasound guided left thoracentesis yielding 520 mL of pleural fluid. Performed by Lynnette Caffey, PA-C Electronically Signed   By: Simonne Come M.D.   On: 10/15/2022 14:10       Alayshia Marini M.D. [facility] 10/17/2022, 12:27 PM  Available via Epic secure chat 7am-7pm After 7 pm, please refer to night coverage provider listed on amion.

## 2022-10-18 ENCOUNTER — Telehealth: Payer: Self-pay | Admitting: Pharmacist

## 2022-10-18 DIAGNOSIS — R7989 Other specified abnormal findings of blood chemistry: Secondary | ICD-10-CM | POA: Diagnosis not present

## 2022-10-18 DIAGNOSIS — E871 Hypo-osmolality and hyponatremia: Secondary | ICD-10-CM | POA: Diagnosis not present

## 2022-10-18 DIAGNOSIS — J189 Pneumonia, unspecified organism: Secondary | ICD-10-CM | POA: Diagnosis not present

## 2022-10-18 DIAGNOSIS — R0602 Shortness of breath: Secondary | ICD-10-CM | POA: Diagnosis not present

## 2022-10-18 LAB — COMPREHENSIVE METABOLIC PANEL
ALT: 26 U/L (ref 0–44)
AST: 34 U/L (ref 15–41)
Albumin: 1.7 g/dL — ABNORMAL LOW (ref 3.5–5.0)
Alkaline Phosphatase: 230 U/L — ABNORMAL HIGH (ref 38–126)
Anion gap: 9 (ref 5–15)
BUN: 20 mg/dL (ref 6–20)
CO2: 22 mmol/L (ref 22–32)
Calcium: 8.3 mg/dL — ABNORMAL LOW (ref 8.9–10.3)
Chloride: 90 mmol/L — ABNORMAL LOW (ref 98–111)
Creatinine, Ser: 0.79 mg/dL (ref 0.61–1.24)
GFR, Estimated: >60 mL/min (ref >60)
Glucose, Bld: 108 mg/dL — ABNORMAL HIGH (ref 70–99)
Potassium: 5 mmol/L (ref 3.5–5.1)
Sodium: 121 mmol/L — ABNORMAL LOW (ref 135–145)
Total Bilirubin: 0.3 mg/dL (ref 0.3–1.2)
Total Protein: 5.3 g/dL — ABNORMAL LOW (ref 6.5–8.1)

## 2022-10-18 LAB — CBC
HCT: 39.1 % (ref 39.0–52.0)
Hemoglobin: 13 g/dL (ref 13.0–17.0)
MCH: 27.7 pg (ref 26.0–34.0)
MCHC: 33.2 g/dL (ref 30.0–36.0)
MCV: 83.4 fL (ref 80.0–100.0)
Platelets: 223 10*3/uL (ref 150–400)
RBC: 4.69 MIL/uL (ref 4.22–5.81)
RDW: 25 % — ABNORMAL HIGH (ref 11.5–15.5)
WBC: 7.3 10*3/uL (ref 4.0–10.5)
nRBC: 0 % (ref 0.0–0.2)

## 2022-10-18 LAB — SODIUM, URINE, RANDOM: Sodium, Ur: <10 mmol/L

## 2022-10-18 LAB — CYTOLOGY - NON PAP

## 2022-10-18 LAB — SODIUM: Sodium: 123 mmol/L — ABNORMAL LOW (ref 135–145)

## 2022-10-18 MED ORDER — ALBUMIN HUMAN 25 % IV SOLN
25.0000 g | Freq: Four times a day (QID) | INTRAVENOUS | Status: AC
  Administered 2022-10-18 (×3): 25 g via INTRAVENOUS
  Filled 2022-10-18 (×3): qty 100

## 2022-10-18 MED ORDER — FUROSEMIDE 20 MG PO TABS
20.0000 mg | ORAL_TABLET | Freq: Every day | ORAL | Status: DC
  Administered 2022-10-18 – 2022-10-20 (×3): 20 mg via ORAL
  Filled 2022-10-18 (×3): qty 1

## 2022-10-18 MED ORDER — UREA 15 G PO PACK
15.0000 g | PACK | Freq: Two times a day (BID) | ORAL | Status: DC
  Administered 2022-10-18 – 2022-10-21 (×8): 15 g via ORAL
  Filled 2022-10-18 (×9): qty 1

## 2022-10-18 NOTE — Telephone Encounter (Signed)
Oral Chemotherapy Pharmacist Encounter  Mr. Jabs has started his belzutifan on 10/15/22 during his hospital admission using home medication supply.   Patient Education I spoke with patient for overview of new oral chemotherapy medication: Welireg (belzutifan) for the treatment of metastatic kidney cancer, planned duration until disease progression or unacceptable toxicity.   Counseled patient on administration, dosing, side effects, monitoring, drug-food interactions, safe handling, storage, and disposal. Patient will take 3 tablets (120 mg total) by mouth daily. .  Side effects include but not limited to: decrease in hgb, nausea, fatigue.    Reviewed with patient importance of keeping a medication schedule and plan for any missed doses.  After discussion with patient no patient barriers to medication adherence identified.   Mr. Blinn voiced understanding and appreciation. All questions answered. Medication handout provided.  Provided patient with Oral Chemotherapy Navigation [facility] phone number. Patient knows to call the office with questions or concerns. Oral Chemotherapy Navigation Clinic will continue to follow.  Remi Haggard, PharmD, BCPS, BCOP, CPP Hematology/Oncology Clinical Pharmacist Practitioner [place]/[facility] Oral Chemotherapy Navigation Clinic [phone]  10/18/2022 1:55 PM

## 2022-10-18 NOTE — Progress Notes (Signed)
Mobility Specialist: Progress Note   10/18/22 1035  Mobility  Activity Ambulated with assistance in hallway  Level of Assistance Contact guard assist, steadying assist  Assistive Device Front wheel walker  Distance Ambulated (ft) 284 ft  Activity Response Tolerated well  Mobility Referral Yes  $Mobility charge 1 Mobility  Mobility Specialist Start Time (ACUTE ONLY) 1010  Mobility Specialist Stop Time (ACUTE ONLY) 1034  Mobility Specialist Time Calculation (min) (ACUTE ONLY) 24 min   Pre-Mobility: 98 HR, 99% SpO2 Post-Mobility: 99 HR, 95% SpO2  Pt received in the chair and agreeable to mobility. Mod I to stand and contact guard during ambulation. No overt LOB. Pt c/o back pain, no rating given, as well as feeling lightheaded requiring x1 standing break. Pt to bed after session per request with call bell at his side.   William Laske Mobility Specialist Please contact via SecureChat or Rehab office at [phone]

## 2022-10-18 NOTE — Progress Notes (Signed)
Nephrology Follow-Up Consult note   Assessment/Recommendations: Jacob Reynolds is a/an 57 y.o. male with a past medical history significant for stage IV renal cancer admitted for hyponatremia.       Hypervolemic hyponatremia: In the setting of volume overload with low urine sodium. Asymptomatic currently. 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 Trying to optimize volume -start urea 15g BID -Start lasix 20mg  daily -Agree with paracentesis -Has plan for weekly LVPs outpatient -Consider IV albumin -Monitor sodium twice daily for now  Recurrent ascites: Status post paracentesis on 5/3.  Now with recurrence.  Consulting IR for repeat paracentesis. Diuretics as above  Metastatic cancer: Renal cell.  Management per oncology  Hypertension: Given low blood pressures we stopped amlodipine    Recommendations conveyed to primary service.    [name] [facility] 10/18/2022 9:52 AM  ___________________________________________________________  CC: ascites, hyponatremia  Interval History/Subjective: Patient having some pain today.  Still with distended abdomen.  Denies significant nausea or vomiting.   Medications:  Current Facility-Administered Medications  Medication Dose Route Frequency Provider Last Rate Last Admin   acetaminophen (TYLENOL) tablet 650 mg  650 mg Oral Q6H PRN Madelyn Flavors A, MD   650 mg at 10/18/22 1610   Or   acetaminophen (TYLENOL) suppository 650 mg  650 mg Rectal Q6H PRN Madelyn Flavors A, MD       albumin human 25 % solution 25 g  25 g Intravenous Q6H Rai, Ripudeep K, MD 60 mL/hr at 10/18/22 0843 12.5 g at 10/18/22 0843   albuterol (PROVENTIL) (2.5 MG/3ML) 0.083% nebulizer solution 2.5 mg  2.5 mg Nebulization Q2H PRN Clydie Braun, MD       belzutifan [facility]) tablet 120 mg  120 mg Oral Daily Malachy Mood, MD   120 mg at 10/17/22 1355   cefTRIAXone (ROCEPHIN) 2 g in sodium chloride 0.9 % 100  mL IVPB  2 g Intravenous Q24H Smith, Rondell A, MD 200 mL/hr at 10/17/22 2131 2 g at 10/17/22 2131   Chlorhexidine Gluconate Cloth 2 % PADS 6 each  6 each Topical Daily Madelyn Flavors A, MD   6 each at 10/17/22 1000   enoxaparin (LOVENOX) injection 40 mg  40 mg Subcutaneous Q24H Smith, Rondell A, MD   40 mg at 10/18/22 0844   EPINEPHrine (EPI-PEN) injection 0.3 mg  0.3 mg Intramuscular Once PRN Tilden Fossa, MD       feeding supplement (ENSURE ENLIVE / ENSURE PLUS) liquid 237 mL  237 mL Oral BID BM Opyd, Lavone Neri, MD   237 mL at 10/17/22 1154   fluticasone furoate-vilanterol (BREO ELLIPTA) 200-25 MCG/ACT 1 puff  1 puff Inhalation Daily Rai, Ripudeep K, MD   1 puff at 10/18/22 0740   furosemide (LASIX) tablet 20 mg  20 mg Oral Daily [name], MD   20 mg at 10/18/22 0845   HYDROmorphone (DILAUDID) tablet 2 mg  2 mg Oral Q6H PRN Madelyn Flavors A, MD   2 mg at 10/17/22 2216   lidocaine (LIDODERM) 5 % 2 patch  2 patch Transdermal Q24H Madelyn Flavors A, MD   2 patch at 10/17/22 1154   loperamide (IMODIUM) capsule 2 mg  2 mg Oral PRN Madelyn Flavors A, MD       magnesium oxide (MAG-OX) tablet 400 mg  400 mg Oral Daily Rai, Ripudeep K, MD   400 mg at 10/18/22 0845   rosuvastatin (CRESTOR) tablet 5 mg  5  mg Oral Daily Madelyn Flavors A, MD   5 mg at 10/18/22 0845   sodium chloride flush (NS) 0.9 % injection 10-40 mL  10-40 mL Intracatheter Q12H Smith, Rondell A, MD   10 mL at 10/17/22 2131   sodium chloride flush (NS) 0.9 % injection 10-40 mL  10-40 mL Intracatheter PRN Madelyn Flavors A, MD       sodium chloride flush (NS) 0.9 % injection 3 mL  3 mL Intravenous Q12H Smith, Rondell A, MD   3 mL at 10/17/22 2131   trimethobenzamide (TIGAN) injection 200 mg  200 mg Intramuscular Q6H PRN Madelyn Flavors A, MD       umeclidinium bromide (INCRUSE ELLIPTA) 62.5 MCG/ACT 1 puff  1 puff Inhalation Daily Rai, Ripudeep K, MD   1 puff at 10/18/22 0740   urea (URE-NA) oral packet 15 g  15 g Oral BID [name], MD       Facility-Administered Medications Ordered in Other Encounters  Medication Dose Route Frequency Provider Last Rate Last Admin   diphenhydrAMINE (BENADRYL) 50 MG/ML injection            famotidine (PEPCID) 20-0.9 MG/50ML-% IVPB               Review of Systems: 10 systems reviewed and negative except per interval history/subjective  Physical Exam: Vitals:   10/18/22 0741 10/18/22 0802  BP:  118/82  Pulse:  95  Resp:  20  Temp:    SpO2: 94% 92%   Total I/O In: 240 [P.O.:240] Out: -   Intake/Output Summary (Last 24 hours) at 10/18/2022 1610 Last data filed at 10/18/2022 0810 Gross per 24 hour  Intake 240 ml  Output --  Net 240 ml   Constitutional: chronically ill appearing ENMT: ears and nose without scars or lesions, MMM CV: normal rate, no edema Respiratory: bilateral chest rise, normal work of breathing Gastrointestinal: distended, nontender Skin: no visible lesions or rashes Psych: alert, judgement/insight appropriate, appropriate mood and affect   Test Results I personally reviewed new and old clinical labs and radiology tests Lab Results  Component Value Date   NA 121 (L) 10/18/2022   K 5.0 10/18/2022   CL 90 (L) 10/18/2022   CO2 22 10/18/2022   BUN 20 10/18/2022   CREATININE 0.79 10/18/2022   CALCIUM 8.3 (L) 10/18/2022   ALBUMIN 1.7 (L) 10/18/2022   PHOS 3.3 10/13/2022    CBC Recent Labs  Lab 10/13/22 0310 10/13/22 0316 10/16/22 0428 10/17/22 0340 10/18/22 0400  WBC 7.4   < > 6.5 7.2 7.3  NEUTROABS 6.1  --   --   --   --   HGB 14.6   < > 13.6 13.1 13.0  HCT 42.5   < > 39.4 39.5 39.1  MCV 80.8   < > 81.2 83.3 83.4  PLT 190   < > 206 222 223   < > = values in this interval not displayed.

## 2022-10-18 NOTE — Progress Notes (Signed)
[facility]                                                                              Rishaan Rodkey, is a 57 y.o. male, DOB - 08-02-1965, NFA:[id number] Admit date - 10/13/2022    Outpatient Primary MD for the patient is [name], FNP  LOS - 4  days  Chief Complaint  Patient presents with   Shortness of Breath       Brief summary   Patient is a 57 year old male with HTN, stage IV renal cancer with metastasis to bone presented with feeling of " throat closing up".  He questioned if it was an allergic reaction as he was on several medications. He had not darted belzutifan 120 mg daily he had as he was supposed to start today. Patient just recently hospitalized 4/27-4/29 with intractable nausea, vomiting, and abdominal Per patient, since being home he had increased abdominal swelling, dry mouth.  His throat symptoms improved in the ER however still felt some discomfort.  He previously had thrush.  Also reported nausea, back pain, fatigue, shortness of breath, substernal chest discomfort during the episode. En route with EMS, patient was given epinephrine nebulizer treatment with improvement in his symptoms  In ED, noted to be tachycardia, tachypnea, O2 sats on room air. Sodium 122, CO2 19, BUN 22, creatinine 1.23 X-rays of the neck negative.  Chest x-ray showed diffuse interstitial groundglass opacities, mildly increased, representing edema superimposed on interstitial changes.  VQ scan negative for PE Patient was admitted for further workup.  Significant events 5/3: Underwent paracentesis, 3.9 L removed.  Oncology consulted 5/4: Underwent thoracentesis, left, 520 cc removed.  Started on chemotherapy, Belzutifan 5/5: Sodium trending down, nephrology consulted  Assessment & Plan    Principal Problem:   SOB (shortness of breath), ?  Throat closing, probable CAP pneumonia -Presented with acute feeling of throat closing and shortness of breath.   -Chest x-ray  noted diffuse interstitial and groundglass opacities  -VQ scan negative for PE. -CT soft tissue neck showed no acute findings, left supraclavicular lymph nodes increased in size, left pleural effusion decreased. -Continue doxycycline and IV Rocephin.  Procalcitonin 0.63 -s/p left sided thoracentesis on 5/4.  520 cc of cloudy yellow chylous appearing fluid removed.  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 Ellin Saba oncology, recently completed XRT. Recent CT scan confirmed progression of disease with left pleural effusion, ascites, and concerns for lymphangitic spread. He was supposed  to start  on belzutifan on the day of admission - Underwent paracentesis 5/3, 3.9 L removed -Underwent left-sided thoracentesis on 5/4, 520 cc removed  -Appreciate oncology evaluation, started Belzutifan per recommendations -d/w Oncology, Dr Mosetta Putt, PleurX in abdomen is generally not recommended by IR due to risk of infection. D/w Dr Ellin Saba on 5/6, will arrange outpatient paracentesis at [facility] weekly. -Repeat paracentesis still pending, hopefully today   Generalized debility -Lives alone and reports having to difficulty getting around. -PT OT evaluation   Prolonged QT interval - QTc was noted to  be 690. -Avoid QT prolonging meds, follow K, Mag   Hypocalcemia -Patient received 2 g IV calcium gluconate in ED   Essential hypertension -Continue amlodipine   Hyperlipidemia -Continue Crestor   Severe protein calorie malnutrition Albumin noted to be significantly low at 1.9 Estimated body mass index is 20.66 kg/m as calculated from the following:   Height as of this encounter: 5\' 11"  (1.803 m).   Weight as of this encounter: 67.2 kg.  Code Status: Full code DVT Prophylaxis:  enoxaparin (LOVENOX) injection 40 mg Start: 10/13/22 0815   Level of Care: Level of care: Progressive Family Communication: Updated patient Disposition Plan:      Remains inpatient appropriate:     Procedures:  Ultrasound-guided paracentesis 5/3 Ultrasound-guided thoracentesis 5/4  Consultants:   Oncology, Dr. Mosetta Putt Nephrology Nephrology  Antimicrobials:   Anti-infectives (From admission, onward)    Start     Dose/Rate Route Frequency Ordered Stop   10/14/22 2200  doxycycline (VIBRA-TABS) tablet 100 mg        100 mg Oral Every 12 hours 10/14/22 1140 10/18/22 2159   10/13/22 2200  doxycycline (VIBRAMYCIN) 100 mg in sodium chloride 0.9 % 250 mL IVPB  Status:  Discontinued        100 mg 125 mL/hr over 120 Minutes Intravenous Every 12 hours 10/13/22 2050 10/14/22 1140   10/13/22 2100  cefTRIAXone (ROCEPHIN) 2 g in sodium chloride 0.9 % 100 mL IVPB        2 g 200 mL/hr over 30 Minutes Intravenous Every 24 hours 10/13/22 2048            Medications  belzutifan  120 mg Oral Daily   Chlorhexidine Gluconate Cloth  6 each Topical Daily   doxycycline  100 mg Oral Q12H   enoxaparin (LOVENOX) injection  40 mg Subcutaneous Q24H   feeding supplement  237 mL Oral BID BM   fluticasone furoate-vilanterol  1 puff Inhalation Daily   lidocaine  2 patch Transdermal Q24H   magnesium oxide  400 mg Oral Daily   rosuvastatin  5 mg Oral Daily   sodium chloride flush  10-40 mL Intracatheter Q12H   sodium  chloride flush  3 mL Intravenous Q12H   umeclidinium bromide  1 puff Inhalation Daily      Subjective:   Sarah Purk was seen and examined today.  Sitting Up in the chair, eating breakfast.  Having abdominal discomfort and distention.  Awaiting paracentesis.  No fevers or chills, cough, chest pain.     Objective:   Vitals:   10/16/22 2340 10/17/22 0331 10/17/22 0750 10/17/22 1152  BP: 108/85 114/84  103/81  Pulse: 91 87  92  Resp: 20 19  20   Temp: 97.8 F (36.6 C) 97.8 F (36.6 C) 97.8 F (36.6 C) 97.6 F (36.4 C)  TempSrc: Oral Oral Oral Oral  SpO2: 91% 92%  91%  Weight:      Height:  Intake/Output Summary (Last 24 hours) at 10/17/2022 1227 Last data filed at 10/17/2022 0981 Gross per 24 hour  Intake 772.06 ml  Output 100 ml  Net 672.06 ml     Wt Readings from Last 3 Encounters:  10/13/22 67.2 kg  10/11/22 66.3 kg  10/09/22 72.7 kg   Physical Exam General: A x O x3, NAD, ill-appearing, cachectic Cardiovascular: S1 S2 clear, RRR.  Respiratory: Diminished breath sounds at the bases Gastrointestinal: Diffuse TTP, distended with ascites Ext: + pedal edema bilaterally Neuro: no new deficits Psych: Normal affect   Data Reviewed:  I have personally reviewed following labs    CBC Lab Results  Component Value Date   WBC 7.2 10/17/2022   RBC 4.74 10/17/2022   HGB 13.1 10/17/2022   HCT 39.5 10/17/2022   MCV 83.3 10/17/2022   MCH 27.6 10/17/2022   PLT 222 10/17/2022   MCHC 33.2 10/17/2022   RDW 24.8 (H) 10/17/2022   LYMPHSABS 0.6 (L) 10/13/2022   MONOABS 0.5 10/13/2022   EOSABS 0.1 10/13/2022   BASOSABS 0.1 10/13/2022     Last metabolic panel Lab Results  Component Value Date   NA 125 (L) 10/17/2022   K 4.9 10/17/2022   CL 89 (L) 10/17/2022   CO2 21 (L) 10/17/2022   BUN 23 (H) 10/17/2022   CREATININE 0.90 10/17/2022   GLUCOSE 105 (H) 10/17/2022   GFRNONAA >60 10/17/2022   GFRAA 119 09/05/2015   CALCIUM 8.4 (L) 10/17/2022   PHOS 3.3  10/13/2022   PROT 5.5 (L) 10/17/2022   ALBUMIN 1.9 (L) 10/17/2022   LABGLOB 3.0 08/05/2021   AGRATIO 1.5 08/05/2021   BILITOT 0.3 10/17/2022   ALKPHOS 176 (H) 10/17/2022   AST 26 10/17/2022   ALT 21 10/17/2022   ANIONGAP 15 10/17/2022    CBG (last 3)  No results for input(s): "GLUCAP" in the last 72 hours.    Coagulation Profile: No results for input(s): "INR", "PROTIME" in the last 168 hours.    Radiology Studies: I have personally reviewed the imaging studies  ECHOCARDIOGRAM COMPLETE  Result Date: 10/17/2022    ECHOCARDIOGRAM REPORT   Patient Name:   KENJI PAUSTIAN Date of Exam: 10/17/2022 Medical Rec #:  [id number]      Height:       71.0 in Accession #:    [id number]     Weight:       148.1 lb Date of Birth:  1966-03-25     BSA:          1.856 m Patient Age:    56 years       BP:           103/79 mmHg Patient Gender: M              HR:           93 bpm. Exam Location:  Inpatient Procedure: 2D Echo, Cardiac Doppler and Color Doppler Indications:    chf  History:        Patient has no prior history of Echocardiogram examinations.  Sonographer:    Mike Gip Referring Phys: [id number] ROBERT SCHERTZ IMPRESSIONS  1. Left ventricular ejection fraction, by estimation, is 45 to 50%. The LV was not well-visualized but does appear abnormal. The left ventricle has mildly decreased function. The left ventricle demonstrates global hypokinesis. Cardiac MRI may allow a better view and quantification of LV systolic function. Left ventricular diastolic parameters are consistent with Grade I diastolic dysfunction (impaired relaxation).  2. Right  ventricular systolic function is normal. The right ventricular size is normal. Tricuspid regurgitation signal is inadequate for assessing PA pressure.  3. The mitral valve is normal in structure. No evidence of mitral valve regurgitation. No evidence of mitral stenosis.  4. The aortic valve is tricuspid. Aortic valve regurgitation is not visualized. No aortic stenosis  is present.  5. Aortic dilatation noted. There is mild dilatation of the aortic root, measuring 38 mm.  6. The inferior vena cava is normal in size with greater than 50% respiratory variability, suggesting right atrial pressure of 3 mmHg. FINDINGS  Left Ventricle: Left ventricular ejection fraction, by estimation, is 45 to 50%. The left ventricle has mildly decreased function. The left ventricle demonstrates global hypokinesis. The left ventricular internal cavity size was normal in size. There is  no left ventricular hypertrophy. Left ventricular diastolic parameters are consistent with Grade I diastolic dysfunction (impaired relaxation). Right Ventricle: The right ventricular size is normal. No increase in right ventricular wall thickness. Right ventricular systolic function is normal. Tricuspid regurgitation signal is inadequate for assessing PA pressure. Left Atrium: Left atrial size was normal in size. Right Atrium: Right atrial size was normal in size. Pericardium: There is no evidence of pericardial effusion. Mitral Valve: The mitral valve is normal in structure. Mild to moderate mitral annular calcification. No evidence of mitral valve regurgitation. No evidence of mitral valve stenosis. Tricuspid Valve: The tricuspid valve is normal in structure. Tricuspid valve regurgitation is not demonstrated. Aortic Valve: The aortic valve is tricuspid. Aortic valve regurgitation is not visualized. No aortic stenosis is present. Pulmonic Valve: The pulmonic valve was normal in structure. Pulmonic valve regurgitation is not visualized. Aorta: Aortic dilatation noted. There is mild dilatation of the aortic root, measuring 38 mm. Venous: The inferior vena cava is normal in size with greater than 50% respiratory variability, suggesting right atrial pressure of 3 mmHg. IAS/Shunts: No atrial level shunt detected by color flow Doppler.  LEFT VENTRICLE PLAX 2D LVIDd:         4.20 cm      Diastology LVIDs:         2.50 cm       LV e' medial:    5.22 cm/s LV PW:         0.90 cm      LV E/e' medial:  5.8 LV IVS:        1.00 cm      LV e' lateral:   18.10 cm/s LVOT diam:     2.20 cm      LV E/e' lateral: 1.7 LV SV:         50 LV SV Index:   27 LVOT Area:     3.80 cm  LV Volumes (MOD) LV vol d, MOD A2C: 99.4 ml LV vol d, MOD A4C: 107.0 ml LV vol s, MOD A2C: 43.6 ml LV vol s, MOD A4C: 47.5 ml LV SV MOD A2C:     55.8 ml LV SV MOD A4C:     107.0 ml LV SV MOD BP:      60.2 ml RIGHT VENTRICLE             IVC RV Basal diam:  3.10 cm     IVC diam: 1.20 cm RV S prime:     12.10 cm/s TAPSE (M-mode): 1.6 cm LEFT ATRIUM           Index        RIGHT ATRIUM  Index LA diam:      2.70 cm 1.45 cm/m   RA Area:     11.80 cm LA Vol (A4C): 24.1 ml 12.98 ml/m  RA Volume:   22.70 ml  12.23 ml/m  AORTIC VALVE LVOT Vmax:   73.30 cm/s LVOT Vmean:  49.500 cm/s LVOT VTI:    0.132 m  AORTA Ao Root diam: 3.80 cm Ao Asc diam:  3.20 cm MITRAL VALVE MV Area (PHT): 4.12 cm    SHUNTS MV Decel Time: 184 msec    Systemic VTI:  0.13 m MV E velocity: 30.10 cm/s  Systemic Diam: 2.20 cm MV A velocity: 47.40 cm/s MV E/A ratio:  0.64 Dalton McleanMD Electronically signed by Wilfred Lacy Signature Date/Time: 10/17/2022/10:30:22 AM    Final    DG Chest Port 1 View  Result Date: 10/15/2022 CLINICAL DATA:  Pleural effusion on the left. Status post left thoracentesis. EXAM: PORTABLE CHEST 1 VIEW COMPARISON:  10/13/2022 FINDINGS: Stable position of the right jugular Port-A-Cath with the tip near the superior cavoatrial junction. Again noted are diffuse interstitial lung densities, right side slightly greater than left. Slightly improved aeration at the left lung base compared to the recent comparison examination. Probable small bilateral pleural effusions or blunting at the costophrenic angles. Negative for a pneumothorax. Heart size is normal. Trachea is midline. IMPRESSION: 1. Negative for a pneumothorax following the left thoracentesis. 2. Slightly improved aeration at  the left lung base compared to the recent comparison examination. 3. Cannot exclude small bilateral pleural effusions. 4. Persistent prominent interstitial lung markings. Electronically Signed   By: Richarda Overlie M.D.   On: 10/15/2022 14:11   US THORACENTESIS ASP PLEURAL SPACE W/IMG GUIDE  Result Date: 10/15/2022 INDICATION: Patient with history of renal cell carcinoma, recurrent ascites and left pleural effusion. Request for diagnostic and therapeutic left thoracentesis. EXAM: ULTRASOUND GUIDED LEFT THORACENTESIS MEDICATIONS: 5 mL 1% lidocaine COMPLICATIONS: None immediate. PROCEDURE: An ultrasound guided thoracentesis was thoroughly discussed with the patient and questions answered. The benefits, risks, alternatives and complications were also discussed. The patient understands and wishes to proceed with the procedure. Written consent was obtained. Ultrasound was performed to localize and mark an adequate pocket of fluid in the left chest. The area was then prepped and draped in the normal sterile fashion. 1% Lidocaine was used for local anesthesia. Under ultrasound guidance a 6 Fr Safe-T-Centesis catheter was introduced. Thoracentesis was performed. The catheter was removed and a dressing applied. FINDINGS: A total of approximately 520 mL of cloudy yellow, chylous appearing fluid was removed. Samples were sent to the laboratory as requested by the clinical team. IMPRESSION: Successful ultrasound guided left thoracentesis yielding 520 mL of pleural fluid. Performed by Lynnette Caffey, PA-C Electronically Signed   By: Simonne Come M.D.   On: 10/15/2022 14:10       TRUE Garciamartinez M.D. [facility] 10/17/2022, 12:27 PM  Available via Epic secure chat 7am-7pm After 7 pm, please refer to night coverage provider listed on amion.

## 2022-10-19 DIAGNOSIS — E871 Hypo-osmolality and hyponatremia: Secondary | ICD-10-CM | POA: Diagnosis not present

## 2022-10-19 DIAGNOSIS — J189 Pneumonia, unspecified organism: Secondary | ICD-10-CM | POA: Diagnosis not present

## 2022-10-19 DIAGNOSIS — C7951 Secondary malignant neoplasm of bone: Secondary | ICD-10-CM | POA: Diagnosis not present

## 2022-10-19 DIAGNOSIS — R0602 Shortness of breath: Secondary | ICD-10-CM | POA: Diagnosis not present

## 2022-10-19 LAB — COMPREHENSIVE METABOLIC PANEL
ALT: 26 U/L (ref 0–44)
AST: 35 U/L (ref 15–41)
Albumin: 2.7 g/dL — ABNORMAL LOW (ref 3.5–5.0)
Alkaline Phosphatase: 232 U/L — ABNORMAL HIGH (ref 38–126)
Anion gap: 12 (ref 5–15)
BUN: 30 mg/dL — ABNORMAL HIGH (ref 6–20)
CO2: 24 mmol/L (ref 22–32)
Calcium: 8.5 mg/dL — ABNORMAL LOW (ref 8.9–10.3)
Chloride: 86 mmol/L — ABNORMAL LOW (ref 98–111)
Creatinine, Ser: 0.73 mg/dL (ref 0.61–1.24)
GFR, Estimated: >60 mL/min (ref >60)
Glucose, Bld: 97 mg/dL (ref 70–99)
Potassium: 4.3 mmol/L (ref 3.5–5.1)
Sodium: 122 mmol/L — ABNORMAL LOW (ref 135–145)
Total Bilirubin: 0.5 mg/dL (ref 0.3–1.2)
Total Protein: 5.7 g/dL — ABNORMAL LOW (ref 6.5–8.1)

## 2022-10-19 LAB — CBC
HCT: 35.1 % — ABNORMAL LOW (ref 39.0–52.0)
Hemoglobin: 12 g/dL — ABNORMAL LOW (ref 13.0–17.0)
MCH: 28.1 pg (ref 26.0–34.0)
MCHC: 34.2 g/dL (ref 30.0–36.0)
MCV: 82.2 fL (ref 80.0–100.0)
Platelets: 203 10*3/uL (ref 150–400)
RBC: 4.27 MIL/uL (ref 4.22–5.81)
RDW: 24.5 % — ABNORMAL HIGH (ref 11.5–15.5)
WBC: 7.4 10*3/uL (ref 4.0–10.5)
nRBC: 0 % (ref 0.0–0.2)

## 2022-10-19 LAB — MAGNESIUM: Magnesium: 2.1 mg/dL (ref 1.7–2.4)

## 2022-10-19 MED ORDER — ALBUMIN HUMAN 25 % IV SOLN
12.5000 g | Freq: Four times a day (QID) | INTRAVENOUS | Status: DC
  Administered 2022-10-19 – 2022-10-20 (×3): 12.5 g via INTRAVENOUS
  Filled 2022-10-19 (×3): qty 50

## 2022-10-19 NOTE — Progress Notes (Addendum)
Physical Therapy Treatment and Discharge Patient Details Name: Jacob Reynolds MRN: [id number] DOB: [birth year] Today's Date: 10/19/2022   History of Present Illness Pt is 57 year old presented to [facility] on  10/13/22 for SOB and feeling of throat closing. Possible PNA.  Parencentesis of 6.1L on admission. PMH - stage IV renal cell CA with bone mets and malignant ascites, HTN    PT Comments    Pt sitting up in recliner agreeable to try stairs with PT. Pt reports increase WoB today with mobility. Pt is mod I for all mobility.  Pt does experience 4/4 DoE with 3x ascent/descent of 3 stairs, however recovers with 2 min and is able to walk back to room. Verbally reviewed HEP. Pt has met his PT goals and will discharge from Acute PT. Encouraged ambulation with Mobility Specialist.   Recommendations for follow up therapy are one component of a multi-disciplinary discharge planning process, led by the attending physician.  Recommendations may be updated based on patient status, additional functional criteria and insurance authorization.     Assistance Recommended at Discharge PRN  Patient can return home with the following Assist for transportation   Equipment Recommendations  None recommended by PT       Precautions / Restrictions Precautions Precautions: None Restrictions Weight Bearing Restrictions: No     Mobility  Bed Mobility Overal bed mobility: Modified Independent                  Transfers Overall transfer level: Modified independent Equipment used: None                    Ambulation/Gait Ambulation/Gait assistance: Modified independent (Device/Increase time)   Assistive device: None Gait Pattern/deviations: Step-through pattern, Decreased stride length, Wide base of support Gait velocity: decr Gait velocity interpretation: <1.8 ft/sec, indicate of risk for recurrent falls   General Gait Details: Steady gait, 3/4 DoE by end of session   Stairs Stairs:  Yes Stairs assistance: Modified independent (Device/Increase time) Stair Management: Two rails, Backwards, Forwards Number of Stairs: 3 (x3) General stair comments: strong step up and back down 3 steps, x3         Balance Overall balance assessment: Mild deficits observed, not formally tested                                          Cognition Arousal/Alertness: Awake/alert Behavior During Therapy: WFL for tasks assessed/performed Overall Cognitive Status: Within Functional Limits for tasks assessed                                             General Comments General comments (skin integrity, edema, etc.): at rest HR 102bpm, with ambulation and stairs 115bpm, 4/4 DoE with stairs recovers within 2 min and is able to walk back to room      Pertinent Vitals/Pain Pain Assessment Pain Assessment: Faces Faces Pain Scale: Hurts little more Pain Location: L LE and groin Pain Descriptors / Indicators: Grimacing Pain Intervention(s): Limited activity within patient's tolerance, Monitored during session, Repositioned     PT Goals (current goals can now be found in the care plan section) Acute Rehab PT Goals Patient Stated Goal: get stronger PT Goal Formulation: With patient Time For Goal Achievement: 10/21/22 Potential to  Achieve Goals: Good Progress towards PT goals: Progressing toward goals    Frequency    Min 3X/week      PT Plan Current plan remains appropriate       AM-PAC PT "6 Clicks" Mobility   Outcome Measure  Help needed turning from your back to your side while in a flat bed without using bedrails?: None Help needed moving from lying on your back to sitting on the side of a flat bed without using bedrails?: None Help needed moving to and from a bed to a chair (including a wheelchair)?: None Help needed standing up from a chair using your arms (e.g., wheelchair or bedside chair)?: None Help needed to walk in hospital  room?: None Help needed climbing 3-5 steps with a railing? : A Little 6 Click Score: 23    End of Session Equipment Utilized During Treatment: Gait belt Activity Tolerance: Patient tolerated treatment well Patient left: with call bell/phone within reach;in bed Nurse Communication: Mobility status PT Visit Diagnosis: Muscle weakness (generalized) (M62.81)     Time: 4098-1191 PT Time Calculation (min) (ACUTE ONLY): 24 min  Charges:  $Gait Training: 8-22 mins $Therapeutic Exercise: 8-22 mins                     Harleen Fineberg B. Beverely Risen PT, DPT Acute Rehabilitation Services Please use secure chat or  Call Office [phone]    Elon Alas [facility] 10/19/2022, 12:01 PM

## 2022-10-19 NOTE — Consult Note (Signed)
   [facility] Inpatient Consult   10/19/2022  Jacob Reynolds August 04, 1965 [id number]  [facility] [[facility]]  Accountable Care Organization [ACO] Patient:  [employer]  Primary Care Provider:  [name], FNP with [facility] which is listed to provide the transition of care follow up   Patient screened for less than 7 days readmission hospitalization with noted high risk score for unplanned readmission risk length of stay and to assess for potential [facility]  [[facility]] Care Management service needs for post hospital transition for care coordination.  Review of patient's electronic medical record reveals patient is followed closely with the oncology team noted.  Patient was resting on rounds. Reviewed regarding needs and spoke with RN about paracentesis and patient is still awaiting potential procedure.   Plan:  Continue to follow progress and disposition to assess for post hospital community care coordination/management needs.  Referral request for community care coordination: following for disposition needs for any community care coordination needs.  Of note, [facility] Care Management/Population Health does not replace or interfere with any arrangements made by the Inpatient Transition of Care team.  For questions contact:   Charlesetta Shanks, RN BSN CCM [facility]  [phone] business mobile phone Toll free office [phone]  *Concierge Line  [phone] Fax number: [phone] [email or web address]

## 2022-10-19 NOTE — Progress Notes (Signed)
Nephrology Follow-Up Consult note   Assessment/Recommendations: Jacob Reynolds is a/an 57 y.o. male with a past medical history significant for stage IV renal cancer admitted for hyponatremia.       Hypervolemic hyponatremia: In the setting of volume overload with low urine sodium. Asymptomatic currently. 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 Trying to optimize volume -Continue urea 15 g twice daily and Lasix 20 mg daily -Agree with paracentesis; awaiting this to occur -Has plan for weekly LVPs outpatient -Consider IV albumin -Continue to monitor sodium regularly  Recurrent ascites: Status post paracentesis on 5/3.  Now with recurrence.  Recommend paracentesis.  Diuretics as above  Metastatic cancer: Renal cell.  Management per oncology  Hypertension: Given low blood pressures we stopped amlodipine    Recommendations conveyed to primary service.    [name] [facility] 10/19/2022 9:49 AM  ___________________________________________________________  CC: ascites, hyponatremia  Interval History/Subjective: Patient continues to have pain in his groin.  Feels like his abdomen is distended.  Continues to await LVP   Medications:  Current Facility-Administered Medications  Medication Dose Route Frequency Provider Last Rate Last Admin   acetaminophen (TYLENOL) tablet 650 mg  650 mg Oral Q6H PRN Madelyn Flavors A, MD   650 mg at 10/19/22 5784   Or   acetaminophen (TYLENOL) suppository 650 mg  650 mg Rectal Q6H PRN Madelyn Flavors A, MD       albuterol (PROVENTIL) (2.5 MG/3ML) 0.083% nebulizer solution 2.5 mg  2.5 mg Nebulization Q2H PRN Clydie Braun, MD       belzutifan [facility]) tablet 120 mg  120 mg Oral Daily Malachy Mood, MD   120 mg at 10/18/22 1513   cefTRIAXone (ROCEPHIN) 2 g in sodium chloride 0.9 % 100 mL IVPB  2 g Intravenous Q24H Smith, Rondell A, MD 200 mL/hr at 10/18/22 2031 2 g at 10/18/22 2031    Chlorhexidine Gluconate Cloth 2 % PADS 6 each  6 each Topical Daily Madelyn Flavors A, MD   6 each at 10/18/22 0810   enoxaparin (LOVENOX) injection 40 mg  40 mg Subcutaneous Q24H Smith, Rondell A, MD   40 mg at 10/19/22 0837   EPINEPHrine (EPI-PEN) injection 0.3 mg  0.3 mg Intramuscular Once PRN Tilden Fossa, MD       feeding supplement (ENSURE ENLIVE / ENSURE PLUS) liquid 237 mL  237 mL Oral BID BM Opyd, Lavone Neri, MD   237 mL at 10/17/22 1154   fluticasone furoate-vilanterol (BREO ELLIPTA) 200-25 MCG/ACT 1 puff  1 puff Inhalation Daily Rai, Ripudeep K, MD   1 puff at 10/19/22 0838   furosemide (LASIX) tablet 20 mg  20 mg Oral Daily [name], MD   20 mg at 10/18/22 0845   HYDROmorphone (DILAUDID) tablet 2 mg  2 mg Oral Q6H PRN Madelyn Flavors A, MD   2 mg at 10/18/22 2026   lidocaine (LIDODERM) 5 % 2 patch  2 patch Transdermal Q24H Madelyn Flavors A, MD   2 patch at 10/18/22 1035   loperamide (IMODIUM) capsule 2 mg  2 mg Oral PRN Madelyn Flavors A, MD       magnesium oxide (MAG-OX) tablet 400 mg  400 mg Oral Daily Rai, Ripudeep K, MD   400 mg at 10/18/22 0845   rosuvastatin (CRESTOR) tablet 5 mg  5 mg Oral Daily Katrinka Blazing, Rondell A, MD   5 mg at 10/18/22 0845   sodium chloride flush (NS)  0.9 % injection 10-40 mL  10-40 mL Intracatheter Q12H Smith, Rondell A, MD   10 mL at 10/18/22 2151   sodium chloride flush (NS) 0.9 % injection 10-40 mL  10-40 mL Intracatheter PRN Madelyn Flavors A, MD       sodium chloride flush (NS) 0.9 % injection 3 mL  3 mL Intravenous Q12H Smith, Rondell A, MD   3 mL at 10/17/22 2131   trimethobenzamide (TIGAN) injection 200 mg  200 mg Intramuscular Q6H PRN Smith, Rondell A, MD       umeclidinium bromide (INCRUSE ELLIPTA) 62.5 MCG/ACT 1 puff  1 puff Inhalation Daily Rai, Ripudeep K, MD   1 puff at 10/19/22 0838   urea (URE-NA) oral packet 15 g  15 g Oral BID [name], MD   15 g at 10/18/22 2140   Facility-Administered Medications Ordered in Other Encounters   Medication Dose Route Frequency Provider Last Rate Last Admin   diphenhydrAMINE (BENADRYL) 50 MG/ML injection            famotidine (PEPCID) 20-0.9 MG/50ML-% IVPB               Review of Systems: 10 systems reviewed and negative except per interval history/subjective  Physical Exam: Vitals:   10/19/22 0710 10/19/22 0742  BP: 99/80 104/74  Pulse: (!) 101 95  Resp: (!) 30 19  Temp: 97.7 F (36.5 C)   SpO2: 91% 92%   No intake/output data recorded.  Intake/Output Summary (Last 24 hours) at 10/19/2022 0949 Last data filed at 10/19/2022 0400 Gross per 24 hour  Intake 981.23 ml  Output 500 ml  Net 481.23 ml   Constitutional: chronically ill appearing ENMT: ears and nose without scars or lesions, MMM CV: normal rate, no edema Respiratory: bilateral chest rise, normal work of breathing Gastrointestinal: distended, nontender Skin: no visible lesions or rashes Psych: alert, judgement/insight appropriate, appropriate mood and affect   Test Results I personally reviewed new and old clinical labs and radiology tests Lab Results  Component Value Date   NA 122 (L) 10/19/2022   K 4.3 10/19/2022   CL 86 (L) 10/19/2022   CO2 24 10/19/2022   BUN 30 (H) 10/19/2022   CREATININE 0.73 10/19/2022   CALCIUM 8.5 (L) 10/19/2022   ALBUMIN 2.7 (L) 10/19/2022   PHOS 3.3 10/13/2022    CBC Recent Labs  Lab 10/13/22 0310 10/13/22 0316 10/17/22 0340 10/18/22 0400 10/19/22 0330  WBC 7.4   < > 7.2 7.3 7.4  NEUTROABS 6.1  --   --   --   --   HGB 14.6   < > 13.1 13.0 12.0*  HCT 42.5   < > 39.5 39.1 35.1*  MCV 80.8   < > 83.3 83.4 82.2  PLT 190   < > 222 223 203   < > = values in this interval not displayed.

## 2022-10-19 NOTE — Progress Notes (Signed)
PROGRESS NOTE    Jacob Reynolds  WJX:[id number] DOB: 1965-06-24 DOA: 10/13/2022 PCP: [name], FNP    Chief Complaint  Patient presents with   Shortness of Breath    Brief Narrative:  Patient is a 57 year old male with HTN, stage IV renal cancer with metastasis to bone presented with feeling of " throat closing up".  He questioned if it was an allergic reaction as he was on several medications. He had not darted belzutifan 120 mg daily he had as he was supposed to start today. Patient just recently hospitalized 4/27-4/29 with intractable nausea, vomiting, and abdominal Per patient, since being home he had increased abdominal swelling, dry mouth.  His throat symptoms improved in the ER however still felt some discomfort.  He previously had thrush.  Also reported nausea, back pain, fatigue, shortness of breath, substernal chest discomfort during the episode. En route with EMS, patient was given epinephrine nebulizer treatment with improvement in his symptoms   In ED, noted to be tachycardia, tachypnea, O2 sats on room air. Sodium 122, CO2 19, BUN 22, creatinine 1.23 X-rays of the neck negative.  Chest x-ray showed diffuse interstitial groundglass opacities, mildly increased, representing edema superimposed on interstitial changes.  VQ scan negative for PE Patient was admitted for further workup.   Significant events 5/3: Underwent paracentesis, 3.9 L removed.  Oncology consulted 5/4: Underwent thoracentesis, left, 520 cc removed.  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  Throat closing/probable CAP -It is noted that on admission patient had presented with acute feeling of throat closing and shortness of breath. -Chest x-ray done with diffuse interstitial groundglass opacities. -V/Q done was negative for PE. -Plain films of the neck done unremarkable. -CT soft tissue neck showed no acute findings, left supraclavicular lymph node increased in size, left pleural effusion decreased. -Patient subsequently noted to have a pleural effusion and underwent thoracentesis with 520 cc of cloudy yellow chylous appearing fluid removed, which was consistent with malignant cells -Some clinical improvement. -PT/OT. -Noted to have been on doxycycline IV Rocephin and has completed a course of doxycycline. -Continue IV Rocephin. -Supportive care.  2.  Acute on chronic hyponatremia -Sodium noted at 130 on 10/10/2022 when patient recently discharged. -Sodium at 122 on admission. -Urine sodium < 10, serum osmolality 259, urine osmolality 758, concern for possible SIADH. -Patient started on chemotherapy on 10/15/2022, with Belzutifan. -Per nephrology today concern for hypervolemic hyponatremia, also concern for some form of HRS physiology as well as dilutional iso-SIADH. -Sodium level at 122 this morning. -Continue Lasix 20 mg daily,urea 15 g twice daily per nephrology recommendations. -Patient for paracentesis today with plan for weekly LVP's in the outpatient setting. -Status post IV albumin every 6 hours x 3 doses. -IV albumin post paracentesis today. -Per nephrology.  3.  Mildly elevated troponin -Troponins noted to be flattened.  Currently asymptomatic.  4.  Stage IV renal cell carcinoma with mets to the bone/malignant ascites/recurrent hypoalbuminemia/malignant pleural effusion -Patient noted to be followed by Dr. Katragadda/oncology recently completed XRT. -Recent CT with progression of disease with left  pleural effusion, ascites,  concern for lymphangitic spread. -Patient was supposed to have started on belzutifan on the day of admission. -Status post paracentesis 10/14/2022 with 3.9 L of fluid removed. -Awaiting another paracentesis today. -Status post left-sided thoracentesis on 5/4, 520 cc of fluid removed with cytology consistent with malignant cells. -Patient started on belzutifan per oncology recommendations during this hospitalization. -Dr.Rai discussed with oncology, Dr. Mosetta Putt, Pleurx and abdomen generally not recommended per IR due to risk of infection. -Dr.Rai also discussed with patient's primary oncologist, Dr. Ellin Saba on 5 6 and will arrange outpatient paracentesis at [facility] weekly.  He had discussion. -Supportive care. -Consulted palliative care for goals of care.  5.  General debility -Patient notes to be living alone with difficulty getting around. -Continue PT/OT.  6.  QT prolongation -QTc noted at 690 on presentation. -Avoid QT prolongation medications. -Keep potassium approximately 4, magnesium approximately 2. -Repeat EKG today.  7.  Hyperlipidemia -Statin.  8.  Hypertension -Norvasc on hold due to soft blood pressure. -Patient currently on oral Lasix per nephrology due to acute on chronic hyponatremia.  9.  Severe protein calorie malnutrition -Albumin noted significantly low at 1.9. -Estimated BMI 20.66 kg/m. -Nutritional supplementation.   DVT prophylaxis: Lovenox Code Status: Full Family Communication: Updated patient.  No family at bedside. Disposition: Likely home with home health when clinically stable.  Status is: Inpatient Remains inpatient appropriate because: Severity of illness   Consultants:  Nephrology: Dr.Schertz 10/16/2022 Oncology: Dr. Mosetta Putt 10/14/2022  Procedures:  CT soft tissue neck 10/13/2022 Chest x-ray 10/13/2022, 10/15/2022 Plain films neck 10/13/2022 VQ scan 10/13/2022 2D echo 10/17/2022 Successful right Ig power injectable Port-A-Cath placement per IR: Dr.  Deanne Coffer 10/15/2021 Ultrasound-guided therapeutic and diagnostic left-sided paracentesis per IR: Dr. Lowella Dandy 10/14/2022--- 3.9 L of cloudy yellow fluid removed Left-sided guided ultrasound-guided thoracentesis per IR--Dr. Grace Isaac 10/15/2022 with 520 cc of cloudy yellow, chylous appearing fluid.    Antimicrobials:  Anti-infectives (From admission, onward)    Start     Dose/Rate Route Frequency Ordered Stop   10/14/22 2200  doxycycline (VIBRA-TABS) tablet 100 mg        100 mg Oral Every 12 hours 10/14/22 1140 10/18/22 0845   10/13/22 2200  doxycycline (VIBRAMYCIN) 100 mg in sodium chloride 0.9 % 250 mL IVPB  Status:  Discontinued        100 mg 125 mL/hr over 120 Minutes Intravenous Every 12 hours 10/13/22 2050 10/14/22 1140   10/13/22 2100  cefTRIAXone (ROCEPHIN) 2 g in sodium chloride 0.9 % 100 mL IVPB        2 g 200 mL/hr over 30 Minutes Intravenous Every 24 hours 10/13/22 2048           Subjective: Patient laying in bed.  Denies any chest pain.  Chronic back pain and chronic abdominal pain per patient.  Asking when paracentesis will be done today.  Denies any further sensation of closing of his throat.  Objective: Vitals:   10/18/22 2240 10/19/22 0238 10/19/22 0710 10/19/22 0742  BP: 107/82 102/80 99/80 104/74  Pulse: 90 87 (!) 101 95  Resp: 20 18 (!) 30 19  Temp: 97.6 F (36.4 C) 97.7 F (36.5 C) 97.7 F (36.5 C)   TempSrc: Oral Oral Oral   SpO2: 95% 93% 91% 92%  Weight:      Height:        Intake/Output Summary (Last 24 hours) at 10/19/2022 1100 Last data filed at 10/19/2022 0900 Gross per 24 hour  Intake 1341.23 ml  Output  500 ml  Net 841.23 ml   Filed Weights   10/13/22 1306  Weight: 67.2 kg    Examination:  General exam: Appears calm and comfortable  Respiratory system: Slightly decreased breath sounds in the left base.  No wheezing, no crackles.  Fair air movement.  Speaking in full sentences.   Cardiovascular system: S1 & S2 heard, RRR. No JVD, murmurs, rubs, gallops  or clicks. No pedal edema. Gastrointestinal system: Abdomen is soft, distended, some diffuse tenderness to palpation (chronic per patient ), positive bowel sounds.  No rebound.  No guarding.  Central nervous system: Alert and oriented. No focal neurological deficits. Extremities: Symmetric 5 x 5 power. Skin: No rashes, lesions or ulcers Psychiatry: Judgement and insight appear normal. Mood & affect appropriate.     Data Reviewed: I have personally reviewed following labs and imaging studies  CBC: Recent Labs  Lab 10/13/22 0310 10/13/22 0316 10/15/22 0157 10/16/22 0428 10/17/22 0340 10/18/22 0400 10/19/22 0330  WBC 7.4   < > 6.3 6.5 7.2 7.3 7.4  NEUTROABS 6.1  --   --   --   --   --   --   HGB 14.6   < > 12.7* 13.6 13.1 13.0 12.0*  HCT 42.5   < > 36.9* 39.4 39.5 39.1 35.1*  MCV 80.8   < > 81.3 81.2 83.3 83.4 82.2  PLT 190   < > 193 206 222 223 203   < > = values in this interval not displayed.    Basic Metabolic Panel: Recent Labs  Lab 10/13/22 0530 10/14/22 0023 10/15/22 0157 10/16/22 0428 10/17/22 0340 10/17/22 1710 10/18/22 0400 10/18/22 1400 10/19/22 0330  NA  --  125* 125* 122* 125* 124* 121* 123* 122*  K  --  4.8 4.2 4.4 4.9  --  5.0  --  4.3  CL  --  92* 88* 87* 89*  --  90*  --  86*  CO2  --  20* 22 21* 21*  --  22  --  24  GLUCOSE  --  124* 105* 118* 105*  --  108*  --  97  BUN  --  22* 19 20 23*  --  20  --  30*  CREATININE  --  0.85 0.98 0.84 0.90  --  0.79  --  0.73  CALCIUM  --  8.2* 8.6* 8.4* 8.4*  --  8.3*  --  8.5*  MG  --  1.8  --   --   --   --   --   --  2.1  PHOS 3.3  --   --   --   --   --   --   --   --     GFR: Estimated Creatinine Clearance: 98 mL/min (by C-G formula based on SCr of 0.73 mg/dL).  Liver Function Tests: Recent Labs  Lab 10/13/22 0310 10/17/22 0340 10/18/22 0400 10/19/22 0330  AST 41 26 34 35  ALT 24 21 26 26   ALKPHOS 162* 176* 230* 232*  BILITOT 0.7 0.3 0.3 0.5  PROT 6.0* 5.5* 5.3* 5.7*  ALBUMIN 1.9* 1.9* 1.7*  2.7*    CBG: No results for input(s): "GLUCAP" in the last 168 hours.   Recent Results (from the past 240 hour(s))  SARS Coronavirus 2 by RT PCR (hospital order, performed in [facility] hospital lab) *cepheid single result test* Anterior Nasal Swab     Status: None   Collection Time: 10/13/22  8:12 AM  Specimen: Anterior Nasal Swab  Result Value Ref Range Status   SARS Coronavirus 2 by RT PCR NEGATIVE NEGATIVE Final    Comment: Performed at [facility], [address] [place], [place] [address]  Respiratory (~20 pathogens) panel by PCR     Status: None   Collection Time: 10/13/22  9:05 AM   Specimen: Anterior Nasal Swab; Respiratory  Result Value Ref Range Status   Adenovirus NOT DETECTED NOT DETECTED Final   Coronavirus 229E NOT DETECTED NOT DETECTED Final    Comment: (NOTE) The Coronavirus on the Respiratory Panel, DOES NOT test for the novel  Coronavirus (2019 nCoV)    Coronavirus HKU1 NOT DETECTED NOT DETECTED Final   Coronavirus NL63 NOT DETECTED NOT DETECTED Final   Coronavirus OC43 NOT DETECTED NOT DETECTED Final   Metapneumovirus NOT DETECTED NOT DETECTED Final   Rhinovirus / Enterovirus NOT DETECTED NOT DETECTED Final   Influenza A NOT DETECTED NOT DETECTED Final   Influenza B NOT DETECTED NOT DETECTED Final   Parainfluenza Virus 1 NOT DETECTED NOT DETECTED Final   Parainfluenza Virus 2 NOT DETECTED NOT DETECTED Final   Parainfluenza Virus 3 NOT DETECTED NOT DETECTED Final   Parainfluenza Virus 4 NOT DETECTED NOT DETECTED Final   Respiratory Syncytial Virus NOT DETECTED NOT DETECTED Final   Bordetella pertussis NOT DETECTED NOT DETECTED Final   Bordetella Parapertussis NOT DETECTED NOT DETECTED Final   Chlamydophila pneumoniae NOT DETECTED NOT DETECTED Final   Mycoplasma pneumoniae NOT DETECTED NOT DETECTED Final    Comment: Performed at [facility], [address], [place], [place] [address]  Body fluid culture w Gram Stain     Status: None    Collection Time: 10/14/22  3:14 PM   Specimen: Abdomen; Peritoneal Fluid  Result Value Ref Range Status   Specimen Description FLUID PERITONEAL ABDOMEN  Final   Special Requests NONE  Final   Gram Stain NO WBC SEEN NO ORGANISMS SEEN   Final   Culture   Final    NO GROWTH 3 DAYS Performed at [facility], [address], [place], [place] [address]    Report Status 10/17/2022 FINAL  Final  Body fluid culture w Gram Stain     Status: None   Collection Time: 10/15/22  1:32 PM   Specimen: PATH Cytology Pleural fluid  Result Value Ref Range Status   Specimen Description PLEURAL  Final   Special Requests NONE  Final   Gram Stain   Final    RARE WBC PRESENT,BOTH PMN AND MONONUCLEAR NO ORGANISMS SEEN    Culture   Final    NO GROWTH 3 DAYS Performed at [facility], [address], [place], [place] [address]    Report Status 10/18/2022 FINAL  Final         Radiology Studies: No results found.      Scheduled Meds:  belzutifan  120 mg Oral Daily   Chlorhexidine Gluconate Cloth  6 each Topical Daily   enoxaparin (LOVENOX) injection  40 mg Subcutaneous Q24H   feeding supplement  237 mL Oral BID BM   fluticasone furoate-vilanterol  1 puff Inhalation Daily   furosemide  20 mg Oral Daily   lidocaine  2 patch Transdermal Q24H   magnesium oxide  400 mg Oral Daily   rosuvastatin  5 mg Oral Daily   sodium chloride flush  10-40 mL Intracatheter Q12H   sodium chloride flush  3 mL Intravenous Q12H   umeclidinium  bromide  1 puff Inhalation Daily   urea  15 g Oral BID   Continuous Infusions:  cefTRIAXone (ROCEPHIN)  IV 2 g (10/18/22 2031)     LOS: 6 days    Time spent: 40 minutes    Ramiro Harvest, MD [facility]   To contact the attending provider between 7A-7P or the covering provider during after hours 7P-7A, please log into the web site [email or web address] and access using universal [place] password for that web site. If you do not have the password,  please call the hospital operator.  10/19/2022, 11:00 AM

## 2022-10-20 ENCOUNTER — Other Ambulatory Visit: Payer: 59

## 2022-10-20 ENCOUNTER — Inpatient Hospital Stay (HOSPITAL_COMMUNITY): Payer: 59

## 2022-10-20 ENCOUNTER — Ambulatory Visit: Payer: 59

## 2022-10-20 ENCOUNTER — Ambulatory Visit: Payer: 59 | Admitting: Hematology

## 2022-10-20 DIAGNOSIS — E871 Hypo-osmolality and hyponatremia: Secondary | ICD-10-CM | POA: Diagnosis not present

## 2022-10-20 DIAGNOSIS — J189 Pneumonia, unspecified organism: Secondary | ICD-10-CM | POA: Diagnosis not present

## 2022-10-20 DIAGNOSIS — I5020 Unspecified systolic (congestive) heart failure: Secondary | ICD-10-CM | POA: Diagnosis not present

## 2022-10-20 DIAGNOSIS — R7989 Other specified abnormal findings of blood chemistry: Secondary | ICD-10-CM | POA: Diagnosis not present

## 2022-10-20 DIAGNOSIS — R0602 Shortness of breath: Secondary | ICD-10-CM | POA: Diagnosis not present

## 2022-10-20 HISTORY — PX: IR PARACENTESIS: IMG2679

## 2022-10-20 LAB — BASIC METABOLIC PANEL
Anion gap: 12 (ref 5–15)
BUN: 30 mg/dL — ABNORMAL HIGH (ref 6–20)
CO2: 24 mmol/L (ref 22–32)
Calcium: 8.9 mg/dL (ref 8.9–10.3)
Chloride: 87 mmol/L — ABNORMAL LOW (ref 98–111)
Creatinine, Ser: 0.7 mg/dL (ref 0.61–1.24)
GFR, Estimated: >60 mL/min (ref >60)
Glucose, Bld: 109 mg/dL — ABNORMAL HIGH (ref 70–99)
Potassium: 4.8 mmol/L (ref 3.5–5.1)
Sodium: 123 mmol/L — ABNORMAL LOW (ref 135–145)

## 2022-10-20 LAB — CBC WITH DIFFERENTIAL/PLATELET
Abs Immature Granulocytes: 0.07 10*3/uL (ref 0.00–0.07)
Basophils Absolute: 0 10*3/uL (ref 0.0–0.1)
Basophils Relative: 0 %
Eosinophils Absolute: 0 10*3/uL (ref 0.0–0.5)
Eosinophils Relative: 0 %
HCT: 35.6 % — ABNORMAL LOW (ref 39.0–52.0)
Hemoglobin: 12.2 g/dL — ABNORMAL LOW (ref 13.0–17.0)
Immature Granulocytes: 1 %
Lymphocytes Relative: 4 %
Lymphs Abs: 0.3 10*3/uL — ABNORMAL LOW (ref 0.7–4.0)
MCH: 28.2 pg (ref 26.0–34.0)
MCHC: 34.3 g/dL (ref 30.0–36.0)
MCV: 82.2 fL (ref 80.0–100.0)
Monocytes Absolute: 0.8 10*3/uL (ref 0.1–1.0)
Monocytes Relative: 10 %
Neutro Abs: 7 10*3/uL (ref 1.7–7.7)
Neutrophils Relative %: 85 %
Platelets: 214 10*3/uL (ref 150–400)
RBC: 4.33 MIL/uL (ref 4.22–5.81)
RDW: 24.5 % — ABNORMAL HIGH (ref 11.5–15.5)
WBC: 8.2 10*3/uL (ref 4.0–10.5)
nRBC: 0 % (ref 0.0–0.2)

## 2022-10-20 LAB — MAGNESIUM: Magnesium: 1.8 mg/dL (ref 1.7–2.4)

## 2022-10-20 MED ORDER — ALBUMIN HUMAN 25 % IV SOLN
25.0000 g | Freq: Four times a day (QID) | INTRAVENOUS | Status: AC
  Administered 2022-10-20 – 2022-10-21 (×4): 25 g via INTRAVENOUS
  Filled 2022-10-20 (×4): qty 100

## 2022-10-20 MED ORDER — LIDOCAINE HCL 1 % IJ SOLN
INTRAMUSCULAR | Status: AC
  Filled 2022-10-20: qty 20

## 2022-10-20 MED ORDER — ALBUMIN HUMAN 25 % IV SOLN
25.0000 g | Freq: Four times a day (QID) | INTRAVENOUS | Status: DC
  Administered 2022-10-20: 25 g via INTRAVENOUS
  Filled 2022-10-20: qty 100

## 2022-10-20 MED ORDER — LIDOCAINE HCL 1 % IJ SOLN
20.0000 mL | Freq: Once | INTRAMUSCULAR | Status: AC
  Administered 2022-10-20: 10 mL via INTRADERMAL

## 2022-10-20 MED ORDER — LIDOCAINE-PRILOCAINE 2.5-2.5 % EX CREA: TOPICAL_CREAM | CUTANEOUS | Status: DC | PRN

## 2022-10-20 MED ORDER — FUROSEMIDE 20 MG PO TABS
20.0000 mg | ORAL_TABLET | Freq: Two times a day (BID) | ORAL | Status: DC
  Administered 2022-10-21 – 2022-10-23 (×5): 20 mg via ORAL
  Filled 2022-10-20 (×5): qty 1

## 2022-10-20 MED ORDER — MAGNESIUM SULFATE 2 GM/50ML IV SOLN
2.0000 g | Freq: Once | INTRAVENOUS | Status: AC
  Administered 2022-10-20: 2 g via INTRAVENOUS
  Filled 2022-10-20: qty 50

## 2022-10-20 NOTE — Progress Notes (Signed)
Nephrology Follow-Up Consult note   Assessment/Recommendations: Jacob Reynolds is a/an 57 y.o. male with a past medical history significant for stage IV renal cancer admitted for hyponatremia.       Hypervolemic hyponatremia: In the setting of volume overload with low urine sodium. Asymptomatic currently. 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 Trying to optimize volume -Continue urea 15 g twice daily and Lasix 20 mg daily -Agree with paracentesis; awaiting this to occur. Recommend primary team check on the status of this -Has plan for weekly LVPs outpatient -Continue to monitor sodium regularly -Sodium is stable and patient is not symptomatic. Needs LVP  Recurrent ascites: Status post paracentesis on 5/3.  Now with recurrence.  Recommend paracentesis.  Diuretics as above  Metastatic cancer: Renal cell.  Management per oncology  Hypertension: Given low blood pressures we stopped amlodipine    Recommendations conveyed to primary service.    [name] [facility] 10/20/2022 7:24 AM  ___________________________________________________________  CC: ascites, hyponatremia  Interval History/Subjective: Overall unchanged. Still with pain and still no LVP performed. Na overall stable at 123.   Medications:  Current Facility-Administered Medications  Medication Dose Route Frequency Provider Last Rate Last Admin   acetaminophen (TYLENOL) tablet 650 mg  650 mg Oral Q6H PRN Madelyn Flavors A, MD   650 mg at 10/19/22 1610   Or   acetaminophen (TYLENOL) suppository 650 mg  650 mg Rectal Q6H PRN Madelyn Flavors A, MD       albumin human 25 % solution 12.5 g  12.5 g Intravenous Q6H Rodolph Bong, MD 60 mL/hr at 10/20/22 0511 12.5 g at 10/20/22 0511   albuterol (PROVENTIL) (2.5 MG/3ML) 0.083% nebulizer solution 2.5 mg  2.5 mg Nebulization Q2H PRN Clydie Braun, MD       belzutifan [facility]) tablet 120 mg  120 mg Oral  Daily Malachy Mood, MD   120 mg at 10/19/22 1442   cefTRIAXone (ROCEPHIN) 2 g in sodium chloride 0.9 % 100 mL IVPB  2 g Intravenous Q24H Katrinka Blazing, Rondell A, MD 200 mL/hr at 10/19/22 2004 2 g at 10/19/22 2004   Chlorhexidine Gluconate Cloth 2 % PADS 6 each  6 each Topical Daily Madelyn Flavors A, MD   6 each at 10/19/22 1010   enoxaparin (LOVENOX) injection 40 mg  40 mg Subcutaneous Q24H Smith, Rondell A, MD   40 mg at 10/19/22 0837   EPINEPHrine (EPI-PEN) injection 0.3 mg  0.3 mg Intramuscular Once PRN Tilden Fossa, MD       feeding supplement (ENSURE ENLIVE / ENSURE PLUS) liquid 237 mL  237 mL Oral BID BM Opyd, Lavone Neri, MD   237 mL at 10/19/22 1441   fluticasone furoate-vilanterol (BREO ELLIPTA) 200-25 MCG/ACT 1 puff  1 puff Inhalation Daily Rai, Ripudeep K, MD   1 puff at 10/19/22 0838   furosemide (LASIX) tablet 20 mg  20 mg Oral Daily [name], MD   20 mg at 10/19/22 1005   HYDROmorphone (DILAUDID) tablet 2 mg  2 mg Oral Q6H PRN Madelyn Flavors A, MD   2 mg at 10/20/22 0235   lidocaine (LIDODERM) 5 % 2 patch  2 patch Transdermal Q24H Madelyn Flavors A, MD   2 patch at 10/19/22 1214   loperamide (IMODIUM) capsule 2 mg  2 mg Oral PRN Madelyn Flavors A, MD       magnesium oxide (MAG-OX) tablet 400 mg  400 mg Oral Daily Rai,  Ripudeep K, MD   400 mg at 10/19/22 1005   rosuvastatin (CRESTOR) tablet 5 mg  5 mg Oral Daily Katrinka Blazing, Rondell A, MD   5 mg at 10/19/22 1005   sodium chloride flush (NS) 0.9 % injection 10-40 mL  10-40 mL Intracatheter Q12H Smith, Rondell A, MD   10 mL at 10/19/22 1009   sodium chloride flush (NS) 0.9 % injection 10-40 mL  10-40 mL Intracatheter PRN Smith, Rondell A, MD       sodium chloride flush (NS) 0.9 % injection 3 mL  3 mL Intravenous Q12H Smith, Rondell A, MD   3 mL at 10/17/22 2131   trimethobenzamide (TIGAN) injection 200 mg  200 mg Intramuscular Q6H PRN Smith, Rondell A, MD       umeclidinium bromide (INCRUSE ELLIPTA) 62.5 MCG/ACT 1 puff  1 puff Inhalation Daily  Rai, Ripudeep K, MD   1 puff at 10/19/22 0838   urea (URE-NA) oral packet 15 g  15 g Oral BID [name], MD   15 g at 10/19/22 2005   Facility-Administered Medications Ordered in Other Encounters  Medication Dose Route Frequency Provider Last Rate Last Admin   diphenhydrAMINE (BENADRYL) 50 MG/ML injection            famotidine (PEPCID) 20-0.9 MG/50ML-% IVPB               Review of Systems: 10 systems reviewed and negative except per interval history/subjective  Physical Exam: Vitals:   10/20/22 0514 10/20/22 0709  BP: 105/82 106/87  Pulse: 96 (!) 102  Resp: 17 (!) 23  Temp: (!) 96.8 F (36 C) 98.2 F (36.8 C)  SpO2: 94% 92%   No intake/output data recorded.  Intake/Output Summary (Last 24 hours) at 10/20/2022 0724 Last data filed at 10/20/2022 0300 Gross per 24 hour  Intake 1258.99 ml  Output 0 ml  Net 1258.99 ml   Constitutional: chronically ill appearing ENMT: ears and nose without scars or lesions, MMM CV: normal rate, no edema Respiratory: bilateral chest rise, normal work of breathing Gastrointestinal: distended, nontender Skin: no visible lesions or rashes Psych: alert, judgement/insight appropriate, appropriate mood and affect   Test Results I personally reviewed new and old clinical labs and radiology tests Lab Results  Component Value Date   NA 123 (L) 10/20/2022   K 4.8 10/20/2022   CL 87 (L) 10/20/2022   CO2 24 10/20/2022   BUN 30 (H) 10/20/2022   CREATININE 0.70 10/20/2022   CALCIUM 8.9 10/20/2022   ALBUMIN 2.7 (L) 10/19/2022   PHOS 3.3 10/13/2022    CBC Recent Labs  Lab 10/18/22 0400 10/19/22 0330 10/20/22 0240  WBC 7.3 7.4 8.2  NEUTROABS  --   --  7.0  HGB 13.0 12.0* 12.2*  HCT 39.1 35.1* 35.6*  MCV 83.4 82.2 82.2  PLT 223 203 214

## 2022-10-20 NOTE — Consult Note (Addendum)
Cardiology Consultation   Patient ID: Jacob Reynolds MRN: [id number]; DOB: [birth year]  Admit date: 10/13/2022 Date of Consult: 10/20/2022  PCP:  [name], FNP   [facility] Providers Cardiologist:  New   Patient Profile:   Jacob Reynolds is a 57 y.o. 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  History of Present Illness:   Jacob Reynolds is a 57 year old male with past medical history noted above.  He has never been evaluated by cardiology.  Presented to the ED on 5/2 with complaints of sensation of his throat closing up.  Concern for allergic reaction.  Has been followed by Oncology at [facility], Dr. Ellin Saba. Initially dx with RCC 08/2021. Has been treated with Helene Shoe (11/2021) and Cabozantinib (07/2022)  He was recently hospitalized 4/27 to 4/29 with intractable nausea and vomiting with abdominal pain, ascites and pleural effusion requiring paracentesis and thoracentesis.  Palliative care was consulted at that time and noted patient wanted to continue with full scope of treatment.  Admission labs showed sodium 122, potassium 5, creatinine 1.2, albumin 1.9, high-sensitivity troponin 24>>24, procalcitonin 0.63, WBC 7.4, hemoglobin 14.6.  Chest x-ray showed diffuse interstitial and groundglass opacity concerning for edema superimposed on interstitial changes, small left pleural effusion.  Neck x-ray negative.  CT neck with no acute finding, widely patent airway, left supraclavicular lymph nodes which were increased in size since PET scan from 07/2022, left pleural effusion.  VQ scan negative for PE. He was admitted to internal medicine and started on empiric antibiotics.  Underwent paracentesis on 5/3 with 3.9 L removed.  Underwent left sided thoracentesis, 520 cc removed 5/4.  Seen by oncology and started on Belzutifan.  Repeat paracentesis 5/9 with  1.2 L removed.   Echo 5/6 with LVEF of 45 to 50%, global hypokinesis, LV was not well-visualized but did appear abnormal, normal RV patient is, no significant valvular disease.  It was noted a cardiac MRI may allow for better view to quantify LV systolic function. Cardiology asked to evaluate.   In talking with the patient, he has had no anginal symptoms prior to admission. Though he is limited in his activity. Denies any family hx of CAD. Prior tobacco use.   Past Medical History:  Diagnosis Date   Cancer (HCC)    Plantar fasciitis    Port-A-Cath in place 11/10/2021    Past Surgical History:  Procedure Laterality Date   IR IMAGING GUIDED PORT INSERTION  10/15/2021   IR PARACENTESIS  10/14/2022   IR PARACENTESIS  10/20/2022     Home Medications:  Prior to Admission medications   Medication Sig Start Date End Date Taking? Authorizing Provider  albuterol (VENTOLIN HFA) 108 (90 Base) MCG/ACT inhaler Inhale 2 puffs into the lungs every 6 (six) hours as needed for wheezing or shortness of breath. 08/08/22  Yes [name], FNP  amLODipine (NORVASC) 10 MG tablet Take 1 tablet (10 mg total) by mouth daily. 09/20/22  Yes Doreatha Massed, MD  Budeson-Glycopyrrol-Formoterol (BREZTRI AEROSPHERE) 160-9-4.8 MCG/ACT AERO Inhale 2 puffs into the lungs 2 (two) times daily. 08/08/22  Yes [name], FNP  HYDROcodone-acetaminophen (NORCO) 10-325 MG tablet Take 1 tablet by mouth every 12 (twelve) hours as needed. Patient taking differently: Take 1 tablet by mouth every 12 (twelve) hours as needed for moderate pain or severe pain. 09/05/22  Yes Doreatha Massed,  MD  HYDROmorphone (DILAUDID) 2 MG tablet Take 1 tablet (2 mg total) by mouth every 6 (six) hours as needed for severe pain. 10/11/22  Yes Doreatha Massed, MD  loperamide (IMODIUM) 2 MG capsule Take 1 capsule (2 mg total) by mouth as needed for diarrhea or loose stools. 08/26/22  Yes Pennington, Rebekah M, PA-C  ondansetron  (ZOFRAN-ODT) 8 MG disintegrating tablet Take 1 tablet (8 mg total) by mouth every 8 (eight) hours as needed for nausea or vomiting. 09/20/22  Yes Doreatha Massed, MD  rosuvastatin (CRESTOR) 5 MG tablet Take 1 tablet (5 mg total) by mouth daily. 08/08/22  Yes [name], FNP  belzutifan [facility]) 40 MG tablet Take 3 tablets (120 mg total) by mouth daily. 10/10/22   Doreatha Massed, MD  magnesium oxide (MAG-OX) 400 (240 Mg) MG tablet Take 1 tablet (400 mg total) by mouth daily. Patient not taking: Reported on 10/13/2022 10/04/22   Doreatha Massed, MD  potassium chloride SA (KLOR-CON M) 20 MEQ tablet Take 1 tablet (20 mEq total) by mouth 2 (two) times daily. Patient not taking: Reported on 10/13/2022 10/04/22   Doreatha Massed, MD    Inpatient Medications: Scheduled Meds:  belzutifan  120 mg Oral Daily   Chlorhexidine Gluconate Cloth  6 each Topical Daily   enoxaparin (LOVENOX) injection  40 mg Subcutaneous Q24H   feeding supplement  237 mL Oral BID BM   fluticasone furoate-vilanterol  1 puff Inhalation Daily   furosemide  20 mg Oral Daily   lidocaine  2 patch Transdermal Q24H   magnesium oxide  400 mg Oral Daily   rosuvastatin  5 mg Oral Daily   sodium chloride flush  10-40 mL Intracatheter Q12H   sodium chloride flush  3 mL Intravenous Q12H   umeclidinium bromide  1 puff Inhalation Daily   urea  15 g Oral BID   Continuous Infusions:  albumin human 25 g (10/20/22 1301)   cefTRIAXone (ROCEPHIN)  IV 2 g (10/19/22 2004)   PRN Meds: acetaminophen **OR** acetaminophen, albuterol, EPINEPHrine, HYDROmorphone, loperamide, sodium chloride flush, trimethobenzamide  Allergies:    Allergies  Allergen Reactions   Iodinated Contrast Media Anaphylaxis    Throat felt like closed Was in hospital for allergy and almost died- per patient  Pt verified that it was from CT contrast   Iodine     Throat felt like closed Was in hospital for allergy and almost died- per patient      Social History:   Social History   Socioeconomic History   Marital status: Single    Spouse name: Not on file   Number of children: 1   Years of education: 11   Highest education level: 11th grade  Occupational History   Not on file  Tobacco Use   Smoking status: Every Day    Packs/day: 0.20    Years: 36.00    Additional pack years: 0.00    Total pack years: 7.20    Types: Cigarettes    Start date: 06/13/1978   Smokeless tobacco: Never  Vaping Use   Vaping Use: Never used  Substance and Sexual Activity   Alcohol use: Not Currently    Alcohol/week: 7.0 standard drinks of alcohol    Types: 7 Cans of beer per week   Drug use: Not Currently    Frequency: 1.0 times per week    Types: Marijuana   Sexual activity: Not on file  Other Topics Concern   Not on file  Social History Narrative  Not on file   Social Determinants of Health   Financial Resource Strain: Not on file  Food Insecurity: Not on file  Transportation Needs: Not on file  Physical Activity: Not on file  Stress: Not on file  Social Connections: Not on file  Intimate Partner Violence: Not on file    Family History:    Family History  Problem Relation Age of Onset   Hypertension Mother    Parkinson's disease Father    Breast cancer Maternal Aunt        dx >50   Lung cancer Paternal Grandfather    Breast cancer Cousin        dx 29s   Prostate cancer Cousin        dx 52s     ROS:  Please see the history of present illness.   All other ROS reviewed and negative.     Physical Exam/Data:   Vitals:   10/20/22 0800 10/20/22 0917 10/20/22 1000 10/20/22 1050  BP:  106/78 110/85 110/81  Pulse: 98  91 97  Resp: (!) 23  19 (!) 22  Temp:   98.1 F (36.7 C) 97.6 F (36.4 C)  TempSrc:   Oral Oral  SpO2: 99%  90%   Weight:      Height:        Intake/Output Summary (Last 24 hours) at 10/20/2022 1404 Last data filed at 10/20/2022 0701 Gross per 24 hour  Intake 694.27 ml  Output 0 ml  Net 694.27  ml      10/13/2022    1:06 PM 10/11/2022   11:04 AM 10/09/2022    4:23 AM  Last 3 Weights  Weight (lbs) 148 lb 2.4 oz 146 lb 3.2 oz 160 lb 4.4 oz  Weight (kg) 67.2 kg 66.316 kg 72.7 kg     Body mass index is 20.66 kg/m.  General: Thin cachectic male appearing older than stated age HEENT: normal Neck: no JVD Vascular: No carotid bruits; Distal pulses 2+ bilaterally Cardiac:  normal S1, S2; Tachy; no murmur  Lungs:  clear to auscultation bilaterally, no wheezing, rhonchi or rales  Abd: large protuberant abd Ext: no edema Musculoskeletal:  No deformities, BUE and BLE strength normal and equal Skin: warm and dry  Neuro:  CNs 2-12 intact, no focal abnormalities noted Psych:  Normal affect   EKG:  The EKG was personally reviewed and demonstrates:  Sinus Tachycardia, 104bpm  Telemetry:  Telemetry was personally reviewed and demonstrates:  Sinus tachycardia rates  Relevant CV Studies:  Echo: 10/17/2022  IMPRESSIONS     1. Left ventricular ejection fraction, by estimation, is 45 to 50%. The  LV was not well-visualized but does appear abnormal. The left ventricle  has mildly decreased function. The left ventricle demonstrates global  hypokinesis. Cardiac MRI may allow a  better view and quantification of LV systolic function. Left ventricular  diastolic parameters are consistent with Grade I diastolic dysfunction  (impaired relaxation).   2. Right ventricular systolic function is normal. The right ventricular  size is normal. Tricuspid regurgitation signal is inadequate for assessing  PA pressure.   3. The mitral valve is normal in structure. No evidence of mitral valve  regurgitation. No evidence of mitral stenosis.   4. The aortic valve is tricuspid. Aortic valve regurgitation is not  visualized. No aortic stenosis is present.   5. Aortic dilatation noted. There is mild dilatation of the aortic root,  measuring 38 mm.   6. The inferior vena cava is  normal in size with greater  than 50%  respiratory variability, suggesting right atrial pressure of 3 mmHg.   FINDINGS   Left Ventricle: Left ventricular ejection fraction, by estimation, is 45  to 50%. The left ventricle has mildly decreased function. The left  ventricle demonstrates global hypokinesis. The left ventricular internal  cavity size was normal in size. There is   no left ventricular hypertrophy. Left ventricular diastolic parameters  are consistent with Grade I diastolic dysfunction (impaired relaxation).   Right Ventricle: The right ventricular size is normal. No increase in  right ventricular wall thickness. Right ventricular systolic function is  normal. Tricuspid regurgitation signal is inadequate for assessing PA  pressure.   Left Atrium: Left atrial size was normal in size.   Right Atrium: Right atrial size was normal in size.   Pericardium: There is no evidence of pericardial effusion.   Mitral Valve: The mitral valve is normal in structure. Mild to moderate  mitral annular calcification. No evidence of mitral valve regurgitation.  No evidence of mitral valve stenosis.   Tricuspid Valve: The tricuspid valve is normal in structure. Tricuspid  valve regurgitation is not demonstrated.   Aortic Valve: The aortic valve is tricuspid. Aortic valve regurgitation is  not visualized. No aortic stenosis is present.   Pulmonic Valve: The pulmonic valve was normal in structure. Pulmonic valve  regurgitation is not visualized.   Aorta: Aortic dilatation noted. There is mild dilatation of the aortic  root, measuring 38 mm.   Venous: The inferior vena cava is normal in size with greater than 50%  respiratory variability, suggesting right atrial pressure of 3 mmHg.   IAS/Shunts: No atrial level shunt detected by color flow Doppler.       Laboratory Data:  High Sensitivity Troponin:   Recent Labs  Lab 10/13/22 0310 10/13/22 0530  TROPONINIHS 24* 24*     Chemistry Recent Labs  Lab  10/14/22 0023 10/15/22 0157 10/18/22 0400 10/18/22 1400 10/19/22 0330 10/20/22 0240  NA 125*   < > 121* 123* 122* 123*  K 4.8   < > 5.0  --  4.3 4.8  CL 92*   < > 90*  --  86* 87*  CO2 20*   < > 22  --  24 24  GLUCOSE 124*   < > 108*  --  97 109*  BUN 22*   < > 20  --  30* 30*  CREATININE 0.85   < > 0.79  --  0.73 0.70  CALCIUM 8.2*   < > 8.3*  --  8.5* 8.9  MG 1.8  --   --   --  2.1 1.8  GFRNONAA >60   < > >60  --  >60 >60  ANIONGAP 13   < > 9  --  12 12   < > = values in this interval not displayed.    Recent Labs  Lab 10/17/22 0340 10/18/22 0400 10/19/22 0330  PROT 5.5* 5.3* 5.7*  ALBUMIN 1.9* 1.7* 2.7*  AST 26 34 35  ALT 21 26 26   ALKPHOS 176* 230* 232*  BILITOT 0.3 0.3 0.5   Lipids No results for input(s): "CHOL", "TRIG", "HDL", "LABVLDL", "LDLCALC", "CHOLHDL" in the last 168 hours.  Hematology Recent Labs  Lab 10/18/22 0400 10/19/22 0330 10/20/22 0240  WBC 7.3 7.4 8.2  RBC 4.69 4.27 4.33  HGB 13.0 12.0* 12.2*  HCT 39.1 35.1* 35.6*  MCV 83.4 82.2 82.2  MCH 27.7 28.1 28.2  MCHC 33.2  34.2 34.3  RDW 25.0* 24.5* 24.5*  PLT 223 203 214   Thyroid No results for input(s): "TSH", "FREET4" in the last 168 hours.  BNPNo results for input(s): "BNP", "PROBNP" in the last 168 hours.  DDimer No results for input(s): "DDIMER" in the last 168 hours.   Radiology/Studies:  IR Paracentesis  Result Date: 10/20/2022 INDICATION: Patient with recently diagnosed metastatic renal cancer with recurrent ascites and pleural effusion. Request for therapeutic paracentesis. EXAM: ULTRASOUND GUIDED THERAPEUTIC PARACENTESIS MEDICATIONS: 6 mL 1% lidocaine COMPLICATIONS: None immediate. PROCEDURE: Informed written consent was obtained from the patient after a discussion of the risks, benefits and alternatives to treatment. A timeout was performed prior to the initiation of the procedure. Initial ultrasound scanning demonstrates a small amount of ascites within the right upper abdominal  quadrant. The right lower abdomen was prepped and draped in the usual sterile fashion. 1% lidocaine was used for local anesthesia. Following this, a 6 Fr Safe-T-Centesis catheter was introduced. An ultrasound image was saved for documentation purposes. The paracentesis was performed. The catheter was removed and a dressing was applied. The patient tolerated the procedure well without immediate post procedural complication. FINDINGS: A total of approximately 1.2 L of cloudy yellow (chylous appearing) fluid was removed. IMPRESSION: Successful ultrasound-guided paracentesis yielding 1.2 liters of peritoneal fluid. Performed by Lynnette Caffey, PA-C Electronically Signed   By: Irish Lack M.D.   On: 10/20/2022 09:55   ECHOCARDIOGRAM COMPLETE  Result Date: 10/17/2022    ECHOCARDIOGRAM REPORT   Patient Name:   Jacob Reynolds Date of Exam: 10/17/2022 Medical Rec #:  [id number]      Height:       71.0 in Accession #:    [id number]     Weight:       148.1 lb Date of Birth:  09-Nov-1965     BSA:          1.856 m Patient Age:    56 years       BP:           103/79 mmHg Patient Gender: M              HR:           93 bpm. Exam Location:  Inpatient Procedure: 2D Echo, Cardiac Doppler and Color Doppler Indications:    chf  History:        Patient has no prior history of Echocardiogram examinations.  Sonographer:    Mike Gip Referring Phys: [id number] ROBERT SCHERTZ IMPRESSIONS  1. Left ventricular ejection fraction, by estimation, is 45 to 50%. The LV was not well-visualized but does appear abnormal. The left ventricle has mildly decreased function. The left ventricle demonstrates global hypokinesis. Cardiac MRI may allow a better view and quantification of LV systolic function. Left ventricular diastolic parameters are consistent with Grade I diastolic dysfunction (impaired relaxation).  2. Right ventricular systolic function is normal. The right ventricular size is normal. Tricuspid regurgitation signal is inadequate for  assessing PA pressure.  3. The mitral valve is normal in structure. No evidence of mitral valve regurgitation. No evidence of mitral stenosis.  4. The aortic valve is tricuspid. Aortic valve regurgitation is not visualized. No aortic stenosis is present.  5. Aortic dilatation noted. There is mild dilatation of the aortic root, measuring 38 mm.  6. The inferior vena cava is normal in size with greater than 50% respiratory variability, suggesting right atrial pressure of 3 mmHg. FINDINGS  Left Ventricle: Left ventricular ejection fraction, by  estimation, is 45 to 50%. The left ventricle has mildly decreased function. The left ventricle demonstrates global hypokinesis. The left ventricular internal cavity size was normal in size. There is  no left ventricular hypertrophy. Left ventricular diastolic parameters are consistent with Grade I diastolic dysfunction (impaired relaxation). Right Ventricle: The right ventricular size is normal. No increase in right ventricular wall thickness. Right ventricular systolic function is normal. Tricuspid regurgitation signal is inadequate for assessing PA pressure. Left Atrium: Left atrial size was normal in size. Right Atrium: Right atrial size was normal in size. Pericardium: There is no evidence of pericardial effusion. Mitral Valve: The mitral valve is normal in structure. Mild to moderate mitral annular calcification. No evidence of mitral valve regurgitation. No evidence of mitral valve stenosis. Tricuspid Valve: The tricuspid valve is normal in structure. Tricuspid valve regurgitation is not demonstrated. Aortic Valve: The aortic valve is tricuspid. Aortic valve regurgitation is not visualized. No aortic stenosis is present. Pulmonic Valve: The pulmonic valve was normal in structure. Pulmonic valve regurgitation is not visualized. Aorta: Aortic dilatation noted. There is mild dilatation of the aortic root, measuring 38 mm. Venous: The inferior vena cava is normal in size with  greater than 50% respiratory variability, suggesting right atrial pressure of 3 mmHg. IAS/Shunts: No atrial level shunt detected by color flow Doppler.  LEFT VENTRICLE PLAX 2D LVIDd:         4.20 cm      Diastology LVIDs:         2.50 cm      LV e' medial:    5.22 cm/s LV PW:         0.90 cm      LV E/e' medial:  5.8 LV IVS:        1.00 cm      LV e' lateral:   18.10 cm/s LVOT diam:     2.20 cm      LV E/e' lateral: 1.7 LV SV:         50 LV SV Index:   27 LVOT Area:     3.80 cm  LV Volumes (MOD) LV vol d, MOD A2C: 99.4 ml LV vol d, MOD A4C: 107.0 ml LV vol s, MOD A2C: 43.6 ml LV vol s, MOD A4C: 47.5 ml LV SV MOD A2C:     55.8 ml LV SV MOD A4C:     107.0 ml LV SV MOD BP:      60.2 ml RIGHT VENTRICLE             IVC RV Basal diam:  3.10 cm     IVC diam: 1.20 cm RV S prime:     12.10 cm/s TAPSE (M-mode): 1.6 cm LEFT ATRIUM           Index        RIGHT ATRIUM           Index LA diam:      2.70 cm 1.45 cm/m   RA Area:     11.80 cm LA Vol (A4C): 24.1 ml 12.98 ml/m  RA Volume:   22.70 ml  12.23 ml/m  AORTIC VALVE LVOT Vmax:   73.30 cm/s LVOT Vmean:  49.500 cm/s LVOT VTI:    0.132 m  AORTA Ao Root diam: 3.80 cm Ao Asc diam:  3.20 cm MITRAL VALVE MV Area (PHT): 4.12 cm    SHUNTS MV Decel Time: 184 msec    Systemic VTI:  0.13 m MV E velocity: 30.10 cm/s  Systemic Diam: 2.20 cm MV A velocity: 47.40 cm/s MV E/A ratio:  0.64 Dalton McleanMD Electronically signed by Wilfred Lacy Signature Date/Time: 10/17/2022/10:30:22 AM    Final      Assessment and Plan:   Jacob Reynolds is a 57 y.o. 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  HFrEF -- Echo 5/6 with LVEF of 45 to 50%, LV not well visualized but did appear abnormal, global hypokinesis, grade 1 diastolic dysfunction, normal RV.  Unclear etiology though suspect this may be a stress-induced cardiomyopathy given his illnesses.  He denies any  anginal symptoms prior to admission. He does not have significant volume overload on exam at present, though has required paracentesis x 2, thoracentesis x 1 secondary to RCC -- Favor conservative management, therapy is limited in the setting of soft blood pressures.  -- palliative care consult pending  Stage IV renal cell cancer with metastases -- follows with oncology in [place], seen by Dr. Mosetta Putt during this admission -- currently on belzutifan   Acute on Chronic Hyponatremia -- in the setting of overload and low urine Na+ -- receiving IV albumin, lasix  -- nephrology following  Ascites -- s/p paracentesis x2  Pleural effusion -- left sided thoracentesis x1 with 520cc fluid removed, malignant cells   CAP -- antibiotics per primary   For questions or updates, please contact [facility] Please consult [email or web address] for contact info under    Signed, Laverda Page, NP  10/20/2022 2:04 PM

## 2022-10-20 NOTE — Procedures (Signed)
PROCEDURE SUMMARY:  Successful image-guided paracentesis from the right upper abdomen.  Yielded 1.2 liters of cloudy yellow (chylous appearing) fluid.  No immediate complications.  EBL < 1 mL Patient tolerated well.   Specimen was not sent for labs.  Please see imaging section of Epic for full dictation.  Villa Herb PA-C 10/20/2022 9:44 AM

## 2022-10-20 NOTE — Progress Notes (Addendum)
PROGRESS NOTE    Jacob Reynolds  ZOX:[id number] DOB: 10/11/1965 DOA: 10/13/2022 PCP: [name], FNP    Chief Complaint  Patient presents with   Shortness of Breath    Brief Narrative:  Patient is a 57 year old male with HTN, stage IV renal cancer with metastasis to bone presented with feeling of " throat closing up".  He questioned if it was an allergic reaction as he was on several medications. He had not darted belzutifan 120 mg daily he had as he was supposed to start today. Patient just recently hospitalized 4/27-4/29 with intractable nausea, vomiting, and abdominal Per patient, since being home he had increased abdominal swelling, dry mouth.  His throat symptoms improved in the ER however still felt some discomfort.  He previously had thrush.  Also reported nausea, back pain, fatigue, shortness of breath, substernal chest discomfort during the episode. En route with EMS, patient was given epinephrine nebulizer treatment with improvement in his symptoms   In ED, noted to be tachycardia, tachypnea, O2 sats on room air. Sodium 122, CO2 19, BUN 22, creatinine 1.23 X-rays of the neck negative.  Chest x-ray showed diffuse interstitial groundglass opacities, mildly increased, representing edema superimposed on interstitial changes.  VQ scan negative for PE Patient was admitted for further workup.   Significant events 5/3: Underwent paracentesis, 3.9 L removed.  Oncology consulted 5/4: Underwent thoracentesis, left, 520 cc removed.  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  Throat closing/probable CAP -It is noted that on admission patient had presented with acute feeling of throat closing and shortness of breath. -Chest x-ray done with diffuse interstitial groundglass opacities. -V/Q done was negative for PE. -Plain films of the neck done unremarkable. -CT soft tissue neck showed no acute findings, left supraclavicular lymph node increased in size, left pleural effusion decreased. -Patient subsequently noted to have a pleural effusion and underwent thoracentesis with 520 cc of cloudy yellow chylous appearing fluid removed, which was consistent with malignant cells -Some clinical improvement. -PT/OT. -Noted to have been on doxycycline IV Rocephin and has completed a course of doxycycline. -Continue IV Rocephin. -Supportive care.  2.  Acute on chronic hyponatremia -Sodium noted at 130 on 10/10/2022 when patient recently discharged. -Sodium at 122 on admission. -Urine sodium < 10, serum osmolality 259, urine osmolality 758, concern for possible SIADH. -Patient started on chemotherapy on 10/15/2022, with Belzutifan. -Per nephrology concern for hypervolemic hyponatremia, also concern for some form of HRS physiology as well as dilutional iso-SIADH. -Sodium level at 123 this morning. -Continue Lasix 20 mg daily,urea 15 g twice daily per nephrology recommendations. -Patient underwent paracentesis today with plan for weekly LVP's in the outpatient setting. -Status post IV albumin every 6 hours x 3 doses. -Patient started back on scheduled IV albumin every 6 hours x 5 doses. -Per nephrology.  3.  Mildly elevated troponin/Abn 2 d echo -Troponins noted to be flattened.  -2D echo with EF of 45 to 50%, left ventricle with mildly decreased function, LV was not well-visualized but appears abnormal, left ventricular global hypokinesis cardiac MRI may  allow better view and quantification of LV systolic function.   -Patient requesting to be  seen by cardiology for further evaluation and recommendations on abnormal 2D echo.   -Currently asymptomatic.   -Continue Crestor, Lasix. -Will consult with cardiology for further evaluation and management.  4.  Stage IV renal cell carcinoma with mets to the bone/malignant ascites/recurrent hypoalbuminemia/malignant pleural effusion -Patient noted to be followed by Dr. Katragadda/oncology recently completed XRT. -Recent CT with progression of disease with left pleural effusion, ascites, concern for lymphangitic spread. -Patient was supposed to have started on belzutifan on the day of admission. -Status post paracentesis 10/14/2022 with 3.9 L of fluid removed. -Status post paracentesis 10/20/2022 with 1.2 L of fluid removed. -Currently on scheduled IV albumin. -Status post left-sided thoracentesis on 5/4, 520 cc of fluid removed with cytology consistent with malignant cells. -Patient started on belzutifan per oncology recommendations during this hospitalization. -Dr.Rai discussed with oncology, Dr. Mosetta Putt, Pleurx and abdomen generally not recommended per IR due to risk of infection. -Dr.Rai also discussed with patient's primary oncologist, Dr. Ellin Saba on 5 6 and will arrange outpatient paracentesis at [facility] weekly. -Supportive care. -Consulted palliative care for goals of care.  5.  General debility -Patient notes to be living alone with difficulty getting around. -Continue PT/OT. -Will likely need home health on discharge.  6.  QT prolongation -QTc noted at 690 on presentation. -Avoid QT prolongation medications. -Keep potassium approximately 4, magnesium approximately 2. -Repeat EKG on 10/19/2022 with resolution of QT prolongation.  7.  Hyperlipidemia -Continue statin.   8.  Hypertension -Norvasc was discontinued due to soft blood pressure.  -Patient currently on oral Lasix per nephrology due to acute on chronic hyponatremia.  9.  Severe protein calorie malnutrition -Albumin  noted significantly low at 1.9. -Estimated BMI 20.66 kg/m. -Nutritional supplementation.   DVT prophylaxis: Lovenox Code Status: Full Family Communication: Updated patient.  No family at bedside. Disposition: Likely home with home health when clinically stable with significant improvement with hyponatremia and cleared by nephrology..  Status is: Inpatient Remains inpatient appropriate because: Severity of illness   Consultants:  Nephrology: Dr.Schertz 10/16/2022 Oncology: Dr. Mosetta Putt 10/14/2022  Procedures:  CT soft tissue neck 10/13/2022 Chest x-ray 10/13/2022, 10/15/2022 Plain films neck 10/13/2022 VQ scan 10/13/2022 2D echo 10/17/2022 Successful right Ig power injectable Port-A-Cath placement per IR: Dr. Deanne Coffer 10/15/2021 Ultrasound-guided therapeutic and diagnostic left-sided paracentesis per IR: Dr. Lowella Dandy 10/14/2022--- 3.9 L of cloudy yellow fluid removed Left-sided guided ultrasound-guided thoracentesis per IR--Dr. Grace Isaac 10/15/2022 with 520 cc of cloudy yellow, chylous appearing fluid. Therapeutic ultrasound-guided paracentesis 1.2 L of cloudy yellow fluid removed per IR: Lynnette Caffey, PA 10/20/2022   Antimicrobials:  Anti-infectives (From admission, onward)    Start     Dose/Rate Route Frequency Ordered Stop   10/14/22 2200  doxycycline (VIBRA-TABS) tablet 100 mg        100 mg Oral Every 12 hours 10/14/22 1140 10/18/22 0845   10/13/22 2200  doxycycline (VIBRAMYCIN) 100 mg in sodium chloride 0.9 % 250 mL IVPB  Status:  Discontinued        100 mg 125 mL/hr over 120 Minutes Intravenous Every 12 hours 10/13/22 2050 10/14/22 1140   10/13/22 2100  cefTRIAXone (ROCEPHIN) 2 g in sodium chloride 0.9 % 100 mL IVPB        2 g 200 mL/hr over 30 Minutes Intravenous Every 24 hours 10/13/22 2048           Subjective: Laying in bed.  Just returned from ultrasound-guided  paracentesis.  States abdominal pain improved post paracentesis.  Still with lower back pain and pain in his tailbone.  No chest  pain.  No significant shortness of breath.  States O2 sats dropped to 88% when laying flat last night.  Asking when he is going to be able to be discharged home.   Objective: Vitals:   10/20/22 0800 10/20/22 0917 10/20/22 1000 10/20/22 1050  BP:  106/78 110/85 110/81  Pulse: 98  91 97  Resp: (!) 23  19 (!) 22  Temp:   98.1 F (36.7 C) 97.6 F (36.4 C)  TempSrc:   Oral Oral  SpO2: 99%  90%   Weight:      Height:        Intake/Output Summary (Last 24 hours) at 10/20/2022 1100 Last data filed at 10/20/2022 0701 Gross per 24 hour  Intake 694.27 ml  Output 0 ml  Net 694.27 ml    Filed Weights   10/13/22 1306  Weight: 67.2 kg    Examination:  General exam: NAD. Respiratory system: Slightly decreased breath sounds in the left base.  No wheezing, no crackles.  Fair air movement.  Speaking in full sentences.   Cardiovascular system: Regular rate rhythm no murmurs rubs or gallops.  No JVD.  No lower extremity edema.  Gastrointestinal system: Abdomen is soft, less distended, decreased tenderness to palpation diffusely, positive bowel sounds.  No rebound.  No guarding.  Central nervous system: Alert and oriented. No focal neurological deficits. Extremities: Symmetric 5 x 5 power. Skin: No rashes, lesions or ulcers Psychiatry: Judgement and insight appear normal. Mood & affect appropriate.     Data Reviewed: I have personally reviewed following labs and imaging studies  CBC: Recent Labs  Lab 10/16/22 0428 10/17/22 0340 10/18/22 0400 10/19/22 0330 10/20/22 0240  WBC 6.5 7.2 7.3 7.4 8.2  NEUTROABS  --   --   --   --  7.0  HGB 13.6 13.1 13.0 12.0* 12.2*  HCT 39.4 39.5 39.1 35.1* 35.6*  MCV 81.2 83.3 83.4 82.2 82.2  PLT 206 222 223 203 214     Basic Metabolic Panel: Recent Labs  Lab 10/14/22 0023 10/15/22 0157 10/16/22 0428 10/17/22 0340 10/17/22 1710 10/18/22 0400 10/18/22 1400 10/19/22 0330 10/20/22 0240  NA 125*   < > 122* 125* 124* 121* 123* 122* 123*  K 4.8    < > 4.4 4.9  --  5.0  --  4.3 4.8  CL 92*   < > 87* 89*  --  90*  --  86* 87*  CO2 20*   < > 21* 21*  --  22  --  24 24  GLUCOSE 124*   < > 118* 105*  --  108*  --  97 109*  BUN 22*   < > 20 23*  --  20  --  30* 30*  CREATININE 0.85   < > 0.84 0.90  --  0.79  --  0.73 0.70  CALCIUM 8.2*   < > 8.4* 8.4*  --  8.3*  --  8.5* 8.9  MG 1.8  --   --   --   --   --   --  2.1 1.8   < > = values in this interval not displayed.     GFR: Estimated Creatinine Clearance: 98 mL/min (by C-G formula based on SCr of 0.7 mg/dL).  Liver Function Tests: Recent Labs  Lab 10/17/22 0340 10/18/22 0400 10/19/22 0330  AST 26 34  35  ALT 21 26 26   ALKPHOS 176* 230* 232*  BILITOT 0.3 0.3 0.5  PROT 5.5* 5.3* 5.7*  ALBUMIN 1.9* 1.7* 2.7*     CBG: No results for input(s): "GLUCAP" in the last 168 hours.   Recent Results (from the past 240 hour(s))  SARS Coronavirus 2 by RT PCR (hospital order, performed in [facility] hospital lab) *cepheid single result test* Anterior Nasal Swab     Status: None   Collection Time: 10/13/22  8:12 AM   Specimen: Anterior Nasal Swab  Result Value Ref Range Status   SARS Coronavirus 2 by RT PCR NEGATIVE NEGATIVE Final    Comment: Performed at [facility], [address], [place], [place] [address]  Respiratory (~20 pathogens) panel by PCR     Status: None   Collection Time: 10/13/22  9:05 AM   Specimen: Anterior Nasal Swab; Respiratory  Result Value Ref Range Status   Adenovirus NOT DETECTED NOT DETECTED Final   Coronavirus 229E NOT DETECTED NOT DETECTED Final    Comment: (NOTE) The Coronavirus on the Respiratory Panel, DOES NOT test for the novel  Coronavirus (2019 nCoV)    Coronavirus HKU1 NOT DETECTED NOT DETECTED Final   Coronavirus NL63 NOT DETECTED NOT DETECTED Final   Coronavirus OC43 NOT DETECTED NOT DETECTED Final   Metapneumovirus NOT DETECTED NOT DETECTED Final   Rhinovirus / Enterovirus NOT DETECTED NOT DETECTED Final   Influenza A NOT  DETECTED NOT DETECTED Final   Influenza B NOT DETECTED NOT DETECTED Final   Parainfluenza Virus 1 NOT DETECTED NOT DETECTED Final   Parainfluenza Virus 2 NOT DETECTED NOT DETECTED Final   Parainfluenza Virus 3 NOT DETECTED NOT DETECTED Final   Parainfluenza Virus 4 NOT DETECTED NOT DETECTED Final   Respiratory Syncytial Virus NOT DETECTED NOT DETECTED Final   Bordetella pertussis NOT DETECTED NOT DETECTED Final   Bordetella Parapertussis NOT DETECTED NOT DETECTED Final   Chlamydophila pneumoniae NOT DETECTED NOT DETECTED Final   Mycoplasma pneumoniae NOT DETECTED NOT DETECTED Final    Comment: Performed at [facility], [address], [place], [place] [address]  Body fluid culture w Gram Stain     Status: None   Collection Time: 10/14/22  3:14 PM   Specimen: Abdomen; Peritoneal Fluid  Result Value Ref Range Status   Specimen Description FLUID PERITONEAL ABDOMEN  Final   Special Requests NONE  Final   Gram Stain NO WBC SEEN NO ORGANISMS SEEN   Final   Culture   Final    NO GROWTH 3 DAYS Performed at [facility], [address], [place], [place] [address]    Report Status 10/17/2022 FINAL  Final  Body fluid culture w Gram Stain     Status: None   Collection Time: 10/15/22  1:32 PM   Specimen: PATH Cytology Pleural fluid  Result Value Ref Range Status   Specimen Description PLEURAL  Final   Special Requests NONE  Final   Gram Stain   Final    RARE WBC PRESENT,BOTH PMN AND MONONUCLEAR NO ORGANISMS SEEN    Culture   Final    NO GROWTH 3 DAYS Performed at [facility], [address], [place], [place] [address]    Report Status 10/18/2022 FINAL  Final         Radiology Studies: IR Paracentesis  Result Date: 10/20/2022 INDICATION: Patient with recently diagnosed metastatic renal cancer with recurrent ascites and pleural effusion. Request for therapeutic paracentesis.  EXAM: ULTRASOUND GUIDED THERAPEUTIC PARACENTESIS MEDICATIONS: 6 mL 1% lidocaine  COMPLICATIONS: None immediate. PROCEDURE: Informed written consent was obtained from the patient after a discussion of the risks, benefits and alternatives to treatment. A timeout was performed prior to the initiation of the procedure. Initial ultrasound scanning demonstrates a small amount of ascites within the right upper abdominal quadrant. The right lower abdomen was prepped and draped in the usual sterile fashion. 1% lidocaine was used for local anesthesia. Following this, a 6 Fr Safe-T-Centesis catheter was introduced. An ultrasound image was saved for documentation purposes. The paracentesis was performed. The catheter was removed and a dressing was applied. The patient tolerated the procedure well without immediate post procedural complication. FINDINGS: A total of approximately 1.2 L of cloudy yellow (chylous appearing) fluid was removed. IMPRESSION: Successful ultrasound-guided paracentesis yielding 1.2 liters of peritoneal fluid. Performed by Lynnette Caffey, PA-C Electronically Signed   By: Irish Lack M.D.   On: 10/20/2022 09:55        Scheduled Meds:  belzutifan  120 mg Oral Daily   Chlorhexidine Gluconate Cloth  6 each Topical Daily   enoxaparin (LOVENOX) injection  40 mg Subcutaneous Q24H   feeding supplement  237 mL Oral BID BM   fluticasone furoate-vilanterol  1 puff Inhalation Daily   furosemide  20 mg Oral Daily   lidocaine  2 patch Transdermal Q24H   magnesium oxide  400 mg Oral Daily   rosuvastatin  5 mg Oral Daily   sodium chloride flush  10-40 mL Intracatheter Q12H   sodium chloride flush  3 mL Intravenous Q12H   umeclidinium bromide  1 puff Inhalation Daily   urea  15 g Oral BID   Continuous Infusions:  albumin human     cefTRIAXone (ROCEPHIN)  IV 2 g (10/19/22 2004)   magnesium sulfate bolus IVPB 2 g (10/20/22 1024)     LOS: 7 days    Time spent: 40 minutes    Ramiro Harvest, MD [facility]   To contact the attending provider between 7A-7P  or the covering provider during after hours 7P-7A, please log into the web site [email or web address] and access using universal [place] password for that web site. If you do not have the password, please call the hospital operator.  10/20/2022, 11:00 AM

## 2022-10-21 ENCOUNTER — Inpatient Hospital Stay (HOSPITAL_COMMUNITY): Payer: 59

## 2022-10-21 DIAGNOSIS — R7989 Other specified abnormal findings of blood chemistry: Secondary | ICD-10-CM | POA: Diagnosis not present

## 2022-10-21 DIAGNOSIS — R0602 Shortness of breath: Secondary | ICD-10-CM | POA: Diagnosis not present

## 2022-10-21 DIAGNOSIS — I5020 Unspecified systolic (congestive) heart failure: Secondary | ICD-10-CM | POA: Diagnosis not present

## 2022-10-21 DIAGNOSIS — E871 Hypo-osmolality and hyponatremia: Secondary | ICD-10-CM | POA: Diagnosis not present

## 2022-10-21 DIAGNOSIS — J189 Pneumonia, unspecified organism: Secondary | ICD-10-CM | POA: Diagnosis not present

## 2022-10-21 LAB — CBC
HCT: 34.8 % — ABNORMAL LOW (ref 39.0–52.0)
Hemoglobin: 11.5 g/dL — ABNORMAL LOW (ref 13.0–17.0)
MCH: 28 pg (ref 26.0–34.0)
MCHC: 33 g/dL (ref 30.0–36.0)
MCV: 84.7 fL (ref 80.0–100.0)
Platelets: 221 10*3/uL (ref 150–400)
RBC: 4.11 MIL/uL — ABNORMAL LOW (ref 4.22–5.81)
RDW: 24.4 % — ABNORMAL HIGH (ref 11.5–15.5)
WBC: 8.4 10*3/uL (ref 4.0–10.5)
nRBC: 0 % (ref 0.0–0.2)

## 2022-10-21 LAB — BASIC METABOLIC PANEL
Anion gap: 11 (ref 5–15)
BUN: 41 mg/dL — ABNORMAL HIGH (ref 6–20)
CO2: 25 mmol/L (ref 22–32)
Calcium: 8.8 mg/dL — ABNORMAL LOW (ref 8.9–10.3)
Chloride: 88 mmol/L — ABNORMAL LOW (ref 98–111)
Creatinine, Ser: 0.76 mg/dL (ref 0.61–1.24)
GFR, Estimated: >60 mL/min (ref >60)
Glucose, Bld: 104 mg/dL — ABNORMAL HIGH (ref 70–99)
Potassium: 4.6 mmol/L (ref 3.5–5.1)
Sodium: 124 mmol/L — ABNORMAL LOW (ref 135–145)

## 2022-10-21 LAB — OSMOLALITY, URINE: Osmolality, Ur: 470 mOsm/kg (ref 300–900)

## 2022-10-21 LAB — SODIUM, URINE, RANDOM: Sodium, Ur: <10 mmol/L

## 2022-10-21 LAB — MAGNESIUM: Magnesium: 2.3 mg/dL (ref 1.7–2.4)

## 2022-10-21 MED ORDER — FLUTICASONE PROPIONATE 50 MCG/ACT NA SUSP: 2.0000 | Freq: Every day | NASAL | Status: DC

## 2022-10-21 MED ORDER — PANTOPRAZOLE SODIUM 40 MG PO TBEC
40.0000 mg | DELAYED_RELEASE_TABLET | Freq: Every day | ORAL | Status: DC
  Administered 2022-10-21 – 2022-10-27 (×6): 40 mg via ORAL
  Filled 2022-10-21 (×7): qty 1

## 2022-10-21 MED ORDER — LORATADINE 10 MG PO TABS: 10.0000 mg | ORAL_TABLET | Freq: Every day | ORAL | Status: DC

## 2022-10-21 MED ORDER — BISACODYL 10 MG RE SUPP
10.0000 mg | Freq: Once | RECTAL | Status: AC
  Administered 2022-10-21: 10 mg via RECTAL
  Filled 2022-10-21: qty 1

## 2022-10-21 MED ORDER — LEVALBUTEROL HCL 0.63 MG/3ML IN NEBU: 0.6300 mg | INHALATION_SOLUTION | Freq: Once | RESPIRATORY_TRACT | Status: DC

## 2022-10-21 MED ORDER — SODIUM CHLORIDE 1 G PO TABS
2.0000 g | ORAL_TABLET | Freq: Two times a day (BID) | ORAL | Status: DC
  Administered 2022-10-21 – 2022-10-28 (×14): 2 g via ORAL
  Filled 2022-10-21 (×16): qty 2

## 2022-10-21 NOTE — Progress Notes (Signed)
PROGRESS NOTE    Jacob Reynolds  ZOX:[id number] DOB: 12-08-1965 DOA: 10/13/2022 PCP: [name], FNP    Chief Complaint  Patient presents with   Shortness of Breath    Brief Narrative:  Patient is a 57 year old male with HTN, stage IV renal cancer with metastasis to bone presented with feeling of " throat closing up".  He questioned if it was an allergic reaction as he was on several medications. He had not darted belzutifan 120 mg daily he had as he was supposed to start today. Patient just recently hospitalized 4/27-4/29 with intractable nausea, vomiting, and abdominal Per patient, since being home he had increased abdominal swelling, dry mouth.  His throat symptoms improved in the ER however still felt some discomfort.  He previously had thrush.  Also reported nausea, back pain, fatigue, shortness of breath, substernal chest discomfort during the episode. En route with EMS, patient was given epinephrine nebulizer treatment with improvement in his symptoms   In ED, noted to be tachycardia, tachypnea, O2 sats on room air. Sodium 122, CO2 19, BUN 22, creatinine 1.23 X-rays of the neck negative.  Chest x-ray showed diffuse interstitial groundglass opacities, mildly increased, representing edema superimposed on interstitial changes.  VQ scan negative for PE Patient was admitted for further workup.   Significant events 5/3: Underwent paracentesis, 3.9 L removed.  Oncology consulted 5/4: Underwent thoracentesis, left, 520 cc removed.  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  Throat closing/probable CAP -It is noted that on admission patient had presented with acute feeling of throat closing and shortness of breath. -Chest x-ray done with diffuse interstitial groundglass opacities. -V/Q done was negative for PE. -Plain films of the neck done unremarkable. -CT soft tissue neck showed no acute findings, left supraclavicular lymph node increased in size, left pleural effusion decreased. -Patient subsequently noted to have a pleural effusion and underwent thoracentesis with 520 cc of cloudy yellow chylous appearing fluid removed, which was consistent with malignant cells -Some clinical improvement. -PT/OT. -Noted to have been on doxycycline IV Rocephin and has completed a course of doxycycline. -Discontinue IV Rocephin after today's dose.  -Supportive care.  2.  Acute on chronic hyponatremia -Sodium noted at 130 on 10/10/2022 when patient recently discharged. -Sodium at 122 on admission. -Urine sodium < 10, serum osmolality 259, urine osmolality 758, concern for possible SIADH. -Patient started on chemotherapy on 10/15/2022, with Belzutifan. -Per nephrology concern for hypervolemic hyponatremia, also concern for some form of HRS physiology as well as dilutional iso-SIADH. -Sodium level at 124 this morning. -Continue Lasix 20 mg daily,urea 15 g twice daily per nephrology recommendations. -Salt tablets added per nephrology. -Patient underwent paracentesis 10/20/2022, with plan for weekly LVP's in the outpatient setting. -Status post IV albumin every 6 hours x 3 doses. -Patient started back on scheduled IV albumin every 6 hours x 5 doses. -Per nephrology.  3.  Mildly elevated troponin/Abn 2 d echo -Troponins noted to be flattened.  -2D echo with EF of 45 to 50%, left  ventricle with mildly decreased function, LV was not well-visualized but appears abnormal, left ventricular global  hypokinesis cardiac MRI may allow better view and quantification of LV systolic function.   -Patient requesting to be seen by cardiology for further evaluation and recommendations on abnormal 2D echo.   -Currently asymptomatic.  -Patient seen in consultation by cardiology, and given his comorbid disease and lack of ischemic symptoms no plan for ischemic evaluation at this time and recommended guideline directed medical therapy with somewhat limited by patient's hypotension. -Per cardiology if blood pressure improves will consider addition of metoprolol and ARB rather than amlodipine which she was previously taking. -Lasix/diuretics being guided by nephrology. -Continue Crestor. -Appreciate cardiology input and recommendations.  4.  Stage IV renal cell carcinoma with mets to the bone/malignant ascites/recurrent hypoalbuminemia/malignant pleural effusion -Patient noted to be followed by Dr. Katragadda/oncology recently completed XRT. -Recent CT with progression of disease with left pleural effusion, ascites, concern for lymphangitic spread. -Patient was supposed to have started on belzutifan on the day of admission. -Status post paracentesis 10/14/2022 with 3.9 L of fluid removed. -Status post paracentesis 10/20/2022 with 1.2 L of fluid removed. -Currently on scheduled IV albumin. -Status post left-sided thoracentesis on 5/4, 520 cc of fluid removed with cytology consistent with malignant cells. -Patient started on belzutifan per oncology recommendations during this hospitalization. -Dr.Rai discussed with oncology, Dr. Mosetta Putt, Pleurx and abdomen generally not recommended per IR due to risk of infection. -Dr.Rai also discussed with patient's primary oncologist, Dr. Ellin Saba on 5/ 6 and will arrange outpatient paracentesis at [facility] weekly. -Patient with abdominal distention again today, check ultrasound, may need another ultrasound-guided paracentesis. -Supportive care. -Consulted palliative care  for goals of care.  5.  General debility -Patient notes to be living alone with difficulty getting around. -Continue PT/OT. -Will likely need home health on discharge.  6.  QT prolongation -QTc noted at 690 on presentation. -Avoid QT prolongation medications. -Keep potassium approximately 4, magnesium approximately 2. -Repeat EKG on 10/19/2022 with resolution of QT prolongation.  7.  Hyperlipidemia -Statin.  8.  Hypertension -Norvasc was discontinued due to soft blood pressure.  -Patient currently on oral Lasix per nephrology due to acute on chronic hyponatremia.  9.  Severe protein calorie malnutrition -Albumin noted significantly low at 1.9. -On IV albumin. -Estimated BMI 20.66 kg/m. -Nutritional supplementation.   DVT prophylaxis: Lovenox Code Status: Full Family Communication: Updated patient.  No family at bedside. Disposition: Likely home with home health when clinically stable with significant improvement with hyponatremia and cleared by nephrology..  Status is: Inpatient Remains inpatient appropriate because: Severity of illness   Consultants:  Nephrology: Dr.Schertz 10/16/2022 Oncology: Dr. Mosetta Putt 10/14/2022 Cardiology: Dr. Duke Salvia 10/20/2022  Procedures:  CT soft tissue neck 10/13/2022 Chest x-ray 10/13/2022, 10/15/2022 Plain films neck 10/13/2022 VQ scan 10/13/2022 2D echo 10/17/2022 Successful right Ig power injectable Port-A-Cath placement per IR: Dr. Deanne Coffer 10/15/2021 Ultrasound-guided therapeutic and diagnostic left-sided paracentesis per IR: Dr. Lowella Dandy 10/14/2022--- 3.9 L of cloudy yellow fluid removed Left-sided guided ultrasound-guided thoracentesis per IR--Dr. Grace Isaac 10/15/2022 with 520 cc of cloudy yellow, chylous appearing fluid. Therapeutic ultrasound-guided paracentesis 1.2 L of cloudy yellow fluid removed per IR: Lynnette Caffey, PA 10/20/2022   Antimicrobials:  Anti-infectives (From admission, onward)    Start     Dose/Rate Route Frequency Ordered Stop   10/14/22  2200  doxycycline (VIBRA-TABS) tablet 100 mg        100 mg Oral Every 12 hours 10/14/22 1140 10/18/22 0845   10/13/22 2200  doxycycline (VIBRAMYCIN) 100 mg in sodium chloride 0.9 % 250 mL IVPB  Status:  Discontinued        100 mg 125 mL/hr over 120 Minutes Intravenous Every 12 hours 10/13/22 2050 10/14/22 1140   10/13/22 2100  cefTRIAXone (ROCEPHIN) 2 g in sodium chloride 0.9 % 100 mL IVPB        2 g 200 mL/hr over 30 Minutes Intravenous Every 24 hours 10/13/22 2048           Subjective: Sitting up in recliner.  Feels abdomen is distended again.  No chest pain.  No significant shortness of breath.  Trying to drink urea.  Stated he saw cardiology and more conservative approach at this time due to metastatic cancer.  Objective: Vitals:   10/20/22 2325 10/21/22 0440 10/21/22 0709 10/21/22 0825  BP: 118/86 101/78 110/84   Pulse: 94 95 95   Resp: 20 20 (!) 22   Temp:  97.6 F (36.4 C) 98 F (36.7 C)   TempSrc:  Oral Oral   SpO2:  92% (!) 89% 94%  Weight:      Height:        Intake/Output Summary (Last 24 hours) at 10/21/2022 1122 Last data filed at 10/21/2022 0300 Gross per 24 hour  Intake 516.55 ml  Output --  Net 516.55 ml    Filed Weights   10/13/22 1306  Weight: 67.2 kg    Examination:  General exam: NAD. Respiratory system: Lungs clear to auscultation bilaterally.  No wheezes, no crackles.  Fair air movement.  Speaking in full sentences.  Cardiovascular system: RRR no murmurs rubs or gallops.  No JVD.  No significant pitting lower extremity edema.  Gastrointestinal system: Abdomen soft, distended, some tightness, positive bowel sounds.  No rebound.  No guarding.  Central nervous system: Alert and oriented. No focal neurological deficits. Extremities: Symmetric 5 x 5 power. Skin: No rashes, lesions or ulcers Psychiatry: Judgement and insight appear normal. Mood & affect appropriate.     Data Reviewed: I have personally reviewed following labs and imaging  studies  CBC: Recent Labs  Lab 10/17/22 0340 10/18/22 0400 10/19/22 0330 10/20/22 0240 10/21/22 0010  WBC 7.2 7.3 7.4 8.2 8.4  NEUTROABS  --   --   --  7.0  --   HGB 13.1 13.0 12.0* 12.2* 11.5*  HCT 39.5 39.1 35.1* 35.6* 34.8*  MCV 83.3 83.4 82.2 82.2 84.7  PLT 222 223 203 214 221     Basic Metabolic Panel: Recent Labs  Lab 10/17/22 0340 10/17/22 1710 10/18/22 0400 10/18/22 1400 10/19/22 0330 10/20/22 0240 10/21/22 0010  NA 125*   < > 121* 123* 122* 123* 124*  K 4.9  --  5.0  --  4.3 4.8 4.6  CL 89*  --  90*  --  86* 87* 88*  CO2 21*  --  22  --  24 24 25   GLUCOSE 105*  --  108*  --  97 109* 104*  BUN 23*  --  20  --  30* 30* 41*  CREATININE 0.90  --  0.79  --  0.73 0.70 0.76  CALCIUM 8.4*  --  8.3*  --  8.5* 8.9 8.8*  MG  --   --   --   --  2.1 1.8 2.3   < > = values in this interval not displayed.     GFR: Estimated Creatinine Clearance: 98 mL/min (by C-G formula based on SCr of 0.76 mg/dL).  Liver Function Tests:  Recent Labs  Lab 10/17/22 0340 10/18/22 0400 10/19/22 0330  AST 26 34 35  ALT 21 26 26   ALKPHOS 176* 230* 232*  BILITOT 0.3 0.3 0.5  PROT 5.5* 5.3* 5.7*  ALBUMIN 1.9* 1.7* 2.7*     CBG: No results for input(s): "GLUCAP" in the last 168 hours.   Recent Results (from the past 240 hour(s))  SARS Coronavirus 2 by RT PCR (hospital order, performed in [facility] hospital lab) *cepheid single result test* Anterior Nasal Swab     Status: None   Collection Time: 10/13/22  8:12 AM   Specimen: Anterior Nasal Swab  Result Value Ref Range Status   SARS Coronavirus 2 by RT PCR NEGATIVE NEGATIVE Final    Comment: Performed at [facility], [address] [place], [place] [address]  Respiratory (~20 pathogens) panel by PCR     Status: None   Collection Time: 10/13/22  9:05 AM   Specimen: Anterior Nasal Swab; Respiratory  Result Value Ref Range Status   Adenovirus NOT DETECTED NOT DETECTED Final   Coronavirus 229E NOT DETECTED NOT  DETECTED Final    Comment: (NOTE) The Coronavirus on the Respiratory Panel, DOES NOT test for the novel  Coronavirus (2019 nCoV)    Coronavirus HKU1 NOT DETECTED NOT DETECTED Final   Coronavirus NL63 NOT DETECTED NOT DETECTED Final   Coronavirus OC43 NOT DETECTED NOT DETECTED Final   Metapneumovirus NOT DETECTED NOT DETECTED Final   Rhinovirus / Enterovirus NOT DETECTED NOT DETECTED Final   Influenza A NOT DETECTED NOT DETECTED Final   Influenza B NOT DETECTED NOT DETECTED Final   Parainfluenza Virus 1 NOT DETECTED NOT DETECTED Final   Parainfluenza Virus 2 NOT DETECTED NOT DETECTED Final   Parainfluenza Virus 3 NOT DETECTED NOT DETECTED Final   Parainfluenza Virus 4 NOT DETECTED NOT DETECTED Final   Respiratory Syncytial Virus NOT DETECTED NOT DETECTED Final   Bordetella pertussis NOT DETECTED NOT DETECTED Final   Bordetella Parapertussis NOT DETECTED NOT DETECTED Final   Chlamydophila pneumoniae NOT DETECTED NOT DETECTED Final   Mycoplasma pneumoniae NOT DETECTED NOT DETECTED Final    Comment: Performed at [facility], [address], [place], [place] [address]  Body fluid culture w Gram Stain     Status: None   Collection Time: 10/14/22  3:14 PM   Specimen: Abdomen; Peritoneal Fluid  Result Value Ref Range Status   Specimen Description FLUID PERITONEAL ABDOMEN  Final   Special Requests NONE  Final   Gram Stain NO WBC SEEN NO ORGANISMS SEEN   Final   Culture   Final    NO GROWTH 3 DAYS Performed at [facility], [address], [place], [place] [address]    Report Status 10/17/2022 FINAL  Final  Body fluid culture w Gram Stain     Status: None   Collection Time: 10/15/22  1:32 PM   Specimen: PATH Cytology Pleural fluid  Result Value Ref Range Status   Specimen Description PLEURAL  Final   Special Requests NONE  Final   Gram Stain   Final    RARE WBC PRESENT,BOTH PMN AND MONONUCLEAR NO ORGANISMS SEEN    Culture   Final    NO GROWTH 3 DAYS Performed at  [facility] Lab, [address] [place], [place] [address]    Report Status 10/18/2022 FINAL  Final         Radiology Studies: IR Paracentesis  Result Date: 10/20/2022 INDICATION: Patient with  recently diagnosed metastatic renal cancer with recurrent ascites and pleural effusion. Request for therapeutic paracentesis. EXAM: ULTRASOUND GUIDED THERAPEUTIC PARACENTESIS MEDICATIONS: 6 mL 1% lidocaine COMPLICATIONS: None immediate. PROCEDURE: Informed written consent was obtained from the patient after a discussion of the risks, benefits and alternatives to treatment. A timeout was performed prior to the initiation of the procedure. Initial ultrasound scanning demonstrates a small amount of ascites within the right upper abdominal quadrant. The right lower abdomen was prepped and draped in the usual sterile fashion. 1% lidocaine was used for local anesthesia. Following this, a 6 Fr Safe-T-Centesis catheter was introduced. An ultrasound image was saved for documentation purposes. The paracentesis was performed. The catheter was removed and a dressing was applied. The patient tolerated the procedure well without immediate post procedural complication. FINDINGS: A total of approximately 1.2 L of cloudy yellow (chylous appearing) fluid was removed. IMPRESSION: Successful ultrasound-guided paracentesis yielding 1.2 liters of peritoneal fluid. Performed by Lynnette Caffey, PA-C Electronically Signed   By: Irish Lack M.D.   On: 10/20/2022 09:55        Scheduled Meds:  belzutifan  120 mg Oral Daily   Chlorhexidine Gluconate Cloth  6 each Topical Daily   enoxaparin (LOVENOX) injection  40 mg Subcutaneous Q24H   feeding supplement  237 mL Oral BID BM   fluticasone furoate-vilanterol  1 puff Inhalation Daily   furosemide  20 mg Oral BID   lidocaine  2 patch Transdermal Q24H   magnesium oxide  400 mg Oral Daily   rosuvastatin  5 mg Oral Daily   sodium chloride flush  10-40 mL Intracatheter Q12H    sodium chloride flush  3 mL Intravenous Q12H   sodium chloride  2 g Oral BID WC   umeclidinium bromide  1 puff Inhalation Daily   urea  15 g Oral BID   Continuous Infusions:  albumin human 25 g (10/21/22 0821)   cefTRIAXone (ROCEPHIN)  IV 2 g (10/20/22 2147)     LOS: 8 days    Time spent: 40 minutes    Ramiro Harvest, MD [facility]   To contact the attending provider between 7A-7P or the covering provider during after hours 7P-7A, please log into the web site [email or web address] and access using universal [place] password for that web site. If you do not have the password, please call the hospital operator.  10/21/2022, 11:22 AM

## 2022-10-21 NOTE — Progress Notes (Signed)
Patient has been complaining pain and soar on the chest port . On assessment, positive blood return, no redness noted.  IV team consulted for further evaluation.  Dr Julian Reil made aware. See order. Plan of care ongoing.

## 2022-10-21 NOTE — Progress Notes (Signed)
Patient presents for therapeutic  paracentesis. [place] limited abd shows small amount of peritoneal  fluid noted. Patient has elected to defer the procedure until more as accumulated. Procedure not performed.

## 2022-10-21 NOTE — Progress Notes (Signed)
Rounding Note    Patient Name: Jacob Reynolds Date of Encounter: 10/21/2022  [facility] Cardiologist: None   Subjective   Chronic pain.  He notes mild shortness of breath that is at his baseline.   Inpatient Medications    Scheduled Meds:  belzutifan  120 mg Oral Daily   Chlorhexidine Gluconate Cloth  6 each Topical Daily   enoxaparin (LOVENOX) injection  40 mg Subcutaneous Q24H   feeding supplement  237 mL Oral BID BM   fluticasone furoate-vilanterol  1 puff Inhalation Daily   furosemide  20 mg Oral BID   lidocaine  2 patch Transdermal Q24H   magnesium oxide  400 mg Oral Daily   rosuvastatin  5 mg Oral Daily   sodium chloride flush  10-40 mL Intracatheter Q12H   sodium chloride flush  3 mL Intravenous Q12H   sodium chloride  2 g Oral BID WC   umeclidinium bromide  1 puff Inhalation Daily   urea  15 g Oral BID   Continuous Infusions:  albumin human 25 g (10/21/22 0821)   cefTRIAXone (ROCEPHIN)  IV 2 g (10/20/22 2147)   PRN Meds: acetaminophen **OR** acetaminophen, albuterol, EPINEPHrine, HYDROmorphone, lidocaine-prilocaine, loperamide, sodium chloride flush, trimethobenzamide   Vital Signs    Vitals:   10/20/22 1930 10/20/22 2325 10/21/22 0440 10/21/22 0709  BP: 112/87 118/86 101/78 110/84  Pulse: 93 94 95 95  Resp: (!) 24 20 20  (!) 22  Temp: 97.9 F (36.6 C)  97.6 F (36.4 C) 98 F (36.7 C)  TempSrc: Oral  Oral Oral  SpO2:   92% (!) 89%  Weight:      Height:        Intake/Output Summary (Last 24 hours) at 10/21/2022 0827 Last data filed at 10/21/2022 0300 Gross per 24 hour  Intake 516.55 ml  Output --  Net 516.55 ml      10/13/2022    1:06 PM 10/11/2022   11:04 AM 10/09/2022    4:23 AM  Last 3 Weights  Weight (lbs) 148 lb 2.4 oz 146 lb 3.2 oz 160 lb 4.4 oz  Weight (kg) 67.2 kg 66.316 kg 72.7 kg      Telemetry    Sinus rhythm.  Short run of SVT - Personally Reviewed  ECG    N/a - Personally Reviewed  Physical Exam   VS:  BP  110/84 (BP Location: Left Arm)   Pulse 95   Temp 98 F (36.7 C) (Oral)   Resp (!) 22   Ht 5\' 11"  (1.803 m)   Wt 67.2 kg   SpO2 94%   BMI 20.66 kg/m  , BMI Body mass index is 20.66 kg/m. GENERAL: Ill-appearing.  Cachectic  HEENT: Pupils equal round and reactive, fundi not visualized, oral mucosa unremarkable NECK:  No jugular venous distention, waveform within normal limits, carotid upstroke brisk and symmetric, no bruits, no thyromegaly LUNGS:  Clear to auscultation bilaterally HEART:  RRR.  PMI not displaced or sustained,S1 and S2 within normal limits, no S3, no S4, no clicks, no rubs, no murmurs ABD:  Flat, positive bowel sounds normal in frequency in pitch, no bruits, no rebound, no guarding, no midline pulsatile mass, no hepatomegaly, no splenomegaly EXT:  2 plus pulses throughout, no edema, no cyanosis no clubbing SKIN:  No rashes no nodules NEURO:  Cranial nerves II through XII grossly intact, motor grossly intact throughout PSYCH:  Cognitively intact, oriented to person place and time   Labs    High  Sensitivity Troponin:   Recent Labs  Lab 10/13/22 0310 10/13/22 0530  TROPONINIHS 24* 24*     Chemistry Recent Labs  Lab 10/17/22 0340 10/17/22 1710 10/18/22 0400 10/18/22 1400 10/19/22 0330 10/20/22 0240 10/21/22 0010  NA 125*   < > 121*   < > 122* 123* 124*  K 4.9  --  5.0  --  4.3 4.8 4.6  CL 89*  --  90*  --  86* 87* 88*  CO2 21*  --  22  --  24 24 25   GLUCOSE 105*  --  108*  --  97 109* 104*  BUN 23*  --  20  --  30* 30* 41*  CREATININE 0.90  --  0.79  --  0.73 0.70 0.76  CALCIUM 8.4*  --  8.3*  --  8.5* 8.9 8.8*  MG  --   --   --   --  2.1 1.8 2.3  PROT 5.5*  --  5.3*  --  5.7*  --   --   ALBUMIN 1.9*  --  1.7*  --  2.7*  --   --   AST 26  --  34  --  35  --   --   ALT 21  --  26  --  26  --   --   ALKPHOS 176*  --  230*  --  232*  --   --   BILITOT 0.3  --  0.3  --  0.5  --   --   GFRNONAA >60  --  >60  --  >60 >60 >60  ANIONGAP 15  --  9  --  12 12  11    < > = values in this interval not displayed.    Lipids No results for input(s): "CHOL", "TRIG", "HDL", "LABVLDL", "LDLCALC", "CHOLHDL" in the last 168 hours.  Hematology Recent Labs  Lab 10/19/22 0330 10/20/22 0240 10/21/22 0010  WBC 7.4 8.2 8.4  RBC 4.27 4.33 4.11*  HGB 12.0* 12.2* 11.5*  HCT 35.1* 35.6* 34.8*  MCV 82.2 82.2 84.7  MCH 28.1 28.2 28.0  MCHC 34.2 34.3 33.0  RDW 24.5* 24.5* 24.4*  PLT 203 214 221   Thyroid No results for input(s): "TSH", "FREET4" in the last 168 hours.  BNPNo results for input(s): "BNP", "PROBNP" in the last 168 hours.  DDimer No results for input(s): "DDIMER" in the last 168 hours.   Radiology    IR Paracentesis  Result Date: 10/20/2022 INDICATION: Patient with recently diagnosed metastatic renal cancer with recurrent ascites and pleural effusion. Request for therapeutic paracentesis. EXAM: ULTRASOUND GUIDED THERAPEUTIC PARACENTESIS MEDICATIONS: 6 mL 1% lidocaine COMPLICATIONS: None immediate. PROCEDURE: Informed written consent was obtained from the patient after a discussion of the risks, benefits and alternatives to treatment. A timeout was performed prior to the initiation of the procedure. Initial ultrasound scanning demonstrates a small amount of ascites within the right upper abdominal quadrant. The right lower abdomen was prepped and draped in the usual sterile fashion. 1% lidocaine was used for local anesthesia. Following this, a 6 Fr Safe-T-Centesis catheter was introduced. An ultrasound image was saved for documentation purposes. The paracentesis was performed. The catheter was removed and a dressing was applied. The patient tolerated the procedure well without immediate post procedural complication. FINDINGS: A total of approximately 1.2 L of cloudy yellow (chylous appearing) fluid was removed. IMPRESSION: Successful ultrasound-guided paracentesis yielding 1.2 liters of peritoneal fluid. Performed by Lynnette Caffey, PA-C Electronically  Signed  By: Irish Lack M.D.   On: 10/20/2022 09:55    Cardiac Studies   Echo 10/17/22:   IMPRESSIONS    1. Left ventricular ejection fraction, by estimation, is 45 to 50%. The  LV was not well-visualized but does appear abnormal. The left ventricle  has mildly decreased function. The left ventricle demonstrates global  hypokinesis. Cardiac MRI may allow a  better view and quantification of LV systolic function. Left ventricular  diastolic parameters are consistent with Grade I diastolic dysfunction  (impaired relaxation).   2. Right ventricular systolic function is normal. The right ventricular  size is normal. Tricuspid regurgitation signal is inadequate for assessing  PA pressure.   3. The mitral valve is normal in structure. No evidence of mitral valve  regurgitation. No evidence of mitral stenosis.   4. The aortic valve is tricuspid. Aortic valve regurgitation is not  visualized. No aortic stenosis is present.   5. Aortic dilatation noted. There is mild dilatation of the aortic root,  measuring 38 mm.   6. The inferior vena cava is normal in size with greater than 50%  respiratory variability, suggesting right atrial pressure of 3 mmHg.   Patient Profile     Mr. Freyre is a 51M with stage IV renal cancer admitted with concern for allergic reaction. Cardiology consulted for abnormal echo.   Assessment & Plan    # HFrEF: Chronicity unclear.  He has shortness of breath, pleural effusions and ascites requiring thoracentesis and paracentesis.   However, this is due to his underlying malignancy rather than heart failure.  Right atrial pressure was only 3 on his echo.  Given his comorbid disease and lack of ischemic symptoms, no plan for an ischemic evaluation at this time.  He is in agreement.  Recommend guideline directed medical therapy.  This is limited by hypotension.  On review of his prior chemotherapy, none seem to be associated with heart failure.  If BP improves, would add  metoprolol and ARB rather than the amlodipine he was previously taking.  Furosemide guided by nephrology.   # Hyperlipidemia: Continue rosuvastatin.  # Hyponatremia:  Persistent hyponatremia.  Intravascularly seems volume depleted.  RA pressure 3 mmHg on echo.  Nephrology following and managing.       For questions or updates, please contact [facility] Please consult [email or web address] for contact info under        Signed, Chilton Si, MD  10/21/2022, 8:27 AM

## 2022-10-21 NOTE — Progress Notes (Signed)
Jacob Reynolds   DOB:02/03/1966   ZO#:[id number]   JWJ#:[id number]  Hem/Onc   Subjective: Pt has been tolerating Belzutifan well, no nausea or other noticeable side effect.  His abdomen has been very distended, he underwent repeated paracentesis yesterday with 1.2 L fluid removed.  He still feels quite bloated, repeat ultrasound today showed a limited ascites.  He overall feels slightly better, was able to sit in the chair for a while today, and walked in the hallway.  His sodium still low, he has been seen by a nephrologist.   Objective:  Vitals:   10/21/22 2010 10/21/22 2306  BP: 110/81 117/85  Pulse: 97 (!) 103  Resp: 20 16  Temp: 97.8 F (36.6 C) 97.7 F (36.5 C)  SpO2: 91% 91%    Body mass index is 20.66 kg/m.  Intake/Output Summary (Last 24 hours) at 10/21/2022 2355 Last data filed at 10/21/2022 2026 Gross per 24 hour  Intake 1070.13 ml  Output --  Net 1070.13 ml     Sclerae unicteric, cachectic appearing  Oropharynx clear  No peripheral adenopathy  Lungs clear, decreased breath sounds on left side  Heart regular rate and rhythm  Abdomen soft,  significantly distended  MSK no focal spinal tenderness, no peripheral edema  Neuro nonfocal    CBG (last 3)  No results for input(s): "GLUCAP" in the last 72 hours.   Labs:  Urine Studies No results for input(s): "UHGB", "CRYS" in the last 72 hours.  Invalid input(s): "UACOL", "UAPR", "USPG", "UPH", "UTP", "UGL", "UKET", "UBIL", "UNIT", "UROB", "ULEU", "UEPI", "UWBC", "URBC", "UBAC", "CAST", "UCOM", "BILUA"  Basic Metabolic Panel: Recent Labs  Lab 10/17/22 0340 10/17/22 1710 10/18/22 0400 10/18/22 1400 10/19/22 0330 10/20/22 0240 10/21/22 0010  NA 125*   < > 121* 123* 122* 123* 124*  K 4.9  --  5.0  --  4.3 4.8 4.6  CL 89*  --  90*  --  86* 87* 88*  CO2 21*  --  22  --  24 24 25   GLUCOSE 105*  --  108*  --  97 109* 104*  BUN 23*  --  20  --  30* 30* 41*  CREATININE 0.90  --  0.79  --  0.73 0.70 0.76  CALCIUM  8.4*  --  8.3*  --  8.5* 8.9 8.8*  MG  --   --   --   --  2.1 1.8 2.3   < > = values in this interval not displayed.   GFR Estimated Creatinine Clearance: 98 mL/min (by C-G formula based on SCr of 0.76 mg/dL). Liver Function Tests: Recent Labs  Lab 10/17/22 0340 10/18/22 0400 10/19/22 0330  AST 26 34 35  ALT 21 26 26   ALKPHOS 176* 230* 232*  BILITOT 0.3 0.3 0.5  PROT 5.5* 5.3* 5.7*  ALBUMIN 1.9* 1.7* 2.7*   No results for input(s): "LIPASE", "AMYLASE" in the last 168 hours.  No results for input(s): "AMMONIA" in the last 168 hours. Coagulation profile No results for input(s): "INR", "PROTIME" in the last 168 hours.   CBC: Recent Labs  Lab 10/17/22 0340 10/18/22 0400 10/19/22 0330 10/20/22 0240 10/21/22 0010  WBC 7.2 7.3 7.4 8.2 8.4  NEUTROABS  --   --   --  7.0  --   HGB 13.1 13.0 12.0* 12.2* 11.5*  HCT 39.5 39.1 35.1* 35.6* 34.8*  MCV 83.3 83.4 82.2 82.2 84.7  PLT 222 223 203 214 221   Cardiac Enzymes: No results for input(s): "  CKTOTAL", "CKMB", "CKMBINDEX", "TROPONINI" in the last 168 hours. BNP: Invalid input(s): "POCBNP" CBG: No results for input(s): "GLUCAP" in the last 168 hours. D-Dimer No results for input(s): "DDIMER" in the last 72 hours. Hgb A1c No results for input(s): "HGBA1C" in the last 72 hours. Lipid Profile No results for input(s): "CHOL", "HDL", "LDLCALC", "TRIG", "CHOLHDL", "LDLDIRECT" in the last 72 hours. Thyroid function studies No results for input(s): "TSH", "T4TOTAL", "T3FREE", "THYROIDAB" in the last 72 hours.  Invalid input(s): "FREET3" Anemia work up No results for input(s): "VITAMINB12", "FOLATE", "FERRITIN", "TIBC", "IRON", "RETICCTPCT" in the last 72 hours. Microbiology Recent Results (from the past 240 hour(s))  SARS Coronavirus 2 by RT PCR (hospital order, performed in [facility] hospital lab) *cepheid single result test* Anterior Nasal Swab     Status: None   Collection Time: 10/13/22  8:12 AM   Specimen: Anterior  Nasal Swab  Result Value Ref Range Status   SARS Coronavirus 2 by RT PCR NEGATIVE NEGATIVE Final    Comment: Performed at [facility], [address] [place], [place] [address]  Respiratory (~20 pathogens) panel by PCR     Status: None   Collection Time: 10/13/22  9:05 AM   Specimen: Anterior Nasal Swab; Respiratory  Result Value Ref Range Status   Adenovirus NOT DETECTED NOT DETECTED Final   Coronavirus 229E NOT DETECTED NOT DETECTED Final    Comment: (NOTE) The Coronavirus on the Respiratory Panel, DOES NOT test for the novel  Coronavirus (2019 nCoV)    Coronavirus HKU1 NOT DETECTED NOT DETECTED Final   Coronavirus NL63 NOT DETECTED NOT DETECTED Final   Coronavirus OC43 NOT DETECTED NOT DETECTED Final   Metapneumovirus NOT DETECTED NOT DETECTED Final   Rhinovirus / Enterovirus NOT DETECTED NOT DETECTED Final   Influenza A NOT DETECTED NOT DETECTED Final   Influenza B NOT DETECTED NOT DETECTED Final   Parainfluenza Virus 1 NOT DETECTED NOT DETECTED Final   Parainfluenza Virus 2 NOT DETECTED NOT DETECTED Final   Parainfluenza Virus 3 NOT DETECTED NOT DETECTED Final   Parainfluenza Virus 4 NOT DETECTED NOT DETECTED Final   Respiratory Syncytial Virus NOT DETECTED NOT DETECTED Final   Bordetella pertussis NOT DETECTED NOT DETECTED Final   Bordetella Parapertussis NOT DETECTED NOT DETECTED Final   Chlamydophila pneumoniae NOT DETECTED NOT DETECTED Final   Mycoplasma pneumoniae NOT DETECTED NOT DETECTED Final    Comment: Performed at [facility], [address] [place], [place] [address]  Body fluid culture w Gram Stain     Status: None   Collection Time: 10/14/22  3:14 PM   Specimen: Abdomen; Peritoneal Fluid  Result Value Ref Range Status   Specimen Description FLUID PERITONEAL ABDOMEN  Final   Special Requests NONE  Final   Gram Stain NO WBC SEEN NO ORGANISMS SEEN   Final   Culture   Final    NO GROWTH 3 DAYS Performed at [facility], [address] [place], [place] [address]    Report Status 10/17/2022 FINAL  Final  Body fluid culture w Gram Stain     Status: None   Collection Time: 10/15/22  1:32 PM   Specimen: PATH Cytology Pleural fluid  Result Value Ref Range Status   Specimen Description PLEURAL  Final   Special Requests NONE  Final   Gram Stain   Final    RARE WBC PRESENT,BOTH PMN AND MONONUCLEAR NO ORGANISMS SEEN    Culture   Final  NO GROWTH 3 DAYS Performed at [facility], [address], [place], [place] [address]    Report Status 10/18/2022 FINAL  Final      Studies:  IR ABDOMEN US LIMITED  Result Date: 10/21/2022 CLINICAL DATA:  Ascites EXAM: LIMITED ABDOMEN ULTRASOUND FOR ASCITES TECHNIQUE: Limited ultrasound survey for ascites was performed in all four abdominal quadrants. COMPARISON:  None Available. FINDINGS: Focused sonographic exam of the abdomen demonstrates small volume ascites. The patient declined paracentesis. No intervention performed. IMPRESSION: Small volume ascites. The patient declined paracentesis at this time. Electronically Signed   By: Olive Bass M.D.   On: 10/21/2022 16:17   [place] ASCITES (ABDOMEN LIMITED)  Result Date: 10/21/2022 CLINICAL DATA:  Ascites EXAM: LIMITED ABDOMEN ULTRASOUND FOR ASCITES TECHNIQUE: Limited ultrasound survey for ascites was performed in all four abdominal quadrants. COMPARISON:  10/20/2022 FINDINGS: Sonographic evaluation of the abdomen demonstrates mild right abdominal ascites. Trace left abdominal ascites is seen. IMPRESSION: Mild right abdominal ascites. Electronically Signed   By: Acquanetta Belling M.D.   On: 10/21/2022 15:10   IR Paracentesis  Result Date: 10/20/2022 INDICATION: Patient with recently diagnosed metastatic renal cancer with recurrent ascites and pleural effusion. Request for therapeutic paracentesis. EXAM: ULTRASOUND GUIDED THERAPEUTIC PARACENTESIS MEDICATIONS: 6 mL 1% lidocaine COMPLICATIONS: None immediate. PROCEDURE: Informed written consent  was obtained from the patient after a discussion of the risks, benefits and alternatives to treatment. A timeout was performed prior to the initiation of the procedure. Initial ultrasound scanning demonstrates a small amount of ascites within the right upper abdominal quadrant. The right lower abdomen was prepped and draped in the usual sterile fashion. 1% lidocaine was used for local anesthesia. Following this, a 6 Fr Safe-T-Centesis catheter was introduced. An ultrasound image was saved for documentation purposes. The paracentesis was performed. The catheter was removed and a dressing was applied. The patient tolerated the procedure well without immediate post procedural complication. FINDINGS: A total of approximately 1.2 L of cloudy yellow (chylous appearing) fluid was removed. IMPRESSION: Successful ultrasound-guided paracentesis yielding 1.2 liters of peritoneal fluid. Performed by Lynnette Caffey, PA-C Electronically Signed   By: Irish Lack M.D.   On: 10/20/2022 09:55    Assessment: 57 y.o. 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 -continue PT. -He is probably going home, will need home care.  I recommend palliative home services, due to his distended abdomen, he is not able to lay flat, he would need a hospital bed.  -I discussed CODE STATUS with him today, he understands his prognosis is poor, but still wishes to be full code. -He will follow-up with Dr. Ellin Saba after discharge   Malachy Mood, MD 10/21/2022

## 2022-10-21 NOTE — Progress Notes (Addendum)
Nephrology Follow-Up Consult note   Assessment/Recommendations: Jacob Reynolds is a/an 57 y.o. male with a past medical history significant for stage IV renal cancer admitted for hyponatremia.       Hypervolemic now euvolemic hyponatremia: In the setting of volume overload with low urine sodium. Asymptomatic currently. Likely some form of HRS physiology contributing as well as dilution. Volume status overall better now with persistent low Na. Will recheck urine lytes but for now goal with be ongoing osmolyte load  -Continue urea 15g BID; patient does not like the taste so will add 2g Na tabs BID. This will help increase Na further and may be more palatable -Continue laisx 20mg  BID; less so for volume overload and more as adjunctive treatment of hyponatremia -Repeat urine lytes; may not be accurate given lasix -Could consider tolvaptan, particularly if urine sodium increases which would suggest ADH is playing some role -Has plan for weekly LVPs outpatient -Continue to monitor sodium regularly -Sodium is stable and patient is not symptomatic.   Recurrent ascites: Status post paracentesis on 5/3 and 5/9.  Diuretics as above  Metastatic cancer: Renal cell.  Management per oncology  Hypertension: Given low blood pressures we stopped amlodipine  HFrEF: cardiology following.  Evidence-based medicine limited by hypotension.  Volume status acceptable    Recommendations conveyed to primary service.    [name] [facility] 10/21/2022 9:59 AM  ___________________________________________________________  CC: ascites, hyponatremia  Interval History/Subjective: Patient continues to have pain also with some numbness on left lateral thigh. LVP yesterday but only 1.2L removed. Na slightly better today.   Medications:  Current Facility-Administered Medications  Medication Dose Route Frequency Provider Last Rate Last Admin   acetaminophen (TYLENOL) tablet 650 mg  650 mg  Oral Q6H PRN Madelyn Flavors A, MD   325 mg at 10/20/22 1735   Or   acetaminophen (TYLENOL) suppository 650 mg  650 mg Rectal Q6H PRN Madelyn Flavors A, MD       albumin human 25 % solution 25 g  25 g Intravenous Q6H Rodolph Bong, MD 60 mL/hr at 10/21/22 0821 25 g at 10/21/22 0821   albuterol (PROVENTIL) (2.5 MG/3ML) 0.083% nebulizer solution 2.5 mg  2.5 mg Nebulization Q2H PRN Clydie Braun, MD       belzutifan [facility]) tablet 120 mg  120 mg Oral Daily Malachy Mood, MD   120 mg at 10/20/22 1513   cefTRIAXone (ROCEPHIN) 2 g in sodium chloride 0.9 % 100 mL IVPB  2 g Intravenous Q24H Katrinka Blazing, Rondell A, MD 200 mL/hr at 10/20/22 2147 2 g at 10/20/22 2147   Chlorhexidine Gluconate Cloth 2 % PADS 6 each  6 each Topical Daily Madelyn Flavors A, MD   6 each at 10/20/22 1238   enoxaparin (LOVENOX) injection 40 mg  40 mg Subcutaneous Q24H Smith, Rondell A, MD   40 mg at 10/21/22 0816   EPINEPHrine (EPI-PEN) injection 0.3 mg  0.3 mg Intramuscular Once PRN Tilden Fossa, MD       feeding supplement (ENSURE ENLIVE / ENSURE PLUS) liquid 237 mL  237 mL Oral BID BM Opyd, Lavone Neri, MD   237 mL at 10/20/22 1047   fluticasone furoate-vilanterol (BREO ELLIPTA) 200-25 MCG/ACT 1 puff  1 puff Inhalation Daily Rai, Ripudeep K, MD   1 puff at 10/21/22 0825   furosemide (LASIX) tablet 20 mg  20 mg Oral BID [name], MD   20 mg at 10/21/22 0816   HYDROmorphone (DILAUDID) tablet 2 mg  2 mg Oral Q6H PRN Madelyn Flavors A, MD   2 mg at 10/21/22 0006   lidocaine (LIDODERM) 5 % 2 patch  2 patch Transdermal Q24H Madelyn Flavors A, MD   2 patch at 10/20/22 1246   lidocaine-prilocaine (EMLA) cream   Topical PRN Hillary Bow, DO       loperamide (IMODIUM) capsule 2 mg  2 mg Oral PRN Madelyn Flavors A, MD       magnesium oxide (MAG-OX) tablet 400 mg  400 mg Oral Daily Rai, Ripudeep K, MD   400 mg at 10/20/22 1007   rosuvastatin (CRESTOR) tablet 5 mg  5 mg Oral Daily Katrinka Blazing, Rondell A, MD   5 mg at 10/20/22 1006    sodium chloride flush (NS) 0.9 % injection 10-40 mL  10-40 mL Intracatheter Q12H Smith, Rondell A, MD   10 mL at 10/20/22 2000   sodium chloride flush (NS) 0.9 % injection 10-40 mL  10-40 mL Intracatheter PRN Smith, Rondell A, MD       sodium chloride flush (NS) 0.9 % injection 3 mL  3 mL Intravenous Q12H Smith, Rondell A, MD   3 mL at 10/20/22 1046   sodium chloride tablet 2 g  2 g Oral BID WC [name], MD       trimethobenzamide Dorothey Baseman) injection 200 mg  200 mg Intramuscular Q6H PRN Smith, Rondell A, MD       umeclidinium bromide (INCRUSE ELLIPTA) 62.5 MCG/ACT 1 puff  1 puff Inhalation Daily Rai, Ripudeep K, MD   1 puff at 10/21/22 0824   urea (URE-NA) oral packet 15 g  15 g Oral BID [name], MD   15 g at 10/20/22 2020   Facility-Administered Medications Ordered in Other Encounters  Medication Dose Route Frequency Provider Last Rate Last Admin   diphenhydrAMINE (BENADRYL) 50 MG/ML injection            famotidine (PEPCID) 20-0.9 MG/50ML-% IVPB               Review of Systems: 10 systems reviewed and negative except per interval history/subjective  Physical Exam: Vitals:   10/21/22 0709 10/21/22 0825  BP: 110/84   Pulse: 95   Resp: (!) 22   Temp: 98 F (36.7 C)   SpO2: (!) 89% 94%   No intake/output data recorded.  Intake/Output Summary (Last 24 hours) at 10/21/2022 0959 Last data filed at 10/21/2022 0300 Gross per 24 hour  Intake 516.55 ml  Output --  Net 516.55 ml   Constitutional: chronically ill appearing ENMT: ears and nose without scars or lesions, MMM CV: normal rate, no edema Respiratory: bilateral chest rise, normal work of breathing Gastrointestinal: distended, nontender Skin: no visible lesions or rashes Psych: alert, judgement/insight appropriate, appropriate mood and affect   Test Results I personally reviewed new and old clinical labs and radiology tests Lab Results  Component Value Date   NA 124 (L) 10/21/2022   K 4.6 10/21/2022   CL  88 (L) 10/21/2022   CO2 25 10/21/2022   BUN 41 (H) 10/21/2022   CREATININE 0.76 10/21/2022   CALCIUM 8.8 (L) 10/21/2022   ALBUMIN 2.7 (L) 10/19/2022   PHOS 3.3 10/13/2022    CBC Recent Labs  Lab 10/19/22 0330 10/20/22 0240 10/21/22 0010  WBC 7.4 8.2 8.4  NEUTROABS  --  7.0  --   HGB 12.0* 12.2* 11.5*  HCT 35.1* 35.6* 34.8*  MCV 82.2 82.2 84.7  PLT 203 214 221

## 2022-10-22 DIAGNOSIS — J189 Pneumonia, unspecified organism: Secondary | ICD-10-CM | POA: Diagnosis not present

## 2022-10-22 DIAGNOSIS — I471 Supraventricular tachycardia, unspecified: Secondary | ICD-10-CM | POA: Diagnosis not present

## 2022-10-22 DIAGNOSIS — E785 Hyperlipidemia, unspecified: Secondary | ICD-10-CM | POA: Diagnosis not present

## 2022-10-22 DIAGNOSIS — E871 Hypo-osmolality and hyponatremia: Secondary | ICD-10-CM | POA: Diagnosis not present

## 2022-10-22 DIAGNOSIS — R0602 Shortness of breath: Secondary | ICD-10-CM | POA: Diagnosis not present

## 2022-10-22 DIAGNOSIS — R7989 Other specified abnormal findings of blood chemistry: Secondary | ICD-10-CM | POA: Diagnosis not present

## 2022-10-22 LAB — CBC
HCT: 35.7 % — ABNORMAL LOW (ref 39.0–52.0)
Hemoglobin: 11.8 g/dL — ABNORMAL LOW (ref 13.0–17.0)
MCH: 27.6 pg (ref 26.0–34.0)
MCHC: 33.1 g/dL (ref 30.0–36.0)
MCV: 83.6 fL (ref 80.0–100.0)
Platelets: 240 10*3/uL (ref 150–400)
RBC: 4.27 MIL/uL (ref 4.22–5.81)
RDW: 24.7 % — ABNORMAL HIGH (ref 11.5–15.5)
WBC: 9.5 10*3/uL (ref 4.0–10.5)
nRBC: 0 % (ref 0.0–0.2)

## 2022-10-22 LAB — BASIC METABOLIC PANEL
Anion gap: 12 (ref 5–15)
BUN: 37 mg/dL — ABNORMAL HIGH (ref 6–20)
CO2: 26 mmol/L (ref 22–32)
Calcium: 9.3 mg/dL (ref 8.9–10.3)
Chloride: 88 mmol/L — ABNORMAL LOW (ref 98–111)
Creatinine, Ser: 0.74 mg/dL (ref 0.61–1.24)
GFR, Estimated: >60 mL/min (ref >60)
Glucose, Bld: 106 mg/dL — ABNORMAL HIGH (ref 70–99)
Potassium: 4.5 mmol/L (ref 3.5–5.1)
Sodium: 126 mmol/L — ABNORMAL LOW (ref 135–145)

## 2022-10-22 LAB — MAGNESIUM: Magnesium: 2.1 mg/dL (ref 1.7–2.4)

## 2022-10-22 MED ORDER — BISACODYL 10 MG RE SUPP
10.0000 mg | Freq: Once | RECTAL | Status: DC
  Filled 2022-10-22: qty 1

## 2022-10-22 MED ORDER — SIMETHICONE 80 MG PO CHEW
160.0000 mg | CHEWABLE_TABLET | Freq: Four times a day (QID) | ORAL | Status: DC
  Administered 2022-10-22 – 2022-10-28 (×22): 160 mg via ORAL
  Filled 2022-10-22 (×27): qty 2

## 2022-10-22 MED ORDER — SORBITOL 70 % SOLN
30.0000 mL | Status: AC
  Administered 2022-10-22 (×2): 30 mL via ORAL
  Filled 2022-10-22 (×2): qty 30

## 2022-10-22 NOTE — Progress Notes (Addendum)
PROGRESS NOTE    Jacob Reynolds  ZOX:[id number] DOB: Dec 19, 1965 DOA: 10/13/2022 PCP: [name], FNP    Chief Complaint  Patient presents with   Shortness of Breath    Brief Narrative:  Patient is a 57 year old male with HTN, stage IV renal cancer with metastasis to bone presented with feeling of " throat closing up".  He questioned if it was an allergic reaction as he was on several medications. He had not darted belzutifan 120 mg daily he had as he was supposed to start today. Patient just recently hospitalized 4/27-4/29 with intractable nausea, vomiting, and abdominal Per patient, since being home he had increased abdominal swelling, dry mouth.  His throat symptoms improved in the ER however still felt some discomfort.  He previously had thrush.  Also reported nausea, back pain, fatigue, shortness of breath, substernal chest discomfort during the episode. En route with EMS, patient was given epinephrine nebulizer treatment with improvement in his symptoms   In ED, noted to be tachycardia, tachypnea, O2 sats on room air. Sodium 122, CO2 19, BUN 22, creatinine 1.23 X-rays of the neck negative.  Chest x-ray showed diffuse interstitial groundglass opacities, mildly increased, representing edema superimposed on interstitial changes.  VQ scan negative for PE Patient was admitted for further workup.   Significant events 5/3: Underwent paracentesis, 3.9 L removed.  Oncology consulted 5/4: Underwent thoracentesis, left, 520 cc removed.  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  Throat closing/probable CAP -It is noted that on admission patient had presented with acute feeling of throat closing and shortness of breath. -Chest x-ray done with diffuse interstitial groundglass opacities. -V/Q done was negative for PE. -Plain films of the neck done unremarkable. -CT soft tissue neck showed no acute findings, left supraclavicular lymph node increased in size, left pleural effusion decreased. -Patient subsequently noted to have a pleural effusion and underwent thoracentesis with 520 cc of cloudy yellow chylous appearing fluid removed, which was consistent with malignant cells -Some clinical improvement. -PT/OT. -Noted to have been on doxycycline IV Rocephin and has completed a course of doxycycline and IV Rocephin. -IV Rocephin discontinued this morning. -Supportive care.  2.  Acute on chronic hyponatremia -Sodium noted at 130 on 10/10/2022 when patient recently discharged. -Sodium at 122 on admission. -Urine sodium < 10, serum osmolality 259, urine osmolality 758, concern for possible SIADH in the setting of patient's metastatic cancer. -Patient started on chemotherapy on 10/15/2022, with Belzutifan. -Per nephrology concern for hypervolemic hyponatremia, also concern for some form of HRS physiology as well as dilutional iso-SIADH. -Sodium level at 126 this morning. -Continue Lasix 20 mg twice daily (per nephrology last saw her for volume overload and more as an adjunct treatment for hyponatremia). -Continue salt tablets. -Urea discontinued per nephrology. -Patient underwent paracentesis 10/20/2022, with plan for weekly LVP's in the outpatient setting. -Status post IV albumin every 6 hours x 3 doses. -Patient started back on scheduled IV albumin every 6 hours x 5 doses. -Per nephrology.  3.  Mildly  elevated troponin/Abn 2 d echo -Troponins noted to be flattened.  -2D echo with EF of  45 to 50%, left ventricle with mildly decreased function, LV was not well-visualized but appears abnormal, left ventricular global hypokinesis cardiac MRI may allow better view and quantification of LV systolic function.   -Patient requesting to be seen by cardiology for further evaluation and recommendations on abnormal 2D echo.   -Currently asymptomatic.  -Patient seen in consultation by cardiology, and given his comorbid disease and lack of ischemic symptoms no plan for ischemic evaluation at this time and recommended guideline directed medical therapy with somewhat limited by patient's hypotension. -Per cardiology if blood pressure improves will consider addition of metoprolol and ARB rather than amlodipine which he was previously taking. -Lasix/diuretics being guided by nephrology. -Continue Crestor. -Appreciate cardiology input and recommendations.  4.  Stage IV renal cell carcinoma with mets to the bone/malignant ascites/recurrent hypoalbuminemia/malignant pleural effusion -Patient noted to be followed by Dr. Katragadda/oncology recently completed XRT. -Recent CT with progression of disease with left pleural effusion, ascites, concern for lymphangitic spread. -Patient was supposed to have started on belzutifan on the day of admission. -Status post paracentesis 10/14/2022 with 3.9 L of fluid removed. -Status post paracentesis 10/20/2022 with 1.2 L of fluid removed. -Currently on scheduled IV albumin. -Status post left-sided thoracentesis on 5/4, 520 cc of fluid removed with cytology consistent with malignant cells. -Patient started on belzutifan per oncology recommendations during this hospitalization. -Dr.Rai discussed with oncology, Dr. Mosetta Putt, Pleurx and abdomen generally not recommended per IR due to risk of infection. -Dr.Rai also discussed with patient's primary oncologist, Dr. Ellin Saba on 5/ 6 and will arrange outpatient paracentesis at [facility] weekly. -Patient with abdominal  distention again 10/21/2022, patient seen by IR and limited abdominal ultrasound showed a small amount of peritoneal fluid noted and as such paracentesis not performed as patient elected to defer procedure until there was more accumulation.  -Supportive care. -Consulted palliative care for goals of care.  5.  General debility -Patient notes to be living alone with difficulty getting around. -Continue PT/OT. -Will likely need home health on discharge.  6.  QT prolongation -QTc noted at 690 on presentation. -Avoid QT prolongation medications. -Keep potassium approximately 4, magnesium approximately 2. -Repeat EKG on 10/19/2022 with resolution of QT prolongation.  7.  Hyperlipidemia -Continue statin.  8.  Hypertension -Norvasc was discontinued due to soft blood pressure.  -Patient currently on oral Lasix per nephrology due to acute on chronic hyponatremia.  9.  Severe protein calorie malnutrition -Albumin noted significantly low at 1.9. -On IV albumin. -Estimated BMI 20.66 kg/m. -Nutritional supplementation.   DVT prophylaxis: Lovenox Code Status: Full Family Communication: Updated patient.  No family at bedside. Disposition: Likely home with home health when clinically stable with significant improvement with hyponatremia and cleared by nephrology.Marland Kitchen  Hopefully home in the next 24 hours.  Status is: Inpatient Remains inpatient appropriate because: Severity of illness   Consultants:  Nephrology: Dr.Schertz 10/16/2022 Oncology: Dr. Mosetta Putt 10/14/2022 Cardiology: Dr. Duke Salvia 10/20/2022  Procedures:  CT soft tissue neck 10/13/2022 Chest x-ray 10/13/2022, 10/15/2022 Plain films neck 10/13/2022 VQ scan 10/13/2022 2D echo 10/17/2022 Successful right Ig power injectable Port-A-Cath placement per IR: Dr. Deanne Coffer 10/15/2021 Ultrasound-guided therapeutic and diagnostic left-sided paracentesis per IR: Dr. Lowella Dandy 10/14/2022--- 3.9 L of cloudy yellow fluid removed Left-sided guided ultrasound-guided  thoracentesis per IR--Dr. Grace Isaac 10/15/2022 with 520 cc of cloudy yellow, chylous appearing fluid. Therapeutic ultrasound-guided paracentesis 1.2 L of cloudy yellow fluid removed per IR: Carollee Herter  Rae Roam, [place] 10/20/2022 Abdominal ultrasound 10/21/2022  Antimicrobials:  Anti-infectives (From admission, onward)    Start     Dose/Rate Route Frequency Ordered Stop   10/14/22 2200  doxycycline (VIBRA-TABS) tablet 100 mg        100 mg Oral Every 12 hours 10/14/22 1140 10/18/22 0845   10/13/22 2200  doxycycline (VIBRAMYCIN) 100 mg in sodium chloride 0.9 % 250 mL IVPB  Status:  Discontinued        100 mg 125 mL/hr over 120 Minutes Intravenous Every 12 hours 10/13/22 2050 10/14/22 1140   10/13/22 2100  cefTRIAXone (ROCEPHIN) 2 g in sodium chloride 0.9 % 100 mL IVPB  Status:  Discontinued        2 g 200 mL/hr over 30 Minutes Intravenous Every 24 hours 10/13/22 2048 10/22/22 0834         Subjective: Sitting up in recliner.  No chest pain.  No significant change in chronic shortness of breath.  No significant change with abdominal pain.  Abdomen still distended.  Stated had 2 small bowel movements after Dulcolax suppository yesterday.   Objective: Vitals:   10/22/22 0308 10/22/22 0719 10/22/22 0750 10/22/22 1131  BP: (!) 117/94 118/89  109/85  Pulse:  (!) 105  (!) 104  Resp: 19 20  18   Temp: 97.6 F (36.4 C) 97.8 F (36.6 C)  98.4 F (36.9 C)  TempSrc: Oral Oral  Oral  SpO2: 90% 90% 90% (!) 88%  Weight:      Height:        Intake/Output Summary (Last 24 hours) at 10/22/2022 1332 Last data filed at 10/22/2022 1239 Gross per 24 hour  Intake 1025.13 ml  Output 225 ml  Net 800.13 ml    Filed Weights   10/13/22 1306  Weight: 67.2 kg    Examination:  General exam: NAD. Respiratory system: Some decreased breath sounds in the bases otherwise clear.  No wheezes.  No crackles.  Fair air movement.  Speaking in full sentences.  Cardiovascular system: RRR no murmurs rubs or gallops.  No JVD.   No significant pitting lower extremity edema.   Gastrointestinal system: Abdomen soft, distended, some tightness, positive bowel sounds.  No rebound.  No guarding.  Central nervous system: Alert and oriented. No focal neurological deficits. Extremities: Symmetric 5 x 5 power. Skin: No rashes, lesions or ulcers Psychiatry: Judgement and insight appear normal. Mood & affect appropriate.     Data Reviewed: I have personally reviewed following labs and imaging studies  CBC: Recent Labs  Lab 10/18/22 0400 10/19/22 0330 10/20/22 0240 10/21/22 0010 10/22/22 0326  WBC 7.3 7.4 8.2 8.4 9.5  NEUTROABS  --   --  7.0  --   --   HGB 13.0 12.0* 12.2* 11.5* 11.8*  HCT 39.1 35.1* 35.6* 34.8* 35.7*  MCV 83.4 82.2 82.2 84.7 83.6  PLT 223 203 214 221 240     Basic Metabolic Panel: Recent Labs  Lab 10/18/22 0400 10/18/22 1400 10/19/22 0330 10/20/22 0240 10/21/22 0010 10/22/22 0326  NA 121* 123* 122* 123* 124* 126*  K 5.0  --  4.3 4.8 4.6 4.5  CL 90*  --  86* 87* 88* 88*  CO2 22  --  24 24 25 26   GLUCOSE 108*  --  97 109* 104* 106*  BUN 20  --  30* 30* 41* 37*  CREATININE 0.79  --  0.73 0.70 0.76 0.74  CALCIUM 8.3*  --  8.5* 8.9 8.8* 9.3  MG  --   --  2.1 1.8 2.3 2.1     GFR: Estimated Creatinine Clearance: 98 mL/min (by C-G formula based on SCr of 0.74 mg/dL).  Liver Function Tests: Recent Labs  Lab 10/17/22 0340 10/18/22 0400 10/19/22 0330  AST 26 34 35  ALT 21 26 26   ALKPHOS 176* 230* 232*  BILITOT 0.3 0.3 0.5  PROT 5.5* 5.3* 5.7*  ALBUMIN 1.9* 1.7* 2.7*     CBG: No results for input(s): "GLUCAP" in the last 168 hours.   Recent Results (from the past 240 hour(s))  SARS Coronavirus 2 by RT PCR (hospital order, performed in [facility] hospital lab) *cepheid single result test* Anterior Nasal Swab     Status: None   Collection Time: 10/13/22  8:12 AM   Specimen: Anterior Nasal Swab  Result Value Ref Range Status   SARS Coronavirus 2 by RT PCR NEGATIVE NEGATIVE  Final    Comment: Performed at [facility], [address] [place], [place] [address]  Respiratory (~20 pathogens) panel by PCR     Status: None   Collection Time: 10/13/22  9:05 AM   Specimen: Anterior Nasal Swab; Respiratory  Result Value Ref Range Status   Adenovirus NOT DETECTED NOT DETECTED Final   Coronavirus 229E NOT DETECTED NOT DETECTED Final    Comment: (NOTE) The Coronavirus on the Respiratory Panel, DOES NOT test for the novel  Coronavirus (2019 nCoV)    Coronavirus HKU1 NOT DETECTED NOT DETECTED Final   Coronavirus NL63 NOT DETECTED NOT DETECTED Final   Coronavirus OC43 NOT DETECTED NOT DETECTED Final   Metapneumovirus NOT DETECTED NOT DETECTED Final   Rhinovirus / Enterovirus NOT DETECTED NOT DETECTED Final   Influenza A NOT DETECTED NOT DETECTED Final   Influenza B NOT DETECTED NOT DETECTED Final   Parainfluenza Virus 1 NOT DETECTED NOT DETECTED Final   Parainfluenza Virus 2 NOT DETECTED NOT DETECTED Final   Parainfluenza Virus 3 NOT DETECTED NOT DETECTED Final   Parainfluenza Virus 4 NOT DETECTED NOT DETECTED Final   Respiratory Syncytial Virus NOT DETECTED NOT DETECTED Final   Bordetella pertussis NOT DETECTED NOT DETECTED Final   Bordetella Parapertussis NOT DETECTED NOT DETECTED Final   Chlamydophila pneumoniae NOT DETECTED NOT DETECTED Final   Mycoplasma pneumoniae NOT DETECTED NOT DETECTED Final    Comment: Performed at [facility], [address] [place], [place] [address]  Body fluid culture w Gram Stain     Status: None   Collection Time: 10/14/22  3:14 PM   Specimen: Abdomen; Peritoneal Fluid  Result Value Ref Range Status   Specimen Description FLUID PERITONEAL ABDOMEN  Final   Special Requests NONE  Final   Gram Stain NO WBC SEEN NO ORGANISMS SEEN   Final   Culture   Final    NO GROWTH 3 DAYS Performed at [facility], [address], [place], [place] [address]    Report Status 10/17/2022 FINAL  Final  Body fluid culture w  Gram Stain     Status: None   Collection Time: 10/15/22  1:32 PM   Specimen: PATH Cytology Pleural fluid  Result Value Ref Range Status   Specimen Description PLEURAL  Final   Special Requests NONE  Final   Gram Stain   Final    RARE WBC PRESENT,BOTH PMN AND MONONUCLEAR NO ORGANISMS SEEN    Culture   Final    NO GROWTH 3 DAYS Performed at [facility], [address] [place], [place] [address]  Report Status 10/18/2022 FINAL  Final         Radiology Studies: IR ABDOMEN US LIMITED  Result Date: 10/21/2022 CLINICAL DATA:  Ascites EXAM: LIMITED ABDOMEN ULTRASOUND FOR ASCITES TECHNIQUE: Limited ultrasound survey for ascites was performed in all four abdominal quadrants. COMPARISON:  None Available. FINDINGS: Focused sonographic exam of the abdomen demonstrates small volume ascites. The patient declined paracentesis. No intervention performed. IMPRESSION: Small volume ascites. The patient declined paracentesis at this time. Electronically Signed   By: Olive Bass M.D.   On: 10/21/2022 16:17   [place] ASCITES (ABDOMEN LIMITED)  Result Date: 10/21/2022 CLINICAL DATA:  Ascites EXAM: LIMITED ABDOMEN ULTRASOUND FOR ASCITES TECHNIQUE: Limited ultrasound survey for ascites was performed in all four abdominal quadrants. COMPARISON:  10/20/2022 FINDINGS: Sonographic evaluation of the abdomen demonstrates mild right abdominal ascites. Trace left abdominal ascites is seen. IMPRESSION: Mild right abdominal ascites. Electronically Signed   By: Acquanetta Belling M.D.   On: 10/21/2022 15:10        Scheduled Meds:  belzutifan  120 mg Oral Daily   Chlorhexidine Gluconate Cloth  6 each Topical Daily   enoxaparin (LOVENOX) injection  40 mg Subcutaneous Q24H   feeding supplement  237 mL Oral BID BM   fluticasone furoate-vilanterol  1 puff Inhalation Daily   furosemide  20 mg Oral BID   lidocaine  2 patch Transdermal Q24H   magnesium oxide  400 mg Oral Daily   pantoprazole  40 mg Oral Q0600    rosuvastatin  5 mg Oral Daily   sodium chloride flush  10-40 mL Intracatheter Q12H   sodium chloride flush  3 mL Intravenous Q12H   sodium chloride  2 g Oral BID WC   umeclidinium bromide  1 puff Inhalation Daily   Continuous Infusions:     LOS: 9 days    Time spent: 35 minutes    Ramiro Harvest, MD [facility]   To contact the attending provider between 7A-7P or the covering provider during after hours 7P-7A, please log into the web site [email or web address] and access using universal [place] password for that web site. If you do not have the password, please call the hospital operator.  10/22/2022, 1:32 PM

## 2022-10-22 NOTE — Progress Notes (Signed)
Nephrology Follow-Up Consult note   Assessment/Recommendations: Jacob Reynolds is a/an 57 y.o. male with a past medical history significant for stage IV renal cancer admitted for hyponatremia.       Hypervolemic now euvolemic hyponatremia: In the setting of volume overload with low urine sodium. Asymptomatic currently. Likely some form of HRS physiology as well as possible heart failure contributing.  Persistently low sodium would argue in regards to these etiologies.  However, given his malignancy SIADH is possible. -Continue salt tabs 2 g twice daily.  Have to monitor closely while on this therapy given his heart failure -Continue laisx 20mg  BID; less so for volume overload and more as adjunctive treatment of hyponatremia -Has plan for weekly LVPs outpatient -Sodium has improved and the patient remains symptomatic.  Does not have to remain inpatient per my perspective  Recurrent ascites: Status post paracentesis on 5/3 and 5/9.  Diuretics as above  Metastatic cancer: Renal cell.  Management per oncology  Hypertension: Given low blood pressures we stopped amlodipine  HFrEF: cardiology following.  Evidence-based medicine limited by hypotension.  Volume status acceptable. Consider SGLT2i which may help with hyponatremia as well    Recommendations conveyed to primary service.    [name] [facility] 10/22/2022 9:26 AM  ___________________________________________________________  CC: ascites, hyponatremia  Interval History/Subjective: Patient feels about the same today.  Persistent weakness and pain but overall unchanged.   Medications:  Current Facility-Administered Medications  Medication Dose Route Frequency Provider Last Rate Last Admin   acetaminophen (TYLENOL) tablet 650 mg  650 mg Oral Q6H PRN Madelyn Flavors A, MD   325 mg at 10/21/22 1534   Or   acetaminophen (TYLENOL) suppository 650 mg  650 mg Rectal Q6H PRN Madelyn Flavors A, MD       albuterol  (PROVENTIL) (2.5 MG/3ML) 0.083% nebulizer solution 2.5 mg  2.5 mg Nebulization Q2H PRN Clydie Braun, MD       belzutifan [facility]) tablet 120 mg  120 mg Oral Daily Malachy Mood, MD   120 mg at 10/21/22 1534   Chlorhexidine Gluconate Cloth 2 % PADS 6 each  6 each Topical Daily Madelyn Flavors A, MD   6 each at 10/22/22 0908   enoxaparin (LOVENOX) injection 40 mg  40 mg Subcutaneous Q24H Smith, Rondell A, MD   40 mg at 10/21/22 0816   EPINEPHrine (EPI-PEN) injection 0.3 mg  0.3 mg Intramuscular Once PRN Tilden Fossa, MD       feeding supplement (ENSURE ENLIVE / ENSURE PLUS) liquid 237 mL  237 mL Oral BID BM Opyd, Lavone Neri, MD   237 mL at 10/21/22 1549   fluticasone furoate-vilanterol (BREO ELLIPTA) 200-25 MCG/ACT 1 puff  1 puff Inhalation Daily Rai, Ripudeep K, MD   1 puff at 10/22/22 0748   furosemide (LASIX) tablet 20 mg  20 mg Oral BID [name], MD   20 mg at 10/21/22 1712   HYDROmorphone (DILAUDID) tablet 2 mg  2 mg Oral Q6H PRN Madelyn Flavors A, MD   2 mg at 10/22/22 0225   lidocaine (LIDODERM) 5 % 2 patch  2 patch Transdermal Q24H Madelyn Flavors A, MD   2 patch at 10/21/22 1207   lidocaine-prilocaine (EMLA) cream   Topical PRN Hillary Bow, DO       loperamide (IMODIUM) capsule 2 mg  2 mg Oral PRN Madelyn Flavors A, MD       magnesium oxide (MAG-OX) tablet 400 mg  400 mg Oral Daily Rai,  Ripudeep K, MD   400 mg at 10/21/22 1001   pantoprazole (PROTONIX) EC tablet 40 mg  40 mg Oral Q0600 Rodolph Bong, MD   40 mg at 10/22/22 0550   rosuvastatin (CRESTOR) tablet 5 mg  5 mg Oral Daily Madelyn Flavors A, MD   5 mg at 10/21/22 1000   sodium chloride flush (NS) 0.9 % injection 10-40 mL  10-40 mL Intracatheter Q12H Smith, Rondell A, MD   10 mL at 10/21/22 2131   sodium chloride flush (NS) 0.9 % injection 10-40 mL  10-40 mL Intracatheter PRN Madelyn Flavors A, MD       sodium chloride flush (NS) 0.9 % injection 3 mL  3 mL Intravenous Q12H Katrinka Blazing, Rondell A, MD   3 mL at 10/21/22 2131    sodium chloride tablet 2 g  2 g Oral BID WC [name], MD   2 g at 10/22/22 0550   trimethobenzamide (TIGAN) injection 200 mg  200 mg Intramuscular Q6H PRN Madelyn Flavors A, MD       umeclidinium bromide (INCRUSE ELLIPTA) 62.5 MCG/ACT 1 puff  1 puff Inhalation Daily Rai, Ripudeep K, MD   1 puff at 10/22/22 0748   Facility-Administered Medications Ordered in Other Encounters  Medication Dose Route Frequency Provider Last Rate Last Admin   diphenhydrAMINE (BENADRYL) 50 MG/ML injection            famotidine (PEPCID) 20-0.9 MG/50ML-% IVPB               Review of Systems: 10 systems reviewed and negative except per interval history/subjective  Physical Exam: Vitals:   10/22/22 0719 10/22/22 0750  BP: 118/89   Pulse: (!) 105   Resp: 20   Temp:    SpO2: 90% 90%   Total I/O In: 395 [P.O.:395] Out: 100 [Urine:100]  Intake/Output Summary (Last 24 hours) at 10/22/2022 0926 Last data filed at 10/22/2022 0900 Gross per 24 hour  Intake 1025.13 ml  Output 100 ml  Net 925.13 ml   Constitutional: chronically ill appearing, thin ENMT: ears and nose without scars or lesions, MMM CV: normal rate, no edema Respiratory: bilateral chest rise, normal work of breathing Gastrointestinal: mild distended, nontender Skin: no visible lesions or rashes Psych: alert, judgement/insight appropriate, appropriate mood and affect   Test Results I personally reviewed new and old clinical labs and radiology tests Lab Results  Component Value Date   NA 126 (L) 10/22/2022   K 4.5 10/22/2022   CL 88 (L) 10/22/2022   CO2 26 10/22/2022   BUN 37 (H) 10/22/2022   CREATININE 0.74 10/22/2022   CALCIUM 9.3 10/22/2022   ALBUMIN 2.7 (L) 10/19/2022   PHOS 3.3 10/13/2022    CBC Recent Labs  Lab 10/20/22 0240 10/21/22 0010 10/22/22 0326  WBC 8.2 8.4 9.5  NEUTROABS 7.0  --   --   HGB 12.2* 11.5* 11.8*  HCT 35.6* 34.8* 35.7*  MCV 82.2 84.7 83.6  PLT 214 221 240

## 2022-10-22 NOTE — Progress Notes (Signed)
Rounding Note    Patient Name: Jacob Reynolds Date of Encounter: 10/22/2022  [facility] Cardiologist: None   Subjective   Having some sob, back pain. Otherwise no changes.  Inpatient Medications    Scheduled Meds:  belzutifan  120 mg Oral Daily   Chlorhexidine Gluconate Cloth  6 each Topical Daily   enoxaparin (LOVENOX) injection  40 mg Subcutaneous Q24H   feeding supplement  237 mL Oral BID BM   fluticasone furoate-vilanterol  1 puff Inhalation Daily   furosemide  20 mg Oral BID   lidocaine  2 patch Transdermal Q24H   magnesium oxide  400 mg Oral Daily   pantoprazole  40 mg Oral Q0600   rosuvastatin  5 mg Oral Daily   sodium chloride flush  10-40 mL Intracatheter Q12H   sodium chloride flush  3 mL Intravenous Q12H   sodium chloride  2 g Oral BID WC   umeclidinium bromide  1 puff Inhalation Daily   Continuous Infusions:   PRN Meds: acetaminophen **OR** acetaminophen, albuterol, EPINEPHrine, HYDROmorphone, lidocaine-prilocaine, loperamide, sodium chloride flush, trimethobenzamide   Vital Signs    Vitals:   10/21/22 2306 10/22/22 0308 10/22/22 0719 10/22/22 0750  BP: 117/85 (!) 117/94 118/89   Pulse: (!) 103  (!) 105   Resp: 16 19 20    Temp: 97.7 F (36.5 C) 97.6 F (36.4 C)    TempSrc: Oral Oral Oral   SpO2: 91% 90% 90% 90%  Weight:      Height:        Intake/Output Summary (Last 24 hours) at 10/22/2022 0840 Last data filed at 10/22/2022 0725 Gross per 24 hour  Intake 870.13 ml  Output 100 ml  Net 770.13 ml      10/13/2022    1:06 PM 10/11/2022   11:04 AM 10/09/2022    4:23 AM  Last 3 Weights  Weight (lbs) 148 lb 2.4 oz 146 lb 3.2 oz 160 lb 4.4 oz  Weight (kg) 67.2 kg 66.316 kg 72.7 kg      Telemetry    Sinus tachycardia- Personally Reviewed  ECG    N/a - Personally Reviewed  Physical Exam   VS:  BP 118/89 (BP Location: Right Arm)   Pulse (!) 105   Temp 97.6 F (36.4 C) (Oral)   Resp 20   Ht 5\' 11"  (1.803 m)   Wt 67.2 kg    SpO2 90%   BMI 20.66 kg/m  , BMI Body mass index is 20.66 kg/m. GENERAL: Ill-appearing.  Cachectic  HEENT: Pupils equal round and reactive, fundi not visualized, oral mucosa unremarkable NECK:  No jugular venous distention, waveform within normal limits, carotid upstroke brisk and symmetric, no bruits, no thyromegaly LUNGS:  Clear to auscultation bilaterally HEART:  RRR.  PMI not displaced or sustained,S1 and S2 within normal limits, no S3, no S4, no clicks, no rubs, no murmurs ABD:  Flat, positive bowel sounds normal in frequency in pitch, no bruits, no rebound, no guarding, no midline pulsatile mass, no hepatomegaly, no splenomegaly EXT:  2 plus pulses throughout, no edema, no cyanosis no clubbing SKIN:  No rashes no nodules NEURO:  Cranial nerves II through XII grossly intact, motor grossly intact throughout PSYCH:  Cognitively intact, oriented to person place and time   Labs    High Sensitivity Troponin:   Recent Labs  Lab 10/13/22 0310 10/13/22 0530  TROPONINIHS 24* 24*     Chemistry Recent Labs  Lab 10/17/22 0340 10/17/22 1710 10/18/22 0400 10/18/22  1400 10/19/22 0330 10/20/22 0240 10/21/22 0010 10/22/22 0326  NA 125*   < > 121*   < > 122* 123* 124* 126*  K 4.9  --  5.0  --  4.3 4.8 4.6 4.5  CL 89*  --  90*  --  86* 87* 88* 88*  CO2 21*  --  22  --  24 24 25 26   GLUCOSE 105*  --  108*  --  97 109* 104* 106*  BUN 23*  --  20  --  30* 30* 41* 37*  CREATININE 0.90  --  0.79  --  0.73 0.70 0.76 0.74  CALCIUM 8.4*  --  8.3*  --  8.5* 8.9 8.8* 9.3  MG  --   --   --    < > 2.1 1.8 2.3 2.1  PROT 5.5*  --  5.3*  --  5.7*  --   --   --   ALBUMIN 1.9*  --  1.7*  --  2.7*  --   --   --   AST 26  --  34  --  35  --   --   --   ALT 21  --  26  --  26  --   --   --   ALKPHOS 176*  --  230*  --  232*  --   --   --   BILITOT 0.3  --  0.3  --  0.5  --   --   --   GFRNONAA >60  --  >60  --  >60 >60 >60 >60  ANIONGAP 15  --  9  --  12 12 11 12    < > = values in this interval  not displayed.    Lipids No results for input(s): "CHOL", "TRIG", "HDL", "LABVLDL", "LDLCALC", "CHOLHDL" in the last 168 hours.  Hematology Recent Labs  Lab 10/20/22 0240 10/21/22 0010 10/22/22 0326  WBC 8.2 8.4 9.5  RBC 4.33 4.11* 4.27  HGB 12.2* 11.5* 11.8*  HCT 35.6* 34.8* 35.7*  MCV 82.2 84.7 83.6  MCH 28.2 28.0 27.6  MCHC 34.3 33.0 33.1  RDW 24.5* 24.4* 24.7*  PLT 214 221 240   Thyroid No results for input(s): "TSH", "FREET4" in the last 168 hours.  BNPNo results for input(s): "BNP", "PROBNP" in the last 168 hours.  DDimer No results for input(s): "DDIMER" in the last 168 hours.   Radiology    IR ABDOMEN US LIMITED  Result Date: 10/21/2022 CLINICAL DATA:  Ascites EXAM: LIMITED ABDOMEN ULTRASOUND FOR ASCITES TECHNIQUE: Limited ultrasound survey for ascites was performed in all four abdominal quadrants. COMPARISON:  None Available. FINDINGS: Focused sonographic exam of the abdomen demonstrates small volume ascites. The patient declined paracentesis. No intervention performed. IMPRESSION: Small volume ascites. The patient declined paracentesis at this time. Electronically Signed   By: Olive Bass M.D.   On: 10/21/2022 16:17   [place] ASCITES (ABDOMEN LIMITED)  Result Date: 10/21/2022 CLINICAL DATA:  Ascites EXAM: LIMITED ABDOMEN ULTRASOUND FOR ASCITES TECHNIQUE: Limited ultrasound survey for ascites was performed in all four abdominal quadrants. COMPARISON:  10/20/2022 FINDINGS: Sonographic evaluation of the abdomen demonstrates mild right abdominal ascites. Trace left abdominal ascites is seen. IMPRESSION: Mild right abdominal ascites. Electronically Signed   By: Acquanetta Belling M.D.   On: 10/21/2022 15:10   IR Paracentesis  Result Date: 10/20/2022 INDICATION: Patient with recently diagnosed metastatic renal cancer with recurrent ascites and pleural effusion. Request for therapeutic paracentesis. EXAM: ULTRASOUND  GUIDED THERAPEUTIC PARACENTESIS MEDICATIONS: 6 mL 1% lidocaine  COMPLICATIONS: None immediate. PROCEDURE: Informed written consent was obtained from the patient after a discussion of the risks, benefits and alternatives to treatment. A timeout was performed prior to the initiation of the procedure. Initial ultrasound scanning demonstrates a small amount of ascites within the right upper abdominal quadrant. The right lower abdomen was prepped and draped in the usual sterile fashion. 1% lidocaine was used for local anesthesia. Following this, a 6 Fr Safe-T-Centesis catheter was introduced. An ultrasound image was saved for documentation purposes. The paracentesis was performed. The catheter was removed and a dressing was applied. The patient tolerated the procedure well without immediate post procedural complication. FINDINGS: A total of approximately 1.2 L of cloudy yellow (chylous appearing) fluid was removed. IMPRESSION: Successful ultrasound-guided paracentesis yielding 1.2 liters of peritoneal fluid. Performed by Lynnette Caffey, PA-C Electronically Signed   By: Irish Lack M.D.   On: 10/20/2022 09:55    Cardiac Studies   Echo 10/17/22:   IMPRESSIONS    1. Left ventricular ejection fraction, by estimation, is 45 to 50%. The  LV was not well-visualized but does appear abnormal. The left ventricle  has mildly decreased function. The left ventricle demonstrates global  hypokinesis. Cardiac MRI may allow a  better view and quantification of LV systolic function. Left ventricular  diastolic parameters are consistent with Grade I diastolic dysfunction  (impaired relaxation).   2. Right ventricular systolic function is normal. The right ventricular  size is normal. Tricuspid regurgitation signal is inadequate for assessing  PA pressure.   3. The mitral valve is normal in structure. No evidence of mitral valve  regurgitation. No evidence of mitral stenosis.   4. The aortic valve is tricuspid. Aortic valve regurgitation is not  visualized. No aortic stenosis  is present.   5. Aortic dilatation noted. There is mild dilatation of the aortic root,  measuring 38 mm.   6. The inferior vena cava is normal in size with greater than 50%  respiratory variability, suggesting right atrial pressure of 3 mmHg.   Patient Profile     Mr. Tomita is a 57M with stage IV renal cancer admitted with concern for allergic reaction. Cardiology consulted for abnormal echo.   Assessment & Plan    # Reduced EF SVT: No signs of decompensated CHF. EF 45-50. He was on a TKI which can contribute to cardiomyopathy. He is euvolemic and even cachectic. Does have significant ascites. Won't be aggressive with BB at this time, SVT in the setting of malignancy/ascites/pleural effusions. No changes today.   RCC  Furosemide guided by nephrology.   # Hyperlipidemia: Can stop rosuvastatin if he has problems with ambulation. Noted some leg pain  # Hyponatremia:  Persistent hyponatremia.  Intravascularly seems volume depleted.  RA pressure 3 mmHg on echo.  Nephrology following and managing.    He can be discharged from a cardiology standpoint. We will sign off.   For questions or updates, please contact [facility] Please consult [email or web address] for contact info under        Signed, Maisie Fus, MD  10/22/2022, 8:40 AM

## 2022-10-22 NOTE — Progress Notes (Signed)
Mobility Specialist Progress Note    10/22/22 1646  Mobility  Activity Ambulated with assistance in room  Level of Assistance Contact guard assist, steadying assist  Assistive Device Other (Comment) (HHA)  Distance Ambulated (ft) 5 ft  Activity Response Tolerated well  Mobility Referral Yes  $Mobility charge 1 Mobility  Mobility Specialist Start Time (ACUTE ONLY) 1640  Mobility Specialist Stop Time (ACUTE ONLY) 1646  Mobility Specialist Time Calculation (min) (ACUTE ONLY) 6 min   Post-Mobility: 117 HR  Pt received in bed c/o back pain. Agreeable to get to chair for dinner. Left with call bell in reach. RN notified.   [place] Mobility Specialist  Please Neurosurgeon or Rehab Office at [phone]

## 2022-10-23 ENCOUNTER — Inpatient Hospital Stay (HOSPITAL_COMMUNITY): Payer: 59

## 2022-10-23 DIAGNOSIS — R112 Nausea with vomiting, unspecified: Secondary | ICD-10-CM

## 2022-10-23 DIAGNOSIS — R0602 Shortness of breath: Secondary | ICD-10-CM | POA: Diagnosis not present

## 2022-10-23 DIAGNOSIS — J189 Pneumonia, unspecified organism: Secondary | ICD-10-CM | POA: Diagnosis not present

## 2022-10-23 DIAGNOSIS — E871 Hypo-osmolality and hyponatremia: Secondary | ICD-10-CM | POA: Diagnosis not present

## 2022-10-23 DIAGNOSIS — R7989 Other specified abnormal findings of blood chemistry: Secondary | ICD-10-CM | POA: Diagnosis not present

## 2022-10-23 LAB — BASIC METABOLIC PANEL
Anion gap: 15 (ref 5–15)
BUN: 35 mg/dL — ABNORMAL HIGH (ref 6–20)
CO2: 24 mmol/L (ref 22–32)
Calcium: 9.7 mg/dL (ref 8.9–10.3)
Chloride: 87 mmol/L — ABNORMAL LOW (ref 98–111)
Creatinine, Ser: 0.86 mg/dL (ref 0.61–1.24)
GFR, Estimated: >60 mL/min (ref >60)
Glucose, Bld: 121 mg/dL — ABNORMAL HIGH (ref 70–99)
Potassium: 5 mmol/L (ref 3.5–5.1)
Sodium: 126 mmol/L — ABNORMAL LOW (ref 135–145)

## 2022-10-23 MED ORDER — BISACODYL 10 MG RE SUPP
10.0000 mg | Freq: Once | RECTAL | Status: AC
  Administered 2022-10-24: 10 mg via RECTAL
  Filled 2022-10-23: qty 1

## 2022-10-23 MED ORDER — LORAZEPAM 2 MG/ML IJ SOLN
0.5000 mg | Freq: Once | INTRAMUSCULAR | Status: AC
  Administered 2022-10-23: 0.5 mg via INTRAVENOUS
  Filled 2022-10-23: qty 1

## 2022-10-23 MED ORDER — PROCHLORPERAZINE EDISYLATE 10 MG/2ML IJ SOLN: 5.0000 mg | Freq: Four times a day (QID) | INTRAMUSCULAR | Status: DC | PRN

## 2022-10-23 MED ORDER — FUROSEMIDE 20 MG PO TABS
20.0000 mg | ORAL_TABLET | Freq: Two times a day (BID) | ORAL | Status: DC
  Administered 2022-10-24 – 2022-10-26 (×5): 20 mg via ORAL
  Filled 2022-10-23 (×6): qty 1

## 2022-10-23 NOTE — Progress Notes (Signed)
Nephrology Follow-Up Consult note   Assessment/Recommendations: Jacob Reynolds is a/an 57 y.o. male with a past medical history significant for stage IV renal cancer admitted for hyponatremia.       Hypervolemic now euvolemic hyponatremia: In the setting of volume overload with low urine sodium. Asymptomatic currently. Likely some form of HRS physiology as well as possible heart failure contributing.  Persistently low sodium would argue in regards to these etiologies.  However, given his malignancy SIADH is possible. -Continue salt tabs 2 g twice daily.  -Continue laisx 20mg  BID; less so for volume overload and more as adjunctive treatment of hyponatremia -Has plan for weekly LVPs outpatient -Sodium has improved and the patient remains symptomatic.  Does not have to remain inpatient per my perspective  Recurrent ascites: Status post paracentesis on 5/3 and 5/9.  Diuretics as above.  Plan for outpatient LVP.  Metastatic cancer: Renal cell.  Management per oncology  Hypertension: Given low blood pressures we stopped amlodipine  HFrEF: cardiology following.  Evidence-based medicine limited by hypotension.  Volume status acceptable. Consider SGLT2i which may help with hyponatremia as well  Given the patient's stability in sodium we will sign off at this time.  Outpatient follow-up is being arranged as well as labs.  Please do not hesitate to contact us if further help is needed.    Recommendations conveyed to primary service.    [name] [facility] 10/23/2022 8:48 AM  ___________________________________________________________  CC: ascites, hyponatremia  Interval History/Subjective: Patient feels the same.  Some nausea.  Also some ongoing pain.  Sodium stable.   Medications:  Current Facility-Administered Medications  Medication Dose Route Frequency Provider Last Rate Last Admin   acetaminophen (TYLENOL) tablet 650 mg  650 mg Oral Q6H PRN Madelyn Flavors  A, MD   325 mg at 10/21/22 1534   Or   acetaminophen (TYLENOL) suppository 650 mg  650 mg Rectal Q6H PRN Madelyn Flavors A, MD       albuterol (PROVENTIL) (2.5 MG/3ML) 0.083% nebulizer solution 2.5 mg  2.5 mg Nebulization Q2H PRN Clydie Braun, MD       belzutifan [facility]) tablet 120 mg  120 mg Oral Daily Malachy Mood, MD   120 mg at 10/22/22 1504   bisacodyl (DULCOLAX) suppository 10 mg  10 mg Rectal Once Rodolph Bong, MD       Chlorhexidine Gluconate Cloth 2 % PADS 6 each  6 each Topical Daily Madelyn Flavors A, MD   6 each at 10/22/22 0908   enoxaparin (LOVENOX) injection 40 mg  40 mg Subcutaneous Q24H Madelyn Flavors A, MD   40 mg at 10/22/22 0937   EPINEPHrine (EPI-PEN) injection 0.3 mg  0.3 mg Intramuscular Once PRN Tilden Fossa, MD       feeding supplement (ENSURE ENLIVE / ENSURE PLUS) liquid 237 mL  237 mL Oral BID BM Opyd, Lavone Neri, MD   237 mL at 10/21/22 1549   fluticasone furoate-vilanterol (BREO ELLIPTA) 200-25 MCG/ACT 1 puff  1 puff Inhalation Daily Rai, Ripudeep K, MD   1 puff at 10/22/22 0748   furosemide (LASIX) tablet 20 mg  20 mg Oral BID [name], MD   20 mg at 10/22/22 1736   HYDROmorphone (DILAUDID) tablet 2 mg  2 mg Oral Q6H PRN Madelyn Flavors A, MD   2 mg at 10/22/22 2347   lidocaine (LIDODERM) 5 % 2 patch  2 patch Transdermal Q24H Madelyn Flavors A, MD   2 patch at 10/22/22 1135  lidocaine-prilocaine (EMLA) cream   Topical PRN Hillary Bow, DO       loperamide (IMODIUM) capsule 2 mg  2 mg Oral PRN Madelyn Flavors A, MD       magnesium oxide (MAG-OX) tablet 400 mg  400 mg Oral Daily Rai, Ripudeep K, MD   400 mg at 10/22/22 0937   pantoprazole (PROTONIX) EC tablet 40 mg  40 mg Oral Q0600 Rodolph Bong, MD   40 mg at 10/23/22 0524   rosuvastatin (CRESTOR) tablet 5 mg  5 mg Oral Daily Madelyn Flavors A, MD   5 mg at 10/22/22 1610   simethicone (MYLICON) chewable tablet 160 mg  160 mg Oral QID Rodolph Bong, MD   160 mg at 10/22/22 2203   sodium  chloride flush (NS) 0.9 % injection 10-40 mL  10-40 mL Intracatheter Q12H Smith, Rondell A, MD   10 mL at 10/22/22 2205   sodium chloride flush (NS) 0.9 % injection 10-40 mL  10-40 mL Intracatheter PRN Madelyn Flavors A, MD       sodium chloride flush (NS) 0.9 % injection 3 mL  3 mL Intravenous Q12H Smith, Rondell A, MD   3 mL at 10/22/22 2205   sodium chloride tablet 2 g  2 g Oral BID WC [name], MD   2 g at 10/23/22 9604   trimethobenzamide (TIGAN) injection 200 mg  200 mg Intramuscular Q6H PRN Madelyn Flavors A, MD       umeclidinium bromide (INCRUSE ELLIPTA) 62.5 MCG/ACT 1 puff  1 puff Inhalation Daily Rai, Ripudeep K, MD   1 puff at 10/22/22 0748   Facility-Administered Medications Ordered in Other Encounters  Medication Dose Route Frequency Provider Last Rate Last Admin   diphenhydrAMINE (BENADRYL) 50 MG/ML injection            famotidine (PEPCID) 20-0.9 MG/50ML-% IVPB               Review of Systems: 10 systems reviewed and negative except per interval history/subjective  Physical Exam: Vitals:   10/23/22 0835 10/23/22 0844  BP:    Pulse:    Resp:    Temp:    SpO2: (!) 88% 90%   No intake/output data recorded.  Intake/Output Summary (Last 24 hours) at 10/23/2022 0848 Last data filed at 10/22/2022 1724 Gross per 24 hour  Intake 515 ml  Output 125 ml  Net 390 ml   Constitutional: chronically ill appearing, thin ENMT: ears and nose without scars or lesions, MMM CV: normal rate, no edema Respiratory: bilateral chest rise, normal work of breathing Gastrointestinal: mild distended, nontender Skin: no visible lesions or rashes Psych: alert, judgement/insight appropriate, appropriate mood and affect   Test Results I personally reviewed new and old clinical labs and radiology tests Lab Results  Component Value Date   NA 126 (L) 10/23/2022   K 5.0 10/23/2022   CL 87 (L) 10/23/2022   CO2 24 10/23/2022   BUN 35 (H) 10/23/2022   CREATININE 0.86 10/23/2022    CALCIUM 9.7 10/23/2022   ALBUMIN 2.7 (L) 10/19/2022   PHOS 3.3 10/13/2022    CBC Recent Labs  Lab 10/20/22 0240 10/21/22 0010 10/22/22 0326  WBC 8.2 8.4 9.5  NEUTROABS 7.0  --   --   HGB 12.2* 11.5* 11.8*  HCT 35.6* 34.8* 35.7*  MCV 82.2 84.7 83.6  PLT 214 221 240

## 2022-10-23 NOTE — Progress Notes (Signed)
Mobility Specialist Progress Note    10/23/22 0913  Mobility  Activity Ambulated with assistance to bathroom  Level of Assistance Contact guard assist, steadying assist  Assistive Device Other (Comment) (HHA)  Distance Ambulated (ft) 30 ft (15+15)  Activity Response Tolerated well  Mobility Referral Yes  $Mobility charge 1 Mobility  Mobility Specialist Start Time (ACUTE ONLY) 0850  Mobility Specialist Stop Time (ACUTE ONLY) 0912  Mobility Specialist Time Calculation (min) (ACUTE ONLY) 22 min   Pre-Mobility: 109 HR, 91% SpO2 During Mobility: 120 HR, 94% SpO2 Post-Mobility: 111 HR, 106/91 (98) BP, 94% SpO2  Pt received in bed and agreeable. Had BM. C/o being a little lightheaded once out of BR and did stumble x1 requiring HHA to recover. Returned to chair with call bell in reach. On 3LO2.   [place] Mobility Specialist  Please Neurosurgeon or Rehab Office at [phone]

## 2022-10-23 NOTE — Progress Notes (Addendum)
PROGRESS NOTE    Jacob Reynolds  MVH:[id number] DOB: [birth year] DOA: 10/13/2022 PCP: [name], FNP    Chief Complaint  Patient presents with   Shortness of Breath    Brief Narrative:  Patient is a 57 year old male with HTN, stage IV renal cancer with metastasis to bone presented with feeling of " throat closing up".  He questioned if it was an allergic reaction as he was on several medications. He had not darted belzutifan 120 mg daily he had as he was supposed to start today. Patient just recently hospitalized 4/27-4/29 with intractable nausea, vomiting, and abdominal Per patient, since being home he had increased abdominal swelling, dry mouth.  His throat symptoms improved in the ER however still felt some discomfort.  He previously had thrush.  Also reported nausea, back pain, fatigue, shortness of breath, substernal chest discomfort during the episode. En route with EMS, patient was given epinephrine nebulizer treatment with improvement in his symptoms   In ED, noted to be tachycardia, tachypnea, O2 sats on room air. Sodium 122, CO2 19, BUN 22, creatinine 1.23 X-rays of the neck negative.  Chest x-ray showed diffuse interstitial groundglass opacities, mildly increased, representing edema superimposed on interstitial changes.  VQ scan negative for PE Patient was admitted for further workup.   Significant events 5/3: Underwent paracentesis, 3.9 L removed.  Oncology consulted 5/4: Underwent thoracentesis, left, 520 cc removed.  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  Throat closing/probable CAP -It is noted that on admission patient had presented with acute feeling of throat closing and shortness of breath. -Chest x-ray done with diffuse interstitial groundglass opacities. -V/Q done was negative for PE. -Plain films of the neck done unremarkable. -CT soft tissue neck showed no acute findings, left supraclavicular lymph node increased in size, left pleural effusion decreased. -Patient subsequently noted to have a pleural effusion and underwent thoracentesis with 520 cc of cloudy yellow chylous appearing fluid removed, which was consistent with malignant cells -Some clinical improvement. -PT/OT. -Noted to have been on doxycycline IV Rocephin and has completed a course of doxycycline and IV Rocephin. -Supportive care.  2.  Acute on chronic hyponatremia -Sodium noted at 130 on 10/10/2022 when patient recently discharged. -Sodium at 122 on admission. -Urine sodium < 10, serum osmolality 259, urine osmolality 758, concern for possible SIADH in the setting of patient's metastatic cancer. -Patient started on chemotherapy on 10/15/2022, with Belzutifan. -Per nephrology concern for hypervolemic hyponatremia, also concern for some form of HRS physiology as well as dilutional iso-SIADH. -Sodium level at 126 this morning. -Continue Lasix 20 mg twice daily (per nephrology last saw her for volume overload and more as an adjunct treatment for hyponatremia). -Continue salt tablets. -Urea discontinued per nephrology. -Patient underwent paracentesis 10/20/2022, with plan for weekly LVP's in the outpatient setting. -Status post IV albumin.. -Per nephrology.  3.  Mildly elevated troponin/Abn 2 d echo -Troponins noted to be flattened.  -2D echo with EF of 45 to 50%, left ventricle with mildly decreased  function, LV was not well-visualized but appears abnormal, left ventricular global  hypokinesis cardiac MRI may allow better view and quantification of LV systolic function.   -Patient requesting to be seen by cardiology for further evaluation and recommendations on abnormal 2D echo.   -Currently asymptomatic.  -Patient seen in consultation by cardiology, and given his comorbid disease and lack of ischemic symptoms no plan for ischemic evaluation at this time and recommended guideline directed medical therapy with somewhat limited by patient's hypotension. -Per cardiology if blood pressure improves will consider addition of metoprolol and ARB rather than amlodipine which he was previously taking. -Lasix/diuretics being guided by nephrology. -Continue Crestor. -Appreciate cardiology input and recommendations.  4.  Stage IV renal cell carcinoma with mets to the bone/malignant ascites/recurrent hypoalbuminemia/malignant pleural effusion -Patient noted to be followed by Dr. Katragadda/oncology recently completed XRT. -Recent CT with progression of disease with left pleural effusion, ascites, concern for lymphangitic spread. -Patient was supposed to have started on belzutifan on the day of admission. -Status post paracentesis 10/14/2022 with 3.9 L of fluid removed. -Status post paracentesis 10/20/2022 with 1.2 L of fluid removed. -Currently on scheduled IV albumin. -Status post left-sided thoracentesis on 5/4, 520 cc of fluid removed with cytology consistent with malignant cells. -Patient started on belzutifan per oncology recommendations during this hospitalization. -Dr.Rai discussed with oncology, Dr. Mosetta Putt, Pleurx and abdomen generally not recommended per IR due to risk of infection. -Dr.Rai also discussed with patient's primary oncologist, Dr. Ellin Saba on 5/ 6 and will arrange outpatient paracentesis at [facility] weekly. -Patient with abdominal distention again 10/21/2022, patient seen by IR and limited abdominal ultrasound showed a small amount of peritoneal fluid noted and as such  paracentesis not performed as patient elected to defer procedure until there was more accumulation.  -Supportive care. -Consulted palliative care for goals of care.  5.  General debility -Patient notes to be living alone with difficulty getting around. -Continue PT/OT. -Will likely need home health on discharge.  6.  QT prolongation -QTc noted at 690 on presentation. -Avoid QT prolongation medications. -Keep potassium approximately 4, magnesium approximately 2. -Repeat EKG on 10/19/2022 with resolution of QT prolongation.  7.  Hyperlipidemia -Statin.   8.  Hypertension -Norvasc was discontinued due to soft blood pressure.  -Patient currently on oral Lasix per nephrology due to acute on chronic hyponatremia. -BP soft but patient with nausea and vomiting.  9.  Severe protein calorie malnutrition -Albumin noted significantly low at 1.9. -Status post IV albumin.  -Estimated BMI 20.66 kg/m. -Nutritional supplementation.  10.  Nausea vomiting abdominal distention -Patient with nausea and vomiting x 5 this morning, patient with abdominal distention. -Patient states passing flatus and had bowel movement. -Check abdominal films. -IV antiemetics. -Downgrade diet to clear liquids. -Dulcolax suppository.   DVT prophylaxis: Lovenox Code Status: Full Family Communication: Updated patient.  No family at bedside. Disposition: Likely home with home health when clinically stable with significant improvement with hyponatremia and cleared by nephrology.Marland Kitchen  Hopefully home in the next 24 hours.  Status is: Inpatient Remains inpatient appropriate because: Severity of illness   Consultants:  Nephrology: Dr.Schertz 10/16/2022 Oncology: Dr. Mosetta Putt 10/14/2022 Cardiology: Dr. Duke Salvia 10/20/2022  Procedures:  CT soft tissue neck 10/13/2022 Chest x-ray 10/13/2022, 10/15/2022 Plain films neck 10/13/2022 VQ scan 10/13/2022 2D echo 10/17/2022 Successful right Ig power injectable Port-A-Cath placement per IR: Dr.  Deanne Coffer 10/15/2021 Ultrasound-guided therapeutic and diagnostic left-sided paracentesis per IR: Dr. Lowella Dandy 10/14/2022--- 3.9 L of cloudy yellow fluid removed Left-sided guided ultrasound-guided thoracentesis  per IR--Dr. Grace Isaac 10/15/2022 with 520 cc of cloudy yellow, chylous appearing fluid. Therapeutic ultrasound-guided paracentesis 1.2 L of cloudy yellow fluid removed per IR: Lynnette Caffey, PA 10/20/2022 Abdominal ultrasound 10/21/2022  Antimicrobials:  Anti-infectives (From admission, onward)    Start     Dose/Rate Route Frequency Ordered Stop   10/14/22 2200  doxycycline (VIBRA-TABS) tablet 100 mg        100 mg Oral Every 12 hours 10/14/22 1140 10/18/22 0845   10/13/22 2200  doxycycline (VIBRAMYCIN) 100 mg in sodium chloride 0.9 % 250 mL IVPB  Status:  Discontinued        100 mg 125 mL/hr over 120 Minutes Intravenous Every 12 hours 10/13/22 2050 10/14/22 1140   10/13/22 2100  cefTRIAXone (ROCEPHIN) 2 g in sodium chloride 0.9 % 100 mL IVPB  Status:  Discontinued        2 g 200 mL/hr over 30 Minutes Intravenous Every 24 hours 10/13/22 2048 10/22/22 0834         Subjective: Patient laying in bed.  Noted to have vomited 5 times this morning.  Complains of nausea.  Some abdominal distention.  States had bowel movement this morning.  Passing flatus.   Objective: Vitals:   10/23/22 0725 10/23/22 0835 10/23/22 0844 10/23/22 1040  BP: (!) 121/98   100/85  Pulse: (!) 103   (!) 103  Resp:    19  Temp: 97.7 F (36.5 C)   97.7 F (36.5 C)  TempSrc: Axillary   Oral  SpO2:  (!) 88% 90% 93%  Weight:      Height:        Intake/Output Summary (Last 24 hours) at 10/23/2022 1141 Last data filed at 10/23/2022 1048 Gross per 24 hour  Intake 560 ml  Output 100 ml  Net 460 ml    Filed Weights   10/13/22 1306  Weight: 67.2 kg    Examination:  General exam: NAD. Respiratory system: CTAB anterior lung fields.  No wheezes, no crackles.  Fair air movement.  Speaking in full sentences.   Cardiovascular system: Regular rate rhythm no murmurs rubs or gallops.  No JVD.  No significant pitting lower extremity edema.  Gastrointestinal system: Abdomen is somewhat tight, distended, hypoactive bowel sounds.  No rebound.  No guarding.  Central nervous system: Alert and oriented. No focal neurological deficits. Extremities: Symmetric 5 x 5 power. Skin: No rashes, lesions or ulcers Psychiatry: Judgement and insight appear normal. Mood & affect appropriate.     Data Reviewed: I have personally reviewed following labs and imaging studies  CBC: Recent Labs  Lab 10/18/22 0400 10/19/22 0330 10/20/22 0240 10/21/22 0010 10/22/22 0326  WBC 7.3 7.4 8.2 8.4 9.5  NEUTROABS  --   --  7.0  --   --   HGB 13.0 12.0* 12.2* 11.5* 11.8*  HCT 39.1 35.1* 35.6* 34.8* 35.7*  MCV 83.4 82.2 82.2 84.7 83.6  PLT 223 203 214 221 240     Basic Metabolic Panel: Recent Labs  Lab 10/19/22 0330 10/20/22 0240 10/21/22 0010 10/22/22 0326 10/23/22 0320  NA 122* 123* 124* 126* 126*  K 4.3 4.8 4.6 4.5 5.0  CL 86* 87* 88* 88* 87*  CO2 24 24 25 26 24   GLUCOSE 97 109* 104* 106* 121*  BUN 30* 30* 41* 37* 35*  CREATININE 0.73 0.70 0.76 0.74 0.86  CALCIUM 8.5* 8.9 8.8* 9.3 9.7  MG 2.1 1.8 2.3 2.1  --      GFR: Estimated Creatinine Clearance: 91.2 mL/min (  by C-G formula based on SCr of 0.86 mg/dL).  Liver Function Tests: Recent Labs  Lab 10/17/22 0340 10/18/22 0400 10/19/22 0330  AST 26 34 35  ALT 21 26 26   ALKPHOS 176* 230* 232*  BILITOT 0.3 0.3 0.5  PROT 5.5* 5.3* 5.7*  ALBUMIN 1.9* 1.7* 2.7*     CBG: No results for input(s): "GLUCAP" in the last 168 hours.   Recent Results (from the past 240 hour(s))  Body fluid culture w Gram Stain     Status: None   Collection Time: 10/14/22  3:14 PM   Specimen: Abdomen; Peritoneal Fluid  Result Value Ref Range Status   Specimen Description FLUID PERITONEAL ABDOMEN  Final   Special Requests NONE  Final   Gram Stain NO WBC SEEN NO  ORGANISMS SEEN   Final   Culture   Final    NO GROWTH 3 DAYS Performed at [facility], [address] [place], [place] [address]    Report Status 10/17/2022 FINAL  Final  Body fluid culture w Gram Stain     Status: None   Collection Time: 10/15/22  1:32 PM   Specimen: PATH Cytology Pleural fluid  Result Value Ref Range Status   Specimen Description PLEURAL  Final   Special Requests NONE  Final   Gram Stain   Final    RARE WBC PRESENT,BOTH PMN AND MONONUCLEAR NO ORGANISMS SEEN    Culture   Final    NO GROWTH 3 DAYS Performed at [facility], [address] [place], [place] [address]    Report Status 10/18/2022 FINAL  Final         Radiology Studies: IR ABDOMEN US LIMITED  Result Date: 10/21/2022 CLINICAL DATA:  Ascites EXAM: LIMITED ABDOMEN ULTRASOUND FOR ASCITES TECHNIQUE: Limited ultrasound survey for ascites was performed in all four abdominal quadrants. COMPARISON:  None Available. FINDINGS: Focused sonographic exam of the abdomen demonstrates small volume ascites. The patient declined paracentesis. No intervention performed. IMPRESSION: Small volume ascites. The patient declined paracentesis at this time. Electronically Signed   By: Olive Bass M.D.   On: 10/21/2022 16:17   [place] ASCITES (ABDOMEN LIMITED)  Result Date: 10/21/2022 CLINICAL DATA:  Ascites EXAM: LIMITED ABDOMEN ULTRASOUND FOR ASCITES TECHNIQUE: Limited ultrasound survey for ascites was performed in all four abdominal quadrants. COMPARISON:  10/20/2022 FINDINGS: Sonographic evaluation of the abdomen demonstrates mild right abdominal ascites. Trace left abdominal ascites is seen. IMPRESSION: Mild right abdominal ascites. Electronically Signed   By: Acquanetta Belling M.D.   On: 10/21/2022 15:10        Scheduled Meds:  belzutifan  120 mg Oral Daily   bisacodyl  10 mg Rectal Once   Chlorhexidine Gluconate Cloth  6 each Topical Daily   enoxaparin (LOVENOX) injection  40 mg Subcutaneous Q24H    feeding supplement  237 mL Oral BID BM   fluticasone furoate-vilanterol  1 puff Inhalation Daily   furosemide  20 mg Oral BID   lidocaine  2 patch Transdermal Q24H   magnesium oxide  400 mg Oral Daily   pantoprazole  40 mg Oral Q0600   rosuvastatin  5 mg Oral Daily   simethicone  160 mg Oral QID   sodium chloride flush  10-40 mL Intracatheter Q12H   sodium chloride flush  3 mL Intravenous Q12H   sodium chloride  2 g Oral BID WC   umeclidinium bromide  1 puff Inhalation Daily   Continuous Infusions:     LOS: 10  days    Time spent: 35 minutes    Ramiro Harvest, MD [facility]   To contact the attending provider between 7A-7P or the covering provider during after hours 7P-7A, please log into the web site [email or web address] and access using universal [place] password for that web site. If you do not have the password, please call the hospital operator.  10/23/2022, 11:41 AM

## 2022-10-23 NOTE — Progress Notes (Signed)
Pt dry heaving c/o nausea.  Pt refusing PRN antiemetic.

## 2022-10-24 ENCOUNTER — Ambulatory Visit (HOSPITAL_COMMUNITY): Admit: 2022-10-24 | Payer: 59

## 2022-10-24 ENCOUNTER — Inpatient Hospital Stay (HOSPITAL_COMMUNITY): Payer: 59

## 2022-10-24 DIAGNOSIS — R0602 Shortness of breath: Secondary | ICD-10-CM | POA: Diagnosis not present

## 2022-10-24 DIAGNOSIS — R7989 Other specified abnormal findings of blood chemistry: Secondary | ICD-10-CM | POA: Diagnosis not present

## 2022-10-24 DIAGNOSIS — E871 Hypo-osmolality and hyponatremia: Secondary | ICD-10-CM | POA: Diagnosis not present

## 2022-10-24 DIAGNOSIS — J189 Pneumonia, unspecified organism: Secondary | ICD-10-CM | POA: Diagnosis not present

## 2022-10-24 HISTORY — PX: IR PARACENTESIS: IMG2679

## 2022-10-24 LAB — CBC
HCT: 37.2 % — ABNORMAL LOW (ref 39.0–52.0)
Hemoglobin: 12.3 g/dL — ABNORMAL LOW (ref 13.0–17.0)
MCH: 27.8 pg (ref 26.0–34.0)
MCHC: 33.1 g/dL (ref 30.0–36.0)
MCV: 84 fL (ref 80.0–100.0)
Platelets: 270 10*3/uL (ref 150–400)
RBC: 4.43 MIL/uL (ref 4.22–5.81)
RDW: 24.4 % — ABNORMAL HIGH (ref 11.5–15.5)
WBC: 10.1 10*3/uL (ref 4.0–10.5)
nRBC: 0 % (ref 0.0–0.2)

## 2022-10-24 LAB — GLUCOSE, CAPILLARY: Glucose-Capillary: 98 mg/dL (ref 70–99)

## 2022-10-24 LAB — BASIC METABOLIC PANEL
Anion gap: 11 (ref 5–15)
BUN: 37 mg/dL — ABNORMAL HIGH (ref 6–20)
CO2: 22 mmol/L (ref 22–32)
Calcium: 9.1 mg/dL (ref 8.9–10.3)
Chloride: 91 mmol/L — ABNORMAL LOW (ref 98–111)
Creatinine, Ser: 0.93 mg/dL (ref 0.61–1.24)
GFR, Estimated: >60 mL/min (ref >60)
Glucose, Bld: 100 mg/dL — ABNORMAL HIGH (ref 70–99)
Potassium: 5.1 mmol/L (ref 3.5–5.1)
Sodium: 124 mmol/L — ABNORMAL LOW (ref 135–145)

## 2022-10-24 LAB — MAGNESIUM: Magnesium: 2.1 mg/dL (ref 1.7–2.4)

## 2022-10-24 MED ORDER — ALBUMIN HUMAN 25 % IV SOLN
25.0000 g | Freq: Once | INTRAVENOUS | Status: AC
  Administered 2022-10-24: 25 g via INTRAVENOUS
  Filled 2022-10-24: qty 100

## 2022-10-24 MED ORDER — LIDOCAINE HCL 1 % IJ SOLN
INTRAMUSCULAR | Status: AC
  Filled 2022-10-24: qty 20

## 2022-10-24 NOTE — Progress Notes (Signed)
Mobility Specialist Progress Note:    10/24/22 1109  Mobility  Activity Ambulated with assistance in hallway  Level of Assistance Minimal assist, patient does 75% or more  Assistive Device Front wheel walker  Distance Ambulated (ft) 50 ft  Activity Response Tolerated well  Mobility Referral Yes  $Mobility charge 1 Mobility  Mobility Specialist Start Time (ACUTE ONLY) 1010  Mobility Specialist Stop Time (ACUTE ONLY) 1020  Mobility Specialist Time Calculation (min) (ACUTE ONLY) 10 min   Pt received in bed, agreeable to ambulate. C/o dizziness and bilat leg weakness. CGA while ambulating. SPO2 WFL on 4L. Pt assisted back in bed with call light at hand.  Required minA to get back in bed post ambulation.   Thompson Grayer Mobility Specialist  Please contact vis Secure Chat or  Rehab Office [phone]

## 2022-10-24 NOTE — Procedures (Signed)
PROCEDURE SUMMARY:  Successful US guided paracentesis from left lateral abdomen.  Yielded 2.4 liters of yellow, clear fluid.  No immediate complications.  Pt tolerated well.   Specimen was not sent for labs.  EBL < 5mL  Hoyt Koch PA-C 10/24/2022 9:00 AM

## 2022-10-24 NOTE — Progress Notes (Signed)
PROGRESS NOTE    Jacob Reynolds  ZOX:[id number] DOB: [birth year] DOA: 10/13/2022 PCP: [name], FNP    Chief Complaint  Patient presents with   Shortness of Breath    Brief Narrative:  Patient is a 57 year old male with HTN, stage IV renal cancer with metastasis to bone presented with feeling of " throat closing up".  He questioned if it was an allergic reaction as he was on several medications. He had not darted belzutifan 120 mg daily he had as he was supposed to start today. Patient just recently hospitalized 4/27-4/29 with intractable nausea, vomiting, and abdominal Per patient, since being home he had increased abdominal swelling, dry mouth.  His throat symptoms improved in the ER however still felt some discomfort.  He previously had thrush.  Also reported nausea, back pain, fatigue, shortness of breath, substernal chest discomfort during the episode. En route with EMS, patient was given epinephrine nebulizer treatment with improvement in his symptoms   In ED, noted to be tachycardia, tachypnea, O2 sats on room air. Sodium 122, CO2 19, BUN 22, creatinine 1.23 X-rays of the neck negative.  Chest x-ray showed diffuse interstitial groundglass opacities, mildly increased, representing edema superimposed on interstitial changes.  VQ scan negative for PE Patient was admitted for further workup.   Significant events 5/3: Underwent paracentesis, 3.9 L removed.  Oncology consulted 5/4: Underwent thoracentesis, left, 520 cc removed.  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  Throat closing/probable CAP -It is noted that on admission patient had presented with acute feeling of throat closing and shortness of breath. -Chest x-ray done with diffuse interstitial groundglass opacities. -V/Q done was negative for PE. -Plain films of the neck done unremarkable. -CT soft tissue neck showed no acute findings, left supraclavicular lymph node increased in size, left pleural effusion decreased. -Patient subsequently noted to have a pleural effusion and underwent thoracentesis with 520 cc of cloudy yellow chylous appearing fluid removed, which was consistent with malignant cells -Some clinical improvement. -PT/OT. -Noted to have been on doxycycline IV Rocephin and has completed a course of doxycycline and IV Rocephin. -Supportive care.  2.  Acute on chronic hyponatremia -Sodium noted at 130 on 10/10/2022 when patient recently discharged. -Sodium at 122 on admission. -Urine sodium < 10, serum osmolality 259, urine osmolality 758, concern for possible SIADH in the setting of patient's metastatic cancer. -Patient started on chemotherapy on 10/15/2022, with Belzutifan. -Per nephrology concern for hypervolemic hyponatremia, also concern for some form of HRS physiology as well as dilutional iso-SIADH. -Sodium level at 124 this morning. -Continue Lasix 20 mg twice daily (per nephrology last saw her for volume overload and more as an adjunct treatment for hyponatremia). -Continue salt tablets. -Urea discontinued per nephrology. -Patient underwent paracentesis 10/20/2022, with plan for weekly LVP's in the outpatient setting. -Status post IV albumin.. -Per nephrology.  3.  Mildly elevated troponin/Abn 2 d echo -Troponins noted to be flattened.  -2D echo with EF of 45 to 50%, left ventricle with mildly decreased  function, LV was not well-visualized but appears abnormal, left ventricular global  hypokinesis cardiac MRI may allow better view and quantification of LV systolic function.   -Patient requesting to be seen by cardiology for further evaluation and recommendations on abnormal 2D echo.   -Currently asymptomatic.  -Patient seen in consultation by cardiology, and given his comorbid disease and lack of ischemic symptoms no plan for ischemic evaluation at this time and recommended guideline directed medical therapy with somewhat limited by patient's hypotension. -Per cardiology if blood pressure improves will consider addition of metoprolol and ARB rather than amlodipine which he was previously taking. -Lasix/diuretics being guided by nephrology. -Continue Crestor. -Appreciate cardiology input and recommendations.  4.  Stage IV renal cell carcinoma with mets to the bone/malignant ascites/recurrent hypoalbuminemia/malignant pleural effusion -Patient noted to be followed by Dr. Katragadda/oncology recently completed XRT. -Recent CT with progression of disease with left pleural effusion, ascites, concern for lymphangitic spread. -Patient was supposed to have started on belzutifan on the day of admission. -Status post paracentesis 10/14/2022 with 3.9 L of fluid removed. -Status post paracentesis 10/20/2022 with 1.2 L of fluid removed. -Status post paracentesis 10/24/2022 with 2.4 L of yellow clear fluid removed. -Status post IV albumin.  -Status post left-sided thoracentesis on 5/4, 520 cc of fluid removed with cytology consistent with malignant cells. -Patient started on belzutifan per oncology recommendations during this hospitalization. -Dr.Rai discussed with oncology, Dr. Mosetta Putt, Pleurx and abdomen generally not recommended per IR due to risk of infection. -Dr.Rai also discussed with patient's primary oncologist, Dr. Ellin Saba on 5/ 6 and will arrange outpatient paracentesis at [facility] weekly. -Patient with abdominal distention again 10/21/2022, patient seen by IR and limited abdominal  ultrasound showed a small amount of peritoneal fluid noted and as such paracentesis not performed as patient elected to defer procedure until there was more accumulation.  -Patient underwent paracentesis this morning with 2.4 L of clear yellow fluid removed. -Supportive care. -Consulted palliative care for goals of care.  5.  General debility -Patient notes to be living alone with difficulty getting around. -Continue PT/OT. -Will need home health on discharge.    6.  QT prolongation -QTc noted at 690 on presentation. -Avoid QT prolongation medications. -Keep potassium approximately 4, magnesium approximately 2. -Repeat EKG on 10/19/2022 with resolution of QT prolongation.  7.  Hyperlipidemia -Continue statin.   8.  Hypertension -Norvasc was discontinued due to soft blood pressure.  -Patient currently on oral Lasix per nephrology due to acute on chronic hyponatremia. -BP soft but stable.  9.  Severe protein calorie malnutrition -Albumin noted significantly low at 1.9. -Status post IV albumin.  -Estimated BMI 20.66 kg/m. -Nutritional supplementation.  10.  Nausea vomiting abdominal distention -Patient with nausea and vomiting x 5 the morning of 10/23/2022 with some abdominal distention.   -Patient passing flatus, bowel movement.   -No further nausea or vomiting.   -Abdominal films negative for obstruction or ileus.   -Status post paracentesis this morning with some clinical improvement.   -Will give a enema due to concerns for possible constipation.   -Advance diet to a soft diet.   -Supportive care.    DVT prophylaxis: Lovenox Code Status: Full Family Communication: Updated patient.  No family at bedside. Disposition: Likely home with home health when clinically stable with significant improvement with hyponatremia and cleared by nephrology.Marland Kitchen  Hopefully home in the next 24 hours.  Status is: Inpatient Remains inpatient appropriate because: Severity of illness    Consultants:  Nephrology: Dr.Schertz 10/16/2022  Oncology: Dr. Mosetta Putt 10/14/2022 Cardiology: Dr. Duke Salvia 10/20/2022  Procedures:  CT soft tissue neck 10/13/2022 Chest x-ray 10/13/2022, 10/15/2022 Plain films neck 10/13/2022 VQ scan 10/13/2022 2D echo 10/17/2022 Successful right Ig power injectable Port-A-Cath placement per IR: Dr. Deanne Coffer 10/15/2021 Ultrasound-guided therapeutic and diagnostic left-sided paracentesis per IR: Dr. Lowella Dandy 10/14/2022--- 3.9 L of cloudy yellow fluid removed Left-sided guided ultrasound-guided thoracentesis per IR--Dr. Grace Isaac 10/15/2022 with 520 cc of cloudy yellow, chylous appearing fluid. Therapeutic ultrasound-guided paracentesis 1.2 L of cloudy yellow fluid removed per IR: Lynnette Caffey, PA 10/20/2022 Abdominal ultrasound 10/21/2022 Ultrasound-guided paracentesis left lateral abdomen with 2.4 L of yellow clear fluid removed by ZO:XWRUE Andrew Au (10/24/2022)  Antimicrobials:  Anti-infectives (From admission, onward)    Start     Dose/Rate Route Frequency Ordered Stop   10/14/22 2200  doxycycline (VIBRA-TABS) tablet 100 mg        100 mg Oral Every 12 hours 10/14/22 1140 10/18/22 0845   10/13/22 2200  doxycycline (VIBRAMYCIN) 100 mg in sodium chloride 0.9 % 250 mL IVPB  Status:  Discontinued        100 mg 125 mL/hr over 120 Minutes Intravenous Every 12 hours 10/13/22 2050 10/14/22 1140   10/13/22 2100  cefTRIAXone (ROCEPHIN) 2 g in sodium chloride 0.9 % 100 mL IVPB  Status:  Discontinued        2 g 200 mL/hr over 30 Minutes Intravenous Every 24 hours 10/13/22 2048 10/22/22 0834         Subjective: Patient laying in bed.  Denies any further emesis since yesterday.  Tolerating clear liquids.  Asking whether he could have some milk and diet to be advanced.  Stated had a bowel movement this morning.  Diffuse still has some stool that needs to come out.  States had a paracentesis done this morning with approximately 2 L of fluid removed.  Was having some difficulty taking  deep breaths early on prior to paracentesis.  No chest pain.    Objective: Vitals:   10/23/22 2347 10/24/22 0733 10/24/22 0813 10/24/22 0840  BP: 114/86 108/86 101/78 102/73  Pulse: 99 99    Resp: 19 (!) 25    Temp: 98.6 F (37 C) 97.9 F (36.6 C)    TempSrc: Oral Oral    SpO2: 90% 91%    Weight:      Height:        Intake/Output Summary (Last 24 hours) at 10/24/2022 1051 Last data filed at 10/24/2022 0908 Gross per 24 hour  Intake 358 ml  Output --  Net 358 ml    Filed Weights   10/13/22 1306  Weight: 67.2 kg    Examination:  General exam: NAD. Respiratory system: CTAB.  No wheezes, no crackles, no rhonchi.  Fair air movement.  Speaking in full sentences.   Cardiovascular system: RRR no murmurs rubs or gallops.  No JVD.  No pitting lower extremity edema.  Gastrointestinal system: Abdomen is less tight, softer, less distended, positive bowel sounds.  No rebound.  No guarding.  Central nervous system: Alert and oriented. No focal neurological deficits. Extremities: Symmetric 5 x 5 power. Skin: No rashes, lesions or ulcers Psychiatry: Judgement and insight appear normal. Mood & affect appropriate.     Data Reviewed: I have personally reviewed following labs and imaging studies  CBC: Recent Labs  Lab 10/19/22 0330 10/20/22 0240 10/21/22 0010 10/22/22 0326 10/24/22 0415  WBC 7.4 8.2 8.4 9.5 10.1  NEUTROABS  --  7.0  --   --   --  HGB 12.0* 12.2* 11.5* 11.8* 12.3*  HCT 35.1* 35.6* 34.8* 35.7* 37.2*  MCV 82.2 82.2 84.7 83.6 84.0  PLT 203 214 221 240 270     Basic Metabolic Panel: Recent Labs  Lab 10/19/22 0330 10/20/22 0240 10/21/22 0010 10/22/22 0326 10/23/22 0320 10/24/22 0415  NA 122* 123* 124* 126* 126* 124*  K 4.3 4.8 4.6 4.5 5.0 5.1  CL 86* 87* 88* 88* 87* 91*  CO2 24 24 25 26 24 22   GLUCOSE 97 109* 104* 106* 121* 100*  BUN 30* 30* 41* 37* 35* 37*  CREATININE 0.73 0.70 0.76 0.74 0.86 0.93  CALCIUM 8.5* 8.9 8.8* 9.3 9.7 9.1  MG 2.1 1.8 2.3  2.1  --  2.1     GFR: Estimated Creatinine Clearance: 84.3 mL/min (by C-G formula based on SCr of 0.93 mg/dL).  Liver Function Tests: Recent Labs  Lab 10/18/22 0400 10/19/22 0330  AST 34 35  ALT 26 26  ALKPHOS 230* 232*  BILITOT 0.3 0.5  PROT 5.3* 5.7*  ALBUMIN 1.7* 2.7*     CBG: No results for input(s): "GLUCAP" in the last 168 hours.   Recent Results (from the past 240 hour(s))  Body fluid culture w Gram Stain     Status: None   Collection Time: 10/14/22  3:14 PM   Specimen: Abdomen; Peritoneal Fluid  Result Value Ref Range Status   Specimen Description FLUID PERITONEAL ABDOMEN  Final   Special Requests NONE  Final   Gram Stain NO WBC SEEN NO ORGANISMS SEEN   Final   Culture   Final    NO GROWTH 3 DAYS Performed at [facility], [address] [place], [place] [address]    Report Status 10/17/2022 FINAL  Final  Body fluid culture w Gram Stain     Status: None   Collection Time: 10/15/22  1:32 PM   Specimen: PATH Cytology Pleural fluid  Result Value Ref Range Status   Specimen Description PLEURAL  Final   Special Requests NONE  Final   Gram Stain   Final    RARE WBC PRESENT,BOTH PMN AND MONONUCLEAR NO ORGANISMS SEEN    Culture   Final    NO GROWTH 3 DAYS Performed at [facility], [address], [place], [place] [address]    Report Status 10/18/2022 FINAL  Final         Radiology Studies: IR Paracentesis  Result Date: 10/24/2022 INDICATION: Patient with metastatic renal cell carcinoma with recurrent ascites. Request made for therapeutic paracentesis. EXAM: ULTRASOUND GUIDED THERAPEUTIC PARACENTESIS MEDICATIONS: 10 mL 1% lidocaine COMPLICATIONS: None immediate. PROCEDURE: Informed written consent was obtained from the patient after a discussion of the risks, benefits and alternatives to treatment. A timeout was performed prior to the initiation of the procedure. Initial ultrasound scanning demonstrates a small amount of ascites within the  right lower abdominal quadrant. The right lower abdomen was prepped and draped in the usual sterile fashion. 1% lidocaine was used for local anesthesia. Following this, a 19 gauge, 7-cm, Yueh catheter was introduced. An ultrasound image was saved for documentation purposes. The paracentesis was performed. The catheter was removed and a dressing was applied. The patient tolerated the procedure well without immediate post procedural complication. FINDINGS: A total of approximately 2.4 liters of clear, yellow fluid was removed. IMPRESSION: Successful ultrasound-guided paracentesis yielding 2.4 liters of peritoneal fluid. Read by: Loyce Dys PA-C Electronically Signed   By: Corlis Leak M.D.   On: 10/24/2022 10:01  DG Abd Portable 1V  Result Date: 10/23/2022 CLINICAL DATA:  Nausea and vomiting. EXAM: PORTABLE ABDOMEN - 1 VIEW COMPARISON:  Abdominal CT 10/08/2022 FINDINGS: There is colonic distension, stool within the ascending and splenic flexure of the colon. Air within the transverse colon. There is stool distending the rectum. No evidence of small bowel distension or obstruction. Calcifications in the pelvis typical of phleboliths. Left pleural effusion is noted. IMPRESSION: 1. Mild colonic distension with stool and air. No small-bowel distention or evidence of obstruction. 2. Left pleural effusion. Electronically Signed   By: Narda Rutherford M.D.   On: 10/23/2022 17:30        Scheduled Meds:  belzutifan  120 mg Oral Daily   Chlorhexidine Gluconate Cloth  6 each Topical Daily   enoxaparin (LOVENOX) injection  40 mg Subcutaneous Q24H   feeding supplement  237 mL Oral BID BM   fluticasone furoate-vilanterol  1 puff Inhalation Daily   furosemide  20 mg Oral BID   lidocaine  2 patch Transdermal Q24H   magnesium oxide  400 mg Oral Daily   pantoprazole  40 mg Oral Q0600   rosuvastatin  5 mg Oral Daily   simethicone  160 mg Oral QID   sodium chloride flush  10-40 mL Intracatheter Q12H   sodium  chloride flush  3 mL Intravenous Q12H   sodium chloride  2 g Oral BID WC   umeclidinium bromide  1 puff Inhalation Daily   Continuous Infusions:     LOS: 11 days    Time spent: 35 minutes    Ramiro Harvest, MD [facility]   To contact the attending provider between 7A-7P or the covering provider during after hours 7P-7A, please log into the web site [email or web address] and access using universal [place] password for that web site. If you do not have the password, please call the hospital operator.  10/24/2022, 10:51 AM

## 2022-10-24 NOTE — TOC Progression Note (Signed)
Transition of Care [facility]) - Progression Note    Patient Details  Name: Jacob Reynolds MRN: [id number] Date of Birth: 10-23-1965  Transition of Care [facility] CM/SW Contact  Harriet Masson, RN Phone Number: 10/24/2022, 4:20 PM  Clinical Narrative:     Bedside RN notified this RNCM that patient has very weak when transferring from bed to chair.  Spoke to patient who states he would be going home alone and is agreeable to SNF.  Notified MD of PT/OT re-consult needed.   Expected Discharge Plan: Home w Home Health Services Barriers to Discharge: Continued Medical Work up  Expected Discharge Plan and Services   Discharge Planning Services: CM Consult   Living arrangements for the past 2 months: Single Family Home                 DME Arranged:  (Await PT recommendations)         HH Arranged: PT HH Agency: [facility] Date HH Agency Contacted: 10/17/22 Time HH Agency Contacted: 1257 Representative spoke with at [place] Agency: Amy   Social Determinants of Health (SDOH) Interventions SDOH Screenings   Depression (PHQ2-9): Medium Risk (08/08/2022)  Tobacco Use: High Risk (10/24/2022)    Readmission Risk Interventions     No data to display

## 2022-10-25 DIAGNOSIS — J189 Pneumonia, unspecified organism: Secondary | ICD-10-CM | POA: Diagnosis not present

## 2022-10-25 DIAGNOSIS — R0602 Shortness of breath: Secondary | ICD-10-CM | POA: Diagnosis not present

## 2022-10-25 DIAGNOSIS — E871 Hypo-osmolality and hyponatremia: Secondary | ICD-10-CM | POA: Diagnosis not present

## 2022-10-25 DIAGNOSIS — R7989 Other specified abnormal findings of blood chemistry: Secondary | ICD-10-CM | POA: Diagnosis not present

## 2022-10-25 LAB — POTASSIUM: Potassium: 5.6 mmol/L — ABNORMAL HIGH (ref 3.5–5.1)

## 2022-10-25 LAB — BASIC METABOLIC PANEL
Anion gap: 13 (ref 5–15)
BUN: 39 mg/dL — ABNORMAL HIGH (ref 6–20)
CO2: 23 mmol/L (ref 22–32)
Calcium: 9.8 mg/dL (ref 8.9–10.3)
Chloride: 87 mmol/L — ABNORMAL LOW (ref 98–111)
Creatinine, Ser: 1.02 mg/dL (ref 0.61–1.24)
GFR, Estimated: >60 mL/min (ref >60)
Glucose, Bld: 93 mg/dL (ref 70–99)
Potassium: 5.3 mmol/L — ABNORMAL HIGH (ref 3.5–5.1)
Sodium: 123 mmol/L — ABNORMAL LOW (ref 135–145)

## 2022-10-25 LAB — CBC
HCT: 35.2 % — ABNORMAL LOW (ref 39.0–52.0)
Hemoglobin: 11.6 g/dL — ABNORMAL LOW (ref 13.0–17.0)
MCH: 27.7 pg (ref 26.0–34.0)
MCHC: 33 g/dL (ref 30.0–36.0)
MCV: 84 fL (ref 80.0–100.0)
Platelets: 219 10*3/uL (ref 150–400)
RBC: 4.19 MIL/uL — ABNORMAL LOW (ref 4.22–5.81)
RDW: 23.9 % — ABNORMAL HIGH (ref 11.5–15.5)
WBC: 10.9 10*3/uL — ABNORMAL HIGH (ref 4.0–10.5)
nRBC: 0 % (ref 0.0–0.2)

## 2022-10-25 MED ORDER — SENNOSIDES-DOCUSATE SODIUM 8.6-50 MG PO TABS
1.0000 | ORAL_TABLET | Freq: Two times a day (BID) | ORAL | Status: DC
  Administered 2022-10-25 – 2022-10-28 (×3): 1 via ORAL
  Filled 2022-10-25 (×5): qty 1

## 2022-10-25 MED ORDER — SODIUM ZIRCONIUM CYCLOSILICATE 10 G PO PACK
10.0000 g | PACK | Freq: Once | ORAL | Status: AC
  Administered 2022-10-25: 10 g via ORAL
  Filled 2022-10-25: qty 1

## 2022-10-25 MED ORDER — ORAL CARE MOUTH RINSE: 15.0000 mL | OROMUCOSAL | Status: DC | PRN

## 2022-10-25 NOTE — Evaluation (Signed)
Occupational Therapy Evaluation Patient Details Name: Jacob Reynolds MRN: [id number] DOB: 28-Sep-1965 Today's Date: 10/25/2022   History of Present Illness 57 yo male adm 10/13/22 for SOB and feeling of throat closing. Possible PNA.  Paracentesis 5/3, 5/4, 5/9, 5/13. PMH - stage IV renal cell CA with bone mets and malignant ascites, HTN, depression   Clinical Impression   Pt reports independence at baseline with ADLs and functional mobility, lives alone and per friend, does not have assist, family/friends are working toward providing 24/7 assist at home. Pt currently needing set up - min A for ADLs, minA for bed mobility, and min guard for transfers with RW. VSS on 3L o2 throughout, pt very fatigued after ambulating this AM. Pt presenting with impairments listed below, will follow acutely. If pt able to have 24/7 assist at home recommend HHOT at d/c, if unable may need SNF.      Recommendations for follow up therapy are one component of a multi-disciplinary discharge planning process, led by the attending physician.  Recommendations may be updated based on patient status, additional functional criteria and insurance authorization.   Assistance Recommended at Discharge Frequent or constant Supervision/Assistance  Patient can return home with the following A little help with walking and/or transfers;A lot of help with bathing/dressing/bathroom;Assistance with cooking/housework;Direct supervision/assist for financial management;Direct supervision/assist for medications management;Help with stairs or ramp for entrance;Assist for transportation    Functional Status Assessment  Patient has had a recent decline in their functional status and demonstrates the ability to make significant improvements in function in a reasonable and predictable amount of time.  Equipment Recommendations  BSC/3in1    Recommendations for Other Services PT consult     Precautions / Restrictions Precautions Precautions:  Fall;Other (comment) Precaution Comments: watch sats Restrictions Weight Bearing Restrictions: No      Mobility Bed Mobility Overal bed mobility: Needs Assistance Bed Mobility: Sit to Supine       Sit to supine: Min assist   General bed mobility comments: returning BLE's to bed    Transfers Overall transfer level: Needs assistance Equipment used: Rolling walker (2 wheels) Transfers: Sit to/from Stand Sit to Stand: Min guard                  Balance Overall balance assessment: Needs assistance Sitting-balance support: Feet supported Sitting balance-Leahy Scale: Good     Standing balance support: During functional activity, Reliant on assistive device for balance Standing balance-Leahy Scale: Fair                             ADL either performed or assessed with clinical judgement   ADL Overall ADL's : Needs assistance/impaired Eating/Feeding: Set up;Sitting   Grooming: Min guard;Oral care;Standing   Upper Body Bathing: Minimal assistance;Sitting   Lower Body Bathing: Sitting/lateral leans;Moderate assistance   Upper Body Dressing : Minimal assistance;Sitting   Lower Body Dressing: Minimal assistance;Sitting/lateral leans   Toilet Transfer: Min guard;Ambulation;Regular Toilet;Rolling walker (2 wheels)   Toileting- Clothing Manipulation and Hygiene: Min guard       Functional mobility during ADLs: Min guard;Rolling walker (2 wheels)       Vision   Vision Assessment?: No apparent visual deficits     Perception Perception Perception Tested?: No   Praxis Praxis Praxis tested?: Not tested    Pertinent Vitals/Pain Pain Assessment Pain Assessment: Faces Pain Score: 4  Faces Pain Scale: Hurts little more Pain Location: abdomen and back Pain Descriptors /  Indicators: Discomfort Pain Intervention(s): Monitored during session     Hand Dominance     Extremity/Trunk Assessment Upper Extremity Assessment Upper Extremity Assessment:  Generalized weakness   Lower Extremity Assessment Lower Extremity Assessment: Generalized weakness   Cervical / Trunk Assessment Cervical / Trunk Assessment: Kyphotic   Communication Communication Communication: No difficulties   Cognition Arousal/Alertness: Awake/alert Behavior During Therapy: Flat affect Overall Cognitive Status: Within Functional Limits for tasks assessed                                 General Comments: fatigued after ambulating earlier today, defers most questions to friend that is present in room.     General Comments  VSS on 3L O2    Exercises     Shoulder Instructions      Home Living Family/patient expects to be discharged to:: Private residence Living Arrangements: Alone Available Help at Discharge: Family;Available PRN/intermittently (family is working toward 24/7 assist) Type of Home: House Home Access: Stairs to enter Secretary/administrator of Steps: 2   Home Layout: One level     Bathroom Shower/Tub: Chief Strategy Officer: Standard     Home Equipment: Production assistant, radio - single point   Additional Comments: does not wear O2 baseline      Prior Functioning/Environment Prior Level of Function : Independent/Modified Independent             Mobility Comments: No assistive device. ADLs Comments: No longer drives        OT Problem List: Decreased strength;Decreased range of motion;Impaired balance (sitting and/or standing);Decreased activity tolerance;Decreased cognition;Decreased safety awareness;Cardiopulmonary status limiting activity      OT Treatment/Interventions: Self-care/ADL training;Therapeutic exercise;Energy conservation;DME and/or AE instruction;Therapeutic activities;Patient/family education;Balance training    OT Goals(Current goals can be found in the care plan section) Acute Rehab OT Goals Patient Stated Goal: none stated OT Goal Formulation: With patient Time For Goal Achievement:  11/08/22 Potential to Achieve Goals: Good ADL Goals Pt Will Perform Upper Body Dressing: with modified independence;sitting Pt Will Perform Lower Body Dressing: with modified independence;sitting/lateral leans;sit to/from stand Pt Will Transfer to Toilet: with modified independence;ambulating;regular height toilet Pt Will Perform Tub/Shower Transfer: with modified independence;ambulating;shower seat;Tub transfer;Shower transfer  OT Frequency: Min 1X/week    Co-evaluation              AM-PAC OT "6 Clicks" Daily Activity     Outcome Measure Help from another person eating meals?: A Little Help from another person taking care of personal grooming?: A Little Help from another person toileting, which includes using toliet, bedpan, or urinal?: A Little Help from another person bathing (including washing, rinsing, drying)?: A Lot Help from another person to put on and taking off regular upper body clothing?: A Little Help from another person to put on and taking off regular lower body clothing?: A Little 6 Click Score: 17   End of Session Equipment Utilized During Treatment: Gait belt;Rolling walker (2 wheels);Oxygen (3L) Nurse Communication: Mobility status  Activity Tolerance: Patient tolerated treatment well Patient left: in bed;with call bell/phone within reach;with family/visitor present;with bed alarm set  OT Visit Diagnosis: Unsteadiness on feet (R26.81);Other abnormalities of gait and mobility (R26.89);Muscle weakness (generalized) (M62.81)                Time: 1610-9604 OT Time Calculation (min): 27 min Charges:  OT General Charges $OT Visit: 1 Visit OT Evaluation $OT Eval Moderate Complexity: 1  Mod OT Treatments $Self Care/Home Management : 8-22 mins  Carver Fila, OTD, OTR/L SecureChat Preferred Acute Rehab [phone]  Carver Fila Koonce 10/25/2022, 1:00 PM

## 2022-10-25 NOTE — Progress Notes (Signed)
PROGRESS NOTE    Jacob Reynolds  NWG:[id number] DOB: 08-08-1965 DOA: 10/13/2022 PCP: [name], FNP    Chief Complaint  Patient presents with   Shortness of Breath    Brief Narrative:  Patient is a 57 year old male with HTN, stage IV renal cancer with metastasis to bone presented with feeling of " throat closing up".  He questioned if it was an allergic reaction as he was on several medications. He had not darted belzutifan 120 mg daily he had as he was supposed to start today. Patient just recently hospitalized 4/27-4/29 with intractable nausea, vomiting, and abdominal Per patient, since being home he had increased abdominal swelling, dry mouth.  His throat symptoms improved in the ER however still felt some discomfort.  He previously had thrush.  Also reported nausea, back pain, fatigue, shortness of breath, substernal chest discomfort during the episode. En route with EMS, patient was given epinephrine nebulizer treatment with improvement in his symptoms   In ED, noted to be tachycardia, tachypnea, O2 sats on room air. Sodium 122, CO2 19, BUN 22, creatinine 1.23 X-rays of the neck negative.  Chest x-ray showed diffuse interstitial groundglass opacities, mildly increased, representing edema superimposed on interstitial changes.  VQ scan negative for PE Patient was admitted for further workup.   Significant events 5/3: Underwent paracentesis, 3.9 L removed.  Oncology consulted 5/4: Underwent thoracentesis, left, 520 cc removed.  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  Throat closing/probable CAP -It is noted that on admission patient had presented with acute feeling of throat closing and shortness of breath. -Chest x-ray done with diffuse interstitial groundglass opacities. -V/Q done was negative for PE. -Plain films of the neck done unremarkable. -CT soft tissue neck showed no acute findings, left supraclavicular lymph node increased in size, left pleural effusion decreased. -Patient subsequently noted to have a pleural effusion and underwent thoracentesis with 520 cc of cloudy yellow chylous appearing fluid removed, which was consistent with malignant cells -Some clinical improvement. -PT/OT. -Noted to have been on doxycycline IV Rocephin and has completed a course of doxycycline and IV Rocephin. -Supportive care.  2.  Acute on chronic hyponatremia -Sodium noted at 130 on 10/10/2022 when patient recently discharged. -Sodium at 122 on admission. -Urine sodium < 10, serum osmolality 259, urine osmolality 758, concern for possible SIADH in the setting of patient's metastatic cancer. -Patient started on chemotherapy on 10/15/2022, with Belzutifan. -Per nephrology concern for hypervolemic hyponatremia, also concern for some form of HRS physiology as well as dilutional iso-SIADH. -Sodium level start to trend back down and currently at 123 this morning. -Continue Lasix 20 mg twice daily (per nephrology last saw her for volume overload and more as an adjunct treatment for hyponatremia). -Continue salt tablets. -Urea discontinued per nephrology. -Patient underwent paracentesis 10/20/2022, with plan for weekly LVP's in the outpatient setting. -Status post IV albumin.. -Monitor sodium levels and if worsening in the next 24 hours may need nephrology to reassess again.  3.  Mildly elevated troponin/Abn  2 d echo -Troponins noted to be flattened.  -2D echo with EF of 45 to 50%,  left ventricle with mildly decreased function, LV was not well-visualized but appears abnormal, left ventricular global hypokinesis cardiac MRI may allow better view and quantification of LV systolic function.   -Patient requesting to be seen by cardiology for further evaluation and recommendations on abnormal 2D echo.   -Currently asymptomatic.  -Patient seen in consultation by cardiology, and given his comorbid disease and lack of ischemic symptoms no plan for ischemic evaluation at this time and recommended guideline directed medical therapy with somewhat limited by patient's hypotension. -Per cardiology if blood pressure improves will consider addition of metoprolol and ARB rather than amlodipine which he was previously taking. -Lasix/diuretics being guided by nephrology. -Continue Crestor. -Appreciate cardiology input and recommendations.  4.  Stage IV renal cell carcinoma with mets to the bone/malignant ascites/recurrent hypoalbuminemia/malignant pleural effusion -Patient noted to be followed by Dr. Katragadda/oncology recently completed XRT. -Recent CT with progression of disease with left pleural effusion, ascites, concern for lymphangitic spread. -Patient was supposed to have started on belzutifan on the day of admission. -Status post paracentesis 10/14/2022 with 3.9 L of fluid removed. -Status post paracentesis 10/20/2022 with 1.2 L of fluid removed. -Status post paracentesis 10/24/2022 with 2.4 L of yellow clear fluid removed. -Status post IV albumin.  -Status post left-sided thoracentesis on 5/4, 520 cc of fluid removed with cytology consistent with malignant cells. -Patient started on belzutifan per oncology recommendations during this hospitalization. -Dr.Rai discussed with oncology, Dr. Mosetta Putt, Pleurx and abdomen generally not recommended per IR due to risk of infection. -Dr.Rai also discussed with patient's primary oncologist, Dr. Ellin Saba on 5/ 6 and will arrange outpatient  paracentesis at [facility] weekly. -Patient with abdominal distention again 10/21/2022, patient seen by IR and limited abdominal ultrasound showed a small amount of peritoneal fluid noted and as such paracentesis not performed as patient elected to defer procedure until there was more accumulation.  -Patient underwent paracentesis the morning of 10/24/2022, with 2.4 L of clear yellow fluid removed. -Supportive care. -Consulted palliative care for goals of care.  5.  General debility -Patient notes to be living alone with difficulty getting around. -Continue PT/OT. -Will need home health on discharge.   -Concern for safety as patient lives alone. -May need SNF placement due to concern for safety.  6.  QT prolongation -QTc noted at 690 on presentation. -Avoid QT prolongation medications. -Keep potassium approximately 4, magnesium approximately 2. -Repeat EKG on 10/19/2022 with resolution of QT prolongation.  7.  Hyperlipidemia -Statin.  8.  Hypertension -Norvasc was discontinued due to soft blood pressure.  -Patient currently on oral Lasix per nephrology due to acute on chronic hyponatremia. -BP soft and stable.   9.  Severe protein calorie malnutrition -Albumin noted significantly low at 1.9. -Status post IV albumin.  -Estimated BMI 20.66 kg/m. -Nutritional supplementation.  10.  Nausea vomiting abdominal distention -Patient with nausea and vomiting x 5 the morning of 10/23/2022 with some abdominal distention.   -Patient passing flatus, bowel movement.   -No further nausea or vomiting.   -Abdominal films negative for obstruction or ileus.   -Status post paracentesis the morning on 10/24/2022, with some clinical improvement.   -Patient received enema with good bowel movement. -Tolerating soft diet. -Supportive care.  11.  Hyperkalemia -Repeat potassium still elevated at 5.6. -Lokelma 10 mg p.o. x 1. -Repeat labs in the AM.   DVT prophylaxis: Lovenox Code Status:  Full Family Communication:  Updated patient.  No family at bedside. Disposition: Likely home with home health versus SNF(patient lives alone, concern for safety), when clinically stable with improvement with hyponatremia stabilization of hyponatremia.  Status is: Inpatient Remains inpatient appropriate because: Severity of illness   Consultants:  Nephrology: Dr.Schertz 10/16/2022 Oncology: Dr. Mosetta Putt 10/14/2022 Cardiology: Dr. Duke Salvia 10/20/2022  Procedures:  CT soft tissue neck 10/13/2022 Chest x-ray 10/13/2022, 10/15/2022 Plain films neck 10/13/2022 VQ scan 10/13/2022 2D echo 10/17/2022 Successful right Ig power injectable Port-A-Cath placement per IR: Dr. Deanne Coffer 10/15/2021 Ultrasound-guided therapeutic and diagnostic left-sided paracentesis per IR: Dr. Lowella Dandy 10/14/2022--- 3.9 L of cloudy yellow fluid removed Left-sided guided ultrasound-guided thoracentesis per IR--Dr. Grace Isaac 10/15/2022 with 520 cc of cloudy yellow, chylous appearing fluid. Therapeutic ultrasound-guided paracentesis 1.2 L of cloudy yellow fluid removed per IR: Lynnette Caffey, PA 10/20/2022 Abdominal ultrasound 10/21/2022 Ultrasound-guided paracentesis left lateral abdomen with 2.4 L of yellow clear fluid removed by ZO:XWRUE Andrew Au (10/24/2022)  Antimicrobials:  Anti-infectives (From admission, onward)    Start     Dose/Rate Route Frequency Ordered Stop   10/14/22 2200  doxycycline (VIBRA-TABS) tablet 100 mg        100 mg Oral Every 12 hours 10/14/22 1140 10/18/22 0845   10/13/22 2200  doxycycline (VIBRAMYCIN) 100 mg in sodium chloride 0.9 % 250 mL IVPB  Status:  Discontinued        100 mg 125 mL/hr over 120 Minutes Intravenous Every 12 hours 10/13/22 2050 10/14/22 1140   10/13/22 2100  cefTRIAXone (ROCEPHIN) 2 g in sodium chloride 0.9 % 100 mL IVPB  Status:  Discontinued        2 g 200 mL/hr over 30 Minutes Intravenous Every 24 hours 10/13/22 2048 10/22/22 0834         Subjective: Patient sitting up in recliner.   Friend at bedside.  Patient denies any chest pain.  No significant shortness of breath.  Feels abdominal distention at low rate better.  States had good bowel movement after enema yesterday.  Tolerating oral intake.  No further vomiting.   Objective: Vitals:   10/24/22 2311 10/25/22 0402 10/25/22 0737 10/25/22 0850  BP: 113/83 106/81 105/80   Pulse: 100 91 95 97  Resp: 20 20 20  (!) 23  Temp: 98 F (36.7 C) 97.6 F (36.4 C) 97.7 F (36.5 C)   TempSrc: Oral Oral Oral   SpO2: 91% 90% 90% 93%  Weight:      Height:        Intake/Output Summary (Last 24 hours) at 10/25/2022 1100 Last data filed at 10/25/2022 0938 Gross per 24 hour  Intake 60 ml  Output --  Net 60 ml    Filed Weights   10/13/22 1306  Weight: 67.2 kg    Examination:  General exam: NAD. Respiratory system: Lungs clear to auscultation bilaterally.  No wheezes, no crackles, no rhonchi.  Fair air movement.  Speaking in full sentences.   Cardiovascular system: Regular rate rhythm no murmurs rubs or gallops.  No JVD.  No lower extremity edema.   Gastrointestinal system: Abdomen distended, less tight, soft, positive bowel sounds.  No rebound.  No guarding.  Central nervous system: Alert and oriented. No focal neurological deficits. Extremities: Symmetric 5 x 5 power. Skin: No rashes, lesions or ulcers Psychiatry: Judgement and insight appear normal. Mood & affect appropriate.     Data Reviewed: I have personally reviewed following labs and imaging studies  CBC: Recent Labs  Lab 10/20/22 0240 10/21/22 0010 10/22/22 0326 10/24/22 4540  10/25/22 0400  WBC 8.2 8.4 9.5 10.1 10.9*  NEUTROABS 7.0  --   --   --   --   HGB 12.2* 11.5* 11.8* 12.3* 11.6*  HCT 35.6* 34.8* 35.7* 37.2* 35.2*  MCV 82.2 84.7 83.6 84.0 84.0  PLT 214 221 240 270 219     Basic Metabolic Panel: Recent Labs  Lab 10/19/22 0330 10/20/22 0240 10/21/22 0010 10/22/22 0326 10/23/22 0320 10/24/22 0415 10/25/22 0400 10/25/22 0750  NA 122*  123* 124* 126* 126* 124* 123*  --   K 4.3 4.8 4.6 4.5 5.0 5.1 5.3* 5.6*  CL 86* 87* 88* 88* 87* 91* 87*  --   CO2 24 24 25 26 24 22 23   --   GLUCOSE 97 109* 104* 106* 121* 100* 93  --   BUN 30* 30* 41* 37* 35* 37* 39*  --   CREATININE 0.73 0.70 0.76 0.74 0.86 0.93 1.02  --   CALCIUM 8.5* 8.9 8.8* 9.3 9.7 9.1 9.8  --   MG 2.1 1.8 2.3 2.1  --  2.1  --   --      GFR: Estimated Creatinine Clearance: 76.9 mL/min (by C-G formula based on SCr of 1.02 mg/dL).  Liver Function Tests: Recent Labs  Lab 10/19/22 0330  AST 35  ALT 26  ALKPHOS 232*  BILITOT 0.5  PROT 5.7*  ALBUMIN 2.7*     CBG: Recent Labs  Lab 10/24/22 1708  GLUCAP 98     Recent Results (from the past 240 hour(s))  Body fluid culture w Gram Stain     Status: None   Collection Time: 10/15/22  1:32 PM   Specimen: PATH Cytology Pleural fluid  Result Value Ref Range Status   Specimen Description PLEURAL  Final   Special Requests NONE  Final   Gram Stain   Final    RARE WBC PRESENT,BOTH PMN AND MONONUCLEAR NO ORGANISMS SEEN    Culture   Final    NO GROWTH 3 DAYS Performed at [facility], [address], [place], [place] [address]    Report Status 10/18/2022 FINAL  Final         Radiology Studies: IR Paracentesis  Result Date: 10/24/2022 INDICATION: Patient with metastatic renal cell carcinoma with recurrent ascites. Request made for therapeutic paracentesis. EXAM: ULTRASOUND GUIDED THERAPEUTIC PARACENTESIS MEDICATIONS: 10 mL 1% lidocaine COMPLICATIONS: None immediate. PROCEDURE: Informed written consent was obtained from the patient after a discussion of the risks, benefits and alternatives to treatment. A timeout was performed prior to the initiation of the procedure. Initial ultrasound scanning demonstrates a small amount of ascites within the right lower abdominal quadrant. The right lower abdomen was prepped and draped in the usual sterile fashion. 1% lidocaine was used for local anesthesia.  Following this, a 19 gauge, 7-cm, Yueh catheter was introduced. An ultrasound image was saved for documentation purposes. The paracentesis was performed. The catheter was removed and a dressing was applied. The patient tolerated the procedure well without immediate post procedural complication. FINDINGS: A total of approximately 2.4 liters of clear, yellow fluid was removed. IMPRESSION: Successful ultrasound-guided paracentesis yielding 2.4 liters of peritoneal fluid. Read by: Loyce Dys PA-C Electronically Signed   By: Corlis Leak M.D.   On: 10/24/2022 10:01   DG Abd Portable 1V  Result Date: 10/23/2022 CLINICAL DATA:  Nausea and vomiting. EXAM: PORTABLE ABDOMEN - 1 VIEW COMPARISON:  Abdominal CT 10/08/2022 FINDINGS: There is colonic distension, stool within the ascending and splenic flexure of  the colon. Air within the transverse colon. There is stool distending the rectum. No evidence of small bowel distension or obstruction. Calcifications in the pelvis typical of phleboliths. Left pleural effusion is noted. IMPRESSION: 1. Mild colonic distension with stool and air. No small-bowel distention or evidence of obstruction. 2. Left pleural effusion. Electronically Signed   By: Narda Rutherford M.D.   On: 10/23/2022 17:30        Scheduled Meds:  belzutifan  120 mg Oral Daily   Chlorhexidine Gluconate Cloth  6 each Topical Daily   enoxaparin (LOVENOX) injection  40 mg Subcutaneous Q24H   feeding supplement  237 mL Oral BID BM   fluticasone furoate-vilanterol  1 puff Inhalation Daily   furosemide  20 mg Oral BID   lidocaine  2 patch Transdermal Q24H   magnesium oxide  400 mg Oral Daily   pantoprazole  40 mg Oral Q0600   rosuvastatin  5 mg Oral Daily   senna-docusate  1 tablet Oral BID   simethicone  160 mg Oral QID   sodium chloride flush  10-40 mL Intracatheter Q12H   sodium chloride flush  3 mL Intravenous Q12H   sodium chloride  2 g Oral BID WC   sodium zirconium cyclosilicate  10 g Oral  Once   umeclidinium bromide  1 puff Inhalation Daily   Continuous Infusions:     LOS: 12 days    Time spent: 35 minutes    Ramiro Harvest, MD [facility]   To contact the attending provider between 7A-7P or the covering provider during after hours 7P-7A, please log into the web site [email or web address] and access using universal [place] password for that web site. If you do not have the password, please call the hospital operator.  10/25/2022, 11:00 AM

## 2022-10-25 NOTE — Progress Notes (Signed)
Mobility Specialist Progress Note:    10/25/22 1603  Mobility  Activity Ambulated with assistance in hallway  Level of Assistance Minimal assist, patient does 75% or more  Assistive Device Front wheel walker  Distance Ambulated (ft) 50 ft  Activity Response Tolerated fair  Mobility Referral Yes  $Mobility charge 1 Mobility  Mobility Specialist Start Time (ACUTE ONLY) 1548  Mobility Specialist Stop Time (ACUTE ONLY) 1603  Mobility Specialist Time Calculation (min) (ACUTE ONLY) 15 min   Pt received in bed, agreeable to ambulate. Pt ambulated on 6L O2, SPO2 WFL.Pt c/o 8/10 left foot pain. Pt assisted to chair with call bell and necessities at hand.    Pre Mobility SPO2 88% During Mobility SPO2 86% Post Mobility SPO2 94% BP 114/82  Thompson Grayer Mobility Specialist  Please contact vis Secure Chat or  Rehab Office [phone]

## 2022-10-25 NOTE — Evaluation (Signed)
Physical Therapy Evaluation Patient Details Name: Jacob Reynolds MRN: [id number] DOB: 06-25-1965 Today's Date: 10/25/2022  History of Present Illness  57 yo male adm 10/13/22 for SOB and feeling of throat closing. Possible PNA.  Paracentesis 5/3, 5/4, 5/9, 5/13. PMH - stage IV renal cell CA with bone mets and malignant ascites, HTN, depression  Clinical Impression  Pt pleasant and reports decline in function and strength since last seen by P.T. 5/8. Pt reports he has been moving around and has HEP. Pt plan to return home with assist remains appropriate. Pt currently requires supplemental O2 to maintain sats even at rest. 85% on RA at rest, 92% on 3L at rest and 94% on 5L with gait. Pt would also benefit from rollator for energy conservation. Pt with decreased activity tolerance and pulmonary function who will benefit from acute therapy to maximize independence.         Recommendations for follow up therapy are one component of a multi-disciplinary discharge planning process, led by the attending physician.  Recommendations may be updated based on patient status, additional functional criteria and insurance authorization.  Follow Up Recommendations       Assistance Recommended at Discharge PRN  Patient can return home with the following  Assist for transportation    Equipment Recommendations Rollator (4 wheels)  Recommendations for Other Services       Functional Status Assessment Patient has had a recent decline in their functional status and/or demonstrates limited ability to make significant improvements in function in a reasonable and predictable amount of time     Precautions / Restrictions Precautions Precautions: Fall;Other (comment) Precaution Comments: watch sats Restrictions Weight Bearing Restrictions: No      Mobility  Bed Mobility Overal bed mobility: Modified Independent             General bed mobility comments: HOB 35 degrees    Transfers Overall  transfer level: Modified independent                      Ambulation/Gait Ambulation/Gait assistance: Min guard Gait Distance (Feet): 250 Feet Assistive device: Rolling walker (2 wheels) Gait Pattern/deviations: Step-through pattern, Decreased stride length   Gait velocity interpretation: 1.31 - 2.62 ft/sec, indicative of limited community ambulator   General Gait Details: cues for direction with need for 5L to maintain sats and 3 standing rest breaks during gait  Stairs            Wheelchair Mobility    Modified Rankin (Stroke Patients Only)       Balance Overall balance assessment: Mild deficits observed, not formally tested                                           Pertinent Vitals/Pain Pain Assessment Pain Assessment: No/denies pain    Home Living Family/patient expects to be discharged to:: Private residence Living Arrangements: Alone Available Help at Discharge: Family;Available PRN/intermittently Type of Home: House Home Access: Stairs to enter   [employer] of Steps: 2   Home Layout: One level Home Equipment: None      Prior Function Prior Level of Function : Independent/Modified Independent               ADLs Comments: No longer drives     Hand Dominance        Extremity/Trunk Assessment   Upper Extremity Assessment Upper  Extremity Assessment: Generalized weakness    Lower Extremity Assessment Lower Extremity Assessment: Generalized weakness    Cervical / Trunk Assessment Cervical / Trunk Assessment: Kyphotic  Communication   Communication: No difficulties  Cognition Arousal/Alertness: Awake/alert Behavior During Therapy: Flat affect Overall Cognitive Status: Within Functional Limits for tasks assessed                                          General Comments      Exercises     Assessment/Plan    PT Assessment Patient needs continued PT services  PT Problem List  Decreased strength;Decreased mobility;Decreased knowledge of use of DME;Cardiopulmonary status limiting activity       PT Treatment Interventions DME instruction;Gait training;Stair training;Functional mobility training;Therapeutic activities;Patient/family education    PT Goals (Current goals can be found in the Care Plan section)  Acute Rehab PT Goals Patient Stated Goal: return home and take care of myself PT Goal Formulation: With patient Time For Goal Achievement: 11/08/22 Potential to Achieve Goals: Good    Frequency Min 1X/week     Co-evaluation               AM-PAC PT "6 Clicks" Mobility  Outcome Measure Help needed turning from your back to your side while in a flat bed without using bedrails?: None Help needed moving from lying on your back to sitting on the side of a flat bed without using bedrails?: None Help needed moving to and from a bed to a chair (including a wheelchair)?: None Help needed standing up from a chair using your arms (e.g., wheelchair or bedside chair)?: A Little Help needed to walk in hospital room?: A Little Help needed climbing 3-5 steps with a railing? : A Little 6 Click Score: 21    End of Session Equipment Utilized During Treatment: Oxygen Activity Tolerance: Patient tolerated treatment well Patient left: in chair;with call bell/phone within reach Nurse Communication: Mobility status PT Visit Diagnosis: Muscle weakness (generalized) (M62.81);Other abnormalities of gait and mobility (R26.89)    Time: 5621-3086 PT Time Calculation (min) (ACUTE ONLY): 22 min   Charges:   PT Evaluation $PT Eval Moderate Complexity: 1 Mod          Haeden Hudock P, PT Acute Rehabilitation Services Office: [phone]   Enedina Finner Shondell Fabel 10/25/2022, 10:42 AM

## 2022-10-25 NOTE — TOC Progression Note (Signed)
Transition of Care [facility]) - Progression Note    Patient Details  Name: ARISTIDE SHEERIN MRN: [id number] Date of Birth: [birth year]  Transition of Care [facility]) CM/SW Contact  Carley Hammed, LCSW Phone Number: 10/25/2022, 3:50 PM  Clinical Narrative:    CSW was advised by [facility] that pt is requesting SNF, as he is not ready to go home alone. CSW reviewed PT note, recommendation for [facility]. Pt is not currently meeting criteria to be a good SNF candidate. CSW called pt's insurance and was advised that if approved, they will only pay for 50%, patient will be responsible for the other 50%. Most facilities will expect the difference up front. CSW updated medical team to barriers, CSW will not pursue SNF at this time.    Expected Discharge Plan: Home w Home Health Services Barriers to Discharge: Continued Medical Work up  Expected Discharge Plan and Services   Discharge Planning Services: CM Consult   Living arrangements for the past 2 months: Single Family Home                 DME Arranged:  (Await PT recommendations)         HH Arranged: PT HH Agency: [facility] Date HH Agency Contacted: 10/17/22 Time HH Agency Contacted: 1257 Representative spoke with at [facility] Agency: Amy   Social Determinants of Health (SDOH) Interventions SDOH Screenings   Depression (PHQ2-9): Medium Risk (08/08/2022)  Tobacco Use: High Risk (10/24/2022)    Readmission Risk Interventions     No data to display

## 2022-10-25 NOTE — TOC Progression Note (Signed)
Transition of Care [facility]) - Progression Note    Patient Details  Name: Jacob Reynolds MRN: [id number] Date of Birth: 1966/01/21  Transition of Care [facility]) CM/SW Contact  Harriet Masson, RN Phone Number: 10/25/2022, 3:30 PM  Clinical Narrative:    Patient is agreeable for information being sent to SNF. CSW is aware.    Expected Discharge Plan: Home w Home Health Services Barriers to Discharge: Continued Medical Work up  Expected Discharge Plan and Services   Discharge Planning Services: CM Consult   Living arrangements for the past 2 months: Single Family Home                 DME Arranged:  (Await PT recommendations)         HH Arranged: PT HH Agency: [facility] Date HH Agency Contacted: 10/17/22 Time HH Agency Contacted: 1257 Representative spoke with at [facility] Agency: Amy   Social Determinants of Health (SDOH) Interventions SDOH Screenings   Depression (PHQ2-9): Medium Risk (08/08/2022)  Tobacco Use: High Risk (10/24/2022)    Readmission Risk Interventions     No data to display

## 2022-10-25 NOTE — Progress Notes (Signed)
SATURATION QUALIFICATIONS: (This note is used to comply with regulatory documentation for home oxygen)  Patient Saturations on Room Air at Rest = 85%  Patient Saturations on 3L at rest = 92%  Patient Saturations on 5 Liters of oxygen while Ambulating = 94%  Please briefly explain why patient needs home oxygen: Pt currently requiring 3L at rest and 5L with activity to maintain SPO2 >90%  Bayler Nehring P, PT Acute Rehabilitation Services Office: [phone]

## 2022-10-26 DIAGNOSIS — R0602 Shortness of breath: Secondary | ICD-10-CM | POA: Diagnosis not present

## 2022-10-26 DIAGNOSIS — R0609 Other forms of dyspnea: Secondary | ICD-10-CM | POA: Diagnosis not present

## 2022-10-26 DIAGNOSIS — E871 Hypo-osmolality and hyponatremia: Secondary | ICD-10-CM | POA: Diagnosis not present

## 2022-10-26 DIAGNOSIS — J189 Pneumonia, unspecified organism: Secondary | ICD-10-CM | POA: Diagnosis not present

## 2022-10-26 LAB — BASIC METABOLIC PANEL
Anion gap: 15 (ref 5–15)
BUN: 46 mg/dL — ABNORMAL HIGH (ref 6–20)
CO2: 22 mmol/L (ref 22–32)
Calcium: 9.9 mg/dL (ref 8.9–10.3)
Chloride: 83 mmol/L — ABNORMAL LOW (ref 98–111)
Creatinine, Ser: 1.2 mg/dL (ref 0.61–1.24)
GFR, Estimated: >60 mL/min (ref >60)
Glucose, Bld: 97 mg/dL (ref 70–99)
Potassium: 5.7 mmol/L — ABNORMAL HIGH (ref 3.5–5.1)
Sodium: 120 mmol/L — ABNORMAL LOW (ref 135–145)

## 2022-10-26 LAB — CBC
HCT: 36.2 % — ABNORMAL LOW (ref 39.0–52.0)
Hemoglobin: 12.1 g/dL — ABNORMAL LOW (ref 13.0–17.0)
MCH: 28.1 pg (ref 26.0–34.0)
MCHC: 33.4 g/dL (ref 30.0–36.0)
MCV: 84.2 fL (ref 80.0–100.0)
Platelets: 214 10*3/uL (ref 150–400)
RBC: 4.3 MIL/uL (ref 4.22–5.81)
RDW: 23.6 % — ABNORMAL HIGH (ref 11.5–15.5)
WBC: 12.3 10*3/uL — ABNORMAL HIGH (ref 4.0–10.5)
nRBC: 0 % (ref 0.0–0.2)

## 2022-10-26 LAB — MAGNESIUM: Magnesium: 2.3 mg/dL (ref 1.7–2.4)

## 2022-10-26 MED ORDER — SODIUM ZIRCONIUM CYCLOSILICATE 10 G PO PACK
10.0000 g | PACK | Freq: Two times a day (BID) | ORAL | Status: AC
  Administered 2022-10-26 (×2): 10 g via ORAL
  Filled 2022-10-26 (×2): qty 1

## 2022-10-26 MED ORDER — MIDODRINE HCL 5 MG PO TABS
5.0000 mg | ORAL_TABLET | Freq: Three times a day (TID) | ORAL | Status: DC
  Administered 2022-10-26 – 2022-10-28 (×6): 5 mg via ORAL
  Filled 2022-10-26 (×7): qty 1

## 2022-10-26 MED ORDER — SODIUM ZIRCONIUM CYCLOSILICATE 10 G PO PACK
10.0000 g | PACK | Freq: Once | ORAL | Status: AC
  Administered 2022-10-26: 10 g via ORAL
  Filled 2022-10-26: qty 1

## 2022-10-26 MED ORDER — TOLVAPTAN 15 MG PO TABS
15.0000 mg | ORAL_TABLET | Freq: Once | ORAL | Status: DC
Start: 2022-10-26 — End: 2022-10-26

## 2022-10-26 MED ORDER — FUROSEMIDE 10 MG/ML IJ SOLN
40.0000 mg | Freq: Two times a day (BID) | INTRAMUSCULAR | Status: DC
  Administered 2022-10-26 – 2022-10-27 (×2): 40 mg via INTRAVENOUS
  Filled 2022-10-26 (×2): qty 4

## 2022-10-26 NOTE — Progress Notes (Signed)
Mobility Specialist: Progress Note   10/26/22 1534  Mobility  Activity Refused mobility  Mobility Specialist Start Time (ACUTE ONLY) 1529  Mobility Specialist Stop Time (ACUTE ONLY) 1534  Mobility Specialist Time Calculation (min) (ACUTE ONLY) 5 min   Pt refused mobility secondary to LLE groin pain and SOB. VSS. Will f/u as able.   Kerstin Crusoe Mobility Specialist Please contact via SecureChat or Rehab office at [phone]

## 2022-10-26 NOTE — Progress Notes (Signed)
PT Cancellation Note  Patient Details Name: Jacob Reynolds MRN: [id number] DOB: Apr 29, 1966   Cancelled Treatment:    Reason Eval/Treat Not Completed: (P) Other (comment) Pt just worked with OT. Although pt is willing he lacks energy reserve at this time to effectively participate with PT. PT will follow back later today for therapy session.  Mikhia Dusek B. Beverely Risen PT, DPT Acute Rehabilitation Services Please use secure chat or  Call Office [phone]    Elon Alas [facility] 10/26/2022, 9:20 AM

## 2022-10-26 NOTE — TOC Progression Note (Addendum)
Transition of Care [facility]) - Progression Note    Patient Details  Name: Jacob Reynolds MRN: [id number] Date of Birth: 08/16/1965  Transition of [facility]) CM/SW Contact  Carley Hammed, LCSW Phone Number: 10/26/2022, 12:28 PM  Clinical Narrative:    CSW notified that pt had a decline since last therapy visit and is much closer to being appropriate for SNF. CSW met with pt at bedside to assess. Pt was advised that his insurance will only cover 50% and that the rest would be out of pocket. Pt states he is not sure if he can, but he will check his finances and let CSW know if that is something he can do. He is agreeable to CSW completing paperwork and sending him out for bed offers. CSW to follow up with pt tomorrow to determine if he is financially able to afford SNF. TOC will continue to follow for DC needs.   [id number]  Expected Discharge Plan: Home w Home Health Services Barriers to Discharge: Continued Medical Work up  Expected Discharge Plan and Services   Discharge Planning Services: CM Consult   Living arrangements for the past 2 months: Single Family Home                 DME Arranged:  (Await PT recommendations)         HH Arranged: PT HH Agency: [facility] Date [facility] Agency Contacted: 10/17/22 Time HH Agency Contacted: 1257 Representative spoke with at [facility] Agency: Amy   Social Determinants of Health (SDOH) Interventions SDOH Screenings   Depression (PHQ2-9): Medium Risk (08/08/2022)  Tobacco Use: High Risk (10/24/2022)    Readmission Risk Interventions     No data to display

## 2022-10-26 NOTE — Progress Notes (Addendum)
[facility] Progress Note   Assessment/ Plan:   Hypervolemic hyponatremia: In the setting of volume overload with low urine sodium. Asymptomatic currently. Likely some form of HRS physiology as well as possible heart failure contributing.   - start midodrine 5 mg TID -Continue salt tabs 2 g twice daily for now- I think this is contributing to his increased thirst so would like to get them off those at some point if we can - increase Lasix to 40 mg IV BID - check cortisol -? Adrenal insufficiency, hyponatremia/ hyperkalemia/ hypotension - consider urena vs samsca    Recurrent ascites: Status post paracentesis on 5/3 and 5/9.  5/13   Metastatic cancer: Renal cell.  Management per oncology, started chemo   HFrEF: cardiology following.  Evidence-based medicine limited by hypotension.    Subjective:    Re consult for hyponatremia.  Hypervolemic hyponatremia, treated with salt tabs, Lasix, urena.    Now re-consult for worsening Na, buildup of ascites.  Needs paracentesis every 2-3 days.  Also pt is hyperkalemic.  Known metastatic RCC, has a large L adrenal mass.    Pt reports being thirsty and drinking too much beyond his fluid restriction   Objective:   BP 94/72 (BP Location: Right Arm)   Pulse 92   Temp 97.6 F (36.4 C) (Oral)   Resp 19   Ht 5\' 11"  (1.803 m)   Wt 67.2 kg   SpO2 93%   BMI 20.66 kg/m   Intake/Output Summary (Last 24 hours) at 10/26/2022 1159 Last data filed at 10/26/2022 1100 Gross per 24 hour  Intake 692 ml  Output 100 ml  Net 592 ml   Weight change:   Physical Exam: ZOX:WRUEA, NAD CVS: RRR Resp: clear Abd: + distended Ext: no LE edema  Imaging: No results found.  Labs: BMET Recent Labs  Lab 10/20/22 0240 10/21/22 0010 10/22/22 0326 10/23/22 0320 10/24/22 0415 10/25/22 0400 10/25/22 0750 10/26/22 0330  NA 123* 124* 126* 126* 124* 123*  --  120*  K 4.8 4.6 4.5 5.0 5.1 5.3* 5.6* 5.7*  CL 87* 88* 88* 87* 91* 87*  --  83*   CO2 24 25 26 24 22 23   --  22  GLUCOSE 109* 104* 106* 121* 100* 93  --  97  BUN 30* 41* 37* 35* 37* 39*  --  46*  CREATININE 0.70 0.76 0.74 0.86 0.93 1.02  --  1.20  CALCIUM 8.9 8.8* 9.3 9.7 9.1 9.8  --  9.9   CBC Recent Labs  Lab 10/20/22 0240 10/21/22 0010 10/22/22 0326 10/24/22 0415 10/25/22 0400 10/26/22 0330  WBC 8.2   < > 9.5 10.1 10.9* 12.3*  NEUTROABS 7.0  --   --   --   --   --   HGB 12.2*   < > 11.8* 12.3* 11.6* 12.1*  HCT 35.6*   < > 35.7* 37.2* 35.2* 36.2*  MCV 82.2   < > 83.6 84.0 84.0 84.2  PLT 214   < > 240 270 219 214   < > = values in this interval not displayed.    Medications:     belzutifan  120 mg Oral Daily   Chlorhexidine Gluconate Cloth  6 each Topical Daily   enoxaparin (LOVENOX) injection  40 mg Subcutaneous Q24H   feeding supplement  237 mL Oral BID BM   fluticasone furoate-vilanterol  1 puff Inhalation Daily   furosemide  20 mg Oral BID   lidocaine  2 patch Transdermal Q24H  magnesium oxide  400 mg Oral Daily   pantoprazole  40 mg Oral Q0600   rosuvastatin  5 mg Oral Daily   senna-docusate  1 tablet Oral BID   simethicone  160 mg Oral QID   sodium chloride flush  10-40 mL Intracatheter Q12H   sodium chloride flush  3 mL Intravenous Q12H   sodium chloride  2 g Oral BID WC   sodium zirconium cyclosilicate  10 g Oral BID   umeclidinium bromide  1 puff Inhalation Daily    Bufford Buttner MD 10/26/2022, 11:59 AM

## 2022-10-26 NOTE — NC FL2 (Signed)
[place] MEDICAID FL2 LEVEL OF CARE FORM     IDENTIFICATION  Patient Name: Jacob Reynolds Birthdate: [birth year] Sex: male Admission Date (Current Location): 10/13/2022  [facility] and [place] Number:  Producer, television/film/video and Address:  [facility], [address], [place], [place] [address]      Provider Number: [id number]  Attending Physician Name and Address:  Cathren Harsh, MD  Relative Name and Phone Number:  Tywaun Troup [phone]    Current Level of Care: Hospital Recommended Level of Care: Skilled Nursing Facility Prior Approval Number:    Date Approved/Denied:   PASRR Number: (PASRR Down)  Discharge Plan: SNF    Current Diagnoses: Patient Active Problem List   Diagnosis Date Noted   HFrEF (heart failure with reduced ejection fraction) (HCC) 10/20/2022   SOB (shortness of breath) 10/13/2022   CAP (community acquired pneumonia) 10/13/2022   Generalized weakness 10/13/2022   Elevated troponin 10/13/2022   Prolonged QT interval 10/13/2022   Hypocalcemia 10/13/2022   Protein-calorie malnutrition, severe (HCC) 10/13/2022   Essential hypertension 10/08/2022   Intractable nausea and vomiting 10/08/2022   Ascites, malignant 10/08/2022   Cancer-related pain 10/08/2022   Hyponatremia 10/08/2022   Hypokalemia 10/04/2022   Mixed hyperlipidemia 08/08/2022   Tobacco abuse 08/08/2022   Port-A-Cath in place 11/10/2021   Metastatic renal cell carcinoma to bone (HCC) 11/04/2021   Aortic atherosclerosis (HCC) 09/07/2021   Pulmonary emphysema (HCC) 09/07/2021   Depression, major, single episode, mild (HCC) 08/05/2021   Foot pain, right 07/27/2021   Callus 02/01/2021   Calcaneal spur of left foot 02/01/2021   Generalized anxiety disorder 02/01/2021   Cervical radiculopathy 02/01/2021    Orientation RESPIRATION BLADDER Height & Weight     Self, Time, Situation, Place  O2 ([place] 4L) Continent Weight: 148 lb 2.4 oz (67.2 kg) Height:  5\' 11"   (180.3 cm)  BEHAVIORAL SYMPTOMS/MOOD NEUROLOGICAL BOWEL NUTRITION STATUS      Continent Diet (See DC summary)  AMBULATORY STATUS COMMUNICATION OF NEEDS Skin   Supervision Verbally Normal                       Personal Care Assistance Level of Assistance  Bathing, Feeding, Dressing Bathing Assistance: Limited assistance Feeding assistance: Independent Dressing Assistance: Limited assistance     Functional Limitations Info  Sight, Hearing, Speech Sight Info: Adequate Hearing Info: Adequate Speech Info: Adequate    SPECIAL CARE FACTORS FREQUENCY  PT (By licensed PT), OT (By licensed OT)     PT Frequency: 5x week OT Frequency: 5x week            Contractures Contractures Info: Not present    Additional Factors Info  Code Status, Allergies Code Status Info: Full Allergies Info: Iodinated Contrast Media  Iodine           Current Medications (10/26/2022):  This is the current hospital active medication list Current Facility-Administered Medications  Medication Dose Route Frequency Provider Last Rate Last Admin   acetaminophen (TYLENOL) tablet 650 mg  650 mg Oral Q6H PRN Madelyn Flavors A, MD   325 mg at 10/21/22 1534   Or   acetaminophen (TYLENOL) suppository 650 mg  650 mg Rectal Q6H PRN Madelyn Flavors A, MD       albuterol (PROVENTIL) (2.5 MG/3ML) 0.083% nebulizer solution 2.5 mg  2.5 mg Nebulization Q2H PRN Clydie Braun, MD       belzutifan [facility]) tablet 120 mg  120 mg Oral  Daily Malachy Mood, MD   120 mg at 10/25/22 1418   Chlorhexidine Gluconate Cloth 2 % PADS 6 each  6 each Topical Daily Madelyn Flavors A, MD   6 each at 10/26/22 0956   enoxaparin (LOVENOX) injection 40 mg  40 mg Subcutaneous Q24H Madelyn Flavors A, MD   40 mg at 10/26/22 0733   EPINEPHrine (EPI-PEN) injection 0.3 mg  0.3 mg Intramuscular Once PRN Tilden Fossa, MD       feeding supplement (ENSURE ENLIVE / ENSURE PLUS) liquid 237 mL  237 mL Oral BID BM Opyd, Lavone Neri, MD   237 mL at  10/21/22 1549   fluticasone furoate-vilanterol (BREO ELLIPTA) 200-25 MCG/ACT 1 puff  1 puff Inhalation Daily Rai, Ripudeep K, MD   1 puff at 10/26/22 0739   furosemide (LASIX) injection 40 mg  40 mg Intravenous BID Bufford Buttner, MD   40 mg at 10/26/22 1223   HYDROmorphone (DILAUDID) tablet 2 mg  2 mg Oral Q6H PRN Madelyn Flavors A, MD   2 mg at 10/26/22 0057   lidocaine (LIDODERM) 5 % 2 patch  2 patch Transdermal Q24H Madelyn Flavors A, MD   2 patch at 10/26/22 1118   lidocaine-prilocaine (EMLA) cream   Topical PRN Hillary Bow, DO       loperamide (IMODIUM) capsule 2 mg  2 mg Oral PRN Madelyn Flavors A, MD       magnesium oxide (MAG-OX) tablet 400 mg  400 mg Oral Daily Rai, Ripudeep K, MD   400 mg at 10/26/22 0953   midodrine (PROAMATINE) tablet 5 mg  5 mg Oral TID WC Bufford Buttner, MD   5 mg at 10/26/22 1224   Oral care mouth rinse  15 mL Mouth Rinse PRN Rodolph Bong, MD       pantoprazole (PROTONIX) EC tablet 40 mg  40 mg Oral Q0600 Rodolph Bong, MD   40 mg at 10/25/22 0541   prochlorperazine (COMPAZINE) injection 5 mg  5 mg Intravenous Q6H PRN Rodolph Bong, MD       rosuvastatin (CRESTOR) tablet 5 mg  5 mg Oral Daily Madelyn Flavors A, MD   5 mg at 10/26/22 4098   senna-docusate (Senokot-S) tablet 1 tablet  1 tablet Oral BID Rodolph Bong, MD   1 tablet at 10/26/22 0953   simethicone (MYLICON) chewable tablet 160 mg  160 mg Oral QID Rodolph Bong, MD   160 mg at 10/26/22 0953   sodium chloride flush (NS) 0.9 % injection 10-40 mL  10-40 mL Intracatheter Q12H Elynor Kallenberger, Rondell A, MD   10 mL at 10/26/22 0956   sodium chloride flush (NS) 0.9 % injection 10-40 mL  10-40 mL Intracatheter PRN Madelyn Flavors A, MD   10 mL at 10/25/22 0541   sodium chloride flush (NS) 0.9 % injection 3 mL  3 mL Intravenous Q12H Damir Leung, Rondell A, MD   3 mL at 10/26/22 0956   sodium chloride tablet 2 g  2 g Oral BID WC [name], MD   2 g at 10/26/22 0644   sodium zirconium  cyclosilicate (LOKELMA) packet 10 g  10 g Oral BID Bufford Buttner, MD   10 g at 10/26/22 1224   trimethobenzamide (TIGAN) injection 200 mg  200 mg Intramuscular Q6H PRN Julliette Frentz, Rondell A, MD       umeclidinium bromide (INCRUSE ELLIPTA) 62.5 MCG/ACT 1 puff  1 puff Inhalation Daily Rai, Ripudeep K, MD   1 puff at  10/26/22 0740   Facility-Administered Medications Ordered in Other Encounters  Medication Dose Route Frequency Provider Last Rate Last Admin   diphenhydrAMINE (BENADRYL) 50 MG/ML injection            famotidine (PEPCID) 20-0.9 MG/50ML-% IVPB              Discharge Medications: Please see discharge summary for a list of discharge medications.  Relevant Imaging Results:  Relevant Lab Results:   Additional Information SS# [id number], [place]

## 2022-10-26 NOTE — Progress Notes (Signed)
Physical Therapy Treatment Patient Details Name: Jacob Reynolds MRN: [id number] DOB: 12-Apr-1966 Today's Date: 10/26/2022   History of Present Illness 57 yo male adm 10/13/22 for SOB and feeling of throat closing. Possible PNA.  Paracentesis 5/3, 5/4, 5/9, 5/13. 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 Over the past week it has become evident that he is able to perform very well with the first therapist to see him in the morning but then becomes so fatigued that he can barely participate during additional therapy sessions. Today, pt is only able to walk into bathroom and back to bed with PT after working with OT earlier this morning. PT spent increased time educating on energy conservation techniques. As a result of pt's decreased stamina and ability to perform iADLs PT recommending continued inpatient follow up therapy, <3 hours/day.    Recommendations for follow up therapy are one component of a multi-disciplinary discharge planning process, led by the attending physician.  Recommendations may be updated based on patient status, additional functional criteria and insurance authorization.  Follow Up Recommendations  Can patient physically be transported by private vehicle: Yes    Assistance Recommended at Discharge Frequent or constant Supervision/Assistance  Patient can return home with the following Assistance with cooking/housework;Assist for transportation;Help with stairs or ramp for entrance   Equipment Recommendations  Rollator (4 wheels)       Precautions / Restrictions Precautions Precautions: Fall;Other (comment) Precaution Comments: watch sats Restrictions Weight Bearing Restrictions: No     Mobility  Bed Mobility Overal bed mobility: Needs  Assistance Bed Mobility: Sit to Supine       Sit to supine: Min guard        Transfers Overall transfer level: Needs assistance Equipment used: Rolling walker (2 wheels) Transfers: Sit to/from Stand Sit to Stand: Min guard                Ambulation/Gait Ambulation/Gait assistance: Min guard Gait Distance (Feet): 15 Feet (x2) Assistive device: Rolling walker (2 wheels) Gait Pattern/deviations: Step-through pattern, Decreased stride length       General Gait Details: on exiting bathroom pt reports he can walk in hallway however pt with 4/4 DoE with ambulation from bathroom to bed and reports he thinks he needs to go back to bed       Balance Overall balance assessment: Needs assistance Sitting-balance support: Feet supported Sitting balance-Leahy Scale: Good     Standing balance support: During functional activity, Reliant on assistive device for balance Standing balance-Leahy Scale: Fair                              Cognition Arousal/Alertness: Awake/alert Behavior During Therapy: Flat affect Overall Cognitive Status: Within Functional Limits for tasks assessed                                 General Comments: require enforcement of energy conservation throughout day.           General Comments General comments (skin integrity, edema, etc.): increased education about energy conservation throughout the day. used yesterday as an example, pt pushed himself to walk 250  feet with PT, however then only able to perform transfers with OT and short bout of ambulation in afternoon. Similar circumstances today, did well with OT initially but unable to work with PT afterward, and when able to work with PT only able to go to bathroom and back. Requires 4L O2 via [place] to maintain SpO2 >88%O2      Pertinent Vitals/Pain Pain Assessment Pain Assessment: Faces Faces Pain Scale: Hurts whole lot Pain Location: bottom L midfoot with ambulation and  back with bed mobiity Pain Descriptors / Indicators: Discomfort Pain Intervention(s): Limited activity within patient's tolerance, Monitored during session, Repositioned, RN gave pain meds during session     PT Goals (current goals can now be found in the care plan section) Acute Rehab PT Goals PT Goal Formulation: With patient Time For Goal Achievement: 11/08/22 Potential to Achieve Goals: Fair Progress towards PT goals: Not progressing toward goals - comment    Frequency    Min 1X/week      PT Plan Discharge plan needs to be updated       AM-PAC PT "6 Clicks" Mobility   Outcome Measure  Help needed turning from your back to your side while in a flat bed without using bedrails?: None Help needed moving from lying on your back to sitting on the side of a flat bed without using bedrails?: None Help needed moving to and from a bed to a chair (including a wheelchair)?: None Help needed standing up from a chair using your arms (e.g., wheelchair or bedside chair)?: A Little Help needed to walk in hospital room?: A Little Help needed climbing 3-5 steps with a railing? : A Lot 6 Click Score: 20    End of Session Equipment Utilized During Treatment: Oxygen Activity Tolerance: Patient limited by fatigue Patient left: in bed;with call bell/phone within reach;with family/visitor present Nurse Communication: Mobility status PT Visit Diagnosis: Muscle weakness (generalized) (M62.81);Difficulty in walking, not elsewhere classified (R26.2);Pain;Other abnormalities of gait and mobility (R26.89) Pain - Right/Left: Left Pain - part of body: Ankle and joints of foot (back)     Time: 1610-9604 PT Time Calculation (min) (ACUTE ONLY): 37 min  Charges:  $Therapeutic Activity: 8-22 mins $Self Care/Home Management: 23-37                     Kayd Launer B. Beverely Risen PT, DPT Acute Rehabilitation Services Please use secure chat or  Call Office [phone]    Elon Alas  [facility] 10/26/2022, 12:07 PM

## 2022-10-26 NOTE — Progress Notes (Signed)
Occupational Therapy Treatment Patient Details Name: Jacob Reynolds MRN: [id number] DOB: [birth year] Today's Date: 10/26/2022   History of present illness 57 yo male adm 10/13/22 for SOB and feeling of throat closing. Possible PNA.  Paracentesis 5/3, 5/4, 5/9, 5/13. 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 Pt with poor activity tolerance, fatiguing quickly and limited by L foot pain this session, needing up to 6L O2 to keep SpO2 in 90's with mobility. Encouraged IS use and pt able to demo x5 at end of session. Pt presenting with impairments listed below, will follow acutely. Recommend HHOT if pt able to have 24/7 assist at home, if unable will benefit from postacute rehab at d/c.    Recommendations for follow up therapy are one component of a multi-disciplinary discharge planning process, led by the attending physician.  Recommendations may be updated based on patient status, additional functional criteria and insurance authorization.    Assistance Recommended at Discharge Frequent or constant Supervision/Assistance  Patient can return home with the following  A little help with walking and/or transfers;A lot of help with bathing/dressing/bathroom;Assistance with cooking/housework;Direct supervision/assist for financial management;Direct supervision/assist for medications management;Help with stairs or ramp for entrance;Assist for transportation   Equipment Recommendations  BSC/3in1    Recommendations for Other Services PT consult    Precautions / Restrictions Precautions Precautions: Fall;Other (comment) Precaution Comments: watch sats Restrictions Weight Bearing Restrictions: No       Mobility Bed Mobility Overal bed mobility: Needs Assistance Bed Mobility: Sit to  Supine       Sit to supine: Min guard        Transfers Overall transfer level: Needs assistance Equipment used: Rolling walker (2 wheels) Transfers: Sit to/from Stand Sit to Stand: Min guard                 Balance Overall balance assessment: Needs assistance Sitting-balance support: Feet supported Sitting balance-Leahy Scale: Good     Standing balance support: During functional activity, Reliant on assistive device for balance Standing balance-Leahy Scale: Fair                             ADL either performed or assessed with clinical judgement   ADL Overall ADL's : Needs assistance/impaired                     Lower Body Dressing: Minimal assistance;Sitting/lateral leans Lower Body Dressing Details (indicate cue type and reason): donning socks seated EOB Toilet Transfer: Min guard;Ambulation;Regular Toilet;Rolling walker (2 wheels)   Toileting- Clothing Manipulation and Hygiene: Supervision/safety       Functional mobility during ADLs: Min guard;Rolling walker (2 wheels)      Extremity/Trunk Assessment Upper Extremity Assessment Upper Extremity Assessment: Generalized weakness   Lower Extremity Assessment Lower Extremity Assessment: Defer to PT evaluation        Vision   Vision Assessment?: No apparent visual deficits   Perception Perception Perception: Not tested   Praxis Praxis Praxis: Not tested    Cognition Arousal/Alertness: Awake/alert Behavior During Therapy: Flat affect Overall Cognitive Status: Within Functional Limits for tasks assessed  General Comments: pt with good awareness of decreased activity tolernace, needs min encouragement to participate in session        Exercises Other Exercises Other Exercises: IS x5, pt pulling ~500    Shoulder Instructions       General Comments SpO2 dropping to high 80's on 3L o2 with short distance mobility, increased to  4-6L and SpO2 in 90s    Pertinent Vitals/ Pain       Pain Assessment Pain Assessment: Faces Pain Score: 5  Faces Pain Scale: Hurts even more Pain Location: bottom L midfoot Pain Descriptors / Indicators: Discomfort Pain Intervention(s): Monitored during session, Limited activity within patient's tolerance, Repositioned  Home Living                                          Prior Functioning/Environment              Frequency  Min 1X/week        Progress Toward Goals  OT Goals(current goals can now be found in the care plan section)  Progress towards OT goals: Progressing toward goals  Acute Rehab OT Goals Patient Stated Goal: none stated OT Goal Formulation: With patient Time For Goal Achievement: 11/08/22 Potential to Achieve Goals: Good ADL Goals Pt Will Perform Upper Body Dressing: with modified independence;sitting Pt Will Perform Lower Body Dressing: with modified independence;sitting/lateral leans;sit to/from stand Pt Will Transfer to Toilet: with modified independence;ambulating;regular height toilet Pt Will Perform Tub/Shower Transfer: with modified independence;ambulating;shower seat;Tub transfer;Shower transfer  Plan Discharge plan remains appropriate;Frequency remains appropriate    Co-evaluation                 AM-PAC OT "6 Clicks" Daily Activity     Outcome Measure   Help from another person eating meals?: A Little Help from another person taking care of personal grooming?: A Little Help from another person toileting, which includes using toliet, bedpan, or urinal?: A Little Help from another person bathing (including washing, rinsing, drying)?: A Lot Help from another person to put on and taking off regular upper body clothing?: A Little Help from another person to put on and taking off regular lower body clothing?: A Little 6 Click Score: 17    End of Session Equipment Utilized During Treatment: Gait belt;Rolling walker  (2 wheels);Oxygen (3-6L)  OT Visit Diagnosis: Unsteadiness on feet (R26.81);Other abnormalities of gait and mobility (R26.89);Muscle weakness (generalized) (M62.81)   Activity Tolerance Patient tolerated treatment well   Patient Left in bed;with call bell/phone within reach;with family/visitor present;with bed alarm set   Nurse Communication Mobility status        Time: 1610-9604 OT Time Calculation (min): 22 min  Charges: OT General Charges $OT Visit: 1 Visit OT Treatments $Self Care/Home Management : 8-22 mins  Carver Fila, OTD, OTR/L SecureChat Preferred Acute Rehab [phone]   Carver Fila Koonce 10/26/2022, 9:59 AM

## 2022-10-26 NOTE — Progress Notes (Signed)
[facility]                                                                              Jacob Reynolds, is a 57 y.o. male, DOB - 02/11/1966, ZOX:[id number] Admit date - 10/13/2022    Outpatient Primary MD for the patient is [name], FNP  LOS - 13  days  Chief Complaint  Patient presents with   Shortness of Breath       Brief summary   Patient is a 57 year old male with HTN, stage IV renal cancer with metastasis to bone presented with feeling of " throat closing up".  He questioned if it was an allergic reaction as he was on several medications. He had not darted belzutifan 120 mg daily he had as he was supposed to start today. Patient just recently hospitalized 4/27-4/29 with intractable nausea, vomiting, and abdominal Per patient, since being home he had increased abdominal swelling, dry mouth.  His throat symptoms improved in the ER however still felt some discomfort.  He previously had thrush.  Also reported nausea, back pain, fatigue, shortness of breath, substernal chest discomfort during the episode. En route with EMS, patient was given epinephrine nebulizer treatment with improvement in his symptoms   In ED, noted to be tachycardia, tachypnea, O2 sats on room air. Sodium 122, CO2 19, BUN 22, creatinine 1.23 X-rays of the neck negative.  Chest x-ray showed diffuse interstitial groundglass opacities, mildly increased, representing edema superimposed on interstitial changes.  VQ scan negative for PE Patient was admitted for further workup.   Significant events 5/3: Underwent paracentesis, 3.9 L removed.  Oncology consulted 5/4: Underwent thoracentesis, left, 520 cc removed.  Started on chemotherapy, Belzutifan 5/5: Sodium trending down, nephrology consulted   Assessment & Plan    Principal Problem:   SOB (shortness of breath), ?Throat closing/probable CAP -On admission, patient had presented with acute feeling of throat closing and shortness of  breath.  -Chest x-ray done with diffuse interstitial groundglass opacities.  VQ scan negative for PE -Plain films of the neck done unremarkable. CT soft tissue neck showed no acute findings, left supraclavicular lymph node increased in size, left pleural effusion decreased. -Underwent left thoracentesis on 5/4 with 520 cc of cloudy yellow chylous appearing fluid removed, + malignant cells -Has completed the course of doxycycline and Rocephin.  Acute on chronic hyponatremia -Sodium noted at 130 on 10/10/2022 when patient recently discharged.  On admit, 122.  -Patient started on chemotherapy on 10/15/2022, with Belzutifan. -Per nephrology concern for hypervolemic hyponatremia, also concern for some form of HRS physiology as well as dilutional iso-SIADH. -Continues to trend downward despite salt tabs, IV Lasix. -Sodium 120 today, requested nephrology revaluation.  Tolvaptan vs urena?  -s/p IV albumin    Mildly elevated troponin/Abn 2 d echo -2D echo with EF of 45 to 50%, left ventricle with mildly decreased function, LV was not well-visualized but appears abnormal, left ventricular global hypokinesis cardiac MRI may allow better view and quantification of LV systolic function.   -Seen by cardiology, no plan for ischemic evaluation.  Guideline directed medical therapy somewhat limited by hypotension. -Per cardiology if blood pressure  improves will consider addition of metoprolol and ARB rather than amlodipine which he was previously taking. -Nephrology following, on Lasix.  Continue Crestor    Stage IV renal cell carcinoma with mets to the bone/malignant ascites/recurrent hypoalbuminemia/malignant pleural effusion -Patient noted to be followed by Dr. Katragadda/oncology recently completed XRT. -Recent CT with progression of disease with left pleural effusion, ascites, concern for lymphangitic spread. -Patient was supposed to have started on belzutifan on the day of admission. -S/p paracentesis  10/14/2022 with 3.9 L of fluid removed, paracentesis on 10/20/2022 with 1.2 L of fluid removed, s/p paracentesis 10/24/2022 with 2.4 L removed -Status post IV albumin.  -S/p left-sided thoracentesis on 5/4, 520 cc of fluid removed with cytology consistent with malignant cells. -Patient started on belzutifan per oncology recommendations during this hospitalization. -d/w oncology, Dr. Mosetta Putt, Pleurx in abdomen generally not recommended by IR due to risk of infection. Also, d/w patient's primary oncologist, Dr. Ellin Saba on 5/ 6 and will arrange outpatient paracentesis at [facility]. -Palliative medicine consulted for goals of care.    General debility -Patient notes to be living alone with difficulty getting around. -May need SNF placement due to concern for safety.    QT prolongation -QTc noted at 690 on presentation. -Repeat EKG on 10/19/2022 with resolution of QT prolongation.    Hyperlipidemia -Statin.   Hypertension -Norvasc was discontinued due to soft blood pressure.   Severe protein calorie malnutrition, hypoalbuminemia --Status post IV albumin.    Hyperkalemia -Lokelma x 1    Estimated body mass index is 20.66 kg/m as calculated from the following:   Height as of this encounter: 5\' 11"  (1.803 m).   Weight as of this encounter: 67.2 kg.  Code Status: Full code DVT Prophylaxis:  enoxaparin (LOVENOX) injection 40 mg Start: 10/13/22 0815   Level of Care: Level of care: Progressive Family Communication: Updated patient Disposition Plan:      Remains inpatient appropriate:      Procedures:  Paracentesis x 3 Thoracentesis  Consultants:   Nephrology Oncology  Antimicrobials:   Anti-infectives (From admission, onward)    Start     Dose/Rate Route Frequency Ordered Stop   10/14/22 2200  doxycycline (VIBRA-TABS) tablet 100 mg        100 mg Oral Every 12 hours 10/14/22 1140 10/18/22 0845   10/13/22 2200  doxycycline (VIBRAMYCIN) 100 mg in sodium chloride 0.9 % 250  mL IVPB  Status:  Discontinued        100 mg 125 mL/hr over 120 Minutes Intravenous Every 12 hours 10/13/22 2050 10/14/22 1140   10/13/22 2100  cefTRIAXone (ROCEPHIN) 2 g in sodium chloride 0.9 % 100 mL IVPB  Status:  Discontinued        2 g 200 mL/hr over 30 Minutes Intravenous Every 24 hours 10/13/22 2048 10/22/22 0834          Medications  belzutifan  120 mg Oral Daily   Chlorhexidine Gluconate Cloth  6 each Topical Daily   enoxaparin (LOVENOX) injection  40 mg Subcutaneous Q24H   feeding supplement  237 mL Oral BID BM   fluticasone furoate-vilanterol  1 puff Inhalation Daily   furosemide  40 mg Intravenous BID   lidocaine  2 patch Transdermal Q24H   magnesium oxide  400 mg Oral Daily   midodrine  5 mg Oral TID WC   pantoprazole  40 mg Oral Q0600   rosuvastatin  5 mg Oral Daily   senna-docusate  1 tablet Oral BID   simethicone  160 mg Oral QID   sodium chloride flush  10-40 mL Intracatheter Q12H   sodium chloride flush  3 mL Intravenous Q12H   sodium chloride  2 g Oral BID WC   sodium zirconium cyclosilicate  10 g Oral BID   umeclidinium bromide  1 puff Inhalation Daily      Subjective:   Jacob Reynolds was seen and examined today.  Feeling very dry, thirsty.  Very weak.  No chest pain, shortness of breath or fevers.  Objective:   Vitals:   10/26/22 0307 10/26/22 0704 10/26/22 0739 10/26/22 1140  BP: 98/79 100/70  94/72  Pulse: 94 98  92  Resp: (!) 23   19  Temp: (!) 97.5 F (36.4 C) 97.6 F (36.4 C)  97.6 F (36.4 C)  TempSrc: Oral Axillary  Oral  SpO2:   93% 93%  Weight:      Height:        Intake/Output Summary (Last 24 hours) at 10/26/2022 1253 Last data filed at 10/26/2022 1100 Gross per 24 hour  Intake 692 ml  Output 100 ml  Net 592 ml     Wt Readings from Last 3 Encounters:  10/13/22 67.2 kg  10/11/22 66.3 kg  10/09/22 72.7 kg     Exam General: Alert and oriented x 3, NAD, cachectic, ill-appearing Cardiovascular: S1 S2 auscultated,   RRR Respiratory: Diminished breath sound at the bases Gastrointestinal: Soft, nontender, distended, + bowel sounds Ext: no pedal edema bilaterally Neuro: no new FND's psych: Normal affect     Data Reviewed:  I have personally reviewed following labs    CBC Lab Results  Component Value Date   WBC 12.3 (H) 10/26/2022   RBC 4.30 10/26/2022   HGB 12.1 (L) 10/26/2022   HCT 36.2 (L) 10/26/2022   MCV 84.2 10/26/2022   MCH 28.1 10/26/2022   PLT 214 10/26/2022   MCHC 33.4 10/26/2022   RDW 23.6 (H) 10/26/2022   LYMPHSABS 0.3 (L) 10/20/2022   MONOABS 0.8 10/20/2022   EOSABS 0.0 10/20/2022   BASOSABS 0.0 10/20/2022     Last metabolic panel Lab Results  Component Value Date   NA 120 (L) 10/26/2022   K 5.7 (H) 10/26/2022   CL 83 (L) 10/26/2022   CO2 22 10/26/2022   BUN 46 (H) 10/26/2022   CREATININE 1.20 10/26/2022   GLUCOSE 97 10/26/2022   GFRNONAA >60 10/26/2022   GFRAA 119 09/05/2015   CALCIUM 9.9 10/26/2022   PHOS 3.3 10/13/2022   PROT 5.7 (L) 10/19/2022   ALBUMIN 2.7 (L) 10/19/2022   LABGLOB 3.0 08/05/2021   AGRATIO 1.5 08/05/2021   BILITOT 0.5 10/19/2022   ALKPHOS 232 (H) 10/19/2022   AST 35 10/19/2022   ALT 26 10/19/2022   ANIONGAP 15 10/26/2022    CBG (last 3)  Recent Labs    10/24/22 1708  GLUCAP 98      Coagulation Profile: No results for input(s): "INR", "PROTIME" in the last 168 hours.   Radiology Studies: I have personally reviewed the imaging studies  No results found.     Thad Ranger M.D. [facility] 10/26/2022, 12:53 PM  Available via Epic secure chat 7am-7pm After 7 pm, please refer to night coverage provider listed on amion.

## 2022-10-27 ENCOUNTER — Inpatient Hospital Stay: Payer: 59 | Admitting: Hematology

## 2022-10-27 ENCOUNTER — Inpatient Hospital Stay: Payer: 59

## 2022-10-27 DIAGNOSIS — C7951 Secondary malignant neoplasm of bone: Secondary | ICD-10-CM | POA: Diagnosis not present

## 2022-10-27 DIAGNOSIS — R0602 Shortness of breath: Secondary | ICD-10-CM | POA: Diagnosis not present

## 2022-10-27 DIAGNOSIS — J189 Pneumonia, unspecified organism: Secondary | ICD-10-CM | POA: Diagnosis not present

## 2022-10-27 DIAGNOSIS — E871 Hypo-osmolality and hyponatremia: Secondary | ICD-10-CM | POA: Diagnosis not present

## 2022-10-27 DIAGNOSIS — Z7189 Other specified counseling: Secondary | ICD-10-CM

## 2022-10-27 DIAGNOSIS — R7989 Other specified abnormal findings of blood chemistry: Secondary | ICD-10-CM | POA: Diagnosis not present

## 2022-10-27 DIAGNOSIS — I5020 Unspecified systolic (congestive) heart failure: Secondary | ICD-10-CM | POA: Diagnosis not present

## 2022-10-27 LAB — CBC
HCT: 36.7 % — ABNORMAL LOW (ref 39.0–52.0)
Hemoglobin: 12.3 g/dL — ABNORMAL LOW (ref 13.0–17.0)
MCH: 28 pg (ref 26.0–34.0)
MCHC: 33.5 g/dL (ref 30.0–36.0)
MCV: 83.4 fL (ref 80.0–100.0)
Platelets: 216 10*3/uL (ref 150–400)
RBC: 4.4 MIL/uL (ref 4.22–5.81)
RDW: 23.4 % — ABNORMAL HIGH (ref 11.5–15.5)
WBC: 13.3 10*3/uL — ABNORMAL HIGH (ref 4.0–10.5)
nRBC: 0 % (ref 0.0–0.2)

## 2022-10-27 LAB — BASIC METABOLIC PANEL
Anion gap: 13 (ref 5–15)
BUN: 51 mg/dL — ABNORMAL HIGH (ref 6–20)
CO2: 23 mmol/L (ref 22–32)
Calcium: 10.1 mg/dL (ref 8.9–10.3)
Chloride: 84 mmol/L — ABNORMAL LOW (ref 98–111)
Creatinine, Ser: 1.41 mg/dL — ABNORMAL HIGH (ref 0.61–1.24)
GFR, Estimated: 58 mL/min — ABNORMAL LOW (ref >60)
Glucose, Bld: 89 mg/dL (ref 70–99)
Potassium: 5.8 mmol/L — ABNORMAL HIGH (ref 3.5–5.1)
Sodium: 120 mmol/L — ABNORMAL LOW (ref 135–145)

## 2022-10-27 LAB — CORTISOL: Cortisol, Plasma: 29.3 ug/dL

## 2022-10-27 MED ORDER — UREA 15 G PO PACK
30.0000 g | PACK | Freq: Two times a day (BID) | ORAL | Status: DC
  Administered 2022-10-27: 30 g via ORAL
  Filled 2022-10-27 (×3): qty 2

## 2022-10-27 MED ORDER — HYDROMORPHONE HCL 1 MG/ML IJ SOLN
0.5000 mg | INTRAMUSCULAR | Status: DC | PRN
  Administered 2022-10-28: 0.5 mg via INTRAVENOUS
  Filled 2022-10-27: qty 0.5

## 2022-10-27 MED ORDER — FUROSEMIDE 10 MG/ML IJ SOLN
20.0000 mg | Freq: Two times a day (BID) | INTRAMUSCULAR | Status: DC
  Administered 2022-10-27 – 2022-10-29 (×3): 20 mg via INTRAVENOUS
  Filled 2022-10-27 (×3): qty 2

## 2022-10-27 MED ORDER — SODIUM ZIRCONIUM CYCLOSILICATE 10 G PO PACK
10.0000 g | PACK | Freq: Three times a day (TID) | ORAL | Status: AC
  Administered 2022-10-27 (×3): 10 g via ORAL
  Filled 2022-10-27 (×3): qty 1

## 2022-10-27 NOTE — Progress Notes (Signed)
Physical Therapy Treatment Patient Details Name: Jacob Reynolds MRN: [id number] DOB: 06/04/1966 Today's Date: 10/27/2022   History of Present Illness 57 yo male adm 10/13/22 for SOB and feeling of throat closing. Possible PNA.  Paracentesis 5/3, 5/4, 5/9, 5/13. 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 Reports he is so tired that he can't get up again today. Attempt bed level exercise, however pt with increased L groin and ankle pain in presence of global fatigue and weakness. Pt reports "I just don't want to hurt anymore." Provided pt with heat packs for L groin pain. Pt very appreciative of PT and reports regrets he can not do more. PT assures pt that he did his best and that willingness to try is also appreciated. D/c plans remain appropriate. Therapy will look for results of meeting tomorrow.      Recommendations for follow up therapy are one component of a multi-disciplinary discharge planning process, led by the attending physician.  Recommendations may be updated based on patient status, additional functional criteria and insurance authorization.  Follow Up Recommendations  Can patient physically be transported by private vehicle: Yes    Assistance Recommended at Discharge Frequent or constant Supervision/Assistance  Patient can return home with the following Assistance with cooking/housework;Assist for transportation;Help with stairs or ramp for entrance   Equipment Recommendations  Rollator (4 wheels)    Recommendations for Other Services       Precautions / Restrictions Precautions Precautions: Fall;Other (comment) Precaution Comments: watch sats Restrictions Weight Bearing Restrictions: No           Cognition Arousal/Alertness: Awake/alert, Lethargic Behavior During Therapy: Flat affect Overall Cognitive Status: Within Functional Limits for tasks assessed                                  General Comments: very ill looking with increased effort for participation        Exercises General Exercises - Lower Extremity Ankle Circles/Pumps: AROM, 10 reps, Supine, Right (increased pain with L ankle movement) Heel Slides: AROM, Right, 5 reps, Supine (unable to perform on L due to groin pain)    General Comments General comments (skin integrity, edema, etc.): increased effort for participation in limited bed level exercise,  states that there will be a meeting tomorrow about possible Hospice care. reports he does not know how he feels about it but does not want to be in pain any more      Pertinent Vitals/Pain Pain Assessment Pain Assessment: Faces Faces Pain Scale: Hurts whole lot Pain Location: bottom L midfoot, L groin and low back Pain Descriptors / Indicators: Discomfort Pain Intervention(s): Limited activity within patient's tolerance, Monitored during session, Repositioned     PT Goals (current goals can now be found in the care plan section) Acute Rehab PT Goals PT Goal Formulation: With patient Time For Goal Achievement: 11/08/22 Potential to Achieve Goals: Fair Progress towards PT goals: Not progressing toward goals - comment    Frequency    Min 1X/week      PT Plan Current plan remains appropriate       AM-PAC PT "6 Clicks" Mobility   Outcome Measure  Help needed turning from your back to your side while in a flat bed without using bedrails?: A Little Help needed moving from  lying on your back to sitting on the side of a flat bed without using bedrails?: A Little Help needed moving to and from a bed to a chair (including a wheelchair)?: A Lot Help needed standing up from a chair using your arms (e.g., wheelchair or bedside chair)?: Total Help needed to walk in hospital room?: Total Help needed climbing 3-5 steps with a railing? : Total 6 Click Score: 11    End of Session Equipment Utilized During  Treatment: Oxygen Activity Tolerance: Patient limited by fatigue Patient left: in bed;with call bell/phone within reach;with family/visitor present Nurse Communication: Mobility status PT Visit Diagnosis: Muscle weakness (generalized) (M62.81);Difficulty in walking, not elsewhere classified (R26.2);Pain;Other abnormalities of gait and mobility (R26.89) Pain - Right/Left: Left Pain - part of body: Ankle and joints of foot (back)     Time: 1610-9604 PT Time Calculation (min) (ACUTE ONLY): 16 min  Charges:  $Therapeutic Exercise: 8-22 mins                     Lynnett Langlinais B. Beverely Risen PT, DPT Acute Rehabilitation Services Please use secure chat or  Call Office [phone]    [facility] 10/27/2022, 3:44 PM

## 2022-10-27 NOTE — Progress Notes (Addendum)
[facility]                                                                              Jacob Reynolds, is a 57 y.o. male, DOB - Aug 10, 1965, VWU:[id number] Admit date - 10/13/2022    Outpatient Primary MD for the patient is [name], FNP  LOS - 13  days  Chief Complaint  Patient presents with   Shortness of Breath       Brief summary   Patient is a 57 year old male with HTN, stage IV renal cancer with metastasis to bone presented with feeling of " throat closing up".  He questioned if it was an allergic reaction as he was on several medications. He had not darted belzutifan 120 mg daily he had as he was supposed to start today. Patient just recently hospitalized 4/27-4/29 with intractable nausea, vomiting, and abdominal Per patient, since being home he had increased abdominal swelling, dry mouth.  His throat symptoms improved in the ER however still felt some discomfort.  He previously had thrush.  Also reported nausea, back pain, fatigue, shortness of breath, substernal chest discomfort during the episode. En route with EMS, patient was given epinephrine nebulizer treatment with improvement in his symptoms   In ED, noted to be tachycardia, tachypnea, O2 sats on room air. Sodium 122, CO2 19, BUN 22, creatinine 1.23 X-rays of the neck negative.  Chest x-ray showed diffuse interstitial groundglass opacities, mildly increased, representing edema superimposed on interstitial changes.  VQ scan negative for PE Patient was admitted for further workup.   Significant events 5/3: Underwent paracentesis, 3.9 L removed.  Oncology consulted 5/4: Underwent thoracentesis, left, 520 cc removed.  Started on chemotherapy, Belzutifan 5/5: Sodium trending down, nephrology consulted   Assessment & Plan    Principal Problem:   SOB (shortness of breath), ?Throat closing/probable CAP -On admission, patient had presented with acute feeling of throat closing and shortness of  breath.  -Chest x-ray done with diffuse interstitial groundglass opacities.  VQ scan negative for PE -Plain films of the neck done unremarkable. CT soft tissue neck showed no acute findings, left supraclavicular lymph node increased in size, left pleural effusion decreased. -Underwent left thoracentesis on 5/4 with 520 cc of cloudy yellow chylous appearing fluid removed, + malignant cells -Has completed the course of doxycycline and Rocephin.  Acute on chronic hyponatremia -Sodium noted at 130 on 10/10/2022 when patient recently discharged.  On admit, 122.  -Patient started on chemotherapy on 10/15/2022, with Belzutifan. -Nephrology following, no significant improvement despite Lasix, salt tabs -Sodium still 120, started Ure-Na 30 g twice daily    Mildly elevated troponin/Abn 2 d echo -2D echo with EF of 45 to 50%, global hypokinesis cardiac MRI may allow better view and quantification of LV systolic function.   -Seen by cardiology, no plan for ischemic evaluation.  Guideline directed medical therapy somewhat limited by hypotension. -Per cardiology, if BP improves will consider addition of metoprolol and ARB rather than amlodipine which he was previously taking. -Nephrology following, on Lasix.  Continue Crestor    Stage IV renal cell carcinoma with mets to the bone/malignant ascites/recurrent hypoalbuminemia/malignant pleural effusion -  Patient noted to be followed by Dr. Katragadda/oncology recently completed XRT. -Recent CT with progression of disease with left pleural effusion, ascites, concern for lymphangitic spread. -S/p paracentesis 10/14/2022 with 3.9 L of fluid removed, paracentesis on 10/20/2022 with 1.2 L of fluid removed, s/p paracentesis 10/24/2022 with 2.4 L removed -Status post IV albumin.  -S/p left-sided thoracentesis on 5/4, 520 cc of fluid removed with cytology consistent with malignant cells. -Patient was started on belzutifan per oncology recs during this hospitalization. -d/w  oncology, Dr. Mosetta Putt, Pleurx in abdomen generally not recommended by IR due to risk of infection. Also, d/w patient's primary oncologist, Dr. Ellin Saba on 5/ 6 and will arrange outpatient paracentesis at [facility] weekly. -Palliative medicine consult pending for goals of care. -Have requested Dr Mosetta Putt to reevaluate, very poor functional status, multiple paracentesis, failure to thrive, may be a candidate for hospice at this time, will defer to oncology    General debility -Patient notes to be living alone with difficulty getting around. -May need SNF placement due to concern for safety.    QT prolongation -QTc noted at 690 on presentation. -Repeat EKG on 10/19/2022 with resolution of QT prolongation.    Hyperlipidemia -Statin.   Hypertension -Norvasc was discontinued due to soft blood pressure.   Severe protein calorie malnutrition, hypoalbuminemia --Status post IV albumin.    Hyperkalemia -Potassium 5.8, placed on Lokelma    Estimated body mass index is 20.66 kg/m as calculated from the following:   Height as of this encounter: 5\' 11"  (1.803 m).   Weight as of this encounter: 67.2 kg.  Code Status: Full code DVT Prophylaxis:  enoxaparin (LOVENOX) injection 40 mg Start: 10/13/22 0815   Level of Care: Level of care: Progressive Family Communication: Updated patient Disposition Plan:      Remains inpatient appropriate:      Procedures:  Paracentesis x 3 Thoracentesis  Consultants:   Nephrology Oncology  Antimicrobials:   Anti-infectives (From admission, onward)    Start     Dose/Rate Route Frequency Ordered Stop   10/14/22 2200  doxycycline (VIBRA-TABS) tablet 100 mg        100 mg Oral Every 12 hours 10/14/22 1140 10/18/22 0845   10/13/22 2200  doxycycline (VIBRAMYCIN) 100 mg in sodium chloride 0.9 % 250 mL IVPB  Status:  Discontinued        100 mg 125 mL/hr over 120 Minutes Intravenous Every 12 hours 10/13/22 2050 10/14/22 1140   10/13/22 2100  cefTRIAXone  (ROCEPHIN) 2 g in sodium chloride 0.9 % 100 mL IVPB  Status:  Discontinued        2 g 200 mL/hr over 30 Minutes Intravenous Every 24 hours 10/13/22 2048 10/22/22 0834          Medications  belzutifan  120 mg Oral Daily   Chlorhexidine Gluconate Cloth  6 each Topical Daily   enoxaparin (LOVENOX) injection  40 mg Subcutaneous Q24H   feeding supplement  237 mL Oral BID BM   fluticasone furoate-vilanterol  1 puff Inhalation Daily   furosemide  40 mg Intravenous BID   lidocaine  2 patch Transdermal Q24H   magnesium oxide  400 mg Oral Daily   midodrine  5 mg Oral TID WC   pantoprazole  40 mg Oral Q0600   rosuvastatin  5 mg Oral Daily   senna-docusate  1 tablet Oral BID   simethicone  160 mg Oral QID   sodium chloride flush  10-40 mL Intracatheter Q12H   sodium chloride flush  3 mL Intravenous Q12H   sodium chloride  2 g Oral BID WC   sodium zirconium cyclosilicate  10 g Oral BID   umeclidinium bromide  1 puff Inhalation Daily      Subjective:   Jacob Reynolds was seen and examined today.  Unfortunate situation, no significant improvement in sodium, feels very weak and deconditioned.  No acute chest pain, shortness of breath, abdominal pain.    Objective:   Vitals:   10/26/22 0307 10/26/22 0704 10/26/22 0739 10/26/22 1140  BP: 98/79 100/70  94/72  Pulse: 94 98  92  Resp: (!) 23   19  Temp: (!) 97.5 F (36.4 C) 97.6 F (36.4 C)  97.6 F (36.4 C)  TempSrc: Oral Axillary  Oral  SpO2:   93% 93%  Weight:      Height:        Intake/Output Summary (Last 24 hours) at 10/26/2022 1253 Last data filed at 10/26/2022 1100 Gross per 24 hour  Intake 692 ml  Output 100 ml  Net 592 ml     Wt Readings from Last 3 Encounters:  10/13/22 67.2 kg  10/11/22 66.3 kg  10/09/22 72.7 kg    Physical Exam General: Alert and oriented x 3, NAD, ill-appearing, cachectic Cardiovascular: S1 S2 clear, RRR.  Respiratory: CTAB, no wheezing Gastrointestinal: Soft, nontender, distended,  NBS Ext: no pedal edema bilaterally Neuro: no new deficits Psych: Normal affect     Data Reviewed:  I have personally reviewed following labs    CBC Lab Results  Component Value Date   WBC 12.3 (H) 10/26/2022   RBC 4.30 10/26/2022   HGB 12.1 (L) 10/26/2022   HCT 36.2 (L) 10/26/2022   MCV 84.2 10/26/2022   MCH 28.1 10/26/2022   PLT 214 10/26/2022   MCHC 33.4 10/26/2022   RDW 23.6 (H) 10/26/2022   LYMPHSABS 0.3 (L) 10/20/2022   MONOABS 0.8 10/20/2022   EOSABS 0.0 10/20/2022   BASOSABS 0.0 10/20/2022     Last metabolic panel Lab Results  Component Value Date   NA 120 (L) 10/26/2022   K 5.7 (H) 10/26/2022   CL 83 (L) 10/26/2022   CO2 22 10/26/2022   BUN 46 (H) 10/26/2022   CREATININE 1.20 10/26/2022   GLUCOSE 97 10/26/2022   GFRNONAA >60 10/26/2022   GFRAA 119 09/05/2015   CALCIUM 9.9 10/26/2022   PHOS 3.3 10/13/2022   PROT 5.7 (L) 10/19/2022   ALBUMIN 2.7 (L) 10/19/2022   LABGLOB 3.0 08/05/2021   AGRATIO 1.5 08/05/2021   BILITOT 0.5 10/19/2022   ALKPHOS 232 (H) 10/19/2022   AST 35 10/19/2022   ALT 26 10/19/2022   ANIONGAP 15 10/26/2022    CBG (last 3)  Recent Labs    10/24/22 1708  GLUCAP 98      Coagulation Profile: No results for input(s): "INR", "PROTIME" in the last 168 hours.   Radiology Studies: I have personally reviewed the imaging studies  No results found.     Thad Ranger M.D. [facility] 10/26/2022, 12:53 PM  Available via Epic secure chat 7am-7pm After 7 pm, please refer to night coverage provider listed on amion.

## 2022-10-27 NOTE — Progress Notes (Signed)
Ok to give [employer] x3 doses for k 5.8 per Dr. Isidoro Donning.  Ulyses Southward, PharmD, BCIDP, AAHIVP, CPP Infectious Disease Pharmacist 10/27/2022 8:27 AM

## 2022-10-27 NOTE — Progress Notes (Signed)
Jacob Reynolds   DOB:10/09/1965   ZO#:[id number]   JWJ#:[id number]  Hem/Onc   Subjective: Pt has been tolerating Belzutifan well, no side effects reported except dry mouth. However he remains to be extremely fatigued, with very poor appetite, not eating much, really does not want to take multiple oral medicines.  He had repeated paracentesis twice in the past week.    Objective:  Vitals:   10/27/22 1105 10/27/22 1522  BP: 100/70 93/74  Pulse: 93 93  Resp: 20 19  Temp: 97.6 F (36.4 C) 97.6 F (36.4 C)  SpO2: 90%     Body mass index is 20.54 kg/m.  Intake/Output Summary (Last 24 hours) at 10/27/2022 2018 Last data filed at 10/27/2022 1840 Gross per 24 hour  Intake 1200 ml  Output 200 ml  Net 1000 ml     Sclerae unicteric, cachectic appearing  Oropharynx clear  No peripheral adenopathy  Lungs clear, decreased breath sounds on left side  Heart regular rate and rhythm  Abdomen soft,  significantly distended  MSK no focal spinal tenderness, no peripheral edema  Neuro nonfocal    CBG (last 3)  No results for input(s): "GLUCAP" in the last 72 hours.   Labs:  Urine Studies No results for input(s): "UHGB", "CRYS" in the last 72 hours.  Invalid input(s): "UACOL", "UAPR", "USPG", "UPH", "UTP", "UGL", "UKET", "UBIL", "UNIT", "UROB", "ULEU", "UEPI", "UWBC", "URBC", "UBAC", "CAST", "UCOM", "BILUA"  Basic Metabolic Panel: Recent Labs  Lab 10/21/22 0010 10/22/22 0326 10/23/22 0320 10/24/22 0415 10/25/22 0400 10/25/22 0750 10/26/22 0330 10/27/22 0510  NA 124* 126* 126* 124* 123*  --  120* 120*  K 4.6 4.5 5.0 5.1 5.3*   < > 5.7* 5.8*  CL 88* 88* 87* 91* 87*  --  83* 84*  CO2 25 26 24 22 23   --  22 23  GLUCOSE 104* 106* 121* 100* 93  --  97 89  BUN 41* 37* 35* 37* 39*  --  46* 51*  CREATININE 0.76 0.74 0.86 0.93 1.02  --  1.20 1.41*  CALCIUM 8.8* 9.3 9.7 9.1 9.8  --  9.9 10.1  MG 2.3 2.1  --  2.1  --   --  2.3  --    < > = values in this interval not displayed.    GFR Estimated Creatinine Clearance: 55.3 mL/min (A) (by C-G formula based on SCr of 1.41 mg/dL (H)). Liver Function Tests: No results for input(s): "AST", "ALT", "ALKPHOS", "BILITOT", "PROT", "ALBUMIN" in the last 168 hours.  No results for input(s): "LIPASE", "AMYLASE" in the last 168 hours.  No results for input(s): "AMMONIA" in the last 168 hours. Coagulation profile No results for input(s): "INR", "PROTIME" in the last 168 hours.   CBC: Recent Labs  Lab 10/22/22 0326 10/24/22 0415 10/25/22 0400 10/26/22 0330 10/27/22 0510  WBC 9.5 10.1 10.9* 12.3* 13.3*  HGB 11.8* 12.3* 11.6* 12.1* 12.3*  HCT 35.7* 37.2* 35.2* 36.2* 36.7*  MCV 83.6 84.0 84.0 84.2 83.4  PLT 240 270 219 214 216   Cardiac Enzymes: No results for input(s): "CKTOTAL", "CKMB", "CKMBINDEX", "TROPONINI" in the last 168 hours. BNP: Invalid input(s): "POCBNP" CBG: Recent Labs  Lab 10/24/22 1708  GLUCAP 98   D-Dimer No results for input(s): "DDIMER" in the last 72 hours. Hgb A1c No results for input(s): "HGBA1C" in the last 72 hours. Lipid Profile No results for input(s): "CHOL", "HDL", "LDLCALC", "TRIG", "CHOLHDL", "LDLDIRECT" in the last 72 hours. Thyroid function studies No results for input(s): "  TSH", "T4TOTAL", "T3FREE", "THYROIDAB" in the last 72 hours.  Invalid input(s): "FREET3" Anemia work up No results for input(s): "VITAMINB12", "FOLATE", "FERRITIN", "TIBC", "IRON", "RETICCTPCT" in the last 72 hours. Microbiology No results found for this or any previous visit (from the past 240 hour(s)).     Studies:  No results found.  Assessment: 57 y.o. male  Dysphagia and dyspnea, probably secondary to left pleural effusion, questionable pneumonia Persistent hyponatremia Metastatic renal cell carcinoma to bones Recently developed large volume ascites and left pleural effusion, malignant, s/p repeated paracentesis and thoracentesis Chronic hyponatremia Hypertension Deconditioning Severe  protein and calorie malnutrition Failure to thrive    Plan:  -He has been on Belzutifan  120mg  daily for about 10 days, no significant clinical benefit has been seen. -He is clinically doing poorly, with persistent large volume ascites, requiring frequent paracentesis.  He has persistent hyponatremia, not responding to medical treatment. -He is not eating much solid food, able to tolerate some liquids.   -I agree that he is hospice appropriate, his life expectancy is likely to be a few weeks to a month.  I discussed the logistics of hospice with patient, and he agrees with hospice care.  His mother was on hospice care before, so he is familiar with hospice. -He has a male friend visiting him when I saw him today.  She stepped out of the room and talk to me after my visit. She states that his apartment is too dirty to live there, and his family will not able to support him at home.  She has had trouble to get a hold of patient's niece who is his POA.  He is unlikely able to go home. -Please contact hospice of [facility], or [facility] for residential hospice -Okay to stop  Belzutifan  and some other non-essential oral medications.   Malachy Mood, MD 10/27/2022

## 2022-10-27 NOTE — Progress Notes (Signed)
[facility] Progress Note   Assessment/ Plan:   Hypervolemic hyponatremia: In the setting of volume overload with low urine sodium. Asymptomatic currently. Likely some form of HRS physiology as well as possible heart failure contributing.   - continue midodrine 5 mg TID -Continue salt tabs 2 g twice daily for now- I think this is contributing to his increased thirst so would like to get them off those at some point if we can - decrease Lasix back to 20 mg IV BID - check cortisol -this was normal - start Urena 30 g BID - palliative care c/s   Hyperkalemia: - lokelma    Recurrent ascites: Status post paracentesis on 5/3 and 5/9.  5/13   Metastatic cancer: Renal cell.  Management per oncology, started chemo   HFrEF: cardiology following.  Evidence-based medicine limited by hypotension.    Subjective:   \ Na still 120, K 5.8.  BP still soft, Cr bumped a little with increased Lasix.    He looks miserable and says that he has too many pills and they're making him sick.    We discussed having a palliative care c/s- will order.    Objective:   BP 100/70 (BP Location: Right Arm)   Pulse 93   Temp 97.6 F (36.4 C) (Oral)   Resp 20   Ht 5\' 11"  (1.803 m)   Wt 66.8 kg   SpO2 90%   BMI 20.54 kg/m   Intake/Output Summary (Last 24 hours) at 10/27/2022 1130 Last data filed at 10/27/2022 0717 Gross per 24 hour  Intake 840 ml  Output 200 ml  Net 640 ml   Weight change:   Physical Exam: LKG:MWNUU, NAD CVS: RRR Resp: clear Abd: + distended Ext: no LE edema  Imaging: No results found.  Labs: BMET Recent Labs  Lab 10/21/22 0010 10/22/22 0326 10/23/22 0320 10/24/22 0415 10/25/22 0400 10/25/22 0750 10/26/22 0330 10/27/22 0510  NA 124* 126* 126* 124* 123*  --  120* 120*  K 4.6 4.5 5.0 5.1 5.3* 5.6* 5.7* 5.8*  CL 88* 88* 87* 91* 87*  --  83* 84*  CO2 25 26 24 22 23   --  22 23  GLUCOSE 104* 106* 121* 100* 93  --  97 89  BUN 41* 37* 35* 37* 39*  --  46*  51*  CREATININE 0.76 0.74 0.86 0.93 1.02  --  1.20 1.41*  CALCIUM 8.8* 9.3 9.7 9.1 9.8  --  9.9 10.1   CBC Recent Labs  Lab 10/24/22 0415 10/25/22 0400 10/26/22 0330 10/27/22 0510  WBC 10.1 10.9* 12.3* 13.3*  HGB 12.3* 11.6* 12.1* 12.3*  HCT 37.2* 35.2* 36.2* 36.7*  MCV 84.0 84.0 84.2 83.4  PLT 270 219 214 216    Medications:     belzutifan  120 mg Oral Daily   Chlorhexidine Gluconate Cloth  6 each Topical Daily   enoxaparin (LOVENOX) injection  40 mg Subcutaneous Q24H   feeding supplement  237 mL Oral BID BM   fluticasone furoate-vilanterol  1 puff Inhalation Daily   furosemide  20 mg Intravenous BID   lidocaine  2 patch Transdermal Q24H   magnesium oxide  400 mg Oral Daily   midodrine  5 mg Oral TID WC   pantoprazole  40 mg Oral Q0600   rosuvastatin  5 mg Oral Daily   senna-docusate  1 tablet Oral BID   simethicone  160 mg Oral QID   sodium chloride flush  10-40 mL Intracatheter Q12H  sodium chloride flush  3 mL Intravenous Q12H   sodium chloride  2 g Oral BID WC   sodium zirconium cyclosilicate  10 g Oral TID   umeclidinium bromide  1 puff Inhalation Daily   urea  30 g Oral BID    Bufford Buttner MD 10/27/2022, 11:30 AM

## 2022-10-27 NOTE — Progress Notes (Signed)
Mobility Specialist Progress Note:   10/27/22 1001  Mobility  Activity Ambulated with assistance in hallway  Level of Assistance Contact guard assist, steadying assist  Assistive Device Front wheel walker  Distance Ambulated (ft) 34 ft  Activity Response Tolerated fair  Mobility Referral Yes  $Mobility charge 1 Mobility  Mobility Specialist Start Time (ACUTE ONLY) [id number]  Mobility Specialist Stop Time (ACUTE ONLY) 1000  Mobility Specialist Time Calculation (min) (ACUTE ONLY) 22 min   Pt received in bed, agreeable to ambulate. Pt c/o 8/10 pain in his left foot and abdominal pain. Pt ambulated on 4L/min Edith Endave. Took x1 standing break d/t SOB and fatigue. Pt assisted to the BR, voided and minimal BM. Assisted to chair with call light and phone at hand.   Pre Mobility SPO2 90% Hr 94 During Mobility SPO2 94% Hr 102 Post Mobility SPO2 93% Hr 103  Thompson Grayer Mobility Specialist  Please Engineer, structural or  Rehab Office [phone]

## 2022-10-27 NOTE — TOC Progression Note (Signed)
Transition of Care [facility]) - Progression Note    Patient Details  Name: Jacob Reynolds MRN: [id number] Date of Birth: 1966/03/02  Transition of Care [facility]) CM/SW Contact  Carley Hammed, LCSW Phone Number: 10/27/2022, 2:25 PM  Clinical Narrative:    CSW met with pt at bedside to follow up on SNF decision and provide bed offers. Pt states he has a friend coming in who will check his account and see if he can afford his 50% of a SNF stay. Pt states he and his friend will review bed offers. TOC will continue to follow for disposition needs.    Expected Discharge Plan: Home w Home Health Services Barriers to Discharge: Continued Medical Work up  Expected Discharge Plan and Services   Discharge Planning Services: CM Consult   Living arrangements for the past 2 months: Single Family Home                 DME Arranged:  (Await PT recommendations)         HH Arranged: PT HH Agency: [facility] Date HH Agency Contacted: 10/17/22 Time HH Agency Contacted: 1257 Representative spoke with at [facility] Agency: Amy   Social Determinants of Health (SDOH) Interventions SDOH Screenings   Depression (PHQ2-9): Medium Risk (08/08/2022)  Tobacco Use: High Risk (10/24/2022)    Readmission Risk Interventions     No data to display

## 2022-10-28 ENCOUNTER — Inpatient Hospital Stay (HOSPITAL_COMMUNITY): Payer: 59

## 2022-10-28 DIAGNOSIS — R0609 Other forms of dyspnea: Secondary | ICD-10-CM | POA: Diagnosis not present

## 2022-10-28 DIAGNOSIS — R0602 Shortness of breath: Secondary | ICD-10-CM | POA: Diagnosis not present

## 2022-10-28 DIAGNOSIS — R7989 Other specified abnormal findings of blood chemistry: Secondary | ICD-10-CM | POA: Diagnosis not present

## 2022-10-28 DIAGNOSIS — J189 Pneumonia, unspecified organism: Secondary | ICD-10-CM | POA: Diagnosis not present

## 2022-10-28 DIAGNOSIS — C7951 Secondary malignant neoplasm of bone: Secondary | ICD-10-CM | POA: Diagnosis not present

## 2022-10-28 DIAGNOSIS — R180 Malignant ascites: Secondary | ICD-10-CM | POA: Diagnosis not present

## 2022-10-28 DIAGNOSIS — Z7189 Other specified counseling: Secondary | ICD-10-CM | POA: Diagnosis not present

## 2022-10-28 DIAGNOSIS — E871 Hypo-osmolality and hyponatremia: Secondary | ICD-10-CM | POA: Diagnosis not present

## 2022-10-28 HISTORY — PX: IR THORACENTESIS ASP PLEURAL SPACE W/IMG GUIDE: IMG5380

## 2022-10-28 LAB — BASIC METABOLIC PANEL
Anion gap: 15 (ref 5–15)
BUN: 68 mg/dL — ABNORMAL HIGH (ref 6–20)
CO2: 21 mmol/L — ABNORMAL LOW (ref 22–32)
Calcium: 9.9 mg/dL (ref 8.9–10.3)
Chloride: 84 mmol/L — ABNORMAL LOW (ref 98–111)
Creatinine, Ser: 1.82 mg/dL — ABNORMAL HIGH (ref 0.61–1.24)
GFR, Estimated: 43 mL/min — ABNORMAL LOW (ref >60)
Glucose, Bld: 87 mg/dL (ref 70–99)
Potassium: 6 mmol/L — ABNORMAL HIGH (ref 3.5–5.1)
Sodium: 120 mmol/L — ABNORMAL LOW (ref 135–145)

## 2022-10-28 LAB — CBC
HCT: 35.6 % — ABNORMAL LOW (ref 39.0–52.0)
Hemoglobin: 11.9 g/dL — ABNORMAL LOW (ref 13.0–17.0)
MCH: 27.9 pg (ref 26.0–34.0)
MCHC: 33.4 g/dL (ref 30.0–36.0)
MCV: 83.4 fL (ref 80.0–100.0)
Platelets: 234 10*3/uL (ref 150–400)
RBC: 4.27 MIL/uL (ref 4.22–5.81)
RDW: 23.6 % — ABNORMAL HIGH (ref 11.5–15.5)
WBC: 12.7 10*3/uL — ABNORMAL HIGH (ref 4.0–10.5)
nRBC: 0 % (ref 0.0–0.2)

## 2022-10-28 LAB — PROCALCITONIN: Procalcitonin: 5.56 ng/mL

## 2022-10-28 LAB — BRAIN NATRIURETIC PEPTIDE: B Natriuretic Peptide: 46.3 pg/mL (ref 0.0–100.0)

## 2022-10-28 MED ORDER — SODIUM ZIRCONIUM CYCLOSILICATE 10 G PO PACK
10.0000 g | PACK | Freq: Three times a day (TID) | ORAL | Status: DC
  Administered 2022-10-28: 10 g via ORAL
  Filled 2022-10-28: qty 1

## 2022-10-28 MED ORDER — LORAZEPAM 1 MG PO TABS
1.0000 mg | ORAL_TABLET | ORAL | Status: DC | PRN
  Administered 2022-10-29: 1 mg via ORAL
  Filled 2022-10-28: qty 1

## 2022-10-28 MED ORDER — HYDROMORPHONE HCL 1 MG/ML IJ SOLN
1.0000 mg | INTRAMUSCULAR | Status: DC | PRN
  Administered 2022-10-28 – 2022-10-29 (×2): 1 mg via INTRAVENOUS
  Filled 2022-10-28 (×3): qty 1

## 2022-10-28 MED ORDER — POLYVINYL ALCOHOL 1.4 % OP SOLN: 1.0000 [drp] | Freq: Four times a day (QID) | OPHTHALMIC | Status: DC | PRN

## 2022-10-28 MED ORDER — LORAZEPAM 2 MG/ML PO CONC: 1.0000 mg | ORAL | Status: DC | PRN

## 2022-10-28 MED ORDER — BIOTENE DRY MOUTH MT LIQD: 15.0000 mL | OROMUCOSAL | Status: DC | PRN

## 2022-10-28 MED ORDER — HYDROMORPHONE HCL 1 MG/ML IJ SOLN
1.0000 mg | INTRAMUSCULAR | Status: DC
  Administered 2022-10-28 – 2022-10-29 (×4): 1 mg via INTRAVENOUS
  Filled 2022-10-28 (×3): qty 1

## 2022-10-28 MED ORDER — OXYCODONE HCL 5 MG PO TABS: 5.0000 mg | ORAL_TABLET | ORAL | Status: DC | PRN

## 2022-10-28 MED ORDER — LIDOCAINE HCL 1 % IJ SOLN
INTRAMUSCULAR | Status: AC
  Filled 2022-10-28: qty 20

## 2022-10-28 NOTE — Progress Notes (Signed)
[facility] Progress Note   Assessment/ Plan:   Hypervolemic hyponatremia: In the setting of volume overload with low urine sodium. Asymptomatic currently. Likely some form of HRS physiology as well as possible heart failure contributing.   - related to cancer likely - seems to be transitioning to palliative/ comfort - OK to stop any meds that are not aiding in comfort for this pt - will sign off- call qith eustions  Hyperkalemia: - lokelma    Recurrent ascites: Status post paracentesis on 5/3 and 5/9.  5/13   Metastatic cancer: Renal cell.  Management per oncology, started chemo   HFrEF: cardiology following.  Evidence-based medicine limited by hypotension.    Subjective:    Family meeting today- likely transitioning to comfort care.      Objective:   BP (!) 86/73 (BP Location: Right Arm)   Pulse 99   Temp 97.8 F (36.6 C) (Oral)   Resp 19   Ht 5\' 11"  (1.803 m)   Wt 67.5 kg   SpO2 91%   BMI 20.75 kg/m   Intake/Output Summary (Last 24 hours) at 10/28/2022 1106 Last data filed at 10/28/2022 1610 Gross per 24 hour  Intake 480 ml  Output --  Net 480 ml   Weight change: 0.7 kg  Physical Exam: RUE:AVWUJ, NAD CVS: RRR Resp: clear Abd: + distended Ext: no LE edema  Imaging: DG Chest 1 View  Result Date: 10/28/2022 CLINICAL DATA:  Provided history: Status post thoracentesis. Status post left thoracentesis (1.2 L removed). EXAM: CHEST  1 VIEW COMPARISON:  Prior chest radiographs 10/28/2022 and earlier. Chest CT 10/08/2022. FINDINGS: Right chest infusion port catheter with tip at the level of the superior cavoatrial junction. Heart size within normal limits. Aortic atherosclerosis. No evidence of pneumothorax status post thoracentesis. Small left pleural effusion, decreased in size from the chest radiograph performed earlier today. Persistent trace right pleural effusion. Prominence of the interstitial lung markings. Persistent patchy airspace disease within  the right lung. Known pulmonary nodules and osseous metastases were better appreciated on the prior chest CT of 10/08/2022. IMPRESSION: 1. No evidence of pneumothorax status post reported left thoracentesis. Small residual left pleural effusion. 2. Persistent trace right pleural effusion. 3. Persistent patchy airspace disease within the right lung. 4. Background prominence of the interstitial lung markings suggesting interstitial edema. 5.  Aortic Atherosclerosis (ICD10-I70.0). Electronically Signed   By: Jackey Loge D.O.   On: 10/28/2022 09:29   DG Chest Port 1 View  Result Date: 10/28/2022 CLINICAL DATA:  Shortness of breath EXAM: PORTABLE CHEST 1 VIEW COMPARISON:  10/15/2022 FINDINGS: Right Port-A-Cath remains in place, unchanged. Small to moderate left pleural effusion, increasing since prior study. Extensive bilateral airspace opacities, most pronounced in the left lower lobe and right perihilar region, significantly worsened since prior study. Heart mediastinal contours within normal limits. No acute bony abnormality. IMPRESSION: Worsening left effusion and bilateral airspace disease. Electronically Signed   By: Charlett Nose M.D.   On: 10/28/2022 03:44    Labs: BMET Recent Labs  Lab 10/22/22 0326 10/23/22 0320 10/24/22 0415 10/25/22 0400 10/25/22 0750 10/26/22 0330 10/27/22 0510 10/28/22 0314  NA 126* 126* 124* 123*  --  120* 120* 120*  K 4.5 5.0 5.1 5.3* 5.6* 5.7* 5.8* 6.0*  CL 88* 87* 91* 87*  --  83* 84* 84*  CO2 26 24 22 23   --  22 23 21*  GLUCOSE 106* 121* 100* 93  --  97 89 87  BUN 37* 35* 37*  39*  --  46* 51* 68*  CREATININE 0.74 0.86 0.93 1.02  --  1.20 1.41* 1.82*  CALCIUM 9.3 9.7 9.1 9.8  --  9.9 10.1 9.9   CBC Recent Labs  Lab 10/25/22 0400 10/26/22 0330 10/27/22 0510 10/28/22 0314  WBC 10.9* 12.3* 13.3* 12.7*  HGB 11.6* 12.1* 12.3* 11.9*  HCT 35.2* 36.2* 36.7* 35.6*  MCV 84.0 84.2 83.4 83.4  PLT 219 214 216 234    Medications:     belzutifan  120 mg Oral  Daily   Chlorhexidine Gluconate Cloth  6 each Topical Daily   enoxaparin (LOVENOX) injection  40 mg Subcutaneous Q24H   feeding supplement  237 mL Oral BID BM   fluticasone furoate-vilanterol  1 puff Inhalation Daily   furosemide  20 mg Intravenous BID   lidocaine  2 patch Transdermal Q24H   magnesium oxide  400 mg Oral Daily   midodrine  5 mg Oral TID WC   pantoprazole  40 mg Oral Q0600   rosuvastatin  5 mg Oral Daily   senna-docusate  1 tablet Oral BID   simethicone  160 mg Oral QID   sodium chloride flush  10-40 mL Intracatheter Q12H   sodium chloride flush  3 mL Intravenous Q12H   sodium chloride  2 g Oral BID WC   sodium zirconium cyclosilicate  10 g Oral TID   umeclidinium bromide  1 puff Inhalation Daily   urea  30 g Oral BID    Bufford Buttner MD 10/28/2022, 11:06 AM

## 2022-10-28 NOTE — Progress Notes (Signed)
Daily Progress Note   Patient Name: Jacob Reynolds       Date: 10/28/2022 DOB: [birth year]  Age: 57 y.o. MRN#: [id number] Attending Physician: Cathren Harsh, MD Primary Care Physician: [name], FNP Admit Date: 10/13/2022  Reason for Consultation/Follow-up: Establishing goals of care  Subjective: Medical records reviewed including progress notes, labs, and imaging. Patient assessed at the bedside. He reports continued severe back pain. Discussed with NCM and RN. Patient's nephew and friend are present for today's scheduled family meeting.  Created space and opportunity for patient's thoughts and feelings. He reiterates some of his concerns from yesterday in the presence of his loved ones. With patient's permission, I also reviewed recent conversations with oncology and primary attending with patient's support system. His nephew is very tearful and overwhelmed to hear that patient likely has weeks to live. He was raised by the patient and cares for him dearly. Aram Beecham advocates for patient's desire to speak with his own oncologist to help him come to terms with his illness and prognosis.   Given patient's worsening respiratory status and overall debility, I shared my recommendation for consideration of comfort focused care and residential hospice facility placement for additional support after discharge. Explained that patient would no longer receive aggressive medical interventions such as continuous vital signs, lab work, radiology testing, or medications not focused on comfort. All care would focus on how the patient is looking and feeling. This would include management of any symptoms that may cause discomfort, pain, shortness of breath, cough, nausea, agitation, anxiety, and/or secretions  etc. Symptoms would be managed with medications and other non-pharmacological interventions such as spiritual support if requested, repositioning, music therapy, or therapeutic listening. Patient verbalized understanding and appreciation, while also sharing he isn't quite ready to "give up" yet. He would like to minimize medications but continue chemotherapy pills at least through the weekend. His hope is that this might give him another few days to a week of life to spend with his loved ones. I confirmed that this could be a modification to comfort care.   Patient is ready to proceed to comfort care with the knowledge that he can continue his chemotherapy in the hospital. He is aware that this will not be continued at a hospice facility and wishes to delay referral until Monday in light  of this. He is also interested in another paracentesis for relief. Reviewed adjustments to patient's pain regimen and provided reassurance that we would continue to titrate as needed while weaning his oxygen as tolerated throughout the weekend. Patient is very thankful and satisfied with the conclusion of the meeting  Questions and concerns addressed. PMT will continue to support holistically.   Length of Stay: 15  Physical Exam Vitals and nursing note reviewed.  Constitutional:      Appearance: He is ill-appearing.  Cardiovascular:     Rate and Rhythm: Normal rate.  Pulmonary:     Effort: Pulmonary effort is normal. No respiratory distress.  Abdominal:     General: There is distension.  Neurological:     Mental Status: He is alert and oriented to person, place, and time.  Psychiatric:        Mood and Affect: Mood normal.        Behavior: Behavior normal.            Vital Signs: BP 97/69 (BP Location: Right Arm)   Pulse 90   Temp 97.7 F (36.5 C) (Oral)   Resp 19   Ht 5\' 11"  (1.803 m)   Wt 67.5 kg   SpO2 96%   BMI 20.75 kg/m  SpO2: SpO2: 96 % O2 Device: O2 Device: High Flow Nasal Cannula O2 Flow  Rate: O2 Flow Rate (L/min): 8 L/min      Palliative Assessment/Data: 40%    Palliative Care Assessment & Plan   Patient Profile: 57 y.o. 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    Patient was recently hospitalized 4/27-4/29 with intractable nausea/vomiting and abdominal pain, ascites and pleural effusion requiring paracentesis along with thoracentesis. He is now re-admitted for possible CAP and acute on chronic hyponatremia,  PMT has been consulted to assist with goals of care conversation.  Assessment: Goals of care conversation Stage IV renal cancer with mets to bone Hyponatremia refractory to treatment Recurrent ascites HFrEF Acute hypoxic respiratory failure  Recommendations/Plan: Continue DNR/DNI Transition to comfort care with the exception of continuing chemotherapy pills through the weekend as currently ordered Dilaudid PRN for pain/air hunger/comfort Ativan PRN for agitation/anxiety Liquifilm tears PRN for dry eyes May have comfort feeding Comfort cart for family Unrestricted visitations in the setting of EOL (per policy) Oxygen PRN 2L or less for comfort. No escalation.   Continue to discuss patient's wishes surrounding transfer to hospice facility on Monday Psychosocial and emotional support provided PMT will continue to follow and support   Prognosis:  < 2 weeks  Discharge Planning: Hospice facility  Care plan was discussed with Patient, patient's nephew/HCPOA, patient's friend, TOC, Dr. Isidoro Donning, Dr. Mosetta Putt, Dr. Ellin Saba, RN   Total time: I spent 80 minutes in the care of the patient today in the above activities and documenting the encounter.  MDM high         Blakleigh Straw Jeni Salles, PA-C  Palliative Medicine Team Team phone # [phone]  Thank you for allowing the Palliative Medicine Team to assist in the care of  this patient. Please utilize secure chat with additional questions, if there is no response within 30 minutes please call the above phone number.  Palliative Medicine Team providers are available by phone from 7am to 7pm daily and can be reached through the team cell phone.  Should this patient require assistance outside of  these hours, please call the patient's attending physician.

## 2022-10-28 NOTE — Progress Notes (Signed)
[facility]                                                                              Jacob Reynolds, is a 57 y.o. 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  He questioned if it was an allergic reaction as he was on several medications. He had not darted belzutifan 120 mg daily he had as he was supposed to start today. Patient just recently hospitalized 4/27-4/29 with intractable nausea, vomiting, and abdominal Per patient, since being home he had increased abdominal swelling, dry mouth.  His throat symptoms improved in the ER however still felt some discomfort.  He previously had thrush.  Also reported nausea, back pain, fatigue, shortness of breath, substernal chest discomfort during the episode. En route with EMS, patient was given epinephrine nebulizer treatment with improvement in his symptoms   In ED, noted to be tachycardia, tachypnea, O2 sats on room air. Sodium 122, CO2 19, BUN 22, creatinine 1.23 X-rays of the neck negative.  Chest x-ray showed diffuse interstitial groundglass opacities, mildly increased, representing edema superimposed on interstitial changes.  VQ scan negative for PE Patient was admitted for further workup.   Significant events 5/3: Underwent paracentesis, 3.9 L removed.  Oncology consulted 5/4: Underwent thoracentesis, left, 520 cc removed.  Started on chemotherapy, Belzutifan 5/5: Sodium trending down, nephrology consulted  5/16: Reevaluated by oncology, recommended hospice 5/17: Overnight issues noted, acute hypoxia, on 8 L O2 HFNC, worsening left pleural effusion, thoracentesis ordered.  GOC done by myself this  morning, DNR/DNI.  Patient wants to wait for palliative meeting later today for further decisions, leaning towards comfort care.   Assessment & Plan    Principal Problem:   SOB (shortness of breath), ?Throat closing/probable CAP Acute respiratory failure with hypoxia -On admission, patient had presented with acute feeling of throat closing and shortness of breath. Chest x-ray done with diffuse interstitial groundglass opacities.  VQ scan negative for PE. Plain films of the neck done unremarkable. CT soft tissue neck showed no acute findings, left supraclavicular lymph node increased in size, left pleural effusion decreased. -Underwent left thoracentesis on 5/4 with 520 cc of cloudy yellow chylous appearing fluid removed, + malignant cells -Patient had completed the course of doxycycline and IV Rocephin -Overnight issues noted, patient with worsening shortness of breath, initially placed on 4 L O2, further desaturating, subsequently placed on 8 L O2 HFNC.  Chest x-ray showed worsening left pleural effusion, borderline BP, afebrile. -Underwent left-sided thoracentesis today with 1.2 L removed -see GOC below, DNR/DNI  Acute on chronic refractory hyponatremia, hyperkalemia -Sodium noted at 130 on 10/10/2022 when patient recently discharged.  On admit, 122.  -Nephrology following, no significant improvement despite Lasix, salt tabs, ure-Na -Sodium still 120 - HyperK 6.0,  on Lokelma 3 times daily    Stage IV renal cell carcinoma with mets to the bone/malignant ascites Recurrent hypoalbuminemia malignant pleural effusion, malignant ascites -Patient noted to be followed by Dr. Katragadda/oncology recently completed XRT. -Recent CT with progression of disease with left pleural effusion, ascites, concern for lymphangitic spread. -S/p paracentesis 10/14/2022 with 3.9 L of fluid removed, paracentesis on 10/20/2022 with 1.2 L of fluid removed, s/p paracentesis 10/24/2022 with 2.4 L removed -Status post IV  albumin.  -S/p left-sided thoracentesis on 5/4, 520 cc of fluid removed with cytology consistent with malignant cells. -Patient was started on belzutifan per oncology recs during this hospitalization. -Overnight issues noted, underwent repeat thoracentesis today due to reaccumulation of left-sided pleural effusion -Appreciate oncology reevaluation by Dr. Mosetta Putt, given poor functional status, no significant improvement with the chemotherapy, recommending hospice      Mildly elevated troponin/Abn 2 d echo -2D echo with EF of 45 to 50%, global hypokinesis cardiac MRI may allow better view and quantification of LV systolic function.   -Seen by cardiology, no plan for ischemic evaluation. Guideline directed medical therapy somewhat limited by hypotension. -Per cardiology, if BP improves will consider addition of metoprolol and ARB rather than amlodipine which he was previously taking. -Nephrology following, on Lasix.  Continue Crestor   General debility -Patient notes to be living alone with difficulty getting around. -May need SNF placement due to concern for safety.    QT prolongation -QTc noted at 690 on presentation. -Repeat EKG on 10/19/2022 with resolution of QT prolongation.    Hyperlipidemia -Statin.   Hypertension -Norvasc was discontinued due to soft blood pressure.   Severe protein calorie malnutrition, hypoalbuminemia, generalized debility, lack of family support Goals of care discussed with the patient today a.m.  - I discussed the current management with the patient. No significant improvement with chemo (Belzutifan) that was started inpatient, poor functional status, FTT, refractory hyponatremia, hyperkalemia, worsening renal function, frequent paracentesis and now new hypoxia on 8L O2HFNC and worsening left sided pleural effusion likely malignant. Thoracentesis ordered for this morning. Recommendations of oncologist (Dr Mosetta Putt) from yesterday reviewed with patient.    He agreed  for DNR/DNI, leaning towards comfort care/hospice but will make his decision after his nephew arrives today for family meeting with palliative medicine.      Estimated body mass index is 20.75 kg/m as calculated from the following:   Height as of this encounter: 5\' 11"  (1.803 m).   Weight as of this encounter: 67.5 kg.  Code Status: Full code DVT Prophylaxis:  enoxaparin (LOVENOX) injection 40 mg Start: 10/13/22 0815   Level of Care: Level of care: Progressive Family Communication: Updated patient Disposition Plan:      Remains inpatient appropriate:      Procedures:  Paracentesis x 3 Thoracentesis x2  Consultants:   Nephrology Oncology Palliative medicine  Antimicrobials:   Anti-infectives (From admission, onward)    Start     Dose/Rate Route Frequency Ordered Stop   10/14/22 2200  doxycycline (VIBRA-TABS) tablet 100 mg        100 mg Oral Every 12 hours 10/14/22 1140 10/18/22 0845   10/13/22 2200  doxycycline (VIBRAMYCIN) 100 mg in sodium chloride 0.9 % 250 mL IVPB  Status:  Discontinued        100 mg 125 mL/hr over 120 Minutes Intravenous Every 12 hours 10/13/22 2050 10/14/22 1140   10/13/22 2100  cefTRIAXone (ROCEPHIN) 2 g in sodium chloride 0.9 % 100 mL IVPB  Status:  Discontinued  2 g 200 mL/hr over 30 Minutes Intravenous Every 24 hours 10/13/22 2048 10/22/22 0834          Medications  belzutifan  120 mg Oral Daily   Chlorhexidine Gluconate Cloth  6 each Topical Daily   enoxaparin (LOVENOX) injection  40 mg Subcutaneous Q24H   feeding supplement  237 mL Oral BID BM   fluticasone furoate-vilanterol  1 puff Inhalation Daily   furosemide  20 mg Intravenous BID   lidocaine  2 patch Transdermal Q24H   magnesium oxide  400 mg Oral Daily   midodrine  5 mg Oral TID WC   pantoprazole  40 mg Oral Q0600   rosuvastatin  5 mg Oral Daily   senna-docusate  1 tablet Oral BID   simethicone  160 mg Oral QID   sodium chloride flush  10-40 mL Intracatheter Q12H    sodium chloride flush  3 mL Intravenous Q12H   sodium chloride  2 g Oral BID WC   sodium zirconium cyclosilicate  10 g Oral TID   umeclidinium bromide  1 puff Inhalation Daily   urea  30 g Oral BID      Subjective:   Harlan Mathes was seen and examined today.  Overnight issues noted, currently on 8 L O2 HFNC, still feeling short of breath, very weak.  Awaiting thoracentesis at the time of my encounter.  Understands that he is not getting any better despite all aggressive measures, multiple paracentesis, thoracentesis, chemotherapy.    Objective:   Vitals:   10/28/22 0343 10/28/22 0351 10/28/22 0504 10/28/22 0733  BP:    (!) 86/73  Pulse:    99  Resp:      Temp:    97.8 F (36.6 C)  TempSrc:    Oral  SpO2:  92% 90% 91%  Weight: 67.5 kg     Height:        Intake/Output Summary (Last 24 hours) at 10/28/2022 0819 Last data filed at 10/28/2022 0102 Gross per 24 hour  Intake 480 ml  Output --  Net 480 ml     Wt Readings from Last 3 Encounters:  10/28/22 67.5 kg  10/11/22 66.3 kg  10/09/22 72.7 kg   Physical Exam General: Alert and oriented x 3, NAD, frail, ill-appearing cachectic, short of breath Cardiovascular: S1 S2 clear, RRR.  Respiratory: Diminished breath sounds at the bases L>R Gastrointestinal: Soft, nontender, ++distended, NBS Ext: no pedal edema bilaterally Neuro: no new deficits Psych: Normal affect, ill-appearing   Data Reviewed:  I have personally reviewed following labs    CBC Lab Results  Component Value Date   WBC 12.7 (H) 10/28/2022   RBC 4.27 10/28/2022   HGB 11.9 (L) 10/28/2022   HCT 35.6 (L) 10/28/2022   MCV 83.4 10/28/2022   MCH 27.9 10/28/2022   PLT 234 10/28/2022   MCHC 33.4 10/28/2022   RDW 23.6 (H) 10/28/2022   LYMPHSABS 0.3 (L) 10/20/2022   MONOABS 0.8 10/20/2022   EOSABS 0.0 10/20/2022   BASOSABS 0.0 10/20/2022     Last metabolic panel Lab Results  Component Value Date   NA 120 (L) 10/28/2022   K 6.0 (H) 10/28/2022   CL  84 (L) 10/28/2022   CO2 21 (L) 10/28/2022   BUN 68 (H) 10/28/2022   CREATININE 1.82 (H) 10/28/2022   GLUCOSE 87 10/28/2022   GFRNONAA 43 (L) 10/28/2022   GFRAA 119 09/05/2015   CALCIUM 9.9 10/28/2022   PHOS 3.3 10/13/2022   PROT 5.7 (L) 10/19/2022  ALBUMIN 2.7 (L) 10/19/2022   LABGLOB 3.0 08/05/2021   AGRATIO 1.5 08/05/2021   BILITOT 0.5 10/19/2022   ALKPHOS 232 (H) 10/19/2022   AST 35 10/19/2022   ALT 26 10/19/2022   ANIONGAP 15 10/28/2022    CBG (last 3)  No results for input(s): "GLUCAP" in the last 72 hours.     Coagulation Profile: No results for input(s): "INR", "PROTIME" in the last 168 hours.   Radiology Studies: I have personally reviewed the imaging studies  DG Chest Port 1 View  Result Date: 10/28/2022 CLINICAL DATA:  Shortness of breath EXAM: PORTABLE CHEST 1 VIEW COMPARISON:  10/15/2022 FINDINGS: Right Port-A-Cath remains in place, unchanged. Small to moderate left pleural effusion, increasing since prior study. Extensive bilateral airspace opacities, most pronounced in the left lower lobe and right perihilar region, significantly worsened since prior study. Heart mediastinal contours within normal limits. No acute bony abnormality. IMPRESSION: Worsening left effusion and bilateral airspace disease. Electronically Signed   By: Charlett Nose M.D.   On: 10/28/2022 03:44       Endre Coutts M.D. [facility] 10/28/2022, 8:19 AM  Available via Epic secure chat 7am-7pm After 7 pm, please refer to night coverage provider listed on amion.

## 2022-10-28 NOTE — Progress Notes (Addendum)
Pt complains of severe SOB. Noted that he desats to 80's on 4 Liters and tachypnic. Low BP. Lungs, diminished. Tried non re-breather but pt did not tolerated.  Dr Arlean Hopping was notified.  -CXR obtained -EKG done. -awaiting labs result. -on 8 Liters high flow [place] -for Thoracentesis this AM. Plan of care ongoing.

## 2022-10-28 NOTE — Procedures (Signed)
Ultrasound-guided therapeutic left sided thoracentesis performed yielding 1.2 liters of pale yellow colored fluid. No immediate complications.  Follow-up chest x-ray pending. EBL is < 2 ml.

## 2022-10-28 NOTE — Progress Notes (Signed)
  Interdisciplinary Goals of Care Family Meeting   Date carried out: 10/28/2022  Location of the meeting: Bedside  Member's involved: Physician (myself) and patient, Mr RORRY TAWES  Durable Power of Attorney or Environmental health practitioner: patient    Discussion: We discussed goals of care for  Malen Gauze   We discussed diagnoses, prognosis, GOC, EOL wishes disposition and options.   Discussed regarding advanced directives in detail.  Concepts specific to code status, artifical feeding and hydration, IV antibiotics and rehospitalization were discussed.  The difference between an aggressive medical intervention path and a comfort care path was discussed.   Discussed limitations of medical interventions to prolong quality of life in some situations.   I discussed the current management with the patient. No significant improvement with chemo (Belzutifan) that was started inpatient, poor functional status, FTT, refractory hyponatremia, hyperkalemia, worsening renal function, frequent paracentesis and now new hypoxia on 8L O2HFNC and worsening left sided pleural effusion likely malignant. Thoracentesis ordered for this morning. Recommendations of oncologist (Dr Mosetta Putt) from yesterday reviewed with patient.   He agreed for DNR/DNI, leaning towards comfort care/hospice but will make his decision after his nephew arrives today for family meeting with palliative medicine.     Code status: Full DNR  Disposition: TBD  Time spent for the meeting: 25 mins   Maida Widger 10/28/2022, 8:11 AM

## 2022-10-28 NOTE — Progress Notes (Signed)
Occupational Therapy Discharge Patient Details Name: Jacob Reynolds MRN: [id number] DOB: 07-09-1965 Today's Date: 10/28/2022 Time:  -     Patient discharged from OT services secondary to  full comfort  .  Please see latest therapy progress note for current level of functioning and progress toward goals.    Progress and discharge plan discussed with patient and/or caregiver: Patient/Caregiver agrees with plan  GO     Mateo Flow 10/28/2022, 4:50 PM

## 2022-10-28 NOTE — Progress Notes (Signed)
TRH night cross cover note:   I was notified by RN of the patient's worsening shortness of breath associated with interval increase in supplemental oxygen requirements.  Specifically, he has been maintaining oxygen saturations in the mid 90s on 4 L nasal cannula, will for experiencing desaturations into the low 80s on his 4 L nasal cannula.  Is now maintaining oxygen saturations in the low to mid 90s on 8 L high flow salter.  Systolic blood pressures in the low 90s, similar to blood pressures throughout dayshift.  Other current vital signs notable for afebrile,  heart rate in the 80s.  No report of any associated chest pain.  I subsequently ordered EKG, chest x-ray.  Chest x-ray shows reaccumulation of left-sided pleural effusion relative to most recent plain film of the chest from 10/15/2022.  He was noted to have undergone left-sided thoracentesis just prior to the chest x-ray from 10/15/2022.  Today's chest x-ray introduces interval worsening of bilateral patchy airspace opacities.  Per my brief chart review, it appears that he was previously diagnosed with community-acquired pneumonia and has completed a course of doxycycline and Rocephin.  It is possible that the interval worsening of patchy bilateral airspace opacities could represent an radiographic delay in the appearance of previously suspected pneumonia versus failure of aforementioned antibiotic regimen versus acute on chronic systolic heart failure.  I have ordered procalcitonin level and BNP to further evaluate these possibilities.  I have also ordered IR guided therapeutic thoracentesis to occur in the morning for the patient's moderate left pleural effusion.    Newton Pigg, DO Hospitalist

## 2022-10-28 NOTE — Progress Notes (Signed)
Mobility Specialist Progress Note:   10/28/22 1029  Mobility  Activity Refused mobility  Mobility Specialist Start Time (ACUTE ONLY) 1016  Mobility Specialist Stop Time (ACUTE ONLY) 1028  Mobility Specialist Time Calculation (min) (ACUTE ONLY) 12 min   Pt refused mobility d/t pt stating "feeling low" and expressed transitioning to palliative care. Will f/u as able.    Thompson Grayer Mobility Specialist  Please contact vis Secure Chat or  Rehab Office [phone]

## 2022-10-28 NOTE — Consult Note (Signed)
Consultation Note Date: 10/27/2022   Patient Name: Jacob Reynolds  DOB: February 05, 1966  MRN: [id number]  Age / Sex: 57 y.o., male  PCP: [name], FNP Referring Physician: Cathren Harsh, MD  Reason for Consultation: Establishing goals of care  HPI/Patient Profile: 57 y.o. 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   Patient was recently hospitalized 4/27-4/29 with intractable nausea/vomiting and abdominal pain, ascites and pleural effusion requiring paracentesis along with thoracentesis. He is now re-admitted for possible CAP and acute on chronic hyponatremia,  PMT has been consulted to assist with goals of care conversation.  Clinical Assessment and Goals of Care:  I have reviewed medical records including EPIC notes, labs and imaging, received report from RN, assessed the patient and then had a phone conversation with patient's friend Aram Beecham and nephew Onalee Hua to discuss diagnosis prognosis, GOC, EOL wishes, disposition and options.  I introduced Palliative Medicine as specialized medical care for people living with serious illness. It focuses on providing relief from the symptoms and stress of a serious illness. The goal is to improve quality of life for both the patient and the family.  We discussed a brief life review of the patient and then focused on their current illness.   I attempted to elicit values and goals of care important to the patient.    Medical History Review and Understanding:  Discussed patient's acute illness in the context of his chronic comorbidities with emphasis on his cancer. Patient states his understanding that "I get medicine for my potassium that's too low and then its too high, medicine for my blood pressure that is too high and then its too low." He would much rather prefer on his  cancer but has not been able with his prolonged hospitalization and other problems.   Social History: Patient is supported by his friend Aram Beecham who he met at the store and has been helping him for the past few months. He lives at home alone. Per Aram Beecham, his home is not suitable for habitation as she has found dog feces in the subflooring that she could not clean out, cockroaches, as well as no water and mold all over his bathroom. She also states that patient's brother plans to return to the home from jail based on a note she read on the door from his parole officer. Brother is reported to be violent.   Functional and Nutritional State: Patient states he can't eat because "all the medicines fill up my stomach." He endorses worsening weakness and functional status. Albumin of 2.7 noted.  Palliative Symptoms: Dry mouth, nausea, pain (abdomen 6/10, left leg 7/10)  Advance Directives: A detailed discussion regarding advanced directives was had. Patient states his niece is primary HCPOA but she may be upset with him as he has not been able to get in touch with her for two weeks. She did not return my call today. His secondary HCPOA is his nephew Onalee Hua. No documentation currently on file.   Code Status: Concepts specific to code status, artifical feeding and hydration, and rehospitalization were considered and discussed. Recommended consideration of DNR status, understanding evidenced-based poor outcomes in similar hospitalized patients, as the cause of the arrest is likely associated with chronic/terminal disease rather than a reversible acute cardio-pulmonary event. Patient understands this and is leaning towards DNR, but wishes to reflect and discuss further with his nephew  first.  Discussion: Patient's goal is to have "just one day without pain." He wonders if he could take a break from his medications as well, just for a day, and then continue with current care as planned. We discussed how tenuous  his condition is and that it is most beneficial to him to make a decision on what is most important at this time, quantity of life or quality of life. He has not had much time to think about any other short term or long term goals. He is disappointed that his current pain regimen is ineffective and appreciative of PMT support with trying additional strategies. He confirms to me that he is not sure about SNF or his ability to afford this, then asks me to speak with his friend Aram Beecham. Provided her with updates and discussed patient's poor prognosis as well as his hospice eligibility. Patient reports he is familiar with hospice through his mother's enrollment but he is not ready himself quite yet. During a private conversation with Aram Beecham, she confides that he is terrified of ending up in a nursing home and agrees that residential hospice might be the best option for him. She is available for a meeting tomorrow afternoon at 3pm to support him as we review options more thoroughly. During my phone call with his nephew Onalee Hua, he shares his understanding that patient has been doing very poorly and would benefit from goals of care conversation with family present. He will be able to attend tomorrow's meeting as well.    The difference between aggressive medical intervention and comfort care was considered in light of the patient's goals of care. Hospice and Palliative Care services outpatient were explained and offered.   Discussed the importance of continued conversation with family and the medical providers regarding overall plan of care and treatment options, ensuring decisions are within the context of the patient's values and GOCs.   Questions and concerns were addressed.  Hard Choices booklet left for review. The family was encouraged to call with questions or concerns.  PMT will continue to support holistically.   SUMMARY OF RECOMMENDATIONS   -Continue full code/full scope treatment for now -Patient is  leaning towards DNR but wishes to discuss further with family first. 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)  Elevated troponin  Prolonged QT interval  Hypocalcemia  Essential hypertension  Protein-calorie malnutrition, severe (HCC)  Physical Exam General: ill-appearing caucasian male HEENT: [place] in place, 4L O2 Cardio: normal heart rate Pulm: no respiratory distress Skin: cool and dry Musculoskeletal: no edema in BL feet, no wounds or erythema either   Vital Signs: BP 100/70 (BP Location: Right Arm)   Pulse 93   Temp 97.6 F (36.4 C) (Oral)   Resp 20   Ht 5\' 11"  (1.803 m)   Wt 66.8 kg   SpO2 90%   BMI 20.54 kg/m  Pain Scale: 0-10    SpO2: SpO2: 90 % O2 Device:SpO2: 90 % O2 Flow Rate: .O2 Flow Rate (L/min): 4 L/min  Palliative Assessment/Data: 40%    MDM: High   Jaydan Meidinger Jeni Salles, PA-C  Palliative Medicine Team Team phone # [phone]  Thank you for allowing the Palliative Medicine Team to assist in the care of this patient. Please utilize secure chat with additional questions, if there is no response within 30 minutes please call the above phone number.  Palliative Medicine Team providers are available by phone from 7am to 7pm daily and can be reached through the team cell phone.  Should this patient require assistance outside of these hours, please call the patient's attending physician.

## 2022-10-28 NOTE — TOC Progression Note (Signed)
Transition of Care [facility]) - Progression Note    Patient Details  Name: Jacob Reynolds MRN: [id number] Date of Birth: 1966/05/15  Transition of Care [facility]) CM/SW Contact  Harriet Masson, RN Phone Number: 10/28/2022, 3:49 PM  Clinical Narrative:     Meryle Ready meeting with palliative, Lajuana Carry. Patient is agreeable to comfort care through the weekend and Monday consider transfer to residential hospice.   Expected Discharge Plan: Home w Home Health Services Barriers to Discharge: Continued Medical Work up  Expected Discharge Plan and Services   Discharge Planning Services: CM Consult   Living arrangements for the past 2 months: Single Family Home                 DME Arranged:  (Await PT recommendations)         HH Arranged: PT HH Agency: [facility] Date HH Agency Contacted: 10/17/22 Time HH Agency Contacted: [id number] Representative spoke with at [facility] Agency: Amy   Social Determinants of Health (SDOH) Interventions SDOH Screenings   Depression (PHQ2-9): Medium Risk (08/08/2022)  Tobacco Use: High Risk (10/28/2022)    Readmission Risk Interventions     No data to display

## 2022-10-29 DIAGNOSIS — C7951 Secondary malignant neoplasm of bone: Secondary | ICD-10-CM | POA: Diagnosis not present

## 2022-10-29 DIAGNOSIS — Z7189 Other specified counseling: Secondary | ICD-10-CM | POA: Diagnosis not present

## 2022-10-29 DIAGNOSIS — R180 Malignant ascites: Secondary | ICD-10-CM | POA: Diagnosis not present

## 2022-10-29 DIAGNOSIS — R0602 Shortness of breath: Secondary | ICD-10-CM | POA: Diagnosis not present

## 2022-10-29 MED ORDER — LORAZEPAM 2 MG/ML IJ SOLN: 1.0000 mg | INTRAMUSCULAR | Status: DC | PRN

## 2022-10-29 MED ORDER — HYDROMORPHONE HCL-NACL 50-0.9 MG/50ML-% IV SOLN
1.0000 mg/h | INTRAVENOUS | Status: DC
  Filled 2022-10-29: qty 50

## 2022-10-29 MED ORDER — HYDROMORPHONE BOLUS VIA INFUSION: 1.0000 mg | INTRAVENOUS | Status: DC | PRN

## 2022-11-10 ENCOUNTER — Ambulatory Visit: Payer: Commercial Managed Care - PPO | Admitting: Family Medicine

## 2022-11-11 ENCOUNTER — Ambulatory Visit (HOSPITAL_BASED_OUTPATIENT_CLINIC_OR_DEPARTMENT_OTHER): Payer: 59 | Admitting: Family

## 2022-11-12 NOTE — Death Summary Note (Signed)
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  He questioned if it was an allergic reaction as he was on several medications. He had not darted belzutifan 120 mg daily he had as he was supposed to start on that information.. Patient just recently hospitalized 4/27-4/29 with intractable nausea, vomiting, and abdominal pain. Per patient, since being home he had increased abdominal swelling, dry mouth.  His throat symptoms improved in the ER however still felt some discomfort.  He previously had thrush.  Also reported nausea, back pain, fatigue, shortness of breath, substernal chest discomfort during the episode. En route with EMS, patient was given epinephrine nebulizer treatment with improvement in his symptoms  In ED, noted to be tachycardia, tachypnea, O2 sats on room air. Sodium 122, CO2 19, BUN 22, creatinine 1.23 X-rays of the neck negative.  Chest x-ray showed diffuse interstitial groundglass opacities, mildly increased, representing edema superimposed on interstitial changes.  VQ scan negative for  PE Patient was admitted for further workup.  Assessment and Plan:  SOB (shortness of breath), ?Throat closing/probable CAP Acute respiratory failure with hypoxia -On admission, patient had presented with acute feeling of throat closing and shortness of breath. Chest x-ray showed diffuse interstitial groundglass opacities.  VQ scan negative for PE. Plain films of the neck done unremarkable. CT soft tissue neck showed no acute findings, left supraclavicular lymph node increased in size, left pleural effusion decreased. -Underwent left thoracentesis on 5/4 with 520 cc of cloudy yellow chylous appearing fluid removed, + malignant cells -Patient had completed the course of doxycycline and IV Rocephin during hospitalization. -On 517 overnight, patient had worsening shortness of breath, with hypoxia, was placed on O2 8 L HFNC.  Chest x-ray showed worsening left-sided pleural effusion.  Underwent left-sided thoracentesis with 1.2 L removed. -Subsequently goals of care completed, DNR/DNI and patient was placed on comfort care per his and family's wishes   Acute on chronic refractory hyponatremia, hyperkalemia -Sodium noted at 130 on 10/10/2022 when patient recently discharged.  On admit, 122.  -Nephrology was consulted, no significant improvement despite Lasix, salt tabs, ure-Na -Sodium continue to be 120 with no significant improvement - HyperK 6.0, on Lokelma 3 times daily     Stage IV renal cell carcinoma with mets to the bone/malignant ascites Recurrent hypoalbuminemia malignant pleural effusion, malignant ascites -Patient noted to be followed by Dr. Katragadda/oncology, had recently completed XRT. -Recent CT with progression of disease with left pleural effusion, ascites, concern for lymphangitic spread. -S/p paracentesis 10/14/2022 with 3.9 L of fluid removed, paracentesis on 10/20/2022 with 1.2 L of fluid removed, s/p paracentesis 10/24/2022 with 2.4 L removed -Status post IV albumin.  -  S/p  left-sided thoracentesis on 5/4, 520 cc of fluid removed with cytology consistent with malignant cells and thoracentesis again on 5/17. -Patient was started on belzutifan per oncology recs during this hospitalization. -Patient was followed by oncology during hospitalization, seen by Dr. Mosetta Putt given poor functional status, no significant improvement with the chemotherapy, recommending hospice         Mildly elevated troponin/Abn 2 d echo -2D echo with EF of 45 to 50%, global hypokinesis cardiac MRI may allow better view and quantification of LV systolic function.   -Seen by cardiology, no plan for ischemic evaluation. Guideline directed medical therapy somewhat limited by hypotension.    General debility -Patient noted to be living alone with difficulty getting around.     QT prolongation -QTc noted at 690 on presentation. -Repeat EKG on 10/19/2022 with resolution of QT prolongation.    Hyperlipidemia -Statin.   Hypertension -Norvasc was discontinued due to soft blood pressure.    Severe protein calorie malnutrition, hypoalbuminemia, generalized debility, lack of family support -Goals of care were discussed by myself and the palliative medicine team patient and family on 5/17. - No significant improvement with chemo (Belzutifan) that was started inpatient, poor functional status, FTT, refractory hyponatremia, hyperkalemia, worsening renal function, frequent paracentesis and hypoxia on 8L O2HFNC and worsening left sided pleural effusion likely malignant.  Underwent thoracentesis x 2, recommended to pursue hospice by oncology.   Patient placed on DNR/DNI status comfort care per his family's wishes.     Estimated body mass index is 20.75 kg/m as calculated from the following:   Height as of this encounter: 5\' 11"  (1.803 m).   Weight as of this encounter: 67.5 kg.  Patient passed on 11/01/2022 at 10:12 AM      Procedures:  Paracentesis x 3 Thoracentesis x2   Consultations:   Nephrology Oncology Palliative medicine  The results of significant diagnostics from this hospitalization (including imaging, microbiology, ancillary and laboratory) are listed below for reference.   Significant Diagnostic Studies: US RENAL  Result Date: 10/28/2022 CLINICAL DATA:  [id number] AKI (acute kidney injury) (HCC) [id number] EXAM: RENAL / URINARY TRACT ULTRASOUND COMPLETE COMPARISON:  CT abdomen pelvis 10/08/2022 FINDINGS: Right Kidney: Renal measurements: 11.8 x 5.6 x 5.1 cm = volume: 176.1 mL. Mild hydronephrosis. Increased renal cortical echogenicity. Left Kidney: Renal measurements: 11.9 x 6.1 x 7.3 cm = volume: There is 276.4 mL. Moderate hydronephrosis. Solid 7.0 cm mass in the upper pole compatible with known renal neoplasm. Increased cortical echogenicity. Bladder: Not visualized. Other: Ascites and bilateral pleural effusions noted. IMPRESSION: Mild right and moderate left hydronephrosis. Solid 7.0 cm left renal mass compatible with known renal neoplasm. Increased renal cortical echogenicity bilaterally, as can be seen in medical renal disease. Ascites and bilateral pleural effusions noted. Electronically Signed   By: Caprice Renshaw M.D.   On: 10/28/2022 12:54   IR THORACENTESIS ASP PLEURAL SPACE W/IMG GUIDE  Result Date: 10/28/2022 INDICATION: 57 year old male. History of renal cell carcinoma with recurrent ascites and left-sided pleural effusion. Request is for therapeutic left-sided thoracentesis EXAM: ULTRASOUND GUIDED THERAPEUTIC LEFT-SIDED THORACENTESIS MEDICATIONS: Lidocaine 1% 10 mL COMPLICATIONS: None immediate. PROCEDURE: An ultrasound guided thoracentesis was thoroughly discussed with the patient and questions answered. The benefits, risks, alternatives and complications were also discussed. The patient understands and wishes to proceed with the procedure. Written consent was obtained. Ultrasound was performed to localize and mark an adequate pocket of fluid in the left chest. The  area was then prepped and draped in the normal  sterile fashion. 1% Lidocaine was used for local anesthesia. Under ultrasound guidance a 6 Fr Safe-T-Centesis catheter was introduced. Thoracentesis was performed. The catheter was removed and a dressing applied. FINDINGS: A total of approximately 1.2 L of pale yellow fluid was removed. IMPRESSION: Successful ultrasound guided therapeutic left-sided thoracentesis yielding 1.2 of pleural fluid. Performed by: Anders Grant, NP Electronically Signed   By: Marliss Coots M.D.   On: 10/28/2022 11:51   DG Chest 1 View  Result Date: 10/28/2022 CLINICAL DATA:  Provided history: Status post thoracentesis. Status post left thoracentesis (1.2 L removed). EXAM: CHEST  1 VIEW COMPARISON:  Prior chest radiographs 10/28/2022 and earlier. Chest CT 10/08/2022. FINDINGS: Right chest infusion port catheter with tip at the level of the superior cavoatrial junction. Heart size within normal limits. Aortic atherosclerosis. No evidence of pneumothorax status post thoracentesis. Small left pleural effusion, decreased in size from the chest radiograph performed earlier today. Persistent trace right pleural effusion. Prominence of the interstitial lung markings. Persistent patchy airspace disease within the right lung. Known pulmonary nodules and osseous metastases were better appreciated on the prior chest CT of 10/08/2022. IMPRESSION: 1. No evidence of pneumothorax status post reported left thoracentesis. Small residual left pleural effusion. 2. Persistent trace right pleural effusion. 3. Persistent patchy airspace disease within the right lung. 4. Background prominence of the interstitial lung markings suggesting interstitial edema. 5.  Aortic Atherosclerosis (ICD10-I70.0). Electronically Signed   By: Jackey Loge D.O.   On: 10/28/2022 09:29   DG Chest Port 1 View  Result Date: 10/28/2022 CLINICAL DATA:  Shortness of breath EXAM: PORTABLE CHEST 1 VIEW COMPARISON:  10/15/2022  FINDINGS: Right Port-A-Cath remains in place, unchanged. Small to moderate left pleural effusion, increasing since prior study. Extensive bilateral airspace opacities, most pronounced in the left lower lobe and right perihilar region, significantly worsened since prior study. Heart mediastinal contours within normal limits. No acute bony abnormality. IMPRESSION: Worsening left effusion and bilateral airspace disease. Electronically Signed   By: Charlett Nose M.D.   On: 10/28/2022 03:44   IR Paracentesis  Result Date: 10/24/2022 INDICATION: Patient with metastatic renal cell carcinoma with recurrent ascites. Request made for therapeutic paracentesis. EXAM: ULTRASOUND GUIDED THERAPEUTIC PARACENTESIS MEDICATIONS: 10 mL 1% lidocaine COMPLICATIONS: None immediate. PROCEDURE: Informed written consent was obtained from the patient after a discussion of the risks, benefits and alternatives to treatment. A timeout was performed prior to the initiation of the procedure. Initial ultrasound scanning demonstrates a small amount of ascites within the right lower abdominal quadrant. The right lower abdomen was prepped and draped in the usual sterile fashion. 1% lidocaine was used for local anesthesia. Following this, a 19 gauge, 7-cm, Yueh catheter was introduced. An ultrasound image was saved for documentation purposes. The paracentesis was performed. The catheter was removed and a dressing was applied. The patient tolerated the procedure well without immediate post procedural complication. FINDINGS: A total of approximately 2.4 liters of clear, yellow fluid was removed. IMPRESSION: Successful ultrasound-guided paracentesis yielding 2.4 liters of peritoneal fluid. Read by: Loyce Dys PA-C Electronically Signed   By: Corlis Leak M.D.   On: 10/24/2022 10:01   DG Abd Portable 1V  Result Date: 10/23/2022 CLINICAL DATA:  Nausea and vomiting. EXAM: PORTABLE ABDOMEN - 1 VIEW COMPARISON:  Abdominal CT 10/08/2022 FINDINGS: There  is colonic distension, stool within the ascending and splenic flexure of the colon. Air within the transverse colon. There is stool distending the rectum. No evidence of small bowel distension or obstruction. Calcifications in  the pelvis typical of phleboliths. Left pleural effusion is noted. IMPRESSION: 1. Mild colonic distension with stool and air. No small-bowel distention or evidence of obstruction. 2. Left pleural effusion. Electronically Signed   By: Narda Rutherford M.D.   On: 10/23/2022 17:30   IR ABDOMEN US LIMITED  Result Date: 10/21/2022 CLINICAL DATA:  Ascites EXAM: LIMITED ABDOMEN ULTRASOUND FOR ASCITES TECHNIQUE: Limited ultrasound survey for ascites was performed in all four abdominal quadrants. COMPARISON:  None Available. FINDINGS: Focused sonographic exam of the abdomen demonstrates small volume ascites. The patient declined paracentesis. No intervention performed. IMPRESSION: Small volume ascites. The patient declined paracentesis at this time. Electronically Signed   By: Olive Bass M.D.   On: 10/21/2022 16:17   [place] ASCITES (ABDOMEN LIMITED)  Result Date: 10/21/2022 CLINICAL DATA:  Ascites EXAM: LIMITED ABDOMEN ULTRASOUND FOR ASCITES TECHNIQUE: Limited ultrasound survey for ascites was performed in all four abdominal quadrants. COMPARISON:  10/20/2022 FINDINGS: Sonographic evaluation of the abdomen demonstrates mild right abdominal ascites. Trace left abdominal ascites is seen. IMPRESSION: Mild right abdominal ascites. Electronically Signed   By: Acquanetta Belling M.D.   On: 10/21/2022 15:10   IR Paracentesis  Result Date: 10/20/2022 INDICATION: Patient with recently diagnosed metastatic renal cancer with recurrent ascites and pleural effusion. Request for therapeutic paracentesis. EXAM: ULTRASOUND GUIDED THERAPEUTIC PARACENTESIS MEDICATIONS: 6 mL 1% lidocaine COMPLICATIONS: None immediate. PROCEDURE: Informed written consent was obtained from the patient after a discussion of the risks,  benefits and alternatives to treatment. A timeout was performed prior to the initiation of the procedure. Initial ultrasound scanning demonstrates a small amount of ascites within the right upper abdominal quadrant. The right lower abdomen was prepped and draped in the usual sterile fashion. 1% lidocaine was used for local anesthesia. Following this, a 6 Fr Safe-T-Centesis catheter was introduced. An ultrasound image was saved for documentation purposes. The paracentesis was performed. The catheter was removed and a dressing was applied. The patient tolerated the procedure well without immediate post procedural complication. FINDINGS: A total of approximately 1.2 L of cloudy yellow (chylous appearing) fluid was removed. IMPRESSION: Successful ultrasound-guided paracentesis yielding 1.2 liters of peritoneal fluid. Performed by Lynnette Caffey, PA-C Electronically Signed   By: Irish Lack M.D.   On: 10/20/2022 09:55   ECHOCARDIOGRAM COMPLETE  Result Date: 10/17/2022    ECHOCARDIOGRAM REPORT   Patient Name:   Jacob Reynolds Date of Exam: 10/17/2022 Medical Rec #:  [id number]      Height:       71.0 in Accession #:    [id number]     Weight:       148.1 lb Date of Birth:  1965/12/19     BSA:          1.856 m Patient Age:    56 years       BP:           103/79 mmHg Patient Gender: M              HR:           93 bpm. Exam Location:  Inpatient Procedure: 2D Echo, Cardiac Doppler and Color Doppler Indications:    chf  History:        Patient has no prior history of Echocardiogram examinations.  Sonographer:    Mike Gip Referring Phys: [id number] ROBERT SCHERTZ IMPRESSIONS  1. Left ventricular ejection fraction, by estimation, is 45 to 50%. The LV was not well-visualized but does appear abnormal. The left ventricle has mildly  decreased function. The left ventricle demonstrates global hypokinesis. Cardiac MRI may allow a better view and quantification of LV systolic function. Left ventricular diastolic parameters are  consistent with Grade I diastolic dysfunction (impaired relaxation).  2. Right ventricular systolic function is normal. The right ventricular size is normal. Tricuspid regurgitation signal is inadequate for assessing PA pressure.  3. The mitral valve is normal in structure. No evidence of mitral valve regurgitation. No evidence of mitral stenosis.  4. The aortic valve is tricuspid. Aortic valve regurgitation is not visualized. No aortic stenosis is present.  5. Aortic dilatation noted. There is mild dilatation of the aortic root, measuring 38 mm.  6. The inferior vena cava is normal in size with greater than 50% respiratory variability, suggesting right atrial pressure of 3 mmHg. FINDINGS  Left Ventricle: Left ventricular ejection fraction, by estimation, is 45 to 50%. The left ventricle has mildly decreased function. The left ventricle demonstrates global hypokinesis. The left ventricular internal cavity size was normal in size. There is  no left ventricular hypertrophy. Left ventricular diastolic parameters are consistent with Grade I diastolic dysfunction (impaired relaxation). Right Ventricle: The right ventricular size is normal. No increase in right ventricular wall thickness. Right ventricular systolic function is normal. Tricuspid regurgitation signal is inadequate for assessing PA pressure. Left Atrium: Left atrial size was normal in size. Right Atrium: Right atrial size was normal in size. Pericardium: There is no evidence of pericardial effusion. Mitral Valve: The mitral valve is normal in structure. Mild to moderate mitral annular calcification. No evidence of mitral valve regurgitation. No evidence of mitral valve stenosis. Tricuspid Valve: The tricuspid valve is normal in structure. Tricuspid valve regurgitation is not demonstrated. Aortic Valve: The aortic valve is tricuspid. Aortic valve regurgitation is not visualized. No aortic stenosis is present. Pulmonic Valve: The pulmonic valve was normal in  structure. Pulmonic valve regurgitation is not visualized. Aorta: Aortic dilatation noted. There is mild dilatation of the aortic root, measuring 38 mm. Venous: The inferior vena cava is normal in size with greater than 50% respiratory variability, suggesting right atrial pressure of 3 mmHg. IAS/Shunts: No atrial level shunt detected by color flow Doppler.  LEFT VENTRICLE PLAX 2D LVIDd:         4.20 cm      Diastology LVIDs:         2.50 cm      LV e' medial:    5.22 cm/s LV PW:         0.90 cm      LV E/e' medial:  5.8 LV IVS:        1.00 cm      LV e' lateral:   18.10 cm/s LVOT diam:     2.20 cm      LV E/e' lateral: 1.7 LV SV:         50 LV SV Index:   27 LVOT Area:     3.80 cm  LV Volumes (MOD) LV vol d, MOD A2C: 99.4 ml LV vol d, MOD A4C: 107.0 ml LV vol s, MOD A2C: 43.6 ml LV vol s, MOD A4C: 47.5 ml LV SV MOD A2C:     55.8 ml LV SV MOD A4C:     107.0 ml LV SV MOD BP:      60.2 ml RIGHT VENTRICLE             IVC RV Basal diam:  3.10 cm     IVC diam: 1.20 cm RV S prime:  12.10 cm/s TAPSE (M-mode): 1.6 cm LEFT ATRIUM           Index        RIGHT ATRIUM           Index LA diam:      2.70 cm 1.45 cm/m   RA Area:     11.80 cm LA Vol (A4C): 24.1 ml 12.98 ml/m  RA Volume:   22.70 ml  12.23 ml/m  AORTIC VALVE LVOT Vmax:   73.30 cm/s LVOT Vmean:  49.500 cm/s LVOT VTI:    0.132 m  AORTA Ao Root diam: 3.80 cm Ao Asc diam:  3.20 cm MITRAL VALVE MV Area (PHT): 4.12 cm    SHUNTS MV Decel Time: 184 msec    Systemic VTI:  0.13 m MV E velocity: 30.10 cm/s  Systemic Diam: 2.20 cm MV A velocity: 47.40 cm/s MV E/A ratio:  0.64 Dalton McleanMD Electronically signed by Wilfred Lacy Signature Date/Time: 10/17/2022/10:30:22 AM    Final    DG Chest Port 1 View  Result Date: 10/15/2022 CLINICAL DATA:  Pleural effusion on the left. Status post left thoracentesis. EXAM: PORTABLE CHEST 1 VIEW COMPARISON:  10/13/2022 FINDINGS: Stable position of the right jugular Port-A-Cath with the tip near the superior cavoatrial junction.  Again noted are diffuse interstitial lung densities, right side slightly greater than left. Slightly improved aeration at the left lung base compared to the recent comparison examination. Probable small bilateral pleural effusions or blunting at the costophrenic angles. Negative for a pneumothorax. Heart size is normal. Trachea is midline. IMPRESSION: 1. Negative for a pneumothorax following the left thoracentesis. 2. Slightly improved aeration at the left lung base compared to the recent comparison examination. 3. Cannot exclude small bilateral pleural effusions. 4. Persistent prominent interstitial lung markings. Electronically Signed   By: Richarda Overlie M.D.   On: 10/15/2022 14:11   US THORACENTESIS ASP PLEURAL SPACE W/IMG GUIDE  Result Date: 10/15/2022 INDICATION: Patient with history of renal cell carcinoma, recurrent ascites and left pleural effusion. Request for diagnostic and therapeutic left thoracentesis. EXAM: ULTRASOUND GUIDED LEFT THORACENTESIS MEDICATIONS: 5 mL 1% lidocaine COMPLICATIONS: None immediate. PROCEDURE: An ultrasound guided thoracentesis was thoroughly discussed with the patient and questions answered. The benefits, risks, alternatives and complications were also discussed. The patient understands and wishes to proceed with the procedure. Written consent was obtained. Ultrasound was performed to localize and mark an adequate pocket of fluid in the left chest. The area was then prepped and draped in the normal sterile fashion. 1% Lidocaine was used for local anesthesia. Under ultrasound guidance a 6 Fr Safe-T-Centesis catheter was introduced. Thoracentesis was performed. The catheter was removed and a dressing applied. FINDINGS: A total of approximately 520 mL of cloudy yellow, chylous appearing fluid was removed. Samples were sent to the laboratory as requested by the clinical team. IMPRESSION: Successful ultrasound guided left thoracentesis yielding 520 mL of pleural fluid. Performed by  Lynnette Caffey, PA-C Electronically Signed   By: Simonne Come M.D.   On: 10/15/2022 14:10   IR Paracentesis  Result Date: 10/14/2022 INDICATION: 57 year old male recently diagnosed with metastatic renal cancer. Found to have ascites. Request for therapeutic and diagnostic paracentesis EXAM: ULTRASOUND GUIDED THERAPEUTIC AND DIAGNOSTIC LEFT SIDED PARACENTESIS MEDICATIONS: Lidocaine % 10 mL. COMPLICATIONS: None immediate. PROCEDURE: Informed written consent was obtained from the patient after a discussion of the risks, benefits and alternatives to treatment. A timeout was performed prior to the initiation of the procedure. Initial ultrasound scanning demonstrates a moderate  amount of ascites within the left lower abdominal quadrant. The left lower abdomen was prepped and draped in the usual sterile fashion. 1% lidocaine was used for local anesthesia. Following this, a 19 gauge, 7-cm, Yueh catheter was introduced. An ultrasound image was saved for documentation purposes. The paracentesis was performed. The catheter was removed and a dressing was applied. The patient tolerated the procedure well without immediate post procedural complication. FINDINGS: A total of approximately 3.9 L of cloudy yellow fluid was removed. Samples were sent to the laboratory as requested by the clinical team. IMPRESSION: Successful ultrasound-guided therapeutic and diagnostic paracentesis yielding 3.9 liters of peritoneal fluid. Read by: Anders Grant, NP Electronically Signed   By: Richarda Overlie M.D.   On: 10/14/2022 16:34   CT SOFT TISSUE NECK WO CONTRAST  Result Date: 10/13/2022 CLINICAL DATA:  Shortness of breath and difficulty breathing. Feels like throat closing. EXAM: CT NECK WITHOUT CONTRAST TECHNIQUE: Multidetector CT imaging of the neck was performed following the standard protocol without intravenous contrast. RADIATION DOSE REDUCTION: This exam was performed according to the departmental dose-optimization program which  includes automated exposure control, adjustment of the mA and/or kV according to patient size and/or use of iterative reconstruction technique. COMPARISON:  Neck radiographs obtained earlier the same day. Cervical spine CT 11/09/2019, PET-CT 07/28/2022. FINDINGS: Pharynx and larynx: The nasal cavity and nasopharynx are unremarkable. The oral cavity and oropharynx are unremarkable. The parapharyngeal spaces are clear. The hypopharynx and larynx are unremarkable. The epiglottis is normal. There is no retropharyngeal fluid collection. The airway is widely patent throughout. Salivary glands: The parotid and submandibular glands are unremarkable. Thyroid: Unremarkable. Lymph nodes: There is an enlarged left supraclavicular lymph measuring up to 1.2 cm in short axis. There are additional smaller left supraclavicular lymph nodes more inferiorly measuring up to 0.6-0.7 cm. These nodes appear increased in size since the PET-CT from 07/28/2022 but similar in size compared to the recent chest CT from 08/09/2022. There is no pathologic lymphadenopathy on the right. Vascular: Grossly unremarkable, within the confines of noncontrast technique. A right chest wall port is partially imaged. Limited intracranial: Unremarkable. Visualized orbits: Unremarkable. Mastoids and visualized paranasal sinuses: The imaged paranasal sinuses are clear. The mastoid air cells and middle ear cavities are clear. Skeleton: There is no acute osseous abnormality or suspicious osseous lesion. Upper chest: Nodular opacity in the lung apices is similar to the recent chest CT from 10/08/2022. The left pleural effusion appears decreased in volume but remains at least moderate in size. Other: None. IMPRESSION: 1. No acute finding along the aerodigestive tract. Widely patent airway throughout. 2. Left supraclavicular lymph nodes are increased in size since the PET-CT from 07/28/2022 but stable in size compared to the recent chest CT. 3. The left pleural  effusion is decreased in volume compared to the prior chest CT following interval thoracentesis but remains at least moderate in volume. Electronically Signed   By: Lesia Hausen M.D.   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 Ventilation scans intentionally deferred if perfusion scan and chest x-ray adequate for interpretation during COVID 19 epidemic. RADIOPHARMACEUTICALS:  3.7 mCi Tc-78m MAA IV COMPARISON:  Chest radiograph 10/13/2022 FINDINGS: Slight blunting of the LEFT costophrenic angle by tiny effusion. No segmental or subsegmental perfusion  defects identified to suggest pulmonary embolism. IMPRESSION: No evidence of pulmonary embolism. Electronically Signed   By: Ulyses Southward M.D.   On: 10/13/2022 10:18   DG Neck Soft Tissue  Result Date: 10/13/2022 CLINICAL DATA:  Sore throat EXAM: NECK SOFT TISSUES - 1+ VIEW COMPARISON:  None Available. FINDINGS: There is no evidence of retropharyngeal soft tissue swelling or epiglottic enlargement. The cervical airway is unremarkable and no radio-opaque foreign body identified. Moderate degenerative changes C5-C6 and C6-C7 IMPRESSION: Negative. Electronically Signed   By: Jasmine Pang M.D.   On: 10/13/2022 03:37   DG Chest Port 1 View  Result Date: 10/13/2022 CLINICAL DATA:  Shortness of breath EXAM: PORTABLE CHEST 1 VIEW COMPARISON:  10/09/2022 CT 10/08/2022 FINDINGS: Right-sided central venous port tip over the right atrium. Small left-sided pleural effusion. Diffuse interstitial and ground-glass opacity, mildly increased compared to prior, could represent edema superimposed on interstitial changes seen on the prior chest CT. Stable cardiomediastinal silhouette. No  pneumothorax IMPRESSION: Diffuse interstitial and ground-glass opacity, mildly increased compared to prior, could represent edema superimposed on interstitial changes seen on prior chest CT (possibility of lymphangitic disease raised). Small left effusion. Electronically Signed   By: Jasmine Pang M.D.   On: 10/13/2022 03:37   US Paracentesis  Result Date: 10/09/2022 INDICATION: History of stage IV renal cancer presents with abdominal distension. Previous imaging showed large ascites. Request for therapeutic and diagnostic paracentesis. EXAM: ULTRASOUND GUIDED  PARACENTESIS MEDICATIONS: 10 mL 1% lidocaine COMPLICATIONS: None immediate. PROCEDURE: Informed written consent was obtained from the patient after a discussion of the risks, benefits and alternatives to treatment. A timeout was performed prior to the initiation of the procedure. Initial ultrasound scanning demonstrates a large amount of ascites within the right lower abdominal quadrant. The right lower abdomen was prepped and draped in the usual sterile fashion. 1% lidocaine was used for local anesthesia. Following this, a 19 gauge, 7-cm, Yueh catheter was introduced. An ultrasound image was saved for documentation purposes. The paracentesis was performed. The catheter was removed and a dressing was applied. The patient tolerated the procedure well without immediate post procedural complication. FINDINGS: A total of approximately 6.1 L of cream colored fluid was removed. Samples were sent to the laboratory as requested by the clinical team. IMPRESSION: Successful ultrasound-guided paracentesis yielding 6.1 liters of peritoneal fluid. Read by: Lawernce Ion, PA-C Electronically Signed   By: Malachy Moan M.D.   On: 10/09/2022 15:54   US THORACENTESIS ASP PLEURAL SPACE W/IMG GUIDE  Result Date: 10/09/2022 INDICATION: Patient with history of stage IV renal cancer presents with shortness of breath. Previous CT showed bilateral pleural effusion left  greater than right. Request for therapeutic and diagnostic thoracentesis. EXAM: ULTRASOUND GUIDED LEFT THORACENTESIS MEDICATIONS: 10 mL 1% lidocaine COMPLICATIONS: None immediate. PROCEDURE: An ultrasound guided thoracentesis was thoroughly discussed with the patient and questions answered. The benefits, risks, alternatives and complications were also discussed. The patient understands and wishes to proceed with the procedure. Written consent was obtained. Ultrasound was performed to localize and mark an adequate pocket of fluid in the left chest. The area was then prepped and draped in the normal sterile fashion. 1% Lidocaine was used for local anesthesia. Under ultrasound guidance a 6 Fr Safe-T-Centesis catheter was introduced. Thoracentesis was performed. The catheter was removed and a dressing applied. FINDINGS: A total of approximately 1.6 L of cream colored fluid was removed. Samples were sent to the laboratory as requested by the clinical team. Post procedure chest X-ray reviewed, negative for  pneumothorax. IMPRESSION: Successful ultrasound guided left thoracentesis yielding 1.6 L of pleural fluid. Read by: Lawernce Ion, PA-C Electronically Signed   By: Malachy Moan M.D.   On: 10/09/2022 15:54   DG Chest 1 View  Result Date: 10/09/2022 CLINICAL DATA:  Status post thoracentesis. EXAM: CHEST  1 VIEW COMPARISON:  None Available. FINDINGS: A right Port-A-Cath is in good position. A small left pleural effusion with underlying atelectasis is identified. No pneumothorax after thoracentesis. The cardiomediastinal silhouette is normal. Increased interstitial markings bilaterally. No other acute abnormalities. IMPRESSION: 1. No pneumothorax after thoracentesis. 2. Small left pleural effusion with underlying atelectasis. 3. Increased interstitial markings bilaterally may represent edema. Electronically Signed   By: Gerome Sam III M.D.   On: 10/09/2022 11:28   CT CHEST WO CONTRAST  Result Date:  10/08/2022 CLINICAL DATA:  Evaluate for malignancy. History of stage IV renal cell carcinoma. * Tracking Code: BO * EXAM: CT CHEST WITHOUT CONTRAST TECHNIQUE: Multidetector CT imaging of the chest was performed following the standard protocol without IV contrast. RADIATION DOSE REDUCTION: This exam was performed according to the departmental dose-optimization program which includes automated exposure control, adjustment of the mA and/or kV according to patient size and/or use of iterative reconstruction technique. COMPARISON:  PET-CT from 07/28/2022 CT chest from 07/15/2022. MR thoracic spine 08/30/2022. FINDINGS: Cardiovascular: Heart size is normal. Aortic atherosclerosis and coronary artery calcifications. No pericardial effusion. Mediastinum/Nodes: Thyroid gland and trachea appear normal. There is new circumferential wall thickening involving the mid and distal esophagus. Previous tracer avid left supraclavicular lymph node measures 1.1 cm, image 10/2. Previously 0.8 cm. Previous tracer avid left internal mammary lymph node measures 5 mm, image 86/2. previously 6 mm. The previously reference tracer avid hilar lymph nodes are suboptimally visualized on today's exam reflecting lack of IV contrast material. Right paratracheal lymph node measures 1.6 cm, image 55/2. Formally 0.6 cm. Previous tracer avid subcarinal lymph node measures 1.1 cm, image 60/2. Previously 1 cm. Lungs/Pleura: New small right pleural effusion. Moderate to large left pleural effusion is identified. Previously trace. Interlobular septal thickening is identified within both lungs which has a lower lung zone predominance. There are multiple new, subtle tiny lung nodules identified all measuring on the order of 2-3 mm. Some of these have a perilymphatic distribution within the subpleural and perifissural lungs. Larger, subpleural nodule within the anterior left apex is new from the previous exam measuring 2 x 2.1 cm, image 26/2. Upper Abdomen:  Limited imaging through the upper abdomen shows significant interval increase and abdominal ascites. Peritoneal nodularity identified compatible with previously demonstrated metastasis. Left adrenal gland metastasis measures 6.1 x 4.6 cm on today's study. This is compared with 5.1 x 3.9 cm previously. Musculoskeletal: Multifocal bone metastases are better seen on the PET-CT from 10/26/2022 and MRI from 08/30/2022. Similar appearance of compression deformity involving the T6 vertebra. IMPRESSION: 1. Interval progression of disease. 2. Interval increase in size of left supraclavicular, right paratracheal and subcarinal lymph nodes. 3. New small right pleural effusion. Significant increase in volume of the left pleural effusion which is now moderate to large in volume. 4. Multiple new, subtle tiny lung nodules are identified all measuring on the order of 2-3 mm. Some of these have a perilymphatic distribution within the subpleural and perifissural lungs. Additionally, there is new interlobular septal thickening. Although nonspecific, septal thickening may be seen in the setting of lymphangitic spread of disease within the lungs. 5. New 2.1 cm large nodule identified within the anterior left apex concerning  for metastasis. 6. Interval increase in size of left adrenal gland metastasis. 7. Interval increase in abdominal ascites with peritoneal nodularity compatible with peritoneal carcinomatosis. 8. New circumferential wall thickening involving the mid and distal esophagus. Correlate for any clinical signs or symptoms of esophagitis. 9. Multifocal bone metastases are better seen on the PET-CT from 10/26/2022 and MRI from 08/30/2022. 10.  Aortic Atherosclerosis (ICD10-I70.0). Electronically Signed   By: Signa Kell M.D.   On: 10/08/2022 08:35   CT ABDOMEN PELVIS WO CONTRAST  Result Date: 10/08/2022 CLINICAL DATA:  Nausea vomiting EXAM: CT ABDOMEN AND PELVIS WITHOUT CONTRAST TECHNIQUE: Multidetector CT imaging of the  abdomen and pelvis was performed following the standard protocol without IV contrast. RADIATION DOSE REDUCTION: This exam was performed according to the departmental dose-optimization program which includes automated exposure control, adjustment of the mA and/or kV according to patient size and/or use of iterative reconstruction technique. COMPARISON:  CT 07/15/2022, PET CT 07/28/2022 FINDINGS: Lower chest: Lung bases demonstrate incompletely visualized large left pleural effusion. Small right-sided pleural effusion and dependent atelectasis at the right base. Suspicion of mild peribronchovascular nodularity in the right lower lobe, for example series 5, image 18. Hepatobiliary: No calcified gallstone or biliary dilatation. No focal hepatic abnormality allowing for absence of contrast. Pancreas: No pancreatic inflammatory change or ductal dilatation Spleen: Normal in size without focal abnormality. Adrenals/Urinary Tract: Right adrenal gland is normal. Large left adrenal mass measuring 6.3 x 4.6 cm, previously 3.7 x 3.6 cm. Numerous soft tissue nodules in the left Peri renal space concerning for metastatic disease. Overall decreased size of left kidney. Poorly defined upper pole mass appears slightly smaller in the interim, possibly measuring 6.4 by 5.7 cm, previously 8 cm. Mild left hydronephrosis without visible stone. Decompressed urinary bladder. Right kidney shows no hydronephrosis. Stomach/Bowel: The stomach is nonenlarged. No dilated small bowel. No acute bowel wall thickening. Vascular/Lymphatic: Moderate aortic atherosclerosis. No aneurysm. Extensive retroperitoneal adenopathy, though slightly decreased compared to the prior CT. Multiple small pelvic sidewall lymph nodes. Reproductive: Negative prostate Other: No free air. Interim increase in abdominopelvic ascites, now large. Extensive peritoneal metastatic disease with omental caking and diffuse peritoneal nodularity. Musculoskeletal: No acute osseous  abnormality IMPRESSION: 1. Incompletely visualized large left-sided pleural effusion with passive atelectasis at the left base. Small right-sided pleural effusion. New suspected peribronchovascular nodularity in the right lower lobe raising concern for possible lymphangitic spread of tumor 2. No evidence for a bowel obstruction. Increased abdominopelvic ascites now large in volume. 3. Poorly defined mass upper pole left kidney corresponding to history of carcinoma. The mass itself appears smaller compared to the prior CT. Findings consistent with advanced metastatic disease including extensive peritoneal and retroperitoneal metastatic disease. Increased size of left adrenal metastatic lesion. Aortic Atherosclerosis (ICD10-I70.0). Electronically Signed   By: Jasmine Pang M.D.   On: 10/08/2022 03:46    Microbiology: No results found for this or any previous visit (from the past 240 hour(s)).   Signed: Thad Ranger, MD 10/12/2022

## 2022-11-12 NOTE — Progress Notes (Signed)
Patient request a visit from the chaplain. Placed call to the on call Chaplain.

## 2022-11-12 NOTE — Progress Notes (Addendum)
Niece and nephew advised to take patient belongings. Took cell phone, shaver and batteries. Left clothes and drinks in closet. Items bagged and placed at [employer].    Noted with no respirations, blood pressure or heart rate at 1012. Verified with charge nurse. MD and palliative NP made aware. Family at bedside.   Nephew David instructed staff to throw clothing and wedge pillows away, all other belongings at desk to be picked up Monday 10/31/22

## 2022-11-12 NOTE — Progress Notes (Signed)
   10/13/2022 0100  Spiritual Encounters  Type of Visit Initial  Care provided to: Patient  Conversation partners present during encounter Other (comment) (2 NT)  Referral source Patient request  Reason for visit End-of-life  OnCall Visit Yes  Spiritual Framework  Presenting Themes Goals in life/care;Values and beliefs;Significant life change;Impactful experiences and emotions  Values/beliefs Belief in God as a good God, belief in a "better place"  Community/Connection Family;Friend(s)  Needs/Challenges/Barriers fear of death, anxious  Patient Stress Factors Exhausted;Health changes  Family Stress Factors Not reviewed  Interventions  Spiritual Care Interventions Made Established relationship of care and support;Compassionate presence;Reflective listening;Normalization of emotions;Explored values/beliefs/practices/strengths;Bereavement/grief support;Prayer;Encouragement  Intervention Outcomes  Outcomes Connection to spiritual care;Awareness around self/spiritual resourses;Awareness of support;Reduced anxiety;Reduced fear  Spiritual Care Plan  Spiritual Care Issues Still Outstanding No further spiritual care needs at this time (see row info)  Recommendations for Clinical Staff Contact spiritaul care as requested by the patient.  Follow up plan  Chaplains available per patient or family request   Patient requested chaplain presence at bedside. Patient was fearful that he "would not make it through the night." Patient requested god to let him live so that he could see his family who plan to come 11/06/2022. Chaplain provided emotional and spiritual support. Patient became less fearful and anxious. Follow up as requested by patient or family.   Arlyce Dice, Chaplain Resident [phone]

## 2022-11-12 NOTE — Progress Notes (Signed)
Per Primary Nurse, patient expired, death  verified . Attending MD made aware by primary nurse.Time of Death 1012. [place] [facility] called and patient potential for eye donor. Primary nurse made aware to do eye prep prior to bringing body to the morgue. Awaiting family to give their last respect.

## 2022-11-12 NOTE — Plan of Care (Deleted)

## 2022-11-12 NOTE — Progress Notes (Signed)
Daily Progress Note   Patient Name: Jacob Reynolds       Date: 11/05/2022 DOB: 1966/05/11  Age: 57 y.o. MRN#: [id number] Attending Physician: Cathren Harsh, MD Primary Care Physician: [name], FNP Admit Date: 10/13/2022  Reason for Consultation/Follow-up: Establishing goals of care  Subjective: Medical records reviewed including progress notes, labs, and imaging. Patient assessed at the bedside.  He appears restless, occasionally moans in response to this PA but does not seem to understand why I am or what I am saying.  No family present during my visit.  Discussed with RN. Recommended as needed Ativan.  I then called patient's nephew Jacob Reynolds to share his current condition and recommend urgent visitation, as he is likely to die today.  I attempted to call his friend Jacob Reynolds but was unable to reach her and her voicemail was full.  Jacob Reynolds states that he is leaving to go to the hospital as soon as possible.    Unfortunately, I then received an update from RN about an hour later that patient has passed away.  I called patient's nephew again and provided update as well as my condolences.  He shares that he would no longer like to come see the patient after his death.  His sister, patient's niece, then called and shared that she would like to sit with patient for a few minutes to pay her respects.  Emotional support and therapeutic listening was provided.  Questions and concerns addressed. PMT will continue to support holistically.   Length of Stay: 16  Physical Exam Vitals and nursing note reviewed.  Constitutional:      Appearance: He is ill-appearing.  Cardiovascular:     Rate and Rhythm: Normal rate.  Pulmonary:     Effort: Tachypnea present.  Abdominal:     General: There is  distension.  Neurological:     Mental Status: He is alert and oriented to person, place, and time.  Psychiatric:        Behavior: Behavior is agitated.            Vital Signs: BP (!) 83/72 (BP Location: Left Arm)   Pulse 98   Temp (!) 97.3 F (36.3 C) (Oral)   Resp 12   Ht 5\' 11"  (1.803 m)   Wt 67.5 kg   SpO2 92%  BMI 20.75 kg/m  SpO2: SpO2: 92 % O2 Device: O2 Device: Nasal Cannula O2 Flow Rate: O2 Flow Rate (L/min): 7 L/min      Palliative Assessment/Data: 10%    Palliative Care Assessment & Plan   Patient Profile: 57 y.o. 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    Patient was recently hospitalized 4/27-4/29 with intractable nausea/vomiting and abdominal pain, ascites and pleural effusion requiring paracentesis along with thoracentesis. He is now re-admitted for possible CAP and acute on chronic hyponatremia,  PMT has been consulted to assist with goals of care conversation.  Assessment: Goals of care conversation Stage IV renal cancer with mets to bone Hyponatremia refractory to treatment Recurrent ascites HFrEF Acute hypoxic respiratory failure  Recommendations/Plan: Continue DNR/DNI Continue comfort focused care Ordered Dilaudid drip with as needed boluses for breakthrough pain/dyspnea Ordered IV Ativan as needed for restlessness/agitation/anxiety Psychosocial and emotional support provided PMT will continue to follow and support   Prognosis:  Hours - Days  Discharge Planning: Anticipated Hospital Death  Care plan was discussed with Patient, patient's nephew, patient's niece, RN   MDM high         Sammi Stolarz Jeni Salles, PA-C  Palliative Medicine Team Team phone # [phone]  Thank you for allowing the Palliative Medicine Team to assist in the care of this patient. Please utilize secure chat with additional  questions, if there is no response within 30 minutes please call the above phone number.  Palliative Medicine Team providers are available by phone from 7am to 7pm daily and can be reached through the team cell phone.  Should this patient require assistance outside of these hours, please call the patient's attending physician.

## 2022-11-12 NOTE — Plan of Care (Signed)
  Problem: Education: Goal: Knowledge of General Education information will improve Description: Including pain rating scale, medication(s)/side effects and non-pharmacologic comfort measures 10/18/2022 0227 by Tanja Port, RN Outcome: Progressing 10/14/2022 0224 by Tanja Port, RN Outcome: Progressing   Problem: Health Behavior/Discharge Planning: Goal: Ability to manage health-related needs will improve 10/12/2022 0227 by Tanja Port, RN Outcome: Not Progressing 10/15/2022 0224 by Tanja Port, RN Outcome: Progressing   Problem: Clinical Measurements: Goal: Ability to maintain clinical measurements within normal limits will improve Outcome: Progressing Goal: Will remain free from infection Outcome: Progressing Goal: Diagnostic test results will improve Outcome: Progressing Goal: Respiratory complications will improve Outcome: Progressing Goal: Cardiovascular complication will be avoided Outcome: Progressing   Problem: Activity: Goal: Risk for activity intolerance will decrease Outcome: Progressing   Problem: Nutrition: Goal: Adequate nutrition will be maintained 10/22/2022 0227 by Tanja Port, RN Outcome: Not Progressing 10/14/2022 0224 by Tanja Port, RN Outcome: Progressing   Problem: Coping: Goal: Level of anxiety will decrease 10/19/2022 0227 by Tanja Port, RN Outcome: Progressing 10/24/2022 0224 by Tanja Port, RN Outcome: Progressing   Problem: Elimination: Goal: Will not experience complications related to bowel motility Outcome: Progressing Goal: Will not experience complications related to urinary retention Outcome: Progressing   Problem: Pain Managment: Goal: General experience of comfort will improve 11/03/2022 0227 by Tanja Port, RN Outcome: Progressing 10/27/2022 0224 by Tanja Port, RN Outcome: Progressing   Problem: Safety: Goal: Ability to remain free from injury will improve 10/22/2022 0227 by Tanja Port, RN Outcome: Progressing 10/26/2022 0224  by Tanja Port, RN Outcome: Progressing   Problem: Skin Integrity: Goal: Risk for impaired skin integrity will decrease 10/28/2022 0227 by Tanja Port, RN Outcome: Progressing 10/15/2022 0224 by Tanja Port, RN Outcome: Progressing   Problem: Education: Goal: Knowledge of the prescribed therapeutic regimen will improve 10/22/2022 0227 by Tanja Port, RN Outcome: Progressing 10/20/2022 0224 by Tanja Port, RN Outcome: Progressing   Problem: Respiratory: Goal: Verbalizations of increased ease of respirations will increase 10/23/2022 0227 by Tanja Port, RN Outcome: Progressing 10/18/2022 0224 by Tanja Port, RN Outcome: Progressing

## 2022-11-12 DEATH — deceased

## 2022-11-21 ENCOUNTER — Other Ambulatory Visit: Payer: Self-pay | Admitting: Hematology

## 2022-11-21 DIAGNOSIS — C649 Malignant neoplasm of unspecified kidney, except renal pelvis: Secondary | ICD-10-CM

## 2023-03-11 NOTE — Progress Notes (Signed)
Seen by casting department

## 2024-03-14 IMAGING — US US RENAL
1 series · 14 of 25 positions shown · non-contrast
Comparison: CT scan August 31, 2021

CLINICAL DATA: Fullness in the left kidney on the CT scan from
August 31, 2021.

EXAM:
RENAL / URINARY TRACT ULTRASOUND COMPLETE

[Series 1: us renal · 14 of 76 slices shown]
[im 1/76]
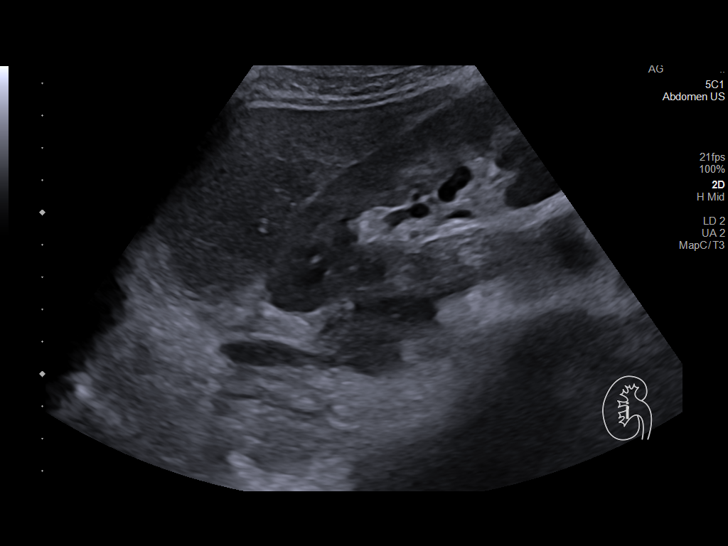
[im 7/76]
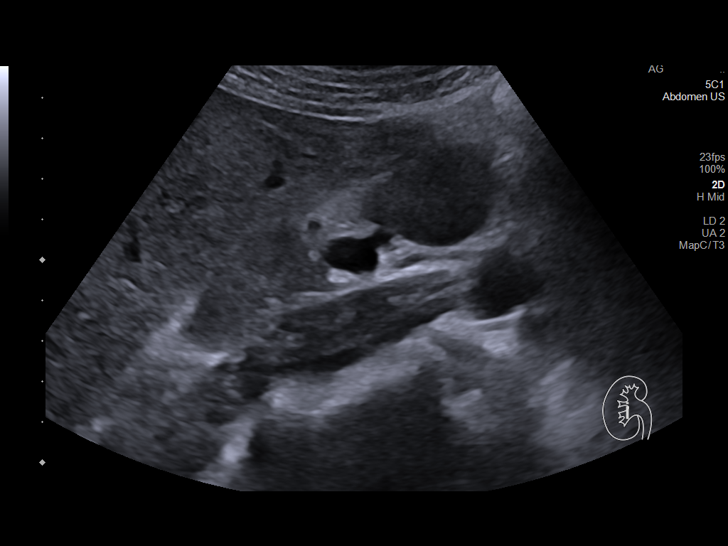
[im 13/76]
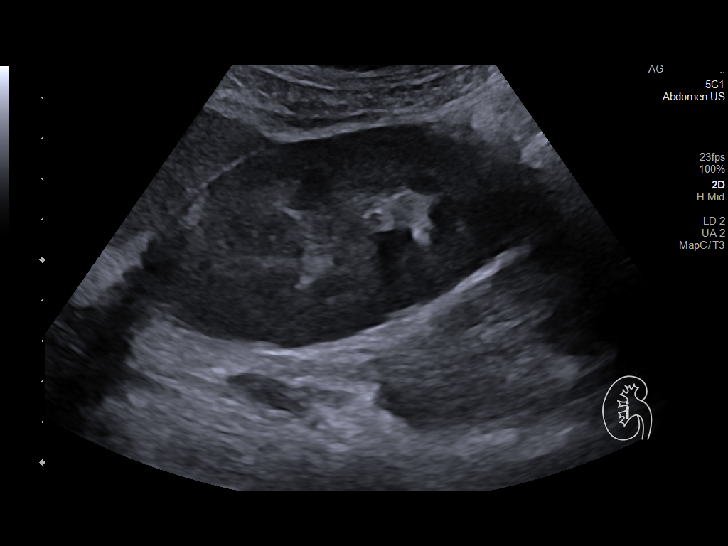
[im 19/76]
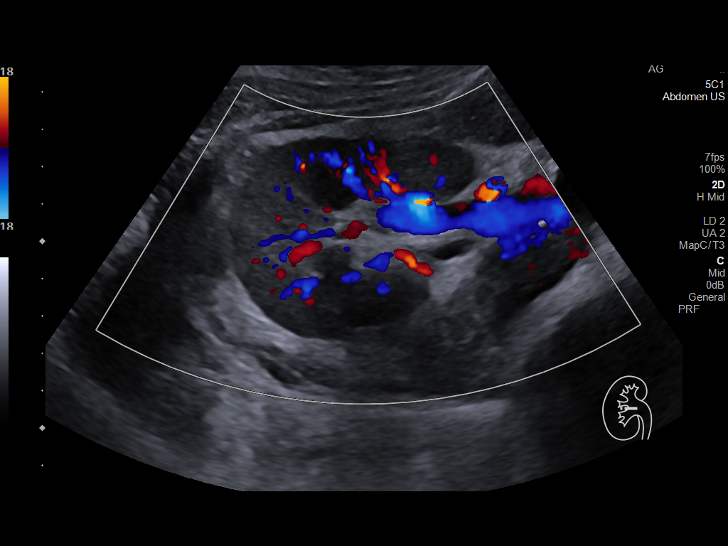
[im 26/76]
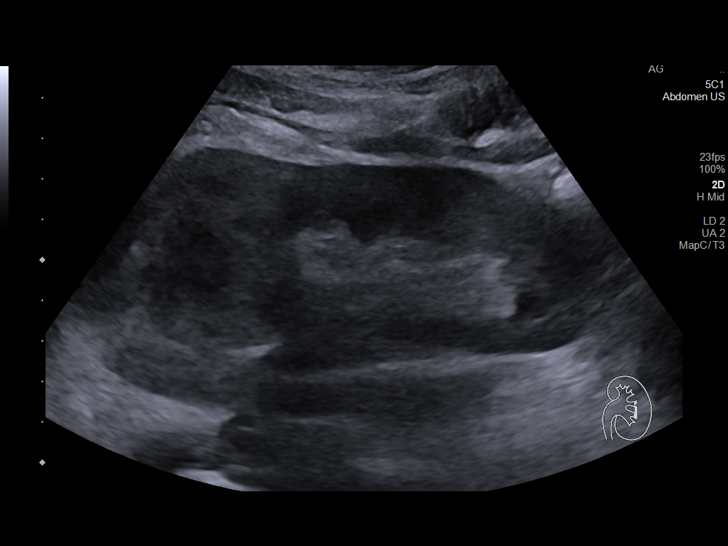
[im 29/76]
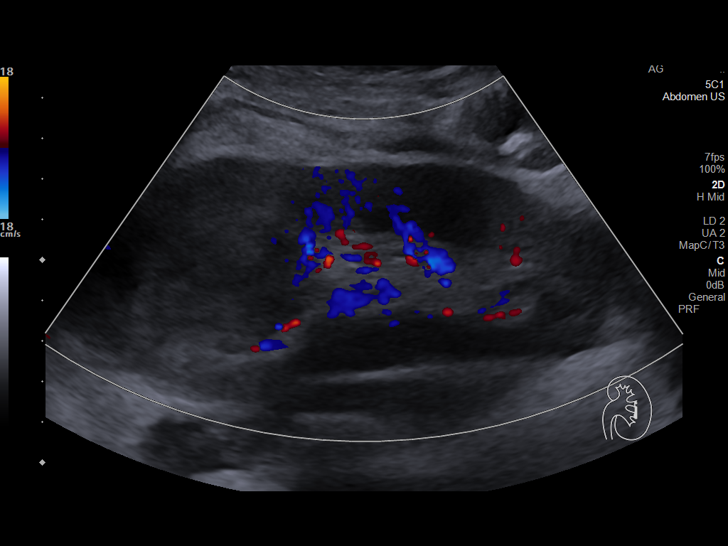
[im 35/76]
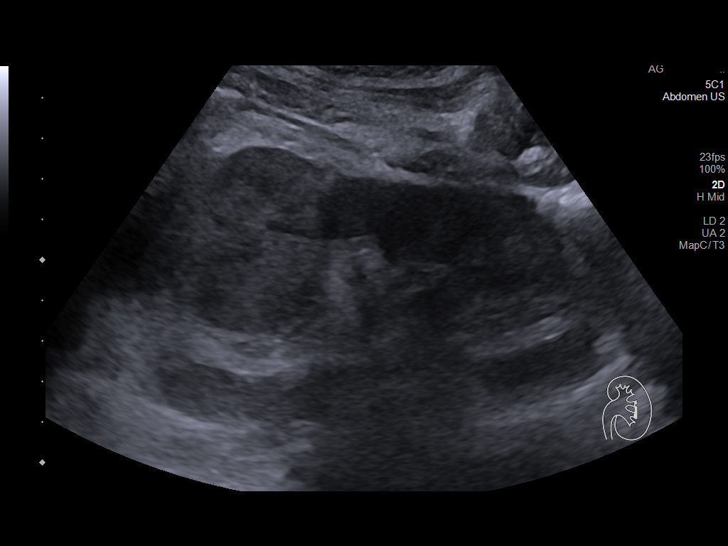
[im 41/76]
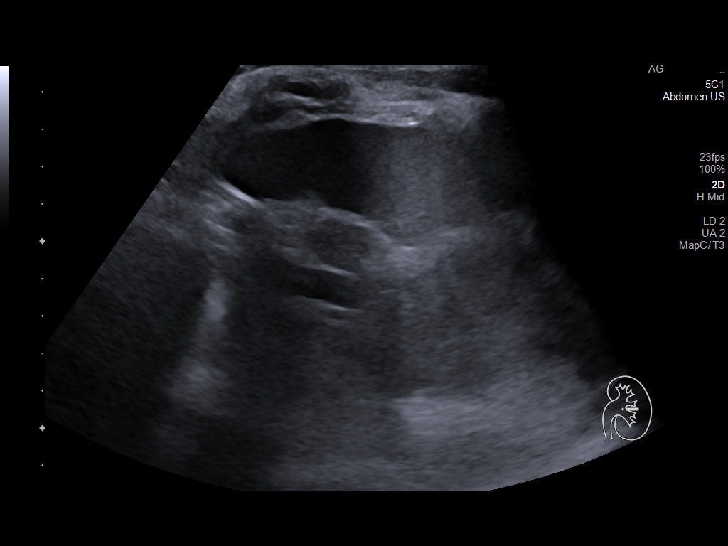
[im 47/76]
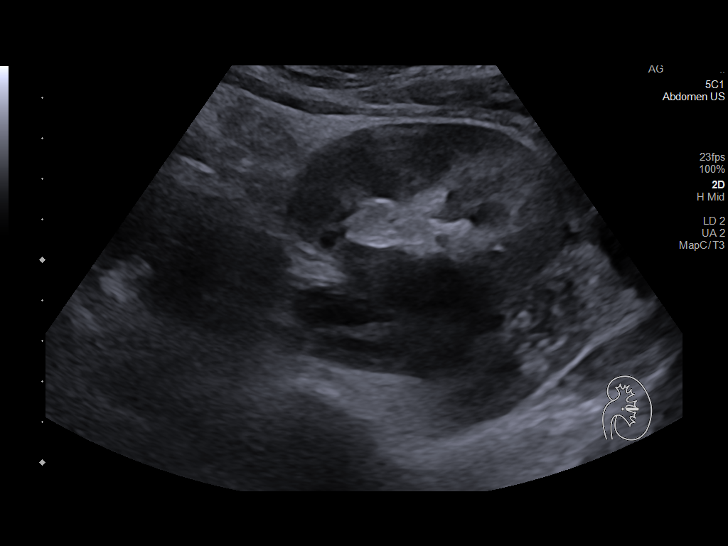
[im 51/76]
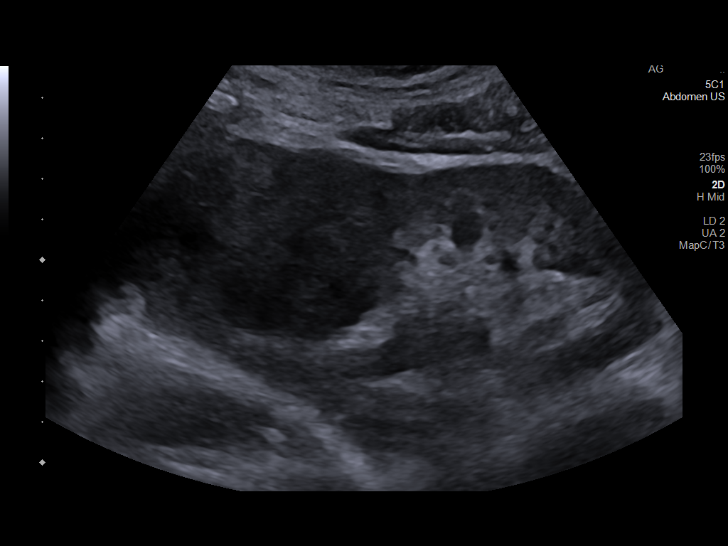
[im 57/76]
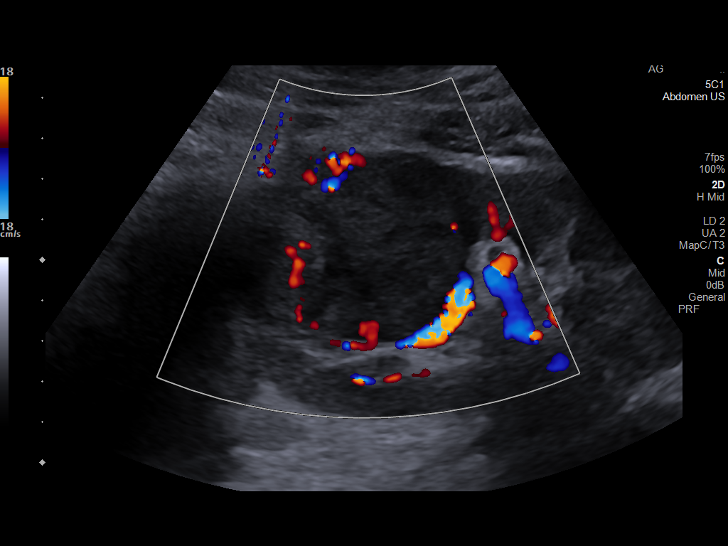
[im 63/76]
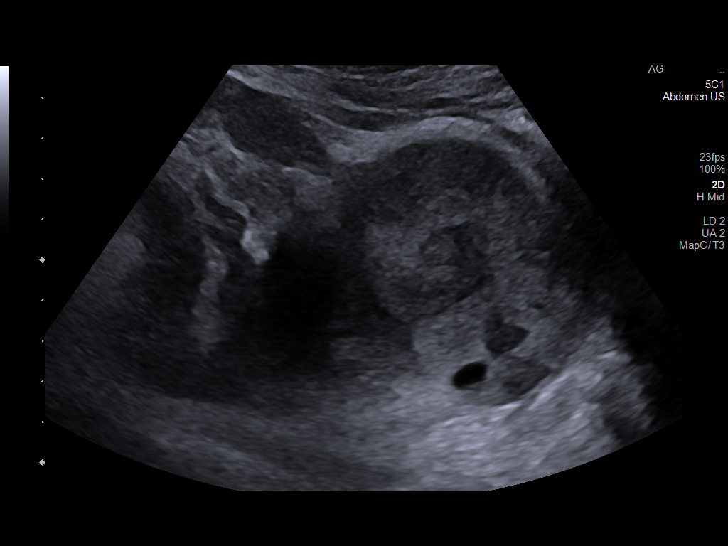
[im 69/76]
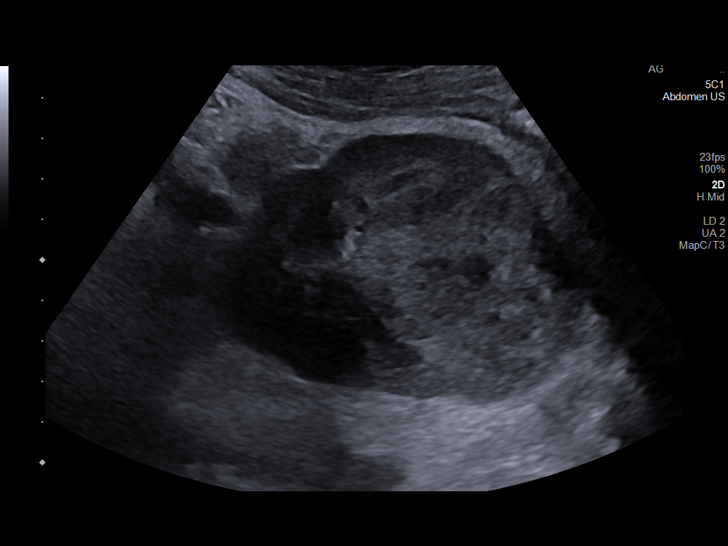
[im 76/76]
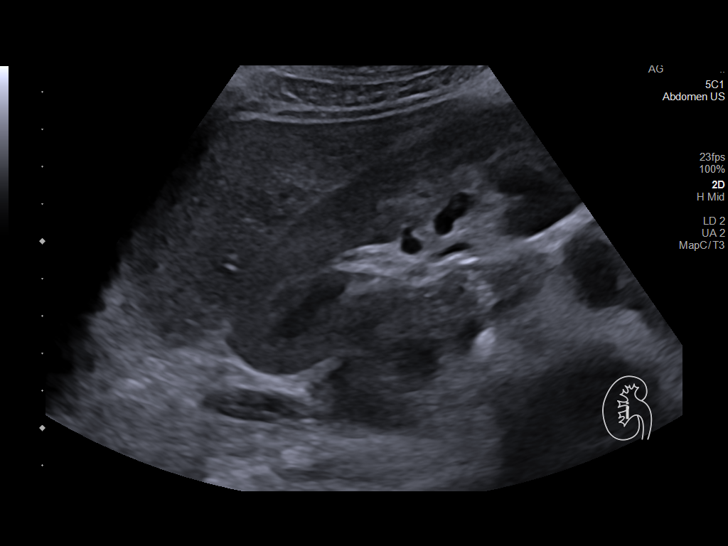

[14 of 25 positions shown; findings below may reference images not displayed]

FINDINGS: Right Kidney:

Renal measurements: 11.6 x 4.3 x 6.5 cm = volume: 172 mL.
Echogenicity within normal limits. No mass or hydronephrosis
visualized.

Left Kidney:

Renal measurements: 12.7 x 5.3 x 5.3 cm = volume: 189 mL. There is a
5.8 x 4.9 x 6.1 cm mass with solid components in the upper pole of
the left kidney. Internal blood flow is identified.

Bladder:

Appears normal for degree of bladder distention.

Other:

None.
IMPRESSION: 1. There is a mass with solid components in the upper pole of the
left kidney measuring up to 6.1 cm. This finding is highly
concerning for a renal cell carcinoma. Recommend MRI for more
complete characterization.
2. The right kidney and bladder are unremarkable.

These results will be called to the ordering clinician or
representative by the Radiologist Assistant, and communication
documented in the PACS or [REDACTED].

## 2024-03-21 IMAGING — MR MR ABDOMEN WO/W CM
20 series · 48 of 48 positions shown · IV contrast (gadavist)
Comparison: 09/15/2021

CLINICAL DATA: Left renal mass

EXAM:
MRI ABDOMEN WITHOUT AND WITH CONTRAST
TECHNIQUE: Multiplanar multisequence MR imaging of the abdomen was performed
both before and after the administration of intravenous contrast.
CONTRAST:  7mL GADAVIST GADOBUTROL 1 MMOL/ML IV SOLN

[Series 3: cor haste · coronal · 6.0mm · 1.19mm/px · 1 of 34 slices shown]
[im 1/34]
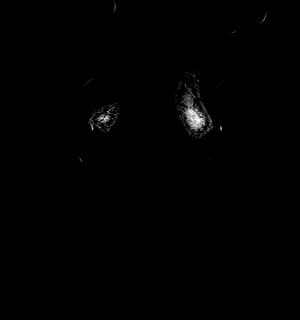

[Series 4: ax haste · axial · 6.0mm · 1.16mm/px · 1 of 37 slices shown]
[im 1/37]
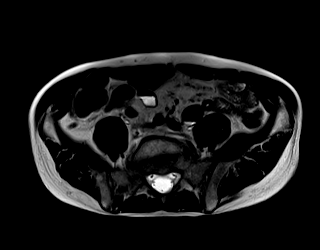

[Series 6: T2 fat-sat · axial · 6.0mm · 1.19mm/px · 1 of 35 slices shown]
[im 1/35]
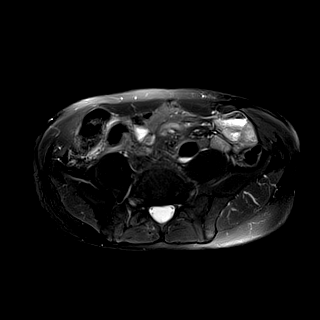

[Series 7: DWI · axial · 6.0mm · 1.38mm/px · z∈[-171,+146]mm · 2 of 45 slices shown (1 of 4)]
[im 1/45]
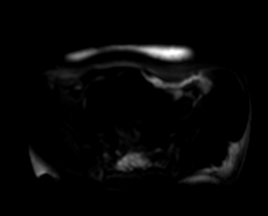
[im 45/45]
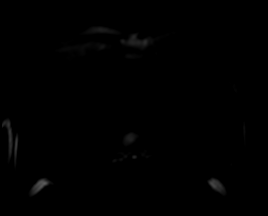

[Series 7: DWI · axial · 6.0mm · 1.38mm/px · z∈[-171,+146]mm · 2 of 45 slices shown (2 of 4)]
[im 1/45]
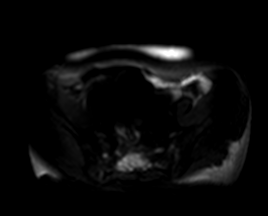
[im 45/45]
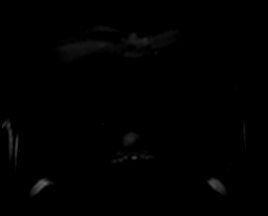

[Series 7: DWI · axial · 6.0mm · 1.38mm/px · z∈[-171,+146]mm · 2 of 45 slices shown (3 of 4)]
[im 1/45]
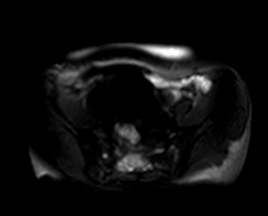
[im 45/45]
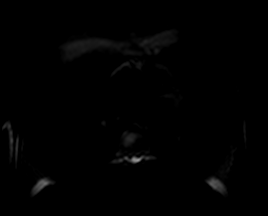

[Series 8: DWI · axial · 6.0mm · 1.38mm/px · z∈[-171,+146]mm · 2 of 45 slices shown (4 of 4)]
[im 1/45]
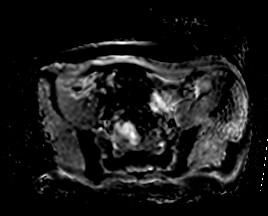
[im 45/45]
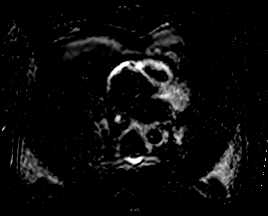

[Series 9: bSSFP · axial · 6.0mm · 0.72mm/px · 1 of 37 slices shown]
[im 1/37]
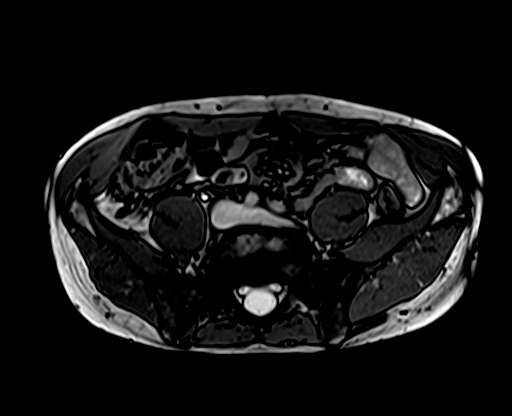

[Series 10: ax in and · axial · 3.0mm · 1.19mm/px · z∈[-143,+118]mm · 3 of 88 slices shown (1 of 2)]
[im 1/88]
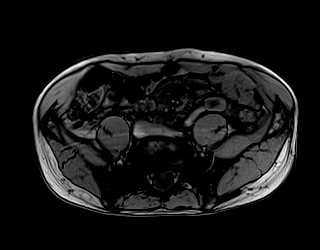
[im 44/88]
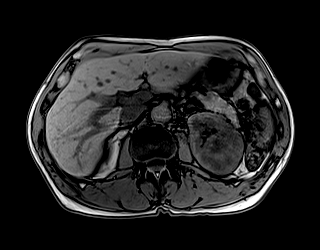
[im 88/88]
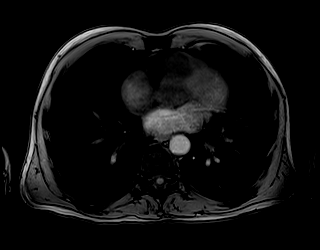

[Series 11: ax in and · axial · 3.0mm · 1.19mm/px · z∈[-143,+118]mm · 3 of 88 slices shown (2 of 2)]
[im 1/88]
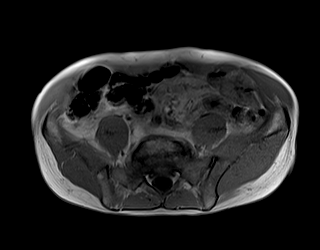
[im 44/88]
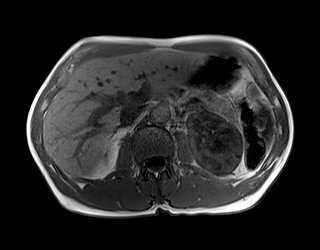
[im 88/88]
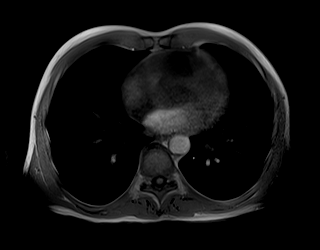

[Series 12: T1 dynamic · axial · 3.0mm · 1.19mm/px · z∈[-143,+118]mm · 3 of 88 slices shown]
[im 1/88]
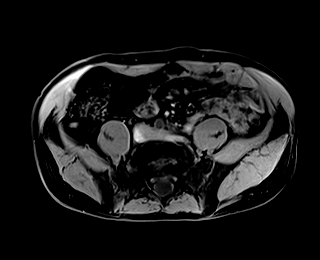
[im 44/88]
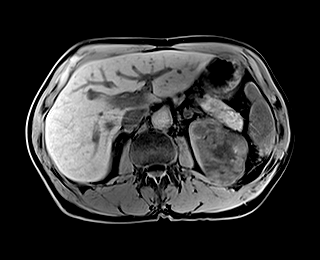
[im 88/88]
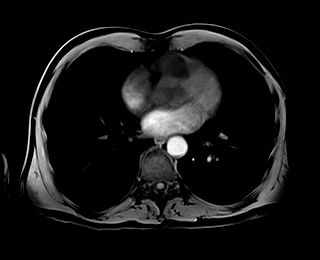

[Series 13: T1 dynamic post-contrast · axial · 3.0mm · 1.19mm/px · z∈[-143,+118]mm · 3 of 88 slices shown (1 of 9)]
[im 1/88]
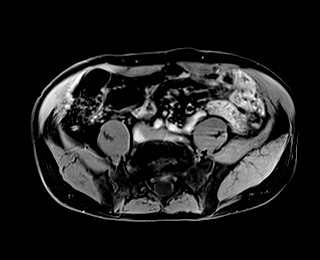
[im 44/88]
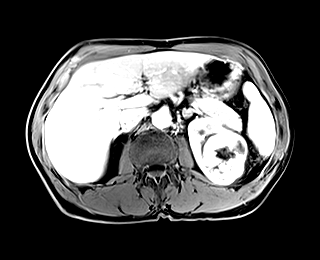
[im 88/88]
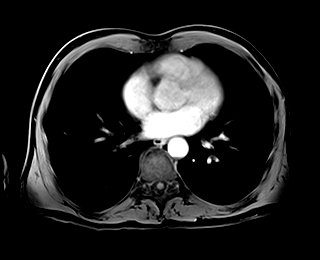

[Series 14: T1 dynamic post-contrast · axial · 3.0mm · 1.19mm/px · z∈[-143,+118]mm · 3 of 88 slices shown (2 of 9)]
[im 1/88]
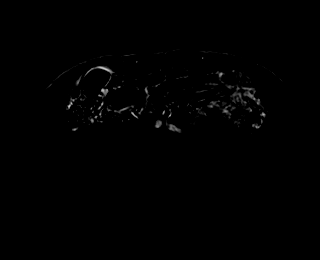
[im 44/88]
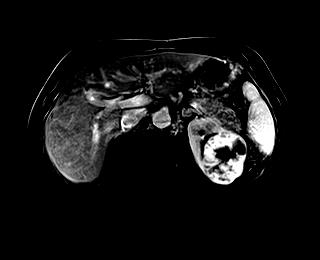
[im 88/88]
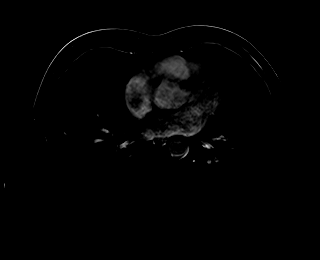

[Series 15: T1 dynamic post-contrast · axial · 3.0mm · 1.19mm/px · z∈[-143,+118]mm · 3 of 88 slices shown (3 of 9)]
[im 1/88]
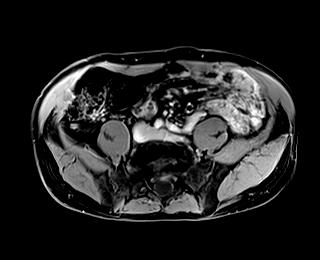
[im 44/88]
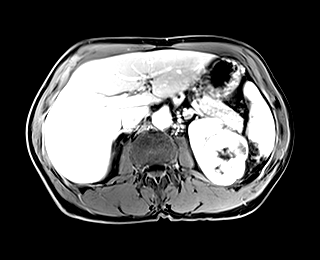
[im 88/88]
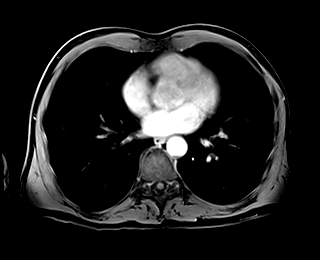

[Series 16: T1 dynamic post-contrast · axial · 3.0mm · 1.19mm/px · z∈[-143,+118]mm · 3 of 88 slices shown (4 of 9)]
[im 1/88]
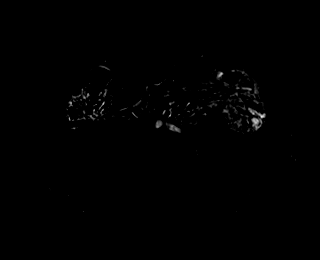
[im 44/88]
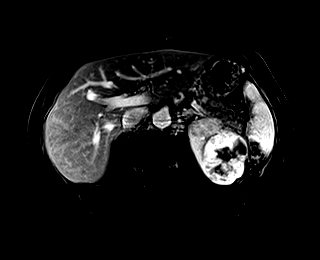
[im 88/88]
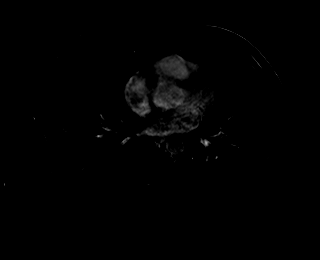

[Series 17: T1 dynamic post-contrast · axial · 3.0mm · 1.19mm/px · z∈[-143,+118]mm · 3 of 88 slices shown (5 of 9)]
[im 1/88]
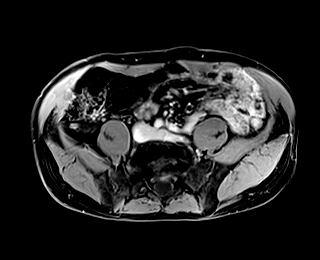
[im 44/88]
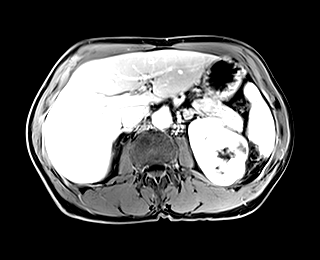
[im 88/88]
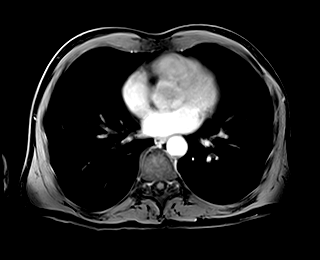

[Series 18: T1 dynamic post-contrast · axial · 3.0mm · 1.19mm/px · z∈[-143,+118]mm · 3 of 88 slices shown (6 of 9)]
[im 1/88]
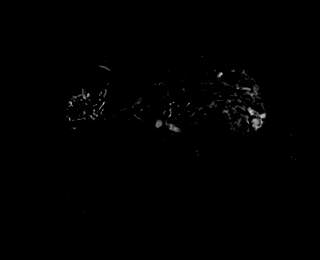
[im 44/88]
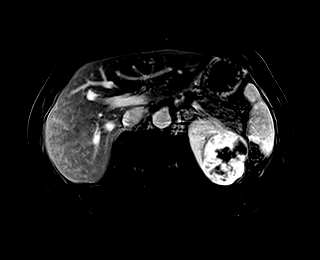
[im 88/88]
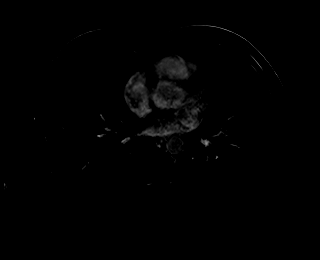

[Series 19: T1 dynamic post-contrast · coronal · 3.0mm · 1.25mm/px · 3 of 72 slices shown (7 of 9)]
[im 1/72]
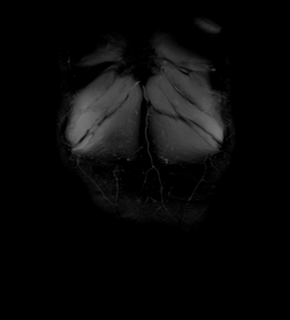
[im 36/72]
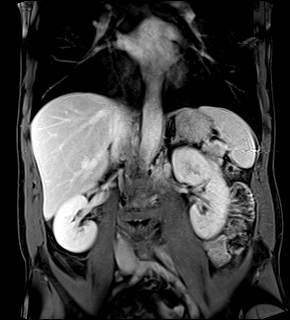
[im 72/72]
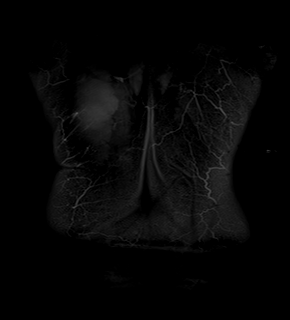

[Series 20: T1 dynamic post-contrast · axial · 3.0mm · 1.19mm/px · z∈[-143,+118]mm · 3 of 88 slices shown (8 of 9)]
[im 1/88]
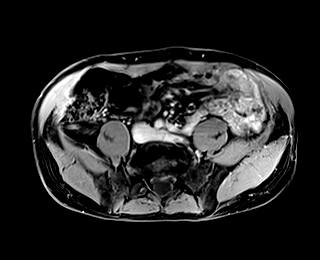
[im 44/88]
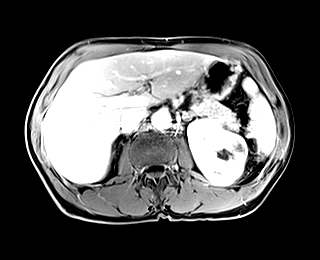
[im 88/88]
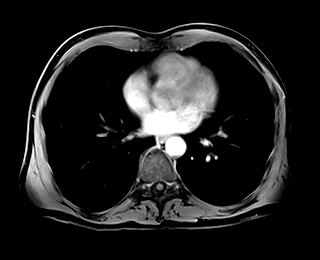

[Series 21: T1 dynamic post-contrast · axial · 3.0mm · 1.19mm/px · z∈[-143,+118]mm · 3 of 88 slices shown (9 of 9)]
[im 1/88]
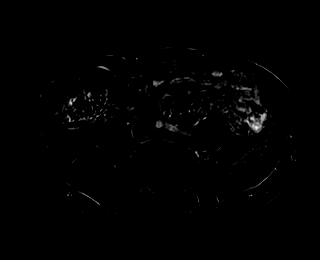
[im 44/88]
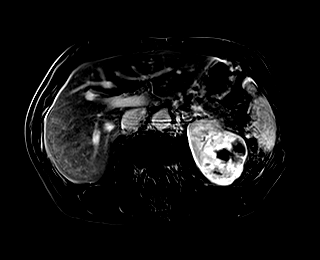
[im 88/88]
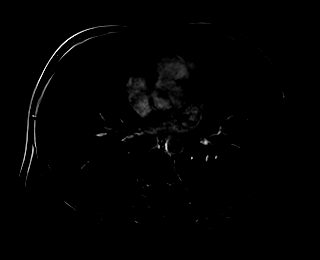

[48 of 48 positions shown; findings below may reference images not displayed]

FINDINGS: Despite efforts by the technologist and patient, motion artifact is
present on today's exam and could not be eliminated. This reduces
exam sensitivity and specificity.

Lower chest: Mild cardiomegaly.

Hepatobiliary: Unremarkable

Pancreas:  Unremarkable

Spleen:  Unremarkable

Adrenals/Urinary Tract: Heterogeneously enhancing mass of the left
kidney upper pole measures 6.5 by 5.4 by 5.8 cm on image 46 series
17. No substantial fatty content.

1.3 by 1.4 cm mass of the left adrenal gland on image 41 series 13
has indeterminate characteristics by MRI, however this mass appears
to of been partially included on the noncontrast CT of 08/31/2021
with internal density of about 2 Hounsfield units which would tend
to favor adenoma. However the lesion is not fully included on that
exam.

No other renal lesions are observed.

Stomach/Bowel: Unremarkable

Vascular/Lymphatic: I do not see clot in the left renal vein. There
does appear to be some collateral venous prominence or possibly some
scattered small lymph nodes in the left periaortic region at the
level of the renal vein on image 53 of series 17. The largest of
these measures 1.1 cm in short axis on image 49 of series 17.
Abdominal aortic atherosclerosis is noted.

Other:  No supplemental non-categorized findings.

Musculoskeletal: Bony findings are present which in this clinical
context are suspicious for osseous metastatic disease. In the L3
vertebral body, a 1.9 cm enhancing lesion on image 41 of series 19
is present along the inferior endplate, possibly violating the
inferior endplate, and associated with surrounding marrow
enhancement. On image 54 of series 19, we partially visualize a
sacral lesion potentially measuring up to 3.8 cm in length or even
more, concerning for metastatic focus. There is abnormal inferior
endplate edema at the T8 vertebral level on image 45 of series 19
which is nonspecific but which could also be a harbinger of an
underlying metastatic lesion. Other small foci enhancement in the
bilateral sacral ala, left iliac bone, and right L2 vertebral body
are nonspecific but could also be metastatic.
IMPRESSION: 1. 6.5 cm left kidney upper pole solid enhancing renal mass favoring
renal cell carcinoma. Scattered enhancing bony lesions suspicious
for osseous metastatic disease, largest at L3 and in the central
sacrum.
2. 1.3 by 1.4 cm mass of the left adrenal gland has indeterminate
characteristics by MRI, but on the CT chest seem to have a low
density in its visualized portion, which would tend to favor
adenoma. However, this lesion was not completely included on the
noncontrast CT examination.
3. Venous collateral vessels versus clustered left periaortic lymph
nodes at the level of the renal mass. Largest of these is about
cm in short axis, potentially reflecting early pathologic
adenopathy. I do not see tumor thrombus in the left renal vein.
4. Mild cardiomegaly.
5.  Aortic Atherosclerosis ([id number]).
